# Patient Record
Sex: Male | Born: 1950 | Race: White | Hispanic: No | Marital: Married | Smoking: Former smoker
Health system: Southern US, Community
[De-identification: ages and names within clinical notes are randomized; demographics above are authoritative.]

## PROBLEM LIST (undated history)

## (undated) DIAGNOSIS — Z8674 Personal history of sudden cardiac arrest: Secondary | ICD-10-CM

## (undated) DIAGNOSIS — R7303 Prediabetes: Secondary | ICD-10-CM

## (undated) DIAGNOSIS — Z8669 Personal history of other diseases of the nervous system and sense organs: Secondary | ICD-10-CM

## (undated) DIAGNOSIS — N433 Hydrocele, unspecified: Secondary | ICD-10-CM

## (undated) DIAGNOSIS — I10 Essential (primary) hypertension: Secondary | ICD-10-CM

## (undated) DIAGNOSIS — F1011 Alcohol abuse, in remission: Secondary | ICD-10-CM

## (undated) DIAGNOSIS — G4733 Obstructive sleep apnea (adult) (pediatric): Secondary | ICD-10-CM

## (undated) DIAGNOSIS — K4090 Unilateral inguinal hernia, without obstruction or gangrene, not specified as recurrent: Secondary | ICD-10-CM

## (undated) DIAGNOSIS — Z95810 Presence of automatic (implantable) cardiac defibrillator: Secondary | ICD-10-CM

## (undated) DIAGNOSIS — I213 ST elevation (STEMI) myocardial infarction of unspecified site: Secondary | ICD-10-CM

## (undated) DIAGNOSIS — E8809 Other disorders of plasma-protein metabolism, not elsewhere classified: Secondary | ICD-10-CM

## (undated) DIAGNOSIS — I428 Other cardiomyopathies: Secondary | ICD-10-CM

## (undated) DIAGNOSIS — I252 Old myocardial infarction: Secondary | ICD-10-CM

## (undated) DIAGNOSIS — I219 Acute myocardial infarction, unspecified: Secondary | ICD-10-CM

## (undated) DIAGNOSIS — I5022 Chronic systolic (congestive) heart failure: Secondary | ICD-10-CM

## (undated) DIAGNOSIS — K219 Gastro-esophageal reflux disease without esophagitis: Secondary | ICD-10-CM

## (undated) HISTORY — PX: COLONOSCOPY: SHX174

## (undated) HISTORY — DX: Personal history of sudden cardiac arrest: Z86.74

## (undated) HISTORY — PX: TOTAL KNEE ARTHROPLASTY: SHX125

## (undated) HISTORY — DX: Gastro-esophageal reflux disease without esophagitis: K21.9

## (undated) HISTORY — DX: Other disorders of plasma-protein metabolism, not elsewhere classified: E88.09

## (undated) HISTORY — DX: ST elevation (STEMI) myocardial infarction of unspecified site: I21.3

---
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
## 1981-11-07 HISTORY — PX: MENISCUS REPAIR: SHX5179

## 2015-11-08 HISTORY — PX: CATARACT EXTRACTION W/ INTRAOCULAR LENS IMPLANT: SHX1309

## 2017-06-26 ENCOUNTER — Inpatient Hospital Stay (HOSPITAL_COMMUNITY)
Admission: EM | Admit: 2017-06-26 | Discharge: 2017-07-05 | DRG: 222 | Disposition: A | Discharge status: Rehabilitation Facility | Attending: Cardiovascular Disease | Admitting: Cardiovascular Disease

## 2017-06-26 ENCOUNTER — Inpatient Hospital Stay (HOSPITAL_COMMUNITY)
Admission: EM | Disposition: A | Discharge status: Rehabilitation Facility | Payer: Self-pay | Source: Home / Self Care | Attending: Cardiovascular Disease

## 2017-06-26 ENCOUNTER — Emergency Department (HOSPITAL_COMMUNITY)

## 2017-06-26 ENCOUNTER — Encounter (HOSPITAL_COMMUNITY): Payer: Self-pay | Admitting: Emergency Medicine

## 2017-06-26 DIAGNOSIS — R2689 Other abnormalities of gait and mobility: Secondary | ICD-10-CM | POA: Diagnosis present

## 2017-06-26 DIAGNOSIS — I2119 ST elevation (STEMI) myocardial infarction involving other coronary artery of inferior wall: Principal | ICD-10-CM | POA: Diagnosis present

## 2017-06-26 DIAGNOSIS — R7303 Prediabetes: Secondary | ICD-10-CM

## 2017-06-26 DIAGNOSIS — B9561 Methicillin susceptible Staphylococcus aureus infection as the cause of diseases classified elsewhere: Secondary | ICD-10-CM | POA: Diagnosis present

## 2017-06-26 DIAGNOSIS — I429 Cardiomyopathy, unspecified: Secondary | ICD-10-CM | POA: Diagnosis present

## 2017-06-26 DIAGNOSIS — I5021 Acute systolic (congestive) heart failure: Secondary | ICD-10-CM

## 2017-06-26 DIAGNOSIS — Z79899 Other long term (current) drug therapy: Secondary | ICD-10-CM | POA: Diagnosis not present

## 2017-06-26 DIAGNOSIS — R140 Abdominal distension (gaseous): Secondary | ICD-10-CM | POA: Diagnosis not present

## 2017-06-26 DIAGNOSIS — J029 Acute pharyngitis, unspecified: Secondary | ICD-10-CM | POA: Diagnosis present

## 2017-06-26 DIAGNOSIS — I428 Other cardiomyopathies: Secondary | ICD-10-CM | POA: Diagnosis not present

## 2017-06-26 DIAGNOSIS — J1520 Pneumonia due to staphylococcus, unspecified: Secondary | ICD-10-CM | POA: Diagnosis present

## 2017-06-26 DIAGNOSIS — I472 Ventricular tachycardia: Secondary | ICD-10-CM | POA: Diagnosis present

## 2017-06-26 DIAGNOSIS — G9340 Encephalopathy, unspecified: Secondary | ICD-10-CM | POA: Diagnosis not present

## 2017-06-26 DIAGNOSIS — J9601 Acute respiratory failure with hypoxia: Secondary | ICD-10-CM

## 2017-06-26 DIAGNOSIS — I469 Cardiac arrest, cause unspecified: Secondary | ICD-10-CM | POA: Diagnosis present

## 2017-06-26 DIAGNOSIS — Z9911 Dependence on respirator [ventilator] status: Secondary | ICD-10-CM

## 2017-06-26 DIAGNOSIS — I462 Cardiac arrest due to underlying cardiac condition: Secondary | ICD-10-CM | POA: Diagnosis present

## 2017-06-26 DIAGNOSIS — N179 Acute kidney failure, unspecified: Secondary | ICD-10-CM | POA: Diagnosis present

## 2017-06-26 DIAGNOSIS — R451 Restlessness and agitation: Secondary | ICD-10-CM

## 2017-06-26 DIAGNOSIS — E349 Endocrine disorder, unspecified: Secondary | ICD-10-CM

## 2017-06-26 DIAGNOSIS — F17290 Nicotine dependence, other tobacco product, uncomplicated: Secondary | ICD-10-CM | POA: Diagnosis present

## 2017-06-26 DIAGNOSIS — I4901 Ventricular fibrillation: Secondary | ICD-10-CM | POA: Diagnosis present

## 2017-06-26 DIAGNOSIS — I213 ST elevation (STEMI) myocardial infarction of unspecified site: Secondary | ICD-10-CM | POA: Diagnosis not present

## 2017-06-26 DIAGNOSIS — E8809 Other disorders of plasma-protein metabolism, not elsewhere classified: Secondary | ICD-10-CM

## 2017-06-26 DIAGNOSIS — I5181 Takotsubo syndrome: Secondary | ICD-10-CM | POA: Diagnosis present

## 2017-06-26 DIAGNOSIS — I119 Hypertensive heart disease without heart failure: Secondary | ICD-10-CM | POA: Diagnosis present

## 2017-06-26 DIAGNOSIS — R739 Hyperglycemia, unspecified: Secondary | ICD-10-CM

## 2017-06-26 DIAGNOSIS — Z0189 Encounter for other specified special examinations: Secondary | ICD-10-CM

## 2017-06-26 DIAGNOSIS — E785 Hyperlipidemia, unspecified: Secondary | ICD-10-CM | POA: Diagnosis present

## 2017-06-26 DIAGNOSIS — G931 Anoxic brain damage, not elsewhere classified: Secondary | ICD-10-CM | POA: Diagnosis present

## 2017-06-26 DIAGNOSIS — D72829 Elevated white blood cell count, unspecified: Secondary | ICD-10-CM

## 2017-06-26 DIAGNOSIS — R4189 Other symptoms and signs involving cognitive functions and awareness: Secondary | ICD-10-CM | POA: Diagnosis present

## 2017-06-26 DIAGNOSIS — Z8249 Family history of ischemic heart disease and other diseases of the circulatory system: Secondary | ICD-10-CM | POA: Diagnosis not present

## 2017-06-26 DIAGNOSIS — I10 Essential (primary) hypertension: Secondary | ICD-10-CM | POA: Diagnosis present

## 2017-06-26 DIAGNOSIS — Z800 Family history of malignant neoplasm of digestive organs: Secondary | ICD-10-CM

## 2017-06-26 DIAGNOSIS — I5022 Chronic systolic (congestive) heart failure: Secondary | ICD-10-CM

## 2017-06-26 DIAGNOSIS — R9401 Abnormal electroencephalogram [EEG]: Secondary | ICD-10-CM | POA: Diagnosis present

## 2017-06-26 DIAGNOSIS — Z8674 Personal history of sudden cardiac arrest: Secondary | ICD-10-CM

## 2017-06-26 DIAGNOSIS — Z833 Family history of diabetes mellitus: Secondary | ICD-10-CM | POA: Diagnosis not present

## 2017-06-26 DIAGNOSIS — E876 Hypokalemia: Secondary | ICD-10-CM | POA: Diagnosis present

## 2017-06-26 DIAGNOSIS — J150 Pneumonia due to Klebsiella pneumoniae: Secondary | ICD-10-CM | POA: Diagnosis not present

## 2017-06-26 DIAGNOSIS — J189 Pneumonia, unspecified organism: Secondary | ICD-10-CM | POA: Diagnosis not present

## 2017-06-26 DIAGNOSIS — Y92230 Patient room in hospital as the place of occurrence of the external cause: Secondary | ICD-10-CM | POA: Diagnosis not present

## 2017-06-26 DIAGNOSIS — R001 Bradycardia, unspecified: Secondary | ICD-10-CM | POA: Diagnosis not present

## 2017-06-26 DIAGNOSIS — Z452 Encounter for adjustment and management of vascular access device: Secondary | ICD-10-CM

## 2017-06-26 DIAGNOSIS — I252 Old myocardial infarction: Secondary | ICD-10-CM

## 2017-06-26 DIAGNOSIS — I959 Hypotension, unspecified: Secondary | ICD-10-CM | POA: Diagnosis present

## 2017-06-26 DIAGNOSIS — G629 Polyneuropathy, unspecified: Secondary | ICD-10-CM

## 2017-06-26 DIAGNOSIS — Z95810 Presence of automatic (implantable) cardiac defibrillator: Secondary | ICD-10-CM | POA: Diagnosis not present

## 2017-06-26 DIAGNOSIS — R680 Hypothermia, not associated with low environmental temperature: Secondary | ICD-10-CM | POA: Diagnosis not present

## 2017-06-26 DIAGNOSIS — Y95 Nosocomial condition: Secondary | ICD-10-CM | POA: Diagnosis present

## 2017-06-26 DIAGNOSIS — J020 Streptococcal pharyngitis: Secondary | ICD-10-CM | POA: Diagnosis not present

## 2017-06-26 DIAGNOSIS — R5381 Other malaise: Secondary | ICD-10-CM | POA: Diagnosis present

## 2017-06-26 DIAGNOSIS — K219 Gastro-esophageal reflux disease without esophagitis: Secondary | ICD-10-CM | POA: Diagnosis present

## 2017-06-26 DIAGNOSIS — I6782 Cerebral ischemia: Secondary | ICD-10-CM | POA: Diagnosis not present

## 2017-06-26 DIAGNOSIS — Z959 Presence of cardiac and vascular implant and graft, unspecified: Secondary | ICD-10-CM

## 2017-06-26 DIAGNOSIS — E878 Other disorders of electrolyte and fluid balance, not elsewhere classified: Secondary | ICD-10-CM | POA: Diagnosis present

## 2017-06-26 DIAGNOSIS — T50995A Adverse effect of other drugs, medicaments and biological substances, initial encounter: Secondary | ICD-10-CM | POA: Diagnosis not present

## 2017-06-26 HISTORY — DX: Personal history of sudden cardiac arrest: Z86.74

## 2017-06-26 HISTORY — PX: LEFT HEART CATH AND CORONARY ANGIOGRAPHY: CATH118249

## 2017-06-26 HISTORY — DX: Old myocardial infarction: I25.2

## 2017-06-26 HISTORY — DX: Other disorders of plasma-protein metabolism, not elsewhere classified: E88.09

## 2017-06-26 HISTORY — DX: Ventricular fibrillation: I49.01

## 2017-06-26 HISTORY — DX: Monoclonal gammopathy: E34.9

## 2017-06-26 HISTORY — DX: Cardiac arrest, cause unspecified: I46.9

## 2017-06-26 HISTORY — DX: Essential (primary) hypertension: I10

## 2017-06-26 LAB — SAMPLE TO BLOOD BANK

## 2017-06-26 LAB — LIPID PANEL
CHOL/HDL RATIO: 2.5 ratio
Cholesterol: 142 mg/dL (ref 0–200)
HDL: 56 mg/dL (ref >40)
LDL Cholesterol: UNDETERMINED mg/dL (ref 0–99)
Triglycerides: 505 mg/dL — ABNORMAL HIGH (ref <150)
VLDL: UNDETERMINED mg/dL (ref 0–40)

## 2017-06-26 LAB — CBC WITH DIFFERENTIAL/PLATELET
BASOS PCT: 0 %
Basophils Absolute: 0 10*3/uL (ref 0.0–0.1)
Eosinophils Absolute: 0.1 10*3/uL (ref 0.0–0.7)
Eosinophils Relative: 1 %
HEMATOCRIT: 45.4 % (ref 39.0–52.0)
HEMOGLOBIN: 14.9 g/dL (ref 13.0–17.0)
Lymphocytes Relative: 31 %
Lymphs Abs: 3.9 10*3/uL (ref 0.7–4.0)
MCH: 29.1 pg (ref 26.0–34.0)
MCHC: 32.8 g/dL (ref 30.0–36.0)
MCV: 88.7 fL (ref 78.0–100.0)
MONOS PCT: 7 %
Monocytes Absolute: 0.9 10*3/uL (ref 0.1–1.0)
NEUTROS ABS: 7.5 10*3/uL (ref 1.7–7.7)
NEUTROS PCT: 61 %
Platelets: 242 10*3/uL (ref 150–400)
RBC: 5.12 MIL/uL (ref 4.22–5.81)
RDW: 13.4 % (ref 11.5–15.5)
WBC: 12.4 10*3/uL — ABNORMAL HIGH (ref 4.0–10.5)

## 2017-06-26 LAB — APTT: APTT: 20 s — AB (ref 24–36)

## 2017-06-26 LAB — COMPREHENSIVE METABOLIC PANEL
ALBUMIN: 3.6 g/dL (ref 3.5–5.0)
ALT: 134 U/L — ABNORMAL HIGH (ref 17–63)
AST: 218 U/L — AB (ref 15–41)
Alkaline Phosphatase: 75 U/L (ref 38–126)
Anion gap: 22 — ABNORMAL HIGH (ref 5–15)
BUN: 22 mg/dL — AB (ref 6–20)
CHLORIDE: 98 mmol/L — AB (ref 101–111)
CO2: 17 mmol/L — ABNORMAL LOW (ref 22–32)
Calcium: 8 mg/dL — ABNORMAL LOW (ref 8.9–10.3)
Creatinine, Ser: 1.6 mg/dL — ABNORMAL HIGH (ref 0.61–1.24)
GFR calc Af Amer: 51 mL/min — ABNORMAL LOW (ref >60)
GFR calc non Af Amer: 44 mL/min — ABNORMAL LOW (ref >60)
GLUCOSE: 254 mg/dL — AB (ref 65–99)
POTASSIUM: 3.4 mmol/L — AB (ref 3.5–5.1)
SODIUM: 137 mmol/L (ref 135–145)
Total Bilirubin: 1 mg/dL (ref 0.3–1.2)
Total Protein: 5.9 g/dL — ABNORMAL LOW (ref 6.5–8.1)

## 2017-06-26 LAB — I-STAT CG4 LACTIC ACID, ED: LACTIC ACID, VENOUS: 11.42 mmol/L — AB (ref 0.5–1.9)

## 2017-06-26 LAB — POCT I-STAT 3, ART BLOOD GAS (G3+)
Acid-base deficit: 1 mmol/L (ref 0.0–2.0)
BICARBONATE: 24.2 mmol/L (ref 20.0–28.0)
O2 Saturation: 99 %
PCO2 ART: 34.2 mmHg (ref 32.0–48.0)
Patient temperature: 90
TCO2: 25 mmol/L (ref 0–100)
pH, Arterial: 7.436 (ref 7.350–7.450)
pO2, Arterial: 109 mmHg — ABNORMAL HIGH (ref 83.0–108.0)

## 2017-06-26 LAB — I-STAT CHEM 8, ED
BUN: 24 mg/dL — AB (ref 6–20)
CALCIUM ION: 0.91 mmol/L — AB (ref 1.15–1.40)
Chloride: 99 mmol/L — ABNORMAL LOW (ref 101–111)
Creatinine, Ser: 1.4 mg/dL — ABNORMAL HIGH (ref 0.61–1.24)
GLUCOSE: 251 mg/dL — AB (ref 65–99)
HCT: 46 % (ref 39.0–52.0)
Hemoglobin: 15.6 g/dL (ref 13.0–17.0)
Potassium: 3.1 mmol/L — ABNORMAL LOW (ref 3.5–5.1)
SODIUM: 136 mmol/L (ref 135–145)
TCO2: 18 mmol/L (ref 0–100)

## 2017-06-26 LAB — I-STAT TROPONIN, ED: Troponin i, poc: 0.16 ng/mL (ref 0.00–0.08)

## 2017-06-26 LAB — PROTIME-INR
INR: 1.14
Prothrombin Time: 14.6 seconds (ref 11.4–15.2)

## 2017-06-26 LAB — POCT ACTIVATED CLOTTING TIME: ACTIVATED CLOTTING TIME: 98 s

## 2017-06-26 LAB — TROPONIN I: Troponin I: 0.13 ng/mL (ref <0.03)

## 2017-06-26 SURGERY — LEFT HEART CATH AND CORONARY ANGIOGRAPHY
Anesthesia: LOCAL

## 2017-06-26 MED ORDER — CISATRACURIUM BOLUS VIA INFUSION
0.1000 mg/kg | Freq: Once | INTRAVENOUS | Status: AC
  Administered 2017-06-26: 7 mg via INTRAVENOUS
  Filled 2017-06-26: qty 7

## 2017-06-26 MED ORDER — ASPIRIN 300 MG RE SUPP: 300.0000 mg | RECTAL | Status: AC

## 2017-06-26 MED ORDER — HEPARIN SODIUM (PORCINE) 5000 UNIT/ML IJ SOLN
4000.0000 [IU] | Freq: Once | INTRAMUSCULAR | Status: AC
  Administered 2017-06-26: 4000 [IU] via INTRAVENOUS
  Filled 2017-06-26: qty 1

## 2017-06-26 MED ORDER — NOREPINEPHRINE BITARTRATE 1 MG/ML IV SOLN
0.0000 ug/min | INTRAVENOUS | Status: DC
  Administered 2017-06-27: 2 ug/min via INTRAVENOUS
  Filled 2017-06-26: qty 4

## 2017-06-26 MED ORDER — ARTIFICIAL TEARS OPHTHALMIC OINT
1.0000 "application " | TOPICAL_OINTMENT | Freq: Three times a day (TID) | OPHTHALMIC | Status: DC
  Administered 2017-06-27 – 2017-06-28 (×5): 1 via OPHTHALMIC
  Filled 2017-06-26 (×2): qty 3.5

## 2017-06-26 MED ORDER — PROPOFOL 1000 MG/100ML IV EMUL
INTRAVENOUS | Status: AC
  Filled 2017-06-26: qty 100

## 2017-06-26 MED ORDER — ASPIRIN 300 MG RE SUPP
300.0000 mg | Freq: Once | RECTAL | Status: AC
  Administered 2017-06-26: 300 mg via RECTAL

## 2017-06-26 MED ORDER — FENTANYL BOLUS VIA INFUSION
50.0000 ug | INTRAVENOUS | Status: DC | PRN
Start: 2017-06-26 — End: 2017-06-28
  Filled 2017-06-26: qty 50

## 2017-06-26 MED ORDER — AMIODARONE HCL IN DEXTROSE 360-4.14 MG/200ML-% IV SOLN
60.0000 mg/h | INTRAVENOUS | Status: AC
  Administered 2017-06-27: 60 mg/h via INTRAVENOUS
  Filled 2017-06-26: qty 200

## 2017-06-26 MED ORDER — CISATRACURIUM BOLUS VIA INFUSION
0.0500 mg/kg | INTRAVENOUS | Status: DC | PRN
  Filled 2017-06-26: qty 4

## 2017-06-26 MED ORDER — PANTOPRAZOLE SODIUM 40 MG IV SOLR
40.0000 mg | Freq: Every day | INTRAVENOUS | Status: DC
  Administered 2017-06-27 – 2017-07-01 (×5): 40 mg via INTRAVENOUS
  Filled 2017-06-26 (×5): qty 40

## 2017-06-26 MED ORDER — IOPAMIDOL (ISOVUE-370) INJECTION 76%
INTRAVENOUS | Status: DC | PRN
  Administered 2017-06-26: 100 mL via INTRA_ARTERIAL

## 2017-06-26 MED ORDER — FENTANYL 2500MCG IN NS 250ML (10MCG/ML) PREMIX INFUSION
100.0000 ug/h | INTRAVENOUS | Status: DC
  Administered 2017-06-26: 100 ug/h via INTRAVENOUS
  Administered 2017-06-27 – 2017-06-28 (×4): 300 ug/h via INTRAVENOUS
  Filled 2017-06-26 (×5): qty 250

## 2017-06-26 MED ORDER — HEPARIN (PORCINE) IN NACL 2-0.9 UNIT/ML-% IJ SOLN
INTRAMUSCULAR | Status: AC
  Filled 2017-06-26: qty 1000

## 2017-06-26 MED ORDER — SODIUM CHLORIDE 0.9 % IV SOLN
1.0000 g | Freq: Once | INTRAVENOUS | Status: AC
  Administered 2017-06-27: 1 g via INTRAVENOUS
  Filled 2017-06-26 (×2): qty 10

## 2017-06-26 MED ORDER — MIDAZOLAM HCL 2 MG/2ML IJ SOLN
2.0000 mg | Freq: Once | INTRAMUSCULAR | Status: AC
  Administered 2017-06-28: 2 mg via INTRAVENOUS
  Filled 2017-06-26: qty 2

## 2017-06-26 MED ORDER — IOPAMIDOL (ISOVUE-370) INJECTION 76%
INTRAVENOUS | Status: AC
  Filled 2017-06-26: qty 125

## 2017-06-26 MED ORDER — LIDOCAINE HCL (PF) 1 % IJ SOLN
INTRAMUSCULAR | Status: DC | PRN
  Administered 2017-06-26: 15 mL via SUBCUTANEOUS

## 2017-06-26 MED ORDER — SODIUM CHLORIDE 0.9 % IV SOLN
INTRAVENOUS | Status: DC
  Administered 2017-06-26 – 2017-06-27 (×2): via INTRAVENOUS

## 2017-06-26 MED ORDER — SODIUM CHLORIDE 0.9 % IV SOLN
1.0000 ug/kg/min | INTRAVENOUS | Status: DC
  Administered 2017-06-26: 1 ug/kg/min via INTRAVENOUS
  Filled 2017-06-26: qty 20

## 2017-06-26 MED ORDER — PROPOFOL 1000 MG/100ML IV EMUL
5.0000 ug/kg/min | Freq: Once | INTRAVENOUS | Status: DC
  Administered 2017-06-26: 30 ug/kg/min via INTRAVENOUS

## 2017-06-26 MED ORDER — LIDOCAINE HCL (PF) 1 % IJ SOLN
INTRAMUSCULAR | Status: AC
  Filled 2017-06-26: qty 30

## 2017-06-26 MED ORDER — MIDAZOLAM HCL 50 MG/10ML IJ SOLN
2.0000 mg/h | INTRAMUSCULAR | Status: DC
  Administered 2017-06-26 – 2017-06-28 (×5): 5 mg/h via INTRAVENOUS
  Filled 2017-06-26 (×5): qty 10

## 2017-06-26 MED ORDER — AMIODARONE LOAD VIA INFUSION
150.0000 mg | Freq: Once | INTRAVENOUS | Status: DC
  Filled 2017-06-26: qty 83.34

## 2017-06-26 MED ORDER — FENTANYL CITRATE (PF) 100 MCG/2ML IJ SOLN: 100.0000 ug | Freq: Once | INTRAMUSCULAR | Status: DC

## 2017-06-26 MED ORDER — POTASSIUM CHLORIDE 10 MEQ/100ML IV SOLN
10.0000 meq | INTRAVENOUS | Status: AC
  Administered 2017-06-27 (×2): 10 meq via INTRAVENOUS
  Filled 2017-06-26 (×2): qty 100

## 2017-06-26 MED ORDER — AMIODARONE HCL IN DEXTROSE 360-4.14 MG/200ML-% IV SOLN
30.0000 mg/h | INTRAVENOUS | Status: DC
  Administered 2017-06-27 – 2017-06-28 (×2): 30 mg/h via INTRAVENOUS
  Filled 2017-06-26 (×3): qty 200

## 2017-06-26 MED ORDER — MIDAZOLAM BOLUS VIA INFUSION
2.0000 mg | INTRAVENOUS | Status: DC | PRN
  Filled 2017-06-26: qty 2

## 2017-06-26 SURGICAL SUPPLY — 12 items
CATH INFINITI 5FR MULTPACK ANG (CATHETERS) ×2 IMPLANT
GLIDESHEATH SLEND SS 6F .021 (SHEATH) IMPLANT
GUIDEWIRE INQWIRE 1.5J.035X260 (WIRE) IMPLANT
INQWIRE 1.5J .035X260CM (WIRE)
KIT ENCORE 26 ADVANTAGE (KITS) ×2 IMPLANT
KIT HEART LEFT (KITS) ×2 IMPLANT
PACK CARDIAC CATHETERIZATION (CUSTOM PROCEDURE TRAY) ×2 IMPLANT
SHEATH PINNACLE 6F 10CM (SHEATH) ×2 IMPLANT
SYR MEDRAD MARK V 150ML (SYRINGE) ×2 IMPLANT
TRANSDUCER W/STOPCOCK (MISCELLANEOUS) ×2 IMPLANT
TUBING CIL FLEX 10 FLL-RA (TUBING) ×2 IMPLANT
WIRE EMERALD 3MM-J .035X150CM (WIRE) ×2 IMPLANT

## 2017-06-26 NOTE — ED Notes (Signed)
Per wife hx of HTN, non smoker.

## 2017-06-26 NOTE — Progress Notes (Signed)
eLink Physician-Brief Progress Note Patient Name: MANASSEH PITTSLEY DOB: [birth year] MRN: [id number]   Date of Service  06/26/2017  HPI/Events of Note  Arrest 20 min , VT Lactic concerning Unsure if any purposeful movements clinically  stemi To lab Unable to camera in  pccm saw Cooling candidate D/w 2900 Rn to start cooling simultaneous   eICU Interventions  Orders placed     Intervention Category Evaluation Type: New Patient Evaluation  Raylene Miyamoto. 06/26/2017, 10:14 PM

## 2017-06-26 NOTE — ED Notes (Signed)
Arrives as a code stemi from home, pt went to bed early c/o cp. Wife called 911 after hearing a "suspicious sound" and found him with agonal respirations unresponsive in bed. Fire shocked once with AED, upon EMS arrival pt in PEA at a rate of 20. Received CPR for 20 minutes on and off. VFIB noted, shocked once at 300 joules. Then vtach without pulses shocked once at 360 joules. Pulses back in sinus tach. 7.0 ETT. Recieved 5 epis and 5mg  versed in the field.

## 2017-06-26 NOTE — ED Notes (Signed)
Cath lab ready; awaiting RT for transport

## 2017-06-26 NOTE — H&P (Signed)
Cardiology Admission History and Physical:   Patient ID: XZAVIEN HARADA; MRN: [id number]; DOB: 31-Oct-1951   Admission date: 06/26/2017  Primary Care Provider: No primary care provider on file. Primary Cardiologist: New  Chief Complaint:  Cardiac arrest  Patient Profile:   Jeffery Thomas is a 66 y.o. male with a history of HTN, hyperlipidemia, and GERD had chest pain then cardiac arrest.   History of Present Illness:   Jeffery Thomas has no prior cardiac history.  He has treated hypertension, hyperlipidemia, and GERD.  He was in his usual state of health until after dinner this evening.  He reported chest pain to his wife and went back to the bedroom.  She heard him fall, found him down and unresponsive.  EMS was summoned, AED applied and he was shocked.  He was then found to be in PEA and got epinephrine.  He went into vfib again and was shocked, then pulseless VT and was shocked again.  Finally attained perfusing rhythm with sinus tachy after about 20 minutes of CPR and treatment.  In ER currently, SBP in 130s with HR 100s sinus tachy.  He apparently had purposeful movement after ROSC but is now on propofol/sedated.    Per wife, he had not had chest pain prior to this evening.  He does not smoke.   PMH: 1. HTN 2. Hyperlipidemia 3. GERD  No past surgical history on file.   Medications Prior to Admission: Omeprazole 20 Toprol XL 25 daily Atorvastatin 40 daily HCTZ 25 daily Amlodipine 10 daily       Allergies:   No Known Allergies  Social History:   Social History   Social History  . Marital status: N/A    Spouse name: N/A  . Number of children: N/A  . Years of education: N/A   Occupational History  . Not on file.   Social History Main Topics  . Smoking status: Never Smoker  . Smokeless tobacco: Not on file  . Alcohol use Not on file  . Drug use: Unknown  . Sexual activity: Not on file   Other Topics Concern  . Not on file   Social History Narrative  . No  narrative on file    Family History:  CAD  ROS:  All systems reviewed and negative except as per HPI.      Physical Exam/Data:   Vitals:   06/26/17 2146 06/26/17 2151  BP: (!) 133/114   Pulse: (!) 104   Resp: 20   Temp:  (!) 96.3 F (35.7 C)  TempSrc:  Temporal  SpO2: 98%     Intake/Output Summary (Last 24 hours) at 06/26/17 2158 Last data filed at 06/26/17 2140  Gross per 24 hour  Intake              900 ml  Output                0 ml  Net              900 ml   There were no vitals filed for this visit. There is no height or weight on file to calculate BMI.  General:  Intubated/sedated HEENT: normal Lymph: no adenopathy Neck: no JVD Endocrine:  No thryomegaly Vascular: No carotid bruits; FA pulses 2+ bilaterally without bruits  Cardiac:  normal S1, S2; RRR; no murmur  Lungs:  Coarse BS bilaterally Abd: soft, nontender, no hepatomegaly  Ext: no edema Musculoskeletal:  No deformities, BUE and BLE strength normal  and equal Skin: warm and dry  Neuro:  Sedated on vent   EKG:  The ECG that was done was personally reviewed and demonstrates NSR with 1 mm inferior ST elevation and anterolateral T wave inversions  Laboratory Data:  Chemistry Recent Labs Lab 06/26/17 2154  NA 136  K 3.1*  CL 99*  GLUCOSE 251*  BUN 24*  CREATININE 1.40*    No results for input(s): PROT, ALBUMIN, AST, ALT, ALKPHOS, BILITOT in the last 168 hours. Hematology Recent Labs Lab 06/26/17 2154  HGB 15.6  HCT 46.0   Cardiac EnzymesNo results for input(s): TROPONINI in the last 168 hours. No results for input(s): TROPIPOC in the last 168 hours.  BNPNo results for input(s): BNP, PROBNP in the last 168 hours.  DDimer No results for input(s): DDIMER in the last 168 hours.  Radiology/Studies:  No results found.  Assessment and Plan:   66 yo with history of HTN, hyperlipidemia, GERD presents with inferior MI complicated by ventricular fibrillation arrest.  1. Inferior MI: No prior  cardiac history.  He will get ASA per rectum and go directly to cath lab for angiography.  Will need echo.  2. Cardiac arrest: Ventricular fibrillation.  About 20 minutes total CPR with ROSC.  He will be cooled, CCM consulted.     Signed, Loralie Champagne, MD  06/26/2017 9:58 PM

## 2017-06-26 NOTE — ED Notes (Signed)
Scrotum edematous on exam.

## 2017-06-26 NOTE — Progress Notes (Signed)
eLink Physician-Brief Progress Note Patient Name: Jeffery Thomas DOB: 07-Jun-1951 MRN: [id number]   Date of Service  06/26/2017  HPI/Events of Note  Bedside nurse requests A-line and CVL.  eICU Interventions  Will order: 1. Respiratory Therapy to place A-line. 2. Will inform ground team of request for CVL.     Intervention Category Major Interventions: Hypotension - evaluation and management  Yvana Samonte Cornelia Copa 06/26/2017, 11:57 PM

## 2017-06-26 NOTE — ED Notes (Signed)
Artic sun pads applied per cardiologist

## 2017-06-26 NOTE — Consult Note (Signed)
PULMONARY / CRITICAL CARE MEDICINE   Name: Jeffery Thomas MRN: [id number] DOB: 01-19-1951    ADMISSION DATE:  06/26/2017 CONSULTATION DATE:  06/26/17  REFERRING MD:  Aundra Dubin  CHIEF COMPLAINT:  Cardiac Arrest  HISTORY OF PRESENT ILLNESS:  Pt is encephelopathic; therefore, this HPI is obtained from chart review. Jeffery Thomas is a 66 y.o. male with PMH as outlined below. He presented to [facility] ED 8/20 after cardiac arrest.  He was in his usual state of health until he finished dinner.  He then reported chest pain and when he went back to his bedroom, wife heard a noise.  She found him down and unresponsive. EMS was called and pt was initially AED advised to shock.  He was then found to be in PEA.  After epi, he went into v.fib requiring a shock, then VT requiring 3rd shock.  Total ACLS time roughly 20 minutes.  In ED, he was found to have inferior STEMI.  There was question whether pt had purposeful movement after intubation, or whether it was simply pt reaching unpurposefully.  PAST MEDICAL HISTORY :  He  has a past medical history of Hypertension.  PAST SURGICAL HISTORY: He  has no past surgical history on file.  No Known Allergies  No current facility-administered medications on file prior to encounter.    No current outpatient prescriptions on file prior to encounter.    FAMILY HISTORY:  His has no family status information on file.    SOCIAL HISTORY: He  reports that he has never smoked. He does not have any smokeless tobacco history on file.  REVIEW OF SYSTEMS:   Unable to obtain as pt is encephalopathic.  SUBJECTIVE:  On vent, unresponsive.  VITAL SIGNS: BP 90/64   Pulse (!) 102   Temp (!) 96.3 F (35.7 C) (Temporal)   Resp (!) 24   Ht 6\' 2"  (1.88 m)   Wt 70 kg (154 lb 5.2 oz)   SpO2 96%   BMI 19.81 kg/m   HEMODYNAMICS:    VENTILATOR SETTINGS: Vent Mode: PRVC FiO2 (%):  [100 %] 100 % Set Rate:  [15 bmp] 15 bmp Vt Set:  [640 mL] 640 mL PEEP:  [5 cmH20]  5 cmH20  INTAKE / OUTPUT: No intake/output data recorded.   PHYSICAL EXAMINATION: General: Adult male, in NAD. Neuro: Sedated, unresponsive. HEENT: [place]/AT. PERRL, sclerae anicteric. Cardiovascular: RRR, no M/R/G.  Lungs: Respirations even and unlabored.  CTA bilaterally, No W/R/R. Abdomen: BS x 4, soft, NT/ND.  Musculoskeletal: No gross deformities, no edema.  Skin: Intact, warm, no rashes.  LABS:  BMET  Recent Labs Lab 06/26/17 2154  NA 136  K 3.1*  CL 99*  BUN 24*  CREATININE 1.40*  GLUCOSE 251*    Electrolytes No results for input(s): CALCIUM, MG, PHOS in the last 168 hours.  CBC  Recent Labs Lab 06/26/17 2137 06/26/17 2154  WBC 12.4*  --   HGB 14.9 15.6  HCT 45.4 46.0  PLT 242  --     Coag's  Recent Labs Lab 06/26/17 2137  APTT 20*  INR 1.14    Sepsis Markers  Recent Labs Lab 06/26/17 2154  LATICACIDVEN 11.42*    ABG No results for input(s): PHART, PCO2ART, PO2ART in the last 168 hours.  Liver Enzymes No results for input(s): AST, ALT, ALKPHOS, BILITOT, ALBUMIN in the last 168 hours.  Cardiac Enzymes No results for input(s): TROPONINI, PROBNP in the last 168 hours.  Glucose No results for input(s): GLUCAP  in the last 168 hours.  Imaging Dg Chest Portable 1 View  Result Date: 06/26/2017 CLINICAL DATA:  66 y/o  M; post CPR and intubation. EXAM: PORTABLE CHEST 1 VIEW COMPARISON:  None. FINDINGS: Mildly enlarged cardiac silhouette given projection and technique. Endotracheal tube tip is 9.5 cm from the carina. Pulmonary vascular congestion. No pneumothorax. The no focal consolidation. No acute osseous abnormality is evident. IMPRESSION: Mildly enlarged cardiac silhouette. Pulmonary vascular congestion. Endotracheal tube tip 9.5 cm from carina, consider advancement. Electronically Signed   By: Kristine Garbe M.D.   On: 06/26/2017 22:11     STUDIES:  CXR 8/20 > mild congestion. Echo 8/21 >  EEG 8/21 >    CULTURES: None.  ANTIBIOTICS: None.  SIGNIFICANT EVENTS: 8/20 > admit.  LINES/TUBES: ETT 8/20 >  CVL pending 8/20 >  A. Line pending 8/20 >   DISCUSSION: 66 y.o. male admitted 8/20 with inferior STEMI.  Taken to cath lab and started on hypothermia.  ASSESSMENT / PLAN:  PULMONARY A: Respiratory insufficiency - due to inability to protect the airway in the setting of inferior STEMI. P:   Full vent support. Wean as able. VAP prevention measures. Hold SBT until off NMB. CXR in AM.  CARDIOVASCULAR A:  Inferior STEMI - going to cath lab. Hx HTN. P:  Start TTM, goal 33 degrees. Place CVL, arterial line. Levophed PRN, goal MAP > 80 while undergoing TTM. Cardiology following. Trend troponin, lactate. Assess echo, CVP. Continue preadmission atorvastatin. Hold preadmission amlodipine, hydrodiuril, toprol-xl.  RENAL A:   Hypokalemia - anticipate worsening due to cold diuresis. AKI. Hypocalcemia. P:   Continue K repletion. NS @ 75.  BMP q2hrs x 4 then q4hrs.  GASTROINTESTINAL A:   GI prophylaxis. Nutrition. P:   SUP: Pantoprazole. NPO. Start TF's in AM.  HEMATOLOGIC A:   VTE Prophylaxis. P:  SCD's / heparin. Coags q8hrs x 2. CBC in AM.  INFECTIOUS A:   No indication of infection. P:   Monitor clinically.  ENDOCRINE A:   At risk for hyperglycemia during cooling. P:   ICU hyperglycemia protocol.  NEUROLOGIC A:   Acute encephalopathy. At risk for anoxic brain injury. P:   Sedation:  Propofol gtt / Fentanyl gtt / Cisatracurium gtt / Midazolam PRN. RASS goal: -5 while under NMB. Hold daily WUA until off NMB. Assess EEG. Neuro consult once rewarmed.  Family updated: None available.  Interdisciplinary Family Meeting v Palliative Care Meeting:  Due by: 07/03/17.  CC time: 40 min.   Montey Hora, [facility] Pager: [phone]  or [phone] 06/26/2017, 10:33 PM

## 2017-06-26 NOTE — ED Provider Notes (Signed)
Jeffery Thomas DEPT Provider Note   CSN: [id number] Arrival date & time: 06/26/17  2131     History   Chief Complaint Chief Complaint  Patient presents with  . Code STEMI    HPI Jeffery Thomas is a 66 y.o. male.  HPI  The patient is a 66 year old male, he has a known history of hypertension per the paramedics, this is according to his wife who was on scene. She reported to the paramedics that the patient was feeling some "acid reflux symptoms" shortly after which he went unresponsive and was making strange noises from the bedroom. When she found him he had some gurgling and was unresponsive. Initial firefighters on the scene hooked up their AED and were told to shock, the shock converted him to a bradycardic PE A, he then regained sinus rhythm with epinephrine and in route to the hospital went into ventricular tachycardia without a pulse. He was shocked successfully and went into a normal sinus rhythm with a narrow complex rhythm. His initial EKG as encouraged showed ST elevations in the inferior leads, the patient did not regain consciousness but did have some spontaneous breathing despite being intubated on the scene.  There is no other information available at this time because the patient is unresponsive and there is no family to corroborate the story.  We are unsure about family history, surgical history or other medical history including smoking.  No past medical history on file.  There are no active problems to display for this patient.   No past surgical history on file.     Home Medications    Prior to Admission medications   Not on File    Family History No family history on file.  Social History Social History  Substance Use Topics  . Smoking status: Not on file  . Smokeless tobacco: Not on file  . Alcohol use Not on file     Allergies   Patient has no allergy information on record.   Review of Systems Review of Systems  Unable to perform ROS:  Patient unresponsive     Physical Exam Updated Vital Signs There were no vitals taken for this visit.  Physical Exam  Constitutional: He appears well-developed and well-nourished. He appears distressed.  HENT:  Head: Normocephalic and atraumatic.  Mouth/Throat: Oropharynx is clear and moist. No oropharyngeal exudate.  Eyes: Pupils are equal, round, and reactive to light. Conjunctivae are normal. Right eye exhibits no discharge. Left eye exhibits no discharge. No scleral icterus.  Neck: Normal range of motion. Neck supple. No JVD present. No thyromegaly present.  Cardiovascular: Normal rate, regular rhythm, normal heart sounds and intact distal pulses.  Exam reveals no gallop and no friction rub.   No murmur heard. Pulmonary/Chest: Effort normal and breath sounds normal. No respiratory distress. He has no wheezes. He has no rales.  Abdominal: Soft. Bowel sounds are normal. He exhibits no distension and no mass. There is no tenderness.  Musculoskeletal: Normal range of motion. He exhibits no edema or tenderness.  Lymphadenopathy:    He has no cervical adenopathy.  Neurological: He is alert. Coordination normal.  The patient is obtunded, he does have some response to painful stimuli, he has some tone in his bilateral arms, he is does not have purposeful movements, he does have some blinking of the eyes, he is breathing just above the ventilator  Skin: Skin is warm and dry. No rash noted. No erythema.  Psychiatric: He has a normal mood and affect. His  behavior is normal.  Nursing note and vitals reviewed.    ED Treatments / Results  Labs (all labs ordered are listed, but only abnormal results are displayed) Labs Reviewed  CBC WITH DIFFERENTIAL/PLATELET  PROTIME-INR  APTT  COMPREHENSIVE METABOLIC PANEL  TROPONIN I  LIPID PANEL  I-STAT CHEM 8, ED  I-STAT CG4 LACTIC ACID, ED  I-STAT TROPONIN, ED  SAMPLE TO BLOOD BANK    Radiology No results found.  Procedures Procedures  (including critical care time)  CRITICAL CARE Performed by: Johnna Acosta Total critical care time: 35 minutes Critical care time was exclusive of separately billable procedures and treating other patients. Critical care was necessary to treat or prevent imminent or life-threatening deterioration. Critical care was time spent personally by me on the following activities: development of treatment plan with patient and/or surrogate as well as nursing, discussions with consultants, evaluation of patient's response to treatment, examination of patient, obtaining history from patient or surrogate, ordering and performing treatments and interventions, ordering and review of laboratory studies, ordering and review of radiographic studies, pulse oximetry and re-evaluation of patient's condition.   Medications Ordered in ED Medications  propofol (DIPRIVAN) 1000 MG/100ML infusion (not administered)  heparin injection 4,000 Units (4,000 Units Intravenous Given 06/26/17 2141)  propofol (DIPRIVAN) 1000 MG/100ML infusion (30 mcg/kg/min Intravenous New Bag/Given 06/26/17 2141)     Initial Impression / Assessment and Plan / ED Course  I have reviewed the triage vital signs and the nursing notes.  Pertinent labs & imaging results that were available during my care of the patient were reviewed by me and considered in my medical decision making (see chart for details).  The patient does present already intubated, multiple consultations were requested including cardiology and critical care, the patient will go to the intensive care unit.    there is significant concern that this patient has had a STEMI, his initial EKG did show this however the repeat EKG on arrival does not show STEMI. He does have some abnormal ST segmentsin the inferior leads as well as deep T-wave inversions in the lateral precordial leads which are very concerning and thus I called the cardiologist, Dr. Gwenlyn Found. Dr. Gwenlyn Found will take the  patient to the heart catheterization lab. I paged critical care and discussed the case with them, they will admit the patient to the ICU. The patient is critically ill, we are considering cooling, he has been given heparin by the pharmacist, currently he has a normal sinus rhythm  ED ECG REPORT  I personally interpreted this EKG   Date: 06/26/2017   Rate: 106  Rhythm: sinus tachycardia  QRS Axis: normal  Intervals: normal  ST/T Wave abnormalities: nonspecific ST/T changes  Conduction Disutrbances:none  Narrative Interpretation:   Old EKG Reviewed: none available    Final Clinical Impressions(s) / ED Diagnoses   Final diagnoses:  ST elevation myocardial infarction (STEMI), unspecified artery ([place])  Ventricular fibrillation [facility])    New Prescriptions New Prescriptions   No medications on file     Noemi Chapel, MD 06/26/17 2151

## 2017-06-26 NOTE — ED Notes (Signed)
Ready for cath lab, wife at bedside

## 2017-06-26 NOTE — ED Notes (Signed)
Pt enroute to cath lab

## 2017-06-26 NOTE — Progress Notes (Signed)
Pt brought in by EMS already tubed with pulses present. Placed on our vent.  Evaluated by MD.  Cath lab being contacted at this time.

## 2017-06-27 ENCOUNTER — Inpatient Hospital Stay (HOSPITAL_COMMUNITY)

## 2017-06-27 ENCOUNTER — Encounter (HOSPITAL_COMMUNITY): Payer: Self-pay | Admitting: Cardiovascular Disease

## 2017-06-27 DIAGNOSIS — Z452 Encounter for adjustment and management of vascular access device: Secondary | ICD-10-CM

## 2017-06-27 LAB — BASIC METABOLIC PANEL
ANION GAP: 14 (ref 5–15)
ANION GAP: 13 (ref 5–15)
ANION GAP: 10 (ref 5–15)
ANION GAP: 10 (ref 5–15)
BUN: 21 mg/dL — AB (ref 6–20)
BUN: 18 mg/dL (ref 6–20)
BUN: 16 mg/dL (ref 6–20)
BUN: 12 mg/dL (ref 6–20)
CALCIUM: 7.8 mg/dL — AB (ref 8.9–10.3)
CHLORIDE: 99 mmol/L — AB (ref 101–111)
CHLORIDE: 102 mmol/L (ref 101–111)
CHLORIDE: 103 mmol/L (ref 101–111)
CO2: 24 mmol/L (ref 22–32)
CO2: 22 mmol/L (ref 22–32)
CO2: 22 mmol/L (ref 22–32)
CO2: 24 mmol/L (ref 22–32)
Calcium: 8.2 mg/dL — ABNORMAL LOW (ref 8.9–10.3)
Calcium: 7.6 mg/dL — ABNORMAL LOW (ref 8.9–10.3)
Calcium: 7.6 mg/dL — ABNORMAL LOW (ref 8.9–10.3)
Chloride: 105 mmol/L (ref 101–111)
Creatinine, Ser: 0.96 mg/dL (ref 0.61–1.24)
Creatinine, Ser: 0.83 mg/dL (ref 0.61–1.24)
Creatinine, Ser: 0.68 mg/dL (ref 0.61–1.24)
Creatinine, Ser: 0.62 mg/dL (ref 0.61–1.24)
GFR calc Af Amer: >60 mL/min (ref >60)
GFR calc Af Amer: >60 mL/min (ref >60)
GFR calc Af Amer: >60 mL/min (ref >60)
GFR calc non Af Amer: >60 mL/min (ref >60)
GFR calc non Af Amer: >60 mL/min (ref >60)
GLUCOSE: 227 mg/dL — AB (ref 65–99)
Glucose, Bld: 192 mg/dL — ABNORMAL HIGH (ref 65–99)
Glucose, Bld: 147 mg/dL — ABNORMAL HIGH (ref 65–99)
Glucose, Bld: 139 mg/dL — ABNORMAL HIGH (ref 65–99)
POTASSIUM: 3.3 mmol/L — AB (ref 3.5–5.1)
POTASSIUM: 2.9 mmol/L — AB (ref 3.5–5.1)
POTASSIUM: 3.9 mmol/L (ref 3.5–5.1)
POTASSIUM: 3 mmol/L — AB (ref 3.5–5.1)
SODIUM: 137 mmol/L (ref 135–145)
SODIUM: 137 mmol/L (ref 135–145)
SODIUM: 135 mmol/L (ref 135–145)
SODIUM: 139 mmol/L (ref 135–145)

## 2017-06-27 LAB — POCT I-STAT, CHEM 8
BUN: 23 mg/dL — AB (ref 6–20)
BUN: 22 mg/dL — AB (ref 6–20)
BUN: 24 mg/dL — ABNORMAL HIGH (ref 6–20)
BUN: 18 mg/dL (ref 6–20)
BUN: 15 mg/dL (ref 6–20)
CALCIUM ION: 1.06 mmol/L — AB (ref 1.15–1.40)
CALCIUM ION: 1.04 mmol/L — AB (ref 1.15–1.40)
CALCIUM ION: 1.05 mmol/L — AB (ref 1.15–1.40)
CHLORIDE: 97 mmol/L — AB (ref 101–111)
CHLORIDE: 98 mmol/L — AB (ref 101–111)
CHLORIDE: 100 mmol/L — AB (ref 101–111)
CREATININE: 0.9 mg/dL (ref 0.61–1.24)
CREATININE: 0.6 mg/dL — AB (ref 0.61–1.24)
CREATININE: 0.5 mg/dL — AB (ref 0.61–1.24)
CREATININE: 0.5 mg/dL — AB (ref 0.61–1.24)
Calcium, Ion: 1.11 mmol/L — ABNORMAL LOW (ref 1.15–1.40)
Calcium, Ion: 1.05 mmol/L — ABNORMAL LOW (ref 1.15–1.40)
Chloride: 99 mmol/L — ABNORMAL LOW (ref 101–111)
Chloride: 103 mmol/L (ref 101–111)
Creatinine, Ser: 0.7 mg/dL (ref 0.61–1.24)
GLUCOSE: 205 mg/dL — AB (ref 65–99)
GLUCOSE: 152 mg/dL — AB (ref 65–99)
GLUCOSE: 163 mg/dL — AB (ref 65–99)
Glucose, Bld: 232 mg/dL — ABNORMAL HIGH (ref 65–99)
Glucose, Bld: 250 mg/dL — ABNORMAL HIGH (ref 65–99)
HCT: 50 % (ref 39.0–52.0)
HCT: 48 % (ref 39.0–52.0)
HEMATOCRIT: 52 % (ref 39.0–52.0)
HEMATOCRIT: 52 % (ref 39.0–52.0)
HEMATOCRIT: 45 % (ref 39.0–52.0)
HEMOGLOBIN: 17 g/dL (ref 13.0–17.0)
Hemoglobin: 17.7 g/dL — ABNORMAL HIGH (ref 13.0–17.0)
Hemoglobin: 17.7 g/dL — ABNORMAL HIGH (ref 13.0–17.0)
Hemoglobin: 16.3 g/dL (ref 13.0–17.0)
Hemoglobin: 15.3 g/dL (ref 13.0–17.0)
POTASSIUM: 3.2 mmol/L — AB (ref 3.5–5.1)
POTASSIUM: 3.2 mmol/L — AB (ref 3.5–5.1)
Potassium: 2.8 mmol/L — ABNORMAL LOW (ref 3.5–5.1)
Potassium: 3.6 mmol/L (ref 3.5–5.1)
Potassium: 3.7 mmol/L (ref 3.5–5.1)
SODIUM: 138 mmol/L (ref 135–145)
SODIUM: 137 mmol/L (ref 135–145)
Sodium: 139 mmol/L (ref 135–145)
Sodium: 137 mmol/L (ref 135–145)
Sodium: 139 mmol/L (ref 135–145)
TCO2: 28 mmol/L (ref 0–100)
TCO2: 26 mmol/L (ref 0–100)
TCO2: 25 mmol/L (ref 0–100)
TCO2: 25 mmol/L (ref 0–100)
TCO2: 25 mmol/L (ref 0–100)

## 2017-06-27 LAB — URINALYSIS, ROUTINE W REFLEX MICROSCOPIC
Bacteria, UA: NONE SEEN
Bilirubin Urine: NEGATIVE
GLUCOSE, UA: NEGATIVE mg/dL
Ketones, ur: NEGATIVE mg/dL
Nitrite: NEGATIVE
PH: 5 (ref 5.0–8.0)
Protein, ur: NEGATIVE mg/dL
SPECIFIC GRAVITY, URINE: 1.021 (ref 1.005–1.030)

## 2017-06-27 LAB — CBC
HCT: 47.6 % (ref 39.0–52.0)
Hemoglobin: 16.2 g/dL (ref 13.0–17.0)
MCH: 29.1 pg (ref 26.0–34.0)
MCHC: 34 g/dL (ref 30.0–36.0)
MCV: 85.6 fL (ref 78.0–100.0)
PLATELETS: 244 10*3/uL (ref 150–400)
RBC: 5.56 MIL/uL (ref 4.22–5.81)
RDW: 13 % (ref 11.5–15.5)
WBC: 22.7 10*3/uL — ABNORMAL HIGH (ref 4.0–10.5)

## 2017-06-27 LAB — HEPATIC FUNCTION PANEL
ALK PHOS: 76 U/L (ref 38–126)
ALT: 173 U/L — AB (ref 17–63)
AST: 202 U/L — AB (ref 15–41)
Albumin: 4.2 g/dL (ref 3.5–5.0)
BILIRUBIN DIRECT: 0.2 mg/dL (ref 0.1–0.5)
Indirect Bilirubin: 0.9 mg/dL (ref 0.3–0.9)
TOTAL PROTEIN: 7 g/dL (ref 6.5–8.1)
Total Bilirubin: 1.1 mg/dL (ref 0.3–1.2)

## 2017-06-27 LAB — POCT I-STAT 3, ART BLOOD GAS (G3+)
ACID-BASE DEFICIT: 1 mmol/L (ref 0.0–2.0)
Acid-base deficit: 3 mmol/L — ABNORMAL HIGH (ref 0.0–2.0)
BICARBONATE: 26.1 mmol/L (ref 20.0–28.0)
Bicarbonate: 24.9 mmol/L (ref 20.0–28.0)
O2 Saturation: 99 %
O2 Saturation: 100 %
PCO2 ART: 51.2 mmHg — AB (ref 32.0–48.0)
PH ART: 7.398 (ref 7.350–7.450)
TCO2: 28 mmol/L (ref 0–100)
TCO2: 26 mmol/L (ref 0–100)
pCO2 arterial: 38.7 mmHg (ref 32.0–48.0)
pH, Arterial: 7.294 — ABNORMAL LOW (ref 7.350–7.450)
pO2, Arterial: 128 mmHg — ABNORMAL HIGH (ref 83.0–108.0)
pO2, Arterial: 163 mmHg — ABNORMAL HIGH (ref 83.0–108.0)

## 2017-06-27 LAB — PHOSPHORUS: Phosphorus: 4.3 mg/dL (ref 2.5–4.6)

## 2017-06-27 LAB — GLUCOSE, CAPILLARY
GLUCOSE-CAPILLARY: 130 mg/dL — AB (ref 65–99)
GLUCOSE-CAPILLARY: 135 mg/dL — AB (ref 65–99)
GLUCOSE-CAPILLARY: 136 mg/dL — AB (ref 65–99)
GLUCOSE-CAPILLARY: 147 mg/dL — AB (ref 65–99)
GLUCOSE-CAPILLARY: 154 mg/dL — AB (ref 65–99)
GLUCOSE-CAPILLARY: 157 mg/dL — AB (ref 65–99)
GLUCOSE-CAPILLARY: 167 mg/dL — AB (ref 65–99)
GLUCOSE-CAPILLARY: 178 mg/dL — AB (ref 65–99)
GLUCOSE-CAPILLARY: 237 mg/dL — AB (ref 65–99)
GLUCOSE-CAPILLARY: 240 mg/dL — AB (ref 65–99)
GLUCOSE-CAPILLARY: 257 mg/dL — AB (ref 65–99)
Glucose-Capillary: 156 mg/dL — ABNORMAL HIGH (ref 65–99)
Glucose-Capillary: 247 mg/dL — ABNORMAL HIGH (ref 65–99)
Glucose-Capillary: 212 mg/dL — ABNORMAL HIGH (ref 65–99)
Glucose-Capillary: 144 mg/dL — ABNORMAL HIGH (ref 65–99)
Glucose-Capillary: 146 mg/dL — ABNORMAL HIGH (ref 65–99)
Glucose-Capillary: 147 mg/dL — ABNORMAL HIGH (ref 65–99)
Glucose-Capillary: 142 mg/dL — ABNORMAL HIGH (ref 65–99)
Glucose-Capillary: 166 mg/dL — ABNORMAL HIGH (ref 65–99)
Glucose-Capillary: 162 mg/dL — ABNORMAL HIGH (ref 65–99)

## 2017-06-27 LAB — APTT
APTT: 21 s — AB (ref 24–36)
aPTT: 23 seconds — ABNORMAL LOW (ref 24–36)

## 2017-06-27 LAB — BLOOD GAS, ARTERIAL
Acid-base deficit: 1.9 mmol/L (ref 0.0–2.0)
Bicarbonate: 24.2 mmol/L (ref 20.0–28.0)
DRAWN BY: 441351
FIO2: 80
LHR: 14 {breaths}/min
MECHVT: 640 mL
O2 Saturation: 95.1 %
PCO2 ART: 56.1 mmHg — AB (ref 32.0–48.0)
PEEP/CPAP: 5 cmH2O
PO2 ART: 96.3 mmHg (ref 83.0–108.0)
Patient temperature: 98.6
pH, Arterial: 7.258 — ABNORMAL LOW (ref 7.350–7.450)

## 2017-06-27 LAB — TROPONIN I
Troponin I: 2.03 ng/mL (ref <0.03)
Troponin I: 3.27 ng/mL (ref <0.03)
Troponin I: 3.19 ng/mL (ref <0.03)
Troponin I: 2.78 ng/mL (ref <0.03)

## 2017-06-27 LAB — PROTIME-INR
INR: 1.08
INR: 1.08
PROTHROMBIN TIME: 14.1 s (ref 11.4–15.2)
PROTHROMBIN TIME: 14 s (ref 11.4–15.2)

## 2017-06-27 LAB — PROCALCITONIN: PROCALCITONIN: 0.41 ng/mL

## 2017-06-27 LAB — HIV ANTIBODY (ROUTINE TESTING W REFLEX): HIV SCREEN 4TH GENERATION: NONREACTIVE

## 2017-06-27 LAB — MAGNESIUM
MAGNESIUM: 1.9 mg/dL (ref 1.7–2.4)
Magnesium: 2 mg/dL (ref 1.7–2.4)

## 2017-06-27 LAB — TSH: TSH: 6.501 u[IU]/mL — AB (ref 0.350–4.500)

## 2017-06-27 LAB — MRSA PCR SCREENING: MRSA by PCR: NEGATIVE

## 2017-06-27 MED ORDER — SODIUM CHLORIDE 0.9 % IV SOLN: 250.0000 mL | INTRAVENOUS | Status: DC | PRN

## 2017-06-27 MED ORDER — INSULIN ASPART 100 UNIT/ML ~~LOC~~ SOLN
0.0000 [IU] | SUBCUTANEOUS | Status: DC
  Administered 2017-06-27: 3 [IU] via SUBCUTANEOUS

## 2017-06-27 MED ORDER — ACETAMINOPHEN 325 MG PO TABS
650.0000 mg | ORAL_TABLET | ORAL | Status: DC | PRN
  Administered 2017-06-28: 650 mg via ORAL
  Filled 2017-06-27 (×2): qty 2

## 2017-06-27 MED ORDER — ONDANSETRON HCL 4 MG/2ML IJ SOLN
4.0000 mg | Freq: Four times a day (QID) | INTRAMUSCULAR | Status: DC | PRN
  Administered 2017-06-28: 4 mg via INTRAVENOUS
  Filled 2017-06-27: qty 2

## 2017-06-27 MED ORDER — CHLORHEXIDINE GLUCONATE 0.12% ORAL RINSE (MEDLINE KIT)
15.0000 mL | Freq: Two times a day (BID) | OROMUCOSAL | Status: DC
  Administered 2017-06-27 – 2017-06-30 (×8): 15 mL via OROMUCOSAL

## 2017-06-27 MED ORDER — CHLORHEXIDINE GLUCONATE CLOTH 2 % EX PADS
6.0000 | MEDICATED_PAD | Freq: Every day | CUTANEOUS | Status: DC
  Administered 2017-06-27: 6 via TOPICAL

## 2017-06-27 MED ORDER — CISATRACURIUM BOLUS VIA INFUSION
0.0500 mg/kg | INTRAVENOUS | Status: DC | PRN
Start: 2017-06-27 — End: 2017-06-28
  Filled 2017-06-27: qty 5

## 2017-06-27 MED ORDER — SODIUM CHLORIDE 0.9 % IV SOLN
INTRAVENOUS | Status: DC
  Administered 2017-06-27: 1.8 [IU]/h via INTRAVENOUS
  Filled 2017-06-27: qty 1

## 2017-06-27 MED ORDER — SODIUM CHLORIDE 0.9 % IV SOLN
1.0000 ug/kg/min | INTRAVENOUS | Status: DC
  Administered 2017-06-27 – 2017-06-28 (×3): 1.5 ug/kg/min via INTRAVENOUS
  Filled 2017-06-27 (×3): qty 20

## 2017-06-27 MED ORDER — SODIUM CHLORIDE 0.9% FLUSH
10.0000 mL | Freq: Two times a day (BID) | INTRAVENOUS | Status: DC
  Administered 2017-06-27 – 2017-06-30 (×6): 10 mL

## 2017-06-27 MED ORDER — INSULIN ASPART 100 UNIT/ML ~~LOC~~ SOLN
2.0000 [IU] | SUBCUTANEOUS | Status: DC
  Administered 2017-06-27: 4 [IU] via SUBCUTANEOUS
  Administered 2017-06-27 – 2017-07-01 (×13): 2 [IU] via SUBCUTANEOUS
  Administered 2017-07-01: 4 [IU] via SUBCUTANEOUS

## 2017-06-27 MED ORDER — ORAL CARE MOUTH RINSE
15.0000 mL | OROMUCOSAL | Status: DC
  Administered 2017-06-27 – 2017-06-30 (×36): 15 mL via OROMUCOSAL

## 2017-06-27 MED ORDER — POTASSIUM CHLORIDE 10 MEQ/50ML IV SOLN
INTRAVENOUS | Status: AC
  Administered 2017-06-27: 10 meq via INTRAVENOUS
  Filled 2017-06-27: qty 50

## 2017-06-27 MED ORDER — SODIUM CHLORIDE 0.9% FLUSH: 10.0000 mL | INTRAVENOUS | Status: DC | PRN

## 2017-06-27 MED ORDER — INSULIN GLARGINE 100 UNIT/ML ~~LOC~~ SOLN
10.0000 [IU] | SUBCUTANEOUS | Status: DC
  Administered 2017-06-27 – 2017-07-02 (×6): 10 [IU] via SUBCUTANEOUS
  Filled 2017-06-27 (×6): qty 0.1

## 2017-06-27 MED ORDER — SODIUM CHLORIDE 0.9 % IV SOLN
INTRAVENOUS | Status: AC
  Administered 2017-06-27: 07:00:00 via INTRAVENOUS

## 2017-06-27 MED ORDER — POTASSIUM CHLORIDE 10 MEQ/50ML IV SOLN
10.0000 meq | INTRAVENOUS | Status: AC
  Administered 2017-06-27 (×6): 10 meq via INTRAVENOUS
  Filled 2017-06-27 (×6): qty 50

## 2017-06-27 MED ORDER — HEPARIN SODIUM (PORCINE) 5000 UNIT/ML IJ SOLN
5000.0000 [IU] | Freq: Three times a day (TID) | INTRAMUSCULAR | Status: DC
  Administered 2017-06-27 – 2017-07-04 (×21): 5000 [IU] via SUBCUTANEOUS
  Filled 2017-06-27 (×21): qty 1

## 2017-06-27 MED ORDER — POTASSIUM CHLORIDE 10 MEQ/50ML IV SOLN
10.0000 meq | INTRAVENOUS | Status: AC
  Administered 2017-06-27 (×5): 10 meq via INTRAVENOUS
  Filled 2017-06-27 (×4): qty 50

## 2017-06-27 MED ORDER — SODIUM CHLORIDE 0.9% FLUSH
3.0000 mL | Freq: Two times a day (BID) | INTRAVENOUS | Status: DC
  Administered 2017-06-27: 3 mL via INTRAVENOUS

## 2017-06-27 MED ORDER — CHLORHEXIDINE GLUCONATE CLOTH 2 % EX PADS
6.0000 | MEDICATED_PAD | Freq: Every day | CUTANEOUS | Status: DC
  Administered 2017-06-28 – 2017-06-30 (×3): 6 via TOPICAL

## 2017-06-27 MED ORDER — SODIUM CHLORIDE 0.9% FLUSH: 3.0000 mL | INTRAVENOUS | Status: DC | PRN

## 2017-06-27 MED FILL — Heparin Sodium (Porcine) 2 Unit/ML in Sodium Chloride 0.9%: INTRAMUSCULAR | Qty: 1000 | Status: AC

## 2017-06-27 NOTE — Procedures (Signed)
ELECTROENCEPHALOGRAM REPORT  Date of Study: 06/27/2017  Patient's Name: Jeffery Thomas MRN: [id number] Date of Birth: Feb 17, 1951  Referring Provider: Dr. Merrie Roof  Clinical History: This is a 66 year old man s/p cardiac arrest, on hypothermia protocol  Medications: midazolam (VERSED) 50 mg in sodium chloride 0.9 % 50 mL (1 mg/mL) infusion  fentaNYL (SUBLIMAZE) bolus via infusion 50 mcg  acetaminophen (TYLENOL) tablet 650 mg  amiodarone (NEXTERONE PREMIX) 360-4.14 MG/200ML-% (1.8 mg/mL) IV infusion  artificial tears (LACRILUBE) ophthalmic ointment 1 application  aspirin suppository 300 mg  chlorhexidine gluconate (MEDLINE KIT) (PERIDEX) 0.12 % solution 15 mL  Chlorhexidine Gluconate Cloth 2 % PADS 6 each  cisatracurium (NIMBEX) 200 mg in sodium chloride 0.9 % 200 mL (1 mg/mL) infusion  insulin regular (NOVOLIN R,HUMULIN R) 100 Units in sodium chloride 0.9 % 100 mL (1 Units/mL) infusion  norepinephrine (LEVOPHED) 4 mg in dextrose 5 % 250 mL (0.016 mg/mL) infusion  ondansetron (ZOFRAN) injection 4 mg  pantoprazole (PROTONIX) injection 40 mg   Technical Summary: A multichannel digital EEG recording measured by the international 10-20 system with electrodes applied with paste and impedances below 5000 ohms performed in our laboratory with EKG monitoring in an intubated and sedated patient on hypothermia protocol at 32.8 degrees Celsius.  Hyperventilation and photic stimulation were not performed.  The digital EEG was referentially recorded, reformatted, and digitally filtered in a variety of bipolar and referential montages for optimal display.    Description: The patient is intubated and sedated on Fentanyl and Versedduring the recording. There is loss of normal background activity. The record read at a sensitivity of 3 uV/mm shows diffuse suppression and slowing of background activity. There is additional occasional focal low voltage delta slowing over the right temporal region.  There is slight increase in faster frequencies with stimulation. Normal sleep architecture is not seen. Hyperventilation and photic stimulation were not performed. There were no epileptiform discharges or electrographic seizures seen.   EKG lead was unremarkable.  Impression: This sedated EEG is abnormal due to the presence of: 1. Diffuse background suppression and slowing 2. Occasional focal slowing over the right temporal region  Clinical Correlation of the above findings indicates diffuse cerebral dysfunction that is non-specific in etiology and can be seen in the setting of anoxic/ischemic injury, toxic/metabolic encephalopathies, or medication effect from Versed and Fentanyl. Additional focal slowing over the right temporal region indicates focal cerebral dysfunction in this region suggestive of underlying structural or physiologic abnormality. There were no electrographic seizures in this study. Clinical correlation is advised.  Ellouise Newer, M.D.

## 2017-06-27 NOTE — Progress Notes (Signed)
eLink Physician-Brief Progress Note Patient Name: Jeffery Thomas DOB: 12-Apr-1951 MRN: [id number]   Date of Service  06/27/2017  HPI/Events of Note  Hyperglycemia - Blood glucose = 178.  eICU Interventions  Will order: 1. Q 4 hour sensitive Novolog SSI.      Intervention Category Major Interventions: Hyperglycemia - active titration of insulin therapy  Kaylyn Garrow [place] 06/27/2017, 1:11 AM

## 2017-06-27 NOTE — Progress Notes (Addendum)
Progress Note  Patient Name: Jeffery Thomas Date of Encounter: 06/27/2017  Primary Cardiologist: Gwenlyn Found  Subjective   Pt is sedated on cooling protocol.   Inpatient Medications    Scheduled Meds: . artificial tears  1 application Both Eyes W4X  . aspirin  300 mg Rectal NOW  . chlorhexidine gluconate (MEDLINE KIT)  15 mL Mouth Rinse BID  . [START ON 06/28/2017] Chlorhexidine Gluconate Cloth  6 each Topical Q0600  . fentaNYL (SUBLIMAZE) injection  100 mcg Intravenous Once  . heparin subcutaneous  5,000 Units Subcutaneous Q8H  . mouth rinse  15 mL Mouth Rinse 10 times per day  . midazolam  2 mg Intravenous Once  . pantoprazole (PROTONIX) IV  40 mg Intravenous QHS  . sodium chloride flush  10-40 mL Intracatheter Q12H  . sodium chloride flush  3 mL Intravenous Q12H   Continuous Infusions: . sodium chloride 75 mL/hr at 06/26/17 2300  . sodium chloride 75 mL/hr at 06/27/17 0700  . sodium chloride    . amiodarone 30 mg/hr (06/27/17 0709)  . cisatracurium (NIMBEX) infusion 1.5 mcg/kg/min (06/27/17 0901)  . fentaNYL infusion INTRAVENOUS 300 mcg/hr (06/27/17 0557)  . insulin (NOVOLIN-R) infusion 2.6 Units/hr (06/27/17 0907)  . midazolam (VERSED) infusion 5 mg/hr (06/27/17 0849)  . norepinephrine (LEVOPHED) Adult infusion    . potassium chloride 10 mEq (06/27/17 0910)   PRN Meds: sodium chloride, acetaminophen, [COMPLETED] cisatracurium **AND** cisatracurium (NIMBEX) infusion **AND** cisatracurium, fentaNYL, midazolam, ondansetron (ZOFRAN) IV, sodium chloride flush, sodium chloride flush   Vital Signs    Vitals:   06/27/17 0745 06/27/17 0800 06/27/17 0817 06/27/17 0900  BP: 106/80     Pulse:  65  63  Resp:  18  18  Temp:  (!) 91.8 F (33.2 C)  (!) 90.9 F (32.7 C)  TempSrc:  Core (Comment)  Core (Comment)  SpO2: 100% 100% 100% 100%  Weight:      Height:        Intake/Output Summary (Last 24 hours) at 06/27/17 0952 Last data filed at 06/27/17 0900  Gross per 24 hour    Intake          2539.02 ml  Output             2450 ml  Net            89.02 ml   Filed Weights   06/26/17 2159 06/27/17 0130  Weight: 154 lb 5.2 oz (70 kg) 208 lb 0.8 oz (94.4 kg)    Telemetry    Sinus - Personally Reviewed  ECG      Physical Exam   GEN: Intubated and sedated.  Neck: No JVD Cardiac: RRR, no murmurs, rubs, or gallops.  Respiratory: Clear to auscultation bilaterally. GI: Soft, non-distended  Ext: no LE edema.  Psych: sedated  Labs    Chemistry Recent Labs Lab 06/26/17 2137  06/27/17 0146  06/27/17 0533 06/27/17 0710 06/27/17 0811  NA 137  < > 137  < > 137 137 135  K 3.4*  < > 3.3*  < > 2.9* 3.6 3.9  CL 98*  < > 99*  < > 102 100* 103  CO2 17*  --  24  --  22  --  22  GLUCOSE 254*  < > 227*  < > 192* 152* 147*  BUN 22*  < > 21*  < > '18 18 16  ' CREATININE 1.60*  < > 0.96  < > 0.83 0.50* 0.68  CALCIUM 8.0*  --  8.2*  --  7.6*  --  7.6*  PROT 5.9*  --  7.0  --   --   --   --   ALBUMIN 3.6  --  4.2  --   --   --   --   AST 218*  --  202*  --   --   --   --   ALT 134*  --  173*  --   --   --   --   ALKPHOS 75  --  76  --   --   --   --   BILITOT 1.0  --  1.1  --   --   --   --   GFRNONAA 44*  --  >60  --  >60  --  >60  GFRAA 51*  --  >60  --  >60  --  >60  ANIONGAP 22*  --  14  --  13  --  10  < > = values in this interval not displayed.   Hematology Recent Labs Lab 06/26/17 2137  06/27/17 0515 06/27/17 0533 06/27/17 0710  WBC 12.4*  --   --  22.7*  --   RBC 5.12  --   --  5.56  --   HGB 14.9  < > 17.0 16.2 16.3  HCT 45.4  < > 50.0 47.6 48.0  MCV 88.7  --   --  85.6  --   MCH 29.1  --   --  29.1  --   MCHC 32.8  --   --  34.0  --   RDW 13.4  --   --  13.0  --   PLT 242  --   --  244  --   < > = values in this interval not displayed.  Cardiac Enzymes Recent Labs Lab 06/26/17 2137 06/27/17 0146 06/27/17 0533  TROPONINI 0.13* 2.03* 3.27*    Recent Labs Lab 06/26/17 2152  TROPIPOC 0.16*     BNPNo results for input(s): BNP,  PROBNP in the last 168 hours.   DDimer No results for input(s): DDIMER in the last 168 hours.   Radiology    Dg Chest Port 1 View  Result Date: 06/27/2017 CLINICAL DATA:  Acute respiratory failure with hypoxia EXAM: PORTABLE CHEST 1 VIEW COMPARISON:  06/27/2017 FINDINGS: Endotracheal tube has been advanced with the tip 4-5 cm above the carina. NG tube and left central line remain in place, unchanged. Cardiomegaly with vascular congestion. Bilateral lower lobe atelectasis or infiltrates, increased since prior study. IMPRESSION: Cardiomegaly, vascular congestion. Increasing bibasilar atelectasis or infiltrates. Electronically Signed   By: Rolm Baptise M.D.   On: 06/27/2017 08:43   Dg Chest Port 1 View  Result Date: 06/27/2017 CLINICAL DATA:  Central line placement EXAM: PORTABLE CHEST 1 VIEW COMPARISON:  06/26/2017 FINDINGS: New left IJ central line catheter tip is seen at the cavoatrial junction without pneumothorax. Low lung volumes with interstitial edema and cardiomegaly. Tortuous thoracic aorta. Endotracheal tube tip is approximately 8.5 cm above the carina. Gastric tube extends below the left hemidiaphragm. A pH probe is seen in the distal esophagus. There appear to be ice packs project over the base of the neck and right arm. IMPRESSION: 1. Left IJ central line catheter at the cavoatrial junction. No pneumothorax. 2. Satisfactory support line and tube positions. 3. Stable cardiomegaly with mild interstitial edema. 4. Aortic atherosclerosis. Electronically Signed   By: Ashley Royalty M.D.   On: 06/27/2017 01:02  Dg Chest Portable 1 View  Result Date: 06/26/2017 CLINICAL DATA:  66 y/o  M; post CPR and intubation. EXAM: PORTABLE CHEST 1 VIEW COMPARISON:  None. FINDINGS: Mildly enlarged cardiac silhouette given projection and technique. Endotracheal tube tip is 9.5 cm from the carina. Pulmonary vascular congestion. No pneumothorax. The no focal consolidation. No acute osseous abnormality is evident.  IMPRESSION: Mildly enlarged cardiac silhouette. Pulmonary vascular congestion. Endotracheal tube tip 9.5 cm from carina, consider advancement. Electronically Signed   By: Kristine Garbe M.D.   On: 06/26/2017 22:11   Dg Abd Portable 1v  Result Date: 06/27/2017 CLINICAL DATA:  Check gastric catheter placement EXAM: PORTABLE ABDOMEN - 1 VIEW COMPARISON:  None. FINDINGS: Scattered large and small bowel gas is noted. A gastric catheter is noted within the stomach. No acute bony abnormality is seen. No mass lesion is noted. IMPRESSION: Gastric catheter within the stomach. Electronically Signed   By: Inez Catalina M.D.   On: 06/27/2017 07:42    Cardiac Studies   Cardiac cath 06/26/17: No evidence of CAD LVEF=25%  Patient Profile     66 y.o. male with history of HTN, HLD, GERD admitted following out of hospital cardiac arrest. He reported chest pain to his wife then collapsed in the other room. AED applied by EMS and pt was shocked. He then had PEA and then v. Fib with ROSC with 20 minutes of CPR. Emergent cardiac cath with normal coronary arteries. LVEF severely reduced at 25%.   Assessment & Plan    1. Cardiac arrest: Pt with no evidence of CAD but with severe LV systolic dysfunction. He is now being cooled by PCCM. Full echo is pending. 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 Supportive care for now. If he has neurological recovery, will need EP consult to discuss ICD. Agree with continuing IV amiodarone for now given ventricular ectopy on tele early this am and presentation with arrest.  2. Non-ischemic cardiomyopathy: Will begin medical therapy once pt is rewarmed.    Signed, Lauree Chandler, MD  06/27/2017, 9:52 AM

## 2017-06-27 NOTE — Procedures (Signed)
Central Venous Catheter Insertion Procedure Note Jeffery Thomas [id number] 05/09/1951  Procedure: Insertion of Central Venous Catheter Indications: Assessment of intravascular volume, Drug and/or fluid administration and Frequent blood sampling  Procedure Details Consent: Unable to obtain consent because of altered level of consciousness. Time Out: Verified patient identification, verified procedure, site/side was marked, verified correct patient position, special equipment/implants available, medications/allergies/relevent history reviewed, required imaging and test results available.  Performed  Maximum sterile technique was used including antiseptics, cap, gloves, gown, hand hygiene, mask and sheet. Skin prep: Chlorhexidine; local anesthetic administered A antimicrobial bonded/coated triple lumen catheter was placed in the left internal jugular vein using the Seldinger technique.  Evaluation Blood flow good Complications: No apparent complications Patient did tolerate procedure well. Chest X-ray ordered to verify placement.  CXR: pending.  Procedure performed under direct ultrasound guidance for real time vessel cannulation.      Montey Hora, [facility] Pager: [phone]  or [phone] 06/27/2017, 12:17 AM

## 2017-06-27 NOTE — Progress Notes (Signed)
   06/27/17 0800  Clinical Encounter Type  Visited With Family;Health care provider  Visit Type Initial;Psychological support;ED  Referral From Nurse  Spiritual Encounters  Spiritual Needs Emotional  Stress Factors  Patient Stress Factors None identified  Family Stress Factors Loss of control    Chaplain responded to a page regarding a Pt who arrived as a code stemi from home. Chaplain met the Wife and walked her back into the trauma per request of Doctor. Wife was updated and chaplain walked with her to [facility]. (the unit where the Pt was heading)

## 2017-06-27 NOTE — Progress Notes (Signed)
eLink Physician-Brief Progress Note Patient Name: Jeffery Thomas DOB: 1950-12-01 MRN: [id number]   Date of Service  06/27/2017  HPI/Events of Note  Request to review CXR for CVL placement. L IJ CVL tip in distal SVC at cavoatrial junction.   eICU Interventions  OK to use L IJ CVL for medication and fluid administration.      Intervention Category Intermediate Interventions: Diagnostic test evaluation  Kayode Petion Eugene 06/27/2017, 1:28 AM

## 2017-06-27 NOTE — Progress Notes (Signed)
eLink Physician-Brief Progress Note Patient Name: Jeffery Thomas DOB: 06-13-1951 MRN: [id number]   Date of Service  06/27/2017  HPI/Events of Note  ABG on 100%/PRVC 14/TV640/P 5 = 7.25/56.1/96.3/24.2  eICU Interventions  Will order: 1. Increase PRVC rate to 18.  2. Repeat ABG at 7 AM.     Intervention Category Major Interventions: Acid-Base disturbance - evaluation and management;Respiratory failure - evaluation and management  Sommer,Steven Cornelia Copa 06/27/2017, 4:58 AM

## 2017-06-27 NOTE — Progress Notes (Signed)
EEG Completed; Results Pending  

## 2017-06-27 NOTE — Progress Notes (Signed)
eLink Physician-Brief Progress Note Patient Name: Jeffery Thomas DOB: October 16, 1951 MRN: [id number]   Date of Service  06/27/2017  HPI/Events of Note  Low K  supp  No rewarm till 2 am   eICU Interventions       Intervention Category Major Interventions: Electrolyte abnormality - evaluation and management  Raylene Miyamoto. 06/27/2017, 6:23 PM

## 2017-06-27 NOTE — Care Management Note (Addendum)
Case Management Note  Patient Details  Name: GEROGE GILLIAM MRN: [id number] Date of Birth: 08-19-1951  Subjective/Objective:   From home with wife, presents with acute hypoxic resp failure with cardiac arrest, on vent, acute encephalopathy, (? Anoxic brain injury),STEMI.    8/23 [place], BSN - conts on vent on precedex, fentanyl, and diprovan.    8/24 [place], BSN- self extubated, per Cards will ask for EP consul on Monday after patient has more time to recover.  Considering a LifeVest vs ICD.                   Action/Plan: NCM will follow for dc needs.  Expected Discharge Date:                  Expected Discharge Plan:     In-House Referral:     Discharge planning Services  CM Consult  Post Acute Care Choice:    Choice offered to:     DME Arranged:    DME Agency:     HH Arranged:    HH Agency:     Status of Service:  In process, will continue to follow  If discussed at Long Length of Stay Meetings, dates discussed:    Additional Comments:  Zenon Mayo, RN 06/27/2017, 2:20 PM

## 2017-06-27 NOTE — Progress Notes (Addendum)
PULMONARY / CRITICAL CARE MEDICINE   Name: Jeffery Thomas MRN: [id number] DOB: 1951/06/13    ADMISSION DATE:  06/26/2017 CONSULTATION DATE:  06/26/17  REFERRING MD:  Aundra Dubin  CHIEF COMPLAINT:  Cardiac Arrest  HISTORY OF PRESENT ILLNESS:  66 y.o. [facility] ED 8/20 after cardiac arrest.  He was in his usual state of health until he finished dinner.  He then reported chest pain and when he went back to his bedroom, wife heard a noise.  She found him down and unresponsive. EMS was called and pt was initially AED advised to shock.  He was then found to be in PEA.  After epi, he went into v.fib requiring a shock, then VT requiring 3rd shock.  Total ACLS time roughly 20 minutes. In ED, he was found to have inferior STEMI.  There was question whether pt had purposeful movement after intubation, or whether it was simply pt reaching unpurposefully.  SUBJECTIVE:  Started on therapeutic hypothermia overnight. No acute events.   REVIEW OF SYSTEMS:  Unable to obtain as patient is currently intubated, sedated, and encephalopathic.  VITAL SIGNS: BP 106/80   Pulse 65   Temp (!) 92.1 F (33.4 C)   Resp 18   Ht 6\' 2"  (1.88 m)   Wt 208 lb 0.8 oz (94.4 kg)   SpO2 100%   BMI 26.71 kg/m   HEMODYNAMICS: CVP:  [3 mmHg-5 mmHg] 5 mmHg  VENTILATOR SETTINGS: Vent Mode: PRVC FiO2 (%):  [80 %-100 %] 80 % Set Rate:  [14 bmp-18 bmp] 18 bmp Vt Set:  [640 mL] 640 mL PEEP:  [5 cmH20] 5 cmH20 Plateau Pressure:  [10 cmH20-20 cmH20] 10 cmH20  INTAKE / OUTPUT: I/O last 3 completed shifts: In: 1985.9 [I.V.:1985.9] Out: 2200 [Urine:2200]   PHYSICAL EXAMINATION: General: No acute distress.No family at bedside.  Integument:  Warm & dry. No rash on exposed skin. No bruising. Extremities:  No cyanosis or clubbing.  HEENT:  Moist mucus membranes. No scleral injection or icterus. Endotracheal tube in place.  Cardiovascular:  Regular rate. No edema. No appreciable JVD.  Pulmonary:  Good aeration & clear to auscultation  bilaterally. Symmetric chest wall rise on ventilator. Abdomen: Soft. Normal bowel sounds. Nondistended.  Musculoskeletal:  No joint deformity or effusion appreciated. Neurological: Sedated and paralyzed. Pupils pinpoint.   LABS:  BMET  Recent Labs Lab 06/26/17 2137  06/27/17 0146 06/27/17 0311 06/27/17 0533  NA 137  < > 137 137 137  K 3.4*  < > 3.3* 2.8* 2.9*  CL 98*  < > 99* 98* 102  CO2 17*  --  24  --  22  BUN 22*  < > 21* 22* 18  CREATININE 1.60*  < > 0.96 0.70 0.83  GLUCOSE 254*  < > 227* 250* 192*  < > = values in this interval not displayed.  Electrolytes  Recent Labs Lab 06/26/17 2137 06/27/17 0146 06/27/17 0533  CALCIUM 8.0* 8.2* 7.6*  MG  --  2.0 1.9  PHOS  --   --  4.3    CBC  Recent Labs Lab 06/26/17 2137  06/27/17 0120 06/27/17 0311 06/27/17 0533  WBC 12.4*  --   --   --  22.7*  HGB 14.9  < > 17.7* 17.7* 16.2  HCT 45.4  < > 52.0 52.0 47.6  PLT 242  --   --   --  244  < > = values in this interval not displayed.  Coag's  Recent Labs Lab 06/26/17 2137 06/27/17  0146  APTT 20* 21*  INR 1.14 1.08    Sepsis Markers  Recent Labs Lab 06/26/17 2154  LATICACIDVEN 11.42*    ABG  Recent Labs Lab 06/26/17 2303 06/27/17 0209 06/27/17 0412  PHART 7.436 7.294* 7.258*  PCO2ART 34.2 51.2* 56.1*  PO2ART 109.0* 128.0* 96.3    Liver Enzymes  Recent Labs Lab 06/26/17 2137 06/27/17 0146  AST 218* 202*  ALT 134* 173*  ALKPHOS 75 76  BILITOT 1.0 1.1  ALBUMIN 3.6 4.2    Cardiac Enzymes  Recent Labs Lab 06/26/17 2137 06/27/17 0146 06/27/17 0533  TROPONINI 0.13* 2.03* 3.27*    Glucose  Recent Labs Lab 06/27/17 0119 06/27/17 0215 06/27/17 0308 06/27/17 0408 06/27/17 0512 06/27/17 0607  GLUCAP 247* 257* 240* 237* 212* 167*    Imaging Dg Chest Port 1 View  Result Date: 06/27/2017 CLINICAL DATA:  Central line placement EXAM: PORTABLE CHEST 1 VIEW COMPARISON:  06/26/2017 FINDINGS: New left IJ central line catheter tip  is seen at the cavoatrial junction without pneumothorax. Low lung volumes with interstitial edema and cardiomegaly. Tortuous thoracic aorta. Endotracheal tube tip is approximately 8.5 cm above the carina. Gastric tube extends below the left hemidiaphragm. A pH probe is seen in the distal esophagus. There appear to be ice packs project over the base of the neck and right arm. IMPRESSION: 1. Left IJ central line catheter at the cavoatrial junction. No pneumothorax. 2. Satisfactory support line and tube positions. 3. Stable cardiomegaly with mild interstitial edema. 4. Aortic atherosclerosis. Electronically Signed   By: Ashley Royalty M.D.   On: 06/27/2017 01:02   Dg Chest Portable 1 View  Result Date: 06/26/2017 CLINICAL DATA:  66 y/o  M; post CPR and intubation. EXAM: PORTABLE CHEST 1 VIEW COMPARISON:  None. FINDINGS: Mildly enlarged cardiac silhouette given projection and technique. Endotracheal tube tip is 9.5 cm from the carina. Pulmonary vascular congestion. No pneumothorax. The no focal consolidation. No acute osseous abnormality is evident. IMPRESSION: Mildly enlarged cardiac silhouette. Pulmonary vascular congestion. Endotracheal tube tip 9.5 cm from carina, consider advancement. Electronically Signed   By: Kristine Garbe M.D.   On: 06/26/2017 22:11   Dg Abd Portable 1v  Result Date: 06/27/2017 CLINICAL DATA:  Check gastric catheter placement EXAM: PORTABLE ABDOMEN - 1 VIEW COMPARISON:  None. FINDINGS: Scattered large and small bowel gas is noted. A gastric catheter is noted within the stomach. No acute bony abnormality is seen. No mass lesion is noted. IMPRESSION: Gastric catheter within the stomach. Electronically Signed   By: Inez Catalina M.D.   On: 06/27/2017 07:42    STUDIES:  LHC 8/20:  There is severe left ventricular systolic dysfunction.  LV end diastolic pressure is mildly elevated.  The left ventricular ejection fraction is less than 25% by visual estimate.  Configuration  consistent with Takatsubo Syndrome PORT CXR 8/21:  Personally reviewed by me. Endotracheal tube approximately 8 cm from carina. Enteric feeding tube coursing below diaphragm. Left internal jugular central venous catheter in good position. No focal opacity or effusion appreciated. TTE 8/21 >>> EEG 8/21 >>>  MICROBIOLOGY: MRSA PCR 8/21:  Negative Blood Cultures x2 8/21 >>> Urine Culture 8/21 >>> Tracheal Aspirate Culture 8/21 >>>  ANTIBIOTICS: None.  SIGNIFICANT EVENTS: 8/20 - Admit & started therapeutic hypothermia post arrest  LINES/TUBES: OETT 7.0 8/20 >  L [place] CVL 8/21 >>> L RAD ART LINE 8/21 >>> OGT >>> FOLEY >>> PIV  ASSESSMENT / PLAN:  PULMONARY A: Acute hypoxic respiratory failure: In the setting  of cardiac arrest.  P:   Continuing full ventilator support Holding on pressure support wean and spontaneous breathing trial until off neuromuscular blockade Advancing endotracheal tube 4-5 centimeter Repeat portable chest x-ray after tube repositioning Repeat ABG in a.m. Weaning FiO2 for saturation greater than 94%  CARDIOVASCULAR A:  Inferior STEMI Takatsubo Cardiomyopathy H/O HTN  P:  Monitoring vitals per unit protocol Continuous telemetry monitoring Management per her primary service Echocardiogram pending Place CVL, arterial line. Hold preadmission amlodipine, hydrodiuril, toprol-xl.  RENAL A:   Hypokalemia: Resolved. Acute renal failure: Resolved.  P:   Monitor urine output with Foley catheter Monitoring electrolytes closely with hypothermia protocol Continuing normal saline at 75 mL per hour  GASTROINTESTINAL A:   No acute issues.  P:   Nothing by mouth Holding on tube feedings pending rewarming  HEMATOLOGIC A:   Leukocytosis: Unclear etiology.  P:  Trending cell counts daily with CBC  INFECTIOUS A:   No indication of infection.  P:   Trending Procalcitonin per algorithm Checking blood, urine, endotracheal aspirate cultures  given leukocytosis Holding on antibiotics for now  ENDOCRINE A:   Hyperglycemia: Controlled.  P:   Accu-Cheks every hour Insulin drip per protocol  NEUROLOGIC A:   Acute encephalopathy: Question possible anoxic brain injury post arrest. Sedation on ventilator & paralytic  P:   Sedation:  Propofol gtt / Fentanyl gtt / Cisatracurium gtt / Midazolam PRN. RASS goal: -5 while under NMB. Holding off on CT head for now Awaiting EEG Neuro consult once rewarmed.  Prophylaxis: Protonix IV daily, SCDs, & Heparin [place] q8hr.  Diet: Nothing by mouth. Holding on tube feedings. Code Status: Full code per previous physician's discussions. Disposition: Patient remains critically ill with guarded prognosis. Family Update:  No family present at the time of my exam.   DISCUSSION:   66 y.o. Male with inferior ST elevation MI. Found to have cardiomyopathy. Given encephalopathy started on therapeutic hypothermia protocol.    I have personally spent an additional total of 32 minutes of critical care time today caring for the patient & reviewing the patient's electronic medical record.  Remainder of care as per primary service under the consultants.  Sonia Baller Ashok Cordia, M.D. [facility] Pager:  [phone] After 3pm or if no response, call [phone] 06/27/2017, 7:49 AM

## 2017-06-27 NOTE — Procedures (Signed)
Arterial Catheter Insertion Procedure Note YEHUDAH STANDING [id number] Sep 08, 1951  Procedure: Insertion of Arterial Catheter  Indications: Blood pressure monitoring and Frequent blood sampling  Procedure Details Consent: Risks of procedure as well as the alternatives and risks of each were explained to the (patient/caregiver).  Consent for procedure obtained. and Unable to obtain consent because of emergent medical necessity. Time Out: Verified patient identification, verified procedure, site/side was marked, verified correct patient position, special equipment/implants available, medications/allergies/relevent history reviewed, required imaging and test results available.  Performed  Maximum sterile technique was used including antiseptics, cap, gloves, gown, hand hygiene, mask and sheet. Skin prep: Chlorhexidine; local anesthetic administered 20 gauge catheter was inserted into left radial artery using the Seldinger technique.  Evaluation Blood flow good; BP tracing good. Complications: No apparent complications.   Ulice Dash 06/27/2017

## 2017-06-27 NOTE — Progress Notes (Signed)
Patients ETT advanced to 26 @ the lip. Patient had good return volumes.

## 2017-06-27 NOTE — Progress Notes (Signed)
eLink Physician-Brief Progress Note Patient Name: Jeffery Thomas DOB: [birth year] MRN: [id number]   Date of Service  06/27/2017  HPI/Events of Note  K+ = 2.8 and Creatinine = 0.70.  eICU Interventions  Will replace K+.     Intervention Category Major Interventions: Electrolyte abnormality - evaluation and management  Harlee Pursifull Eugene 06/27/2017, 3:29 AM

## 2017-06-27 NOTE — Progress Notes (Signed)
eLink Physician-Brief Progress Note Patient Name: Jeffery Thomas DOB: [birth year] MRN: [id number]   Date of Service  06/27/2017  HPI/Events of Note  ABG on 100%/PRVC 14/TV 640/P 5 = 7.29/51.2/128/28.  eICU Interventions  Will order: 1. Increase PRVC rate to 18. 2. ABG already ordered for 5 AM.      Intervention Category Major Interventions: Acid-Base disturbance - evaluation and management;Respiratory failure - evaluation and management  Chandi Nicklin Eugene 06/27/2017, 2:22 AM

## 2017-06-27 NOTE — Progress Notes (Signed)
Initial Nutrition Assessment  DOCUMENTATION CODES:   Not applicable  INTERVENTION:    If TF started, rec Vital AF 1.2 with at goal rate of 60 ml/h (1440 ml per day) and Prostat 30 ml TID   TF regimen to provide 2028 kcals, 153 gm protein, 1168 ml free water daily.  NUTRITION DIAGNOSIS:   Inadequate oral intake related to inability to eat as evidenced by NPO status  GOAL:   Patient will meet greater than or equal to 90% of their needs  MONITOR:   Vent status, Labs, Weight trends, Skin, I & O's  REASON FOR ASSESSMENT:   Ventilator  ASSESSMENT:   66 y.o. Male with a history of HTN, hyperlipidemia, and GERD had chest pain then cardiac arrest.   Patient is currently intubated on ventilator support >> OGT in place MV: 10.3 L/min Temp (24hrs), Avg:92.6 F (33.7 C), Min:90.3 F (32.4 C), Max:96.6 F (35.9 C)  In acute hypoxic respiratory failure in setting of cardiac arrest. In ED pt was found to have inferior NSTEMI. On hypothermia protocol. No nutrition problems identified PTA. TF initiation on hold. Labs and medications reviewed. Troponin I 3.19 (H). CBG's [id number].  Unable to complete Nutrition-Focused physical exam at this time.   Diet Order:  Diet NPO time specified  Skin:  Reviewed, no issues  Last BM:  8/21 flexiseal  Height:   Ht Readings from Last 1 Encounters:  06/26/17 6\' 2"  (1.88 m)   Weight:   Wt Readings from Last 1 Encounters:  06/27/17 208 lb 0.8 oz (94.4 kg)   Ideal Body Weight:  86.3 kg  BMI:  Body mass index is 26.71 kg/m.  Estimated Nutritional Needs (using 37 C degree temp):  Kcal:  2018  Protein:  140-155 gm  Fluid:  per MD  EDUCATION NEEDS:   No education needs identified at this time  Arthur Holms, RD, LDN Pager #: [phone] After-Hours Pager #: [phone]

## 2017-06-28 ENCOUNTER — Inpatient Hospital Stay (HOSPITAL_COMMUNITY)

## 2017-06-28 DIAGNOSIS — J9601 Acute respiratory failure with hypoxia: Secondary | ICD-10-CM

## 2017-06-28 DIAGNOSIS — I4901 Ventricular fibrillation: Secondary | ICD-10-CM

## 2017-06-28 DIAGNOSIS — I469 Cardiac arrest, cause unspecified: Secondary | ICD-10-CM

## 2017-06-28 LAB — POCT I-STAT, CHEM 8
BUN: 10 mg/dL (ref 6–20)
BUN: 12 mg/dL (ref 6–20)
CREATININE: 0.4 mg/dL — AB (ref 0.61–1.24)
Calcium, Ion: 1.11 mmol/L — ABNORMAL LOW (ref 1.15–1.40)
Calcium, Ion: 1.12 mmol/L — ABNORMAL LOW (ref 1.15–1.40)
Chloride: 103 mmol/L (ref 101–111)
Chloride: 104 mmol/L (ref 101–111)
Creatinine, Ser: 0.4 mg/dL — ABNORMAL LOW (ref 0.61–1.24)
GLUCOSE: 143 mg/dL — AB (ref 65–99)
Glucose, Bld: 143 mg/dL — ABNORMAL HIGH (ref 65–99)
HEMATOCRIT: 44 % (ref 39.0–52.0)
HEMATOCRIT: 46 % (ref 39.0–52.0)
HEMOGLOBIN: 15 g/dL (ref 13.0–17.0)
HEMOGLOBIN: 15.6 g/dL (ref 13.0–17.0)
POTASSIUM: 3.1 mmol/L — AB (ref 3.5–5.1)
Potassium: 4 mmol/L (ref 3.5–5.1)
SODIUM: 141 mmol/L (ref 135–145)
Sodium: 140 mmol/L (ref 135–145)
TCO2: 22 mmol/L (ref 0–100)
TCO2: 23 mmol/L (ref 0–100)

## 2017-06-28 LAB — BLOOD GAS, ARTERIAL
ACID-BASE DEFICIT: 1.6 mmol/L (ref 0.0–2.0)
Bicarbonate: 21.5 mmol/L (ref 20.0–28.0)
DRAWN BY: 51191
FIO2: 40
MECHVT: 640 mL
O2 Saturation: 98.6 %
PEEP/CPAP: 5 cmH2O
PH ART: 7.531 — AB (ref 7.350–7.450)
PO2 ART: 113 mmHg — AB (ref 83.0–108.0)
Patient temperature: 92.3
RATE: 18 resp/min
pCO2 arterial: 24.7 mmHg — ABNORMAL LOW (ref 32.0–48.0)

## 2017-06-28 LAB — GLUCOSE, CAPILLARY
GLUCOSE-CAPILLARY: 105 mg/dL — AB (ref 65–99)
GLUCOSE-CAPILLARY: 108 mg/dL — AB (ref 65–99)
GLUCOSE-CAPILLARY: 137 mg/dL — AB (ref 65–99)
GLUCOSE-CAPILLARY: 144 mg/dL — AB (ref 65–99)
Glucose-Capillary: 141 mg/dL — ABNORMAL HIGH (ref 65–99)
Glucose-Capillary: 133 mg/dL — ABNORMAL HIGH (ref 65–99)
Glucose-Capillary: 108 mg/dL — ABNORMAL HIGH (ref 65–99)

## 2017-06-28 LAB — BASIC METABOLIC PANEL
ANION GAP: 7 (ref 5–15)
Anion gap: 10 (ref 5–15)
BUN: 8 mg/dL (ref 6–20)
BUN: 8 mg/dL (ref 6–20)
CALCIUM: 7.9 mg/dL — AB (ref 8.9–10.3)
CALCIUM: 8.1 mg/dL — AB (ref 8.9–10.3)
CO2: 21 mmol/L — ABNORMAL LOW (ref 22–32)
CO2: 24 mmol/L (ref 22–32)
CREATININE: 0.67 mg/dL (ref 0.61–1.24)
Chloride: 107 mmol/L (ref 101–111)
Chloride: 107 mmol/L (ref 101–111)
Creatinine, Ser: 0.46 mg/dL — ABNORMAL LOW (ref 0.61–1.24)
GFR calc Af Amer: >60 mL/min (ref >60)
GFR calc Af Amer: >60 mL/min (ref >60)
GFR calc non Af Amer: >60 mL/min (ref >60)
GLUCOSE: 125 mg/dL — AB (ref 65–99)
Glucose, Bld: 104 mg/dL — ABNORMAL HIGH (ref 65–99)
Potassium: 3.9 mmol/L (ref 3.5–5.1)
Potassium: 3.4 mmol/L — ABNORMAL LOW (ref 3.5–5.1)
SODIUM: 138 mmol/L (ref 135–145)
SODIUM: 138 mmol/L (ref 135–145)

## 2017-06-28 LAB — MAGNESIUM: MAGNESIUM: 1.8 mg/dL (ref 1.7–2.4)

## 2017-06-28 LAB — CBC
HEMATOCRIT: 44.3 % (ref 39.0–52.0)
Hemoglobin: 15.1 g/dL (ref 13.0–17.0)
MCH: 28.7 pg (ref 26.0–34.0)
MCHC: 34.1 g/dL (ref 30.0–36.0)
MCV: 84.1 fL (ref 78.0–100.0)
PLATELETS: 212 10*3/uL (ref 150–400)
RBC: 5.27 MIL/uL (ref 4.22–5.81)
RDW: 13.1 % (ref 11.5–15.5)
WBC: 12.7 10*3/uL — AB (ref 4.0–10.5)

## 2017-06-28 LAB — POCT I-STAT 3, ART BLOOD GAS (G3+)
ACID-BASE DEFICIT: 3 mmol/L — AB (ref 0.0–2.0)
Bicarbonate: 21.2 mmol/L (ref 20.0–28.0)
O2 Saturation: 97 %
PH ART: 7.408 (ref 7.350–7.450)
TCO2: 22 mmol/L (ref 0–100)
pCO2 arterial: 33.6 mmHg (ref 32.0–48.0)
pO2, Arterial: 89 mmHg (ref 83.0–108.0)

## 2017-06-28 LAB — PHOSPHORUS: Phosphorus: 1.9 mg/dL — ABNORMAL LOW (ref 2.5–4.6)

## 2017-06-28 LAB — URINE CULTURE: Culture: NO GROWTH

## 2017-06-28 LAB — PROCALCITONIN: Procalcitonin: 0.5 ng/mL

## 2017-06-28 LAB — TRIGLYCERIDES: TRIGLYCERIDES: 399 mg/dL — AB (ref <150)

## 2017-06-28 MED ORDER — MAGNESIUM SULFATE 2 GM/50ML IV SOLN
2.0000 g | Freq: Once | INTRAVENOUS | Status: AC
  Administered 2017-06-28: 2 g via INTRAVENOUS
  Filled 2017-06-28: qty 50

## 2017-06-28 MED ORDER — POTASSIUM CHLORIDE 10 MEQ/50ML IV SOLN
10.0000 meq | INTRAVENOUS | Status: AC
  Administered 2017-06-28 (×2): 10 meq via INTRAVENOUS
  Filled 2017-06-28 (×2): qty 50

## 2017-06-28 MED ORDER — VITAL HIGH PROTEIN PO LIQD: 1000.0000 mL | ORAL | Status: DC

## 2017-06-28 MED ORDER — SODIUM PHOSPHATES 45 MMOLE/15ML IV SOLN
20.0000 mmol | Freq: Once | INTRAVENOUS | Status: AC
  Administered 2017-06-28: 20 mmol via INTRAVENOUS
  Filled 2017-06-28: qty 6.67

## 2017-06-28 MED ORDER — MIDAZOLAM HCL 2 MG/2ML IJ SOLN
1.0000 mg | INTRAMUSCULAR | Status: DC | PRN
  Administered 2017-06-28: 2 mg via INTRAVENOUS
  Filled 2017-06-28: qty 2

## 2017-06-28 MED ORDER — FENTANYL CITRATE (PF) 100 MCG/2ML IJ SOLN
50.0000 ug | Freq: Once | INTRAMUSCULAR | Status: AC
  Administered 2017-06-28: 50 ug via INTRAVENOUS
  Filled 2017-06-28: qty 2

## 2017-06-28 MED ORDER — FENTANYL CITRATE (PF) 100 MCG/2ML IJ SOLN
25.0000 ug | INTRAMUSCULAR | Status: DC | PRN
  Administered 2017-06-28 (×2): 50 ug via INTRAVENOUS
  Administered 2017-06-30 (×2): 25 ug via INTRAVENOUS
  Filled 2017-06-28 (×5): qty 2

## 2017-06-28 MED ORDER — FENTANYL 2500MCG IN NS 250ML (10MCG/ML) PREMIX INFUSION
25.0000 ug/h | INTRAVENOUS | Status: DC
  Administered 2017-06-28: 50 ug/h via INTRAVENOUS
  Administered 2017-06-30: 100 ug/h via INTRAVENOUS
  Filled 2017-06-28 (×2): qty 250

## 2017-06-28 MED ORDER — POTASSIUM CHLORIDE 10 MEQ/50ML IV SOLN: 10.0000 meq | INTRAVENOUS | Status: DC

## 2017-06-28 MED ORDER — PROPOFOL 1000 MG/100ML IV EMUL
0.0000 ug/kg/min | INTRAVENOUS | Status: DC
Start: 2017-06-28 — End: 2017-06-30
  Administered 2017-06-28: 5 ug/kg/min via INTRAVENOUS
  Administered 2017-06-29: 30 ug/kg/min via INTRAVENOUS
  Administered 2017-06-29 (×2): 40 ug/kg/min via INTRAVENOUS
  Filled 2017-06-28 (×4): qty 100

## 2017-06-28 MED ORDER — SODIUM CHLORIDE 0.9 % IV SOLN
1.0000 g | Freq: Once | INTRAVENOUS | Status: AC
  Administered 2017-06-28: 1 g via INTRAVENOUS
  Filled 2017-06-28: qty 10

## 2017-06-28 MED ORDER — FENTANYL BOLUS VIA INFUSION
50.0000 ug | INTRAVENOUS | Status: DC | PRN
  Filled 2017-06-28: qty 50

## 2017-06-28 NOTE — Progress Notes (Signed)
Progress Note  Patient Name: Jeffery Thomas Date of Encounter: 06/28/2017  Primary Cardiologist: New/Berry  Subjective   Intubated, sedated  Inpatient Medications    Scheduled Meds: . chlorhexidine gluconate (MEDLINE KIT)  15 mL Mouth Rinse BID  . Chlorhexidine Gluconate Cloth  6 each Topical Q0600  . fentaNYL (SUBLIMAZE) injection  100 mcg Intravenous Once  . heparin subcutaneous  5,000 Units Subcutaneous Q8H  . insulin aspart  2-6 Units Subcutaneous Q4H  . insulin glargine  10 Units Subcutaneous Q24H  . mouth rinse  15 mL Mouth Rinse 10 times per day  . midazolam  2 mg Intravenous Once  . pantoprazole (PROTONIX) IV  40 mg Intravenous QHS  . sodium chloride flush  10-40 mL Intracatheter Q12H   Continuous Infusions: . sodium chloride 75 mL/hr at 06/27/17 2339  . sodium chloride    . amiodarone 30 mg/hr (06/28/17 0900)  . fentaNYL infusion INTRAVENOUS 300 mcg/hr (06/28/17 0900)  . midazolam (VERSED) infusion 5 mg/hr (06/28/17 0900)  . norepinephrine (LEVOPHED) Adult infusion Stopped (06/28/17 0824)   PRN Meds: sodium chloride, acetaminophen, fentaNYL, midazolam, ondansetron (ZOFRAN) IV, sodium chloride flush   Vital Signs    Vitals:   06/28/17 1000 06/28/17 1100 06/28/17 1103 06/28/17 1200  BP: (!) 87/63 (!) 89/65 (!) 87/63 92/64  Pulse: 65 68 66 71  Resp: '16 18 16 16  ' Temp: (!) 96.3 F (35.7 C) (!) 96.3 F (35.7 C) (!) 96.6 F (35.9 C) (!) 96.6 F (35.9 C)  TempSrc:      SpO2: 97% 99% 98% 98%  Weight:      Height:        Intake/Output Summary (Last 24 hours) at 06/28/17 1312 Last data filed at 06/28/17 1300  Gross per 24 hour  Intake          1843.83 ml  Output             1630 ml  Net           213.83 ml   Filed Weights   06/26/17 2159 06/27/17 0130 06/28/17 0352  Weight: 154 lb 5.2 oz (70 kg) 208 lb 0.8 oz (94.4 kg) 208 lb 8.9 oz (94.6 kg)    Telemetry    Sinus rhythm, few PVC's - Personally Reviewed   Physical Exam  Intubated,  unresponsive, sedated Neck: No JVD Cardiac: RRR, no murmurs, rubs, or gallops. Distant heart sounds Respiratory: Clear to auscultation bilaterally. GI: Soft, nontender, non-distended  MS: mild diffuse edema; No deformity. Neuro:  unresopnsive - on paralytic Psych: unable to assess Skin: no rash  Labs    Chemistry Recent Labs Lab 06/26/17 2137  06/27/17 0146  06/27/17 1542  06/28/17 0036 06/28/17 0444 06/28/17 1214  NA 137  < > 137  < > 139  < > 138 141 138  K 3.4*  < > 3.3*  < > 3.0*  < > 3.9 3.1* 3.4*  CL 98*  < > 99*  < > 105  < > 107 103 107  CO2 17*  --  24  < > 24  --  21*  --  24  GLUCOSE 254*  < > 227*  < > 139*  < > 125* 143* 104*  BUN 22*  < > 21*  < > 12  < > '8 10 8  ' CREATININE 1.60*  < > 0.96  < > 0.62  < > 0.46* 0.40* 0.67  CALCIUM 8.0*  --  8.2*  < > 7.8*  --  8.1*  --  7.9*  PROT 5.9*  --  7.0  --   --   --   --   --   --   ALBUMIN 3.6  --  4.2  --   --   --   --   --   --   AST 218*  --  202*  --   --   --   --   --   --   ALT 134*  --  173*  --   --   --   --   --   --   ALKPHOS 75  --  76  --   --   --   --   --   --   BILITOT 1.0  --  1.1  --   --   --   --   --   --   GFRNONAA 44*  --  >60  < > >60  --  >60  --  >60  GFRAA 51*  --  >60  < > >60  --  >60  --  >60  ANIONGAP 22*  --  14  < > 10  --  10  --  7  < > = values in this interval not displayed.   Hematology Recent Labs Lab 06/26/17 2137  06/27/17 0533  06/28/17 0034 06/28/17 0444 06/28/17 0646  WBC 12.4*  --  22.7*  --   --   --  12.7*  RBC 5.12  --  5.56  --   --   --  5.27  HGB 14.9  < > 16.2  < > 15.6 15.0 15.1  HCT 45.4  < > 47.6  < > 46.0 44.0 44.3  MCV 88.7  --  85.6  --   --   --  84.1  MCH 29.1  --  29.1  --   --   --  28.7  MCHC 32.8  --  34.0  --   --   --  34.1  RDW 13.4  --  13.0  --   --   --  13.1  PLT 242  --  244  --   --   --  212  < > = values in this interval not displayed.  Cardiac Enzymes Recent Labs Lab 06/27/17 0146 06/27/17 0533 06/27/17 0843  06/27/17 1542  TROPONINI 2.03* 3.27* 3.19* 2.78*    Recent Labs Lab 06/26/17 2152  TROPIPOC 0.16*     BNPNo results for input(s): BNP, PROBNP in the last 168 hours.   DDimer No results for input(s): DDIMER in the last 168 hours.   Radiology    Dg Chest Port 1 View  Result Date: 06/27/2017 CLINICAL DATA:  Acute respiratory failure with hypoxia EXAM: PORTABLE CHEST 1 VIEW COMPARISON:  06/27/2017 FINDINGS: Endotracheal tube has been advanced with the tip 4-5 cm above the carina. NG tube and left central line remain in place, unchanged. Cardiomegaly with vascular congestion. Bilateral lower lobe atelectasis or infiltrates, increased since prior study. IMPRESSION: Cardiomegaly, vascular congestion. Increasing bibasilar atelectasis or infiltrates. Electronically Signed   By: Rolm Baptise M.D.   On: 06/27/2017 08:43   Dg Chest Port 1 View  Result Date: 06/27/2017 CLINICAL DATA:  Central line placement EXAM: PORTABLE CHEST 1 VIEW COMPARISON:  06/26/2017 FINDINGS: New left IJ central line catheter tip is seen at the cavoatrial junction without pneumothorax. Low lung volumes with interstitial edema and cardiomegaly. Tortuous thoracic aorta. Endotracheal  tube tip is approximately 8.5 cm above the carina. Gastric tube extends below the left hemidiaphragm. A pH probe is seen in the distal esophagus. There appear to be ice packs project over the base of the neck and right arm. IMPRESSION: 1. Left IJ central line catheter at the cavoatrial junction. No pneumothorax. 2. Satisfactory support line and tube positions. 3. Stable cardiomegaly with mild interstitial edema. 4. Aortic atherosclerosis. Electronically Signed   By: Ashley Royalty M.D.   On: 06/27/2017 01:02   Dg Chest Portable 1 View  Result Date: 06/26/2017 CLINICAL DATA:  66 y/o  M; post CPR and intubation. EXAM: PORTABLE CHEST 1 VIEW COMPARISON:  None. FINDINGS: Mildly enlarged cardiac silhouette given projection and technique. Endotracheal tube tip  is 9.5 cm from the carina. Pulmonary vascular congestion. No pneumothorax. The no focal consolidation. No acute osseous abnormality is evident. IMPRESSION: Mildly enlarged cardiac silhouette. Pulmonary vascular congestion. Endotracheal tube tip 9.5 cm from carina, consider advancement. Electronically Signed   By: Kristine Garbe M.D.   On: 06/26/2017 22:11   Dg Abd Portable 1v  Result Date: 06/27/2017 CLINICAL DATA:  Check gastric catheter placement EXAM: PORTABLE ABDOMEN - 1 VIEW COMPARISON:  None. FINDINGS: Scattered large and small bowel gas is noted. A gastric catheter is noted within the stomach. No acute bony abnormality is seen. No mass lesion is noted. IMPRESSION: Gastric catheter within the stomach. Electronically Signed   By: Inez Catalina M.D.   On: 06/27/2017 07:42    Cardiac Studies   Cardiac Cath: Coronary Diagrams   Diagnostic Diagram        2D Echo: pending   Patient Profile     66 y.o. male with history of HTN, HLD, GERD admitted following out of hospital cardiac arrest. He reported chest pain to his wife then collapsed in the other room. AED applied by EMS and pt was shocked. He then had PEA and then v. Fib with ROSC with 20 minutes of CPR. Emergent cardiac cath with normal coronary arteries. LVEF severely reduced at 25%.   Assessment & Plan    Ventricular fibrillation cardiac arrest: The patient is in the rewarming process of hypothermia. I discussed issues around neurologic recovery extensively with the patient's wife and daughter. They understand that until he is fully rewarmed off of paralytics and sedatives that we will not be able to prognosticate his recovery. We should know much more about his neurologic status tomorrow. All of their questions are answered today. His echocardiogram is currently pending. It appears he has a nonischemic cardiomyopathy that likely precipitated sudden cardiac arrest with ventricular fibrillation. Depending on his neurologic  recovery will have EP consultation performed for consideration of ICD implant. For now he continues on IV amiodarone. His QT interval is significantly prolonged on amiodarone with hypothermia. Hopefully this will improve as he rewarmed. Will stop amiodarone and monitor his rhythm closely.  Deatra James, MD  06/28/2017, 1:12 PM

## 2017-06-28 NOTE — Progress Notes (Signed)
eLink Physician-Brief Progress Note Patient Name: Jeffery Thomas DOB: [birth year] MRN: [id number]   Date of Service  06/28/2017  HPI/Events of Note  K+ = 3.1, ionized Ca++ = 1.12 and Creatinine = 0.40.  eICU Interventions  Will order: 1. Replace K+ and Ca++.     Intervention Category Major Interventions: Electrolyte abnormality - evaluation and management  Lilith Solana Eugene 06/28/2017, 4:56 AM

## 2017-06-28 NOTE — Progress Notes (Signed)
PULMONARY / CRITICAL CARE MEDICINE   Name: Jeffery Thomas MRN: [id number] DOB: 1951-01-19    ADMISSION DATE:  06/26/2017 CONSULTATION DATE:  06/26/17  REFERRING MD:  Aundra Dubin  CHIEF COMPLAINT:  Cardiac Arrest  Brief:   66 year old male presents to ED 8/20 after cardiac arrest (ACLS about 20 minutes) found to have inferior STEMI. Emergent Cath performed with normal coronary arteries and severely reduced EF of 25%.   SUBJECTIVE:   Episode of emesis overnight. T-max 100.6. Reached goal temp at 2 pm yesterday   VITAL SIGNS: BP (!) 148/95   Pulse 100   Temp 98.4 F (36.9 C)   Resp 15   Ht 6\' 2"  (1.88 m)   Wt 94.6 kg (208 lb 8.9 oz)   SpO2 100%   BMI 26.78 kg/m   HEMODYNAMICS: CVP:  [5 mmHg-9 mmHg] 7 mmHg  VENTILATOR SETTINGS: Vent Mode: PRVC FiO2 (%):  [40 %] 40 % Set Rate:  [16 bmp-18 bmp] 16 bmp Vt Set:  [640 mL] 640 mL PEEP:  [5 cmH20] 5 cmH20 Plateau Pressure:  [10 cmH20-19 cmH20] 15 cmH20  INTAKE / OUTPUT: I/O last 3 completed shifts: In: 4381.7 [I.V.:3901.7; NG/GT:30; IV Piggyback:450] Out: 71 [Urine:4180; Emesis/NG output:300; Stool:100]   PHYSICAL EXAMINATION: General appearance: Male, no distress Neuro: Sedated, awakens to physical stimulation, not purposeful, moves extremities   Eyes: anicteric sclerae icteric , moist conjunctivae Mouth:  Dry MM, ETT in place   Neck: Trachea midline; neck supple, no JVD Lungs/chest: Rhonchi, non-labored, no wheeze/crackles  CV: RRR, no MRGs  Abdomen: Soft, non-tender; active bowel sounds  Extremities: No peripheral edema  Skin: Cool, dry     LABS:  BMET  Recent Labs Lab 06/27/17 1542  06/28/17 0036 06/28/17 0444 06/28/17 1214  NA 139  < > 138 141 138  K 3.0*  < > 3.9 3.1* 3.4*  CL 105  < > 107 103 107  CO2 24  --  21*  --  24  BUN 12  < > 8 10 8   CREATININE 0.62  < > 0.46* 0.40* 0.67  GLUCOSE 139*  < > 125* 143* 104*  < > = values in this interval not displayed.  Electrolytes  Recent Labs Lab  06/27/17 0146 06/27/17 0533  06/27/17 1542 06/28/17 0036 06/28/17 1214  CALCIUM 8.2* 7.6*  < > 7.8* 8.1* 7.9*  MG 2.0 1.9  --   --  1.8  --   PHOS  --  4.3  --   --  1.9*  --   < > = values in this interval not displayed.  CBC  Recent Labs Lab 06/26/17 2137  06/27/17 0533  06/28/17 0034 06/28/17 0444 06/28/17 0646  WBC 12.4*  --  22.7*  --   --   --  12.7*  HGB 14.9  < > 16.2  < > 15.6 15.0 15.1  HCT 45.4  < > 47.6  < > 46.0 44.0 44.3  PLT 242  --  244  --   --   --  212  < > = values in this interval not displayed.  Coag's  Recent Labs Lab 06/26/17 2137 06/27/17 0146 06/27/17 0730  APTT 20* 21* 23*  INR 1.14 1.08 1.08    Sepsis Markers  Recent Labs Lab 06/26/17 2154 06/27/17 0811 06/28/17 0036  LATICACIDVEN 11.42*  --   --   PROCALCITON  --  0.41 0.50    ABG  Recent Labs Lab 06/27/17 0804 06/28/17 0459 06/28/17 0912  PHART 7.398 7.531* 7.408  PCO2ART 38.7 24.7* 33.6  PO2ART 163.0* 113* 89.0    Liver Enzymes  Recent Labs Lab 06/26/17 2137 06/27/17 0146  AST 218* 202*  ALT 134* 173*  ALKPHOS 75 76  BILITOT 1.0 1.1  ALBUMIN 3.6 4.2    Cardiac Enzymes  Recent Labs Lab 06/27/17 0533 06/27/17 0843 06/27/17 1542  TROPONINI 3.27* 3.19* 2.78*    Glucose  Recent Labs Lab 06/27/17 1947 06/27/17 2343 06/28/17 0442 06/28/17 0723 06/28/17 1154 06/28/17 1636  GLUCAP 162* 137* 141* 144* 105* 133*    Imaging No results found.  STUDIES:  LHC 8/20: There is severe left ventricular systolic dysfunction, LV end diastolic pressure is mildly elevated.The left ventricular ejection fraction is less than 25% by visual estimate. Configuration consistent with Takatsubo Syndrome PORT CXR 8/21: Endotracheal tube approximately 8 cm from carina. Enteric feeding tube coursing below diaphragm. Left internal jugular central venous catheter in good position. No focal opacity or effusion appreciated. TTE 8/21 >>> EEG 8/21 > Diffuse background  suppression and slowing, occasional focal slowing over the right temporal region  CT Head 8/22 > No acute  CT C Spine 8/22 > No acute, multilevel segmentation anomaly   MICROBIOLOGY: MRSA PCR 8/21:  Negative Blood Cultures x2 8/21 >>> Urine Culture 8/21 > negative  Tracheal Aspirate Culture 8/21 >>> Blood 8/23 >> Sputum 8/23 >> U/A 8/23 >>  ANTIBIOTICS: Zosyn 8/23 >>   SIGNIFICANT EVENTS: 8/20 - Admit & started therapeutic hypothermia post arrest 8/22 rewarm  LINES/TUBES: OETT 7.0 8/20 >  L [place] CVL 8/21 >>> L RAD ART LINE 8/21 >>> OGT >>> FOLEY >>> PIV  ASSESSMENT / PLAN:  PULMONARY A: Acute hypoxic respiratory failure: In the setting of cardiac arrest. -ABG 8/22 7.531/24.7/113 P:   Full Vent Support  Wean as tolerated    Wean FiO2 for saturation > 94 Trend CXR/ABG  CARDIOVASCULAR A:  Inferior STEMI > Clear Vessels on Cath  Prolonged QTC  -Ammio gtt stopped 8/22 Non-Ischemic Cardiomyopathy  Takatsubo Cardiomyopathy ? H/O HTN P:  Per Cardiology > May need EP consult for ICD  Cardiac Monitoring  EKG Daily  MAP goal > 65 (Currently off all pressors)  Hold preadmission amlodipine, hydrodiuril, toprol-xl.  RENAL A:   Hypokalemia Hypophosphorous  Hypomagnesemia  Acute renal failure: Resolved. P:   Strict I&0 Trend BMP Replace Electrolytes as needed   GASTROINTESTINAL A:   No acute issues. P:   NPO PPI   HEMATOLOGIC A:   Leukocytosis: Unclear etiology. > Improving  P:  Trend CBC Daily   INFECTIOUS A:   Aspiration Event? UTI? > Urine cloudy with sediment  -PCT 0.41 > 0.50 > 2.0  P:   Trend WBC and Fever Curve Resend Cultures > Follow data   Start Zosyn   ENDOCRINE A:   Hyperglycemia: Controlled. P:   Trend Glucose  SSI Lantus 10 u Daily   NEUROLOGIC A:   Acute encephalopathy: Question possible anoxic brain injury post arrest. Sedation on ventilator & paralytic -EEG 8/21 > Diffuse background suppression and slowing, occasional  focal slowing over the right temporal region  -CT Head Negative on 8/22  -Reached goal temp for rewarming at 1400 8/22  P:   Sedation:  Propofol gtt / Fentanyl gtt/Midazolam PRN. RASS goal: -1/-2 Neurology Consult if no improvement    FAMILY  - Updates: Wife updated at bedside   - Inter-disciplinary family meet or Palliative Care meeting due by:  07/03/2017   CC Time: 53  Hayden Pedro, AGACNP-BC [facility]  Pgr: [phone]  PCCM Pgr: [phone]   STAFF NOTE: Linwood Dibbles, MD FACP have personally reviewed patient's available data, including medical history, events of note, physical examination and test results as part of my evaluation. I have discussed with resident/NP and other care providers such as pharmacist, RN and RRT. In addition, I personally evaluated patient and elicited key findings of: eyes open, not following commands, shivering, ronchi, jvd noted, collar, abdo soft, slight chronic diffuse erythema upper ext, edema lower, pcxr which I reviewed shows increased diffsue int changes, et wnl, with WUA he is improved but not following commands as of yet, CT I reviewed does not show anoxia, CT neck I reviewed shows chronci changes without acute changes, we need to continue blankets to keep tempt normo, NO dc collar until able to clear clinically, treat shivering aggressive meperidine, may need to change prop to precedex, would dc a line, keep same MV on vent, SBT if able to reduce sedation enough, no extubation planned, lasix start, kvo , mag supp, repeat UA, adding zosyn, no role eeg at this stage, feeding on hold from asp event, obtain kub, goal neg 1 liter, cuation reglan use with prior qtc, I updated wife in full, concerns remains for diffuse rash -- add IV steroids low dose The patient is critically ill with multiple organ systems failure and requires high complexity decision making for assessment and support, frequent evaluation and  titration of therapies, application of advanced monitoring technologies and extensive interpretation of multiple databases.   Critical Care Time devoted to patient care services described in this note is35 Minutes. This time reflects time of care of this signee: Merrie Roof, MD FACP. This critical care time does not reflect procedure time, or teaching time or supervisory time of PA/NP/Med student/Med Resident etc but could involve care discussion time. Rest per NP/medical resident whose note is outlined above and that I agree with   Lavon Paganini. Titus Mould, MD, [place] Pgr: [facility] 06/29/2017 8:11 AM

## 2017-06-28 NOTE — Progress Notes (Signed)
  Echocardiogram 2D Echocardiogram has been performed.  Jeffery Thomas 06/28/2017, 11:40 AM

## 2017-06-28 NOTE — Progress Notes (Signed)
50 cc of Fentanyl and 10 cc of Versed wasted in sink witnessed by Allena Katz, RN & Raj Janus, RN

## 2017-06-28 NOTE — Progress Notes (Signed)
PULMONARY / CRITICAL CARE MEDICINE   Name: Jeffery Thomas MRN: [id number] DOB: 02-05-1951    ADMISSION DATE:  06/26/2017 CONSULTATION DATE:  06/26/17  REFERRING MD:  Aundra Dubin  CHIEF COMPLAINT:  Cardiac Arrest  Brief:   66 year old male presents to ED 8/20 after cardiac arrest (ACLS about 20 minutes) found to have inferior STEMI. Emergent Cath performed with normal coronary arteries and severely reduced EF of 25%.   SUBJECTIVE:   Rewarmed at 2 am   VITAL SIGNS: BP 110/81   Pulse (!) 54   Temp (!) 93.9 F (34.4 C)   Resp 18   Ht 6\' 2"  (1.88 m)   Wt 94.6 kg (208 lb 8.9 oz)   SpO2 100%   BMI 26.78 kg/m   HEMODYNAMICS: CVP:  [5 mmHg-12 mmHg] 5 mmHg  VENTILATOR SETTINGS: Vent Mode: PRVC FiO2 (%):  [40 %-80 %] 40 % Set Rate:  [18 bmp] 18 bmp Vt Set:  [640 mL] 640 mL PEEP:  [5 cmH20] 5 cmH20 Plateau Pressure:  [11 cmH20-19 cmH20] 18 cmH20  INTAKE / OUTPUT: I/O last 3 completed shifts: In: 4310.2 [I.V.:3830.2; NG/GT:30; IV Piggyback:450] Out: 2683 [Urine:4180; Emesis/NG output:300; Stool:100]   PHYSICAL EXAMINATION: General appearance: Adult Male, no distress  Eyes: anicteric sclerae icteric , moist conjunctivae  Mouth:  ETT in place, membranes and no mucosal ulcerations; normal hard and soft palate Neck: Trachea midline; neck supple, no JVD Lungs/chest: CTA, with normal respiratory effort and no intercostal retractions CV: Brady, no MRGs  Abdomen: Soft, non-tender; no masses or HSM Extremities: No peripheral edema or extremity lymphadenopathy Skin: Cool, dry, intact  Psych: Sedated, medically paralyzed   LABS:  BMET  Recent Labs Lab 06/27/17 0811  06/27/17 1542 06/28/17 0034 06/28/17 0036 06/28/17 0444  NA 135  < > 139 140 138 141  K 3.9  < > 3.0* 4.0 3.9 3.1*  CL 103  < > 105 104 107 103  CO2 22  --  24  --  21*  --   BUN 16  < > 12 12 8 10   CREATININE 0.68  < > 0.62 0.40* 0.46* 0.40*  GLUCOSE 147*  < > 139* 143* 125* 143*  < > = values in this  interval not displayed.  Electrolytes  Recent Labs Lab 06/27/17 0146 06/27/17 0533 06/27/17 0811 06/27/17 1542 06/28/17 0036  CALCIUM 8.2* 7.6* 7.6* 7.8* 8.1*  MG 2.0 1.9  --   --  1.8  PHOS  --  4.3  --   --  1.9*    CBC  Recent Labs Lab 06/26/17 2137  06/27/17 0533  06/28/17 0034 06/28/17 0444 06/28/17 0646  WBC 12.4*  --  22.7*  --   --   --  12.7*  HGB 14.9  < > 16.2  < > 15.6 15.0 15.1  HCT 45.4  < > 47.6  < > 46.0 44.0 44.3  PLT 242  --  244  --   --   --  212  < > = values in this interval not displayed.  Coag's  Recent Labs Lab 06/26/17 2137 06/27/17 0146 06/27/17 0730  APTT 20* 21* 23*  INR 1.14 1.08 1.08    Sepsis Markers  Recent Labs Lab 06/26/17 2154 06/27/17 0811 06/28/17 0036  LATICACIDVEN 11.42*  --   --   PROCALCITON  --  0.41 0.50    ABG  Recent Labs Lab 06/27/17 0412 06/27/17 0804 06/28/17 0459  PHART 7.258* 7.398 7.531*  PCO2ART 56.1*  38.7 24.7*  PO2ART 96.3 163.0* 113*    Liver Enzymes  Recent Labs Lab 06/26/17 2137 06/27/17 0146  AST 218* 202*  ALT 134* 173*  ALKPHOS 75 76  BILITOT 1.0 1.1  ALBUMIN 3.6 4.2    Cardiac Enzymes  Recent Labs Lab 06/27/17 0533 06/27/17 0843 06/27/17 1542  TROPONINI 3.27* 3.19* 2.78*    Glucose  Recent Labs Lab 06/27/17 1400 06/27/17 1503 06/27/17 1555 06/27/17 1658 06/27/17 1947 06/27/17 2343  GLUCAP 157* 136* 130* 147* 162* 137*    Imaging Dg Chest Port 1 View  Result Date: 06/27/2017 CLINICAL DATA:  Acute respiratory failure with hypoxia EXAM: PORTABLE CHEST 1 VIEW COMPARISON:  06/27/2017 FINDINGS: Endotracheal tube has been advanced with the tip 4-5 cm above the carina. NG tube and left central line remain in place, unchanged. Cardiomegaly with vascular congestion. Bilateral lower lobe atelectasis or infiltrates, increased since prior study. IMPRESSION: Cardiomegaly, vascular congestion. Increasing bibasilar atelectasis or infiltrates. Electronically Signed    By: Rolm Baptise M.D.   On: 06/27/2017 08:43    STUDIES:  LHC 8/20: There is severe left ventricular systolic dysfunction, LV end diastolic pressure is mildly elevated.The left ventricular ejection fraction is less than 25% by visual estimate. Configuration consistent with Takatsubo Syndrome PORT CXR 8/21: Endotracheal tube approximately 8 cm from carina. Enteric feeding tube coursing below diaphragm. Left internal jugular central venous catheter in good position. No focal opacity or effusion appreciated. TTE 8/21 >>> EEG 8/21 > Diffuse background suppression and slowing, occasional focal slowing over the right temporal region   MICROBIOLOGY: MRSA PCR 8/21:  Negative Blood Cultures x2 8/21 >>> Urine Culture 8/21 >>> Tracheal Aspirate Culture 8/21 >>>  ANTIBIOTICS: None.  SIGNIFICANT EVENTS: 8/20 - Admit & started therapeutic hypothermia post arrest  LINES/TUBES: OETT 7.0 8/20 >  L [place] CVL 8/21 >>> L RAD ART LINE 8/21 >>> OGT >>> FOLEY >>> PIV  ASSESSMENT / PLAN:  PULMONARY A: Acute hypoxic respiratory failure: In the setting of cardiac arrest. -ABG 8/22 7.531/24.7/113 P:   Full Vent Support  Continue to hold wean due to sedation/re-warming  Wean FiO2 for saturation > 94 Decrease Rate to 16 > ABG in one hour  Trend CXR/ABG  CARDIOVASCULAR A:  Inferior STEMI > Clear Vessels on Cath  Non-Ischemic Cardiomyopathy  Takatsubo Cardiomyopathy ? H/O HTN P:  Per Cardiology > May need EP consult for ICD  Cardiac Monitoring  Echocardiogram pending Continue Amiodarone > QTC prolonged to 596 8/22 this AM > will relay to cardiology to see if d/c needed  Continue MAP goal of > 80 until rewarmed to 36  Hold preadmission amlodipine, hydrodiuril, toprol-xl.  RENAL A:   Hypokalemia Hypophosphorous  Hypomagnesemia  Acute renal failure: Resolved. P:   Strict I&0 Trend BMP Replace Electrolytes as needed  Continue NS @ 75 ml/hr   GASTROINTESTINAL A:   No acute issues. P:    NPO Will start TF when rewarmed  HEMATOLOGIC A:   Leukocytosis: Unclear etiology. P:  Trend CBC Daily   INFECTIOUS A:   No indication of infection. -PCT 0.41 > 0.50 P:   Trend WBC and Fever Curve Follow Culture Data   ENDOCRINE A:   Hyperglycemia: Controlled. P:   Trend Glucose  SSI Lantus 10 u Daily   NEUROLOGIC A:   Acute encephalopathy: Question possible anoxic brain injury post arrest. Sedation on ventilator & paralytic -EEG 8/21 > Diffuse background suppression and slowing, occasional focal slowing over the right temporal region  P:  Sedation:  Propofol gtt / Fentanyl gtt / Cisatracurium gtt / Midazolam PRN. RASS goal: -5 while under NMB Currently undergoing rewarming  Holding off on CT head for now Neuro consult once rewarmed.    FAMILY  - Updates: no family at bedside   - Inter-disciplinary family meet or Palliative Care meeting due by:  07/03/2017   CC Time: [place], AGACNP-BC [place] [facility]  Pgr: [phone]  PCCM Pgr: [phone]

## 2017-06-28 NOTE — Progress Notes (Signed)
[facility] ADULT ICU REPLACEMENT PROTOCOL FOR AM LAB REPLACEMENT ONLY  The patient does apply for the [facility] Adult ICU Electrolyte Replacment Protocol based on the criteria listed below:   1. Is GFR >/= 40 ml/min? Yes.    Patient's GFR today is >60 2. Is urine output >/= 0.5 ml/kg/hr for the last 6 hours? Yes.   Patient's UOP is 1.02 ml/kg/hr 3. Is BUN < 60 mg/dL? Yes.    Patient's BUN today is 8 4. Abnormal electrolyte(s): phosphate 1.9 5. Ordered repletion with: per protocol 6. If a panic level lab has been reported, has the CCM MD in charge been notified? Yes.  .   Physician:  Dr Madelin Rear, Philis Nettle 06/28/2017 4:04 AM

## 2017-06-28 NOTE — Plan of Care (Signed)
  Interdisciplinary Goals of Care Family Meeting   Date carried out:: 06/28/2017  Location of the meeting: Bedside  Member's involved: Physician, Bedside Registered Nurse and Family Member or next of kin  Durable Power of Attorney or acting medical decision maker: Wife    Discussion: We discussed goals of care for Jeffery Thomas .  Updated wife & daughter at bedside. Patient now rewarmed. Had some blinking & coughing. Explained that we will awaiting neurologic recovery for 72 hours. Obtaining CT head & C-spine today given risk of anoxic injury and trauma. Explained guarded prognosis.   Code status: Full Code  Disposition: Continue current acute care  Time spent for the meeting: 7 minutes   Jeffery Thomas 06/28/2017, 2:16 PM

## 2017-06-29 ENCOUNTER — Inpatient Hospital Stay (HOSPITAL_COMMUNITY)

## 2017-06-29 DIAGNOSIS — Z452 Encounter for adjustment and management of vascular access device: Secondary | ICD-10-CM

## 2017-06-29 DIAGNOSIS — Z0189 Encounter for other specified special examinations: Secondary | ICD-10-CM

## 2017-06-29 LAB — URINALYSIS, ROUTINE W REFLEX MICROSCOPIC
Bilirubin Urine: NEGATIVE
Glucose, UA: 100 mg/dL — AB
Ketones, ur: NEGATIVE mg/dL
LEUKOCYTES UA: NEGATIVE
NITRITE: POSITIVE — AB
PH: 5.5 (ref 5.0–8.0)
Protein, ur: 100 mg/dL — AB

## 2017-06-29 LAB — PHOSPHORUS: Phosphorus: 3.3 mg/dL (ref 2.5–4.6)

## 2017-06-29 LAB — CBC WITH DIFFERENTIAL/PLATELET
BASOS PCT: 0 %
Basophils Absolute: 0 10*3/uL (ref 0.0–0.1)
EOS PCT: 0 %
Eosinophils Absolute: 0 10*3/uL (ref 0.0–0.7)
HEMATOCRIT: 44.2 % (ref 39.0–52.0)
Hemoglobin: 14.6 g/dL (ref 13.0–17.0)
LYMPHS ABS: 0.9 10*3/uL (ref 0.7–4.0)
Lymphocytes Relative: 8 %
MCH: 29.1 pg (ref 26.0–34.0)
MCHC: 33 g/dL (ref 30.0–36.0)
MCV: 88 fL (ref 78.0–100.0)
MONOS PCT: 3 %
Monocytes Absolute: 0.3 10*3/uL (ref 0.1–1.0)
NEUTROS PCT: 89 %
Neutro Abs: 10.2 10*3/uL — ABNORMAL HIGH (ref 1.7–7.7)
Platelets: 181 10*3/uL (ref 150–400)
RBC: 5.02 MIL/uL (ref 4.22–5.81)
RDW: 14.1 % (ref 11.5–15.5)
WBC MORPHOLOGY: INCREASED
WBC: 11.4 10*3/uL — AB (ref 4.0–10.5)

## 2017-06-29 LAB — PROCALCITONIN
PROCALCITONIN: 1.92 ng/mL
Procalcitonin: 2 ng/mL

## 2017-06-29 LAB — GLUCOSE, CAPILLARY
GLUCOSE-CAPILLARY: 138 mg/dL — AB (ref 65–99)
Glucose-Capillary: 124 mg/dL — ABNORMAL HIGH (ref 65–99)
Glucose-Capillary: 125 mg/dL — ABNORMAL HIGH (ref 65–99)
Glucose-Capillary: 125 mg/dL — ABNORMAL HIGH (ref 65–99)
Glucose-Capillary: 92 mg/dL (ref 65–99)
Glucose-Capillary: 92 mg/dL (ref 65–99)

## 2017-06-29 LAB — BASIC METABOLIC PANEL
Anion gap: 7 (ref 5–15)
BUN: 8 mg/dL (ref 6–20)
CO2: 24 mmol/L (ref 22–32)
CREATININE: 0.74 mg/dL (ref 0.61–1.24)
Calcium: 7.9 mg/dL — ABNORMAL LOW (ref 8.9–10.3)
Chloride: 106 mmol/L (ref 101–111)
GFR calc Af Amer: >60 mL/min (ref >60)
Glucose, Bld: 123 mg/dL — ABNORMAL HIGH (ref 65–99)
POTASSIUM: 4 mmol/L (ref 3.5–5.1)
SODIUM: 137 mmol/L (ref 135–145)

## 2017-06-29 LAB — URINALYSIS, MICROSCOPIC (REFLEX): RBC / HPF: NONE SEEN RBC/hpf (ref 0–5)

## 2017-06-29 LAB — ECHOCARDIOGRAM COMPLETE
Height: 74 in
WEIGHTICAEL: 3336.88 [oz_av]

## 2017-06-29 LAB — MAGNESIUM: Magnesium: 1.9 mg/dL (ref 1.7–2.4)

## 2017-06-29 LAB — CULTURE, RESPIRATORY W GRAM STAIN

## 2017-06-29 LAB — CULTURE, RESPIRATORY: CULTURE: NORMAL

## 2017-06-29 MED ORDER — MAGNESIUM SULFATE 2 GM/50ML IV SOLN
2.0000 g | Freq: Once | INTRAVENOUS | Status: AC
  Administered 2017-06-29: 2 g via INTRAVENOUS
  Filled 2017-06-29: qty 50

## 2017-06-29 MED ORDER — SODIUM CHLORIDE 0.9 % IV SOLN: INTRAVENOUS | Status: DC

## 2017-06-29 MED ORDER — PIPERACILLIN-TAZOBACTAM 3.375 G IVPB
3.3750 g | Freq: Three times a day (TID) | INTRAVENOUS | Status: DC
  Administered 2017-06-29 – 2017-07-02 (×10): 3.375 g via INTRAVENOUS
  Filled 2017-06-29 (×13): qty 50

## 2017-06-29 MED ORDER — SODIUM CHLORIDE 0.9 % IV SOLN
0.4000 ug/kg/h | INTRAVENOUS | Status: DC
  Administered 2017-06-29: 0.4 ug/kg/h via INTRAVENOUS
  Administered 2017-06-29 (×3): 0.8 ug/kg/h via INTRAVENOUS
  Filled 2017-06-29 (×5): qty 2

## 2017-06-29 MED ORDER — ACETAMINOPHEN 650 MG RE SUPP
650.0000 mg | Freq: Four times a day (QID) | RECTAL | Status: DC | PRN
  Administered 2017-06-29 – 2017-06-30 (×2): 650 mg via RECTAL
  Filled 2017-06-29 (×2): qty 1

## 2017-06-29 MED ORDER — MEPERIDINE HCL 25 MG/ML IJ SOLN
25.0000 mg | INTRAMUSCULAR | Status: DC | PRN
  Administered 2017-06-29 (×4): 25 mg via INTRAVENOUS
  Filled 2017-06-29 (×4): qty 1

## 2017-06-29 MED ORDER — FUROSEMIDE 10 MG/ML IJ SOLN
40.0000 mg | Freq: Two times a day (BID) | INTRAMUSCULAR | Status: DC
  Administered 2017-06-29 – 2017-06-30 (×3): 40 mg via INTRAVENOUS
  Filled 2017-06-29 (×3): qty 4

## 2017-06-29 MED ORDER — METHYLPREDNISOLONE SODIUM SUCC 40 MG IJ SOLR
20.0000 mg | INTRAMUSCULAR | Status: AC
  Administered 2017-06-29 – 2017-07-01 (×3): 20 mg via INTRAVENOUS
  Filled 2017-06-29 (×3): qty 1

## 2017-06-29 NOTE — Progress Notes (Addendum)
PULMONARY / CRITICAL CARE MEDICINE   Name: Jeffery Thomas MRN: [id number] DOB: February 04, 1951    ADMISSION DATE:  06/26/2017 CONSULTATION DATE:  06/26/17  REFERRING MD:  Aundra Dubin  CHIEF COMPLAINT:  Cardiac Arrest  Brief:   66 year old male presents to ED 8/20 after cardiac arrest (ACLS about 20 minutes) found to have inferior STEMI. Emergent Cath performed with normal coronary arteries and severely reduced EF of 25%.   SUBJECTIVE:   Self extubated this AM. Currently on 3L [place].   VITAL SIGNS: BP (!) 128/92   Pulse 89   Temp 98.3 F (36.8 C) (Oral)   Resp 13   Ht 6\' 2"  (1.88 m)   Wt 98.1 kg (216 lb 4.3 oz)   SpO2 98%   BMI 27.77 kg/m   HEMODYNAMICS:    VENTILATOR SETTINGS: Vent Mode: PRVC FiO2 (%):  [40 %] 40 % Set Rate:  [16 bmp] 16 bmp Vt Set:  [640 mL] 640 mL PEEP:  [5 cmH20] 5 cmH20 Plateau Pressure:  [15 cmH20-25 cmH20] 19 cmH20  INTAKE / OUTPUT: I/O last 3 completed shifts: In: 1777.1 [I.V.:1477.1; IV Piggyback:300] Out: 1900 [PNTIR:4431; Emesis/NG output:425; Stool:20]   PHYSICAL EXAMINATION: General appearance: Male, no distress Neuro: Awake, follows commands, moves extremities   Eyes: anicteric sclerae icteric , moist conjunctivae Mouth:  MMM Neck: Trachea midline; neck supple, no JVD Lungs/chest: Rhonchi, non-labored  CV: RRR, no MRGs  Abdomen: Soft, non-tender; active bowel sounds  Extremities: No peripheral edema  Skin: Cool, dry, Intact     LABS:  BMET  Recent Labs Lab 06/28/17 0036 06/28/17 0444 06/28/17 1214 06/29/17 0301  NA 138 141 138 137  K 3.9 3.1* 3.4* 4.0  CL 107 103 107 106  CO2 21*  --  24 24  BUN 8 10 8 8   CREATININE 0.46* 0.40* 0.67 0.74  GLUCOSE 125* 143* 104* 123*    Electrolytes  Recent Labs Lab 06/27/17 0533  06/28/17 0036 06/28/17 1214 06/29/17 0301  CALCIUM 7.6*  < > 8.1* 7.9* 7.9*  MG 1.9  --  1.8  --  1.9  PHOS 4.3  --  1.9*  --  3.3  < > = values in this interval not displayed.  CBC  Recent  Labs Lab 06/27/17 0533  06/28/17 0444 06/28/17 0646 06/29/17 0301  WBC 22.7*  --   --  12.7* 11.4*  HGB 16.2  < > 15.0 15.1 14.6  HCT 47.6  < > 44.0 44.3 44.2  PLT 244  --   --  212 181  < > = values in this interval not displayed.  Coag's  Recent Labs Lab 06/26/17 2137 06/27/17 0146 06/27/17 0730  APTT 20* 21* 23*  INR 1.14 1.08 1.08    Sepsis Markers  Recent Labs Lab 06/26/17 2154  06/28/17 0036 06/29/17 0301 06/29/17 0957  LATICACIDVEN 11.42*  --   --   --   --   PROCALCITON  --   < > 0.50 2.00 1.92  < > = values in this interval not displayed.  ABG  Recent Labs Lab 06/27/17 0804 06/28/17 0459 06/28/17 0912  PHART 7.398 7.531* 7.408  PCO2ART 38.7 24.7* 33.6  PO2ART 163.0* 113* 89.0    Liver Enzymes  Recent Labs Lab 06/26/17 2137 06/27/17 0146  AST 218* 202*  ALT 134* 173*  ALKPHOS 75 76  BILITOT 1.0 1.1  ALBUMIN 3.6 4.2    Cardiac Enzymes  Recent Labs Lab 06/27/17 0533 06/27/17 0843 06/27/17 1542  TROPONINI  3.27* 3.19* 2.78*    Glucose  Recent Labs Lab 06/28/17 1636 06/28/17 2010 06/28/17 2320 06/29/17 0309 06/29/17 0810 06/29/17 1127  GLUCAP 133* 108* 108* 125* 92 92    Imaging Dg Abd 1 View  Result Date: 06/29/2017 CLINICAL DATA:  Abdominal distention EXAM: ABDOMEN - 1 VIEW COMPARISON:  06/27/2017 FINDINGS: NG tube tip is in the mid to distal stomach. Nonobstructive bowel gas pattern. No organomegaly or evidence of free air. IMPRESSION: NG tube tip in the mid to distal stomach. Nonobstructive bowel gas pattern. Electronically Signed   By: Rolm Baptise M.D.   On: 06/29/2017 08:58   Ct Head Wo Contrast  Result Date: 06/28/2017 CLINICAL DATA:  Cardiac arrest August 20th, ACLS time 20 minutes. EXAM: CT HEAD WITHOUT CONTRAST CT CERVICAL SPINE WITHOUT CONTRAST TECHNIQUE: Multidetector CT imaging of the head and cervical spine was performed following the standard protocol without intravenous contrast. Multiplanar CT image  reconstructions of the cervical spine were also generated. COMPARISON:  None. FINDINGS: CT HEAD FINDINGS BRAIN: No intraparenchymal hemorrhage, mass effect nor midline shift. The ventricles and sulci are normal for age. No acute large vascular territory infarcts. No abnormal extra-axial fluid collections. Basal cisterns are patent. VASCULAR: Mild calcific atherosclerosis of the carotid siphons. SKULL: No skull fracture. No significant scalp soft tissue swelling. SINUSES/ORBITS: The mastoid air-cells and included paranasal sinuses are well-aerated.Status post LEFT ocular lens implant. Bilateral scleral calcifications. OTHER: Life-support lines in place. CT CERVICAL SPINE FINDINGS ALIGNMENT: Maintained lordosis. Vertebral bodies in alignment. Mild S-type scoliosis. SKULL BASE AND VERTEBRAE: Cervical vertebral bodies and posterior elements are intact. 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 Severe C5-6 disc height loss, vacuum disc and endplate spurring. SOFT TISSUES AND SPINAL CANAL: Nonacute. Moderate calcific atherosclerosis RIGHT greater than LEFT carotid bulbs. DISC LEVELS: Mild canal stenosis C4-5 and C5-6. Mild LEFT C4-5, severe LEFT and mild RIGHT C5-6, mild LEFT C7-T1 neural foraminal narrowing. UPPER CHEST: Lung apices are clear. OTHER: None. IMPRESSION: CT HEAD: Negative noncontrast CT HEAD for age. CT CERVICAL SPINE: 1. No acute fracture or malalignment. 2. Multilevel segmentation anomaly. Electronically Signed   By: Elon Alas M.D.   On: 06/28/2017 18:35   Ct Cervical Spine Wo Contrast  Result Date: 06/28/2017 CLINICAL DATA:  Cardiac arrest August 20th, ACLS time 20 minutes. EXAM: CT HEAD WITHOUT CONTRAST CT CERVICAL SPINE WITHOUT CONTRAST TECHNIQUE: Multidetector CT imaging of the head and cervical spine was performed following the standard protocol without intravenous contrast. Multiplanar CT image reconstructions of the cervical spine were also generated. COMPARISON:  None. FINDINGS: CT HEAD  FINDINGS BRAIN: No intraparenchymal hemorrhage, mass effect nor midline shift. The ventricles and sulci are normal for age. No acute large vascular territory infarcts. No abnormal extra-axial fluid collections. Basal cisterns are patent. VASCULAR: Mild calcific atherosclerosis of the carotid siphons. SKULL: No skull fracture. No significant scalp soft tissue swelling. SINUSES/ORBITS: The mastoid air-cells and included paranasal sinuses are well-aerated.Status post LEFT ocular lens implant. Bilateral scleral calcifications. OTHER: Life-support lines in place. CT CERVICAL SPINE FINDINGS ALIGNMENT: Maintained lordosis. Vertebral bodies in alignment. Mild S-type scoliosis. SKULL BASE AND VERTEBRAE: Cervical vertebral bodies and posterior elements are intact. 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 Severe C5-6 disc height loss, vacuum disc and endplate spurring. SOFT TISSUES AND SPINAL CANAL: Nonacute. Moderate calcific atherosclerosis RIGHT greater than LEFT carotid bulbs. DISC LEVELS: Mild canal stenosis C4-5 and C5-6. Mild LEFT C4-5, severe LEFT and mild RIGHT C5-6, mild LEFT C7-T1 neural foraminal narrowing. UPPER CHEST: Lung apices are clear. OTHER:  None. IMPRESSION: CT HEAD: Negative noncontrast CT HEAD for age. CT CERVICAL SPINE: 1. No acute fracture or malalignment. 2. Multilevel segmentation anomaly. Electronically Signed   By: Elon Alas M.D.   On: 06/28/2017 18:35   Dg Chest Port 1 View  Result Date: 06/29/2017 CLINICAL DATA:  Respiratory failure, intubated patient, status post 7 cardiac arrest. EXAM: PORTABLE CHEST 1 VIEW COMPARISON:  Chest x-ray of June 27, 2017 FINDINGS: The lungs are reasonably well inflated. There are coarse alveolar infiltrates in the mid and lower lungs bilaterally more conspicuous today. The retrocardiac region on the left is dense. There is no pneumothorax or significant pleural effusion. The esophagogastric tube tip in proximal port project below the inferior margin of the  image. A left internal jugular venous catheter tip projects over the distal third of the SVC. The endotracheal tube tip lies approximately 3.9 cm above the carina. IMPRESSION: Mild interval worsening in the appearance of the mid and lower lungs bilaterally. This may reflect alveolar edema but aspiration pneumonia is felt likely. Electronically Signed   By: David  [place]ique M.D.   On: 06/29/2017 07:27    STUDIES:  LHC 8/20: There is severe left ventricular systolic dysfunction, LV end diastolic pressure is mildly elevated.The left ventricular ejection fraction is less than 25% by visual estimate. Configuration consistent with Takatsubo Syndrome PORT CXR 8/21: Endotracheal tube approximately 8 cm from carina. Enteric feeding tube coursing below diaphragm. Left internal jugular central venous catheter in good position. No focal opacity or effusion appreciated. EEG 8/21 > Diffuse background suppression and slowing, occasional focal slowing over the right temporal region  CT Head 8/22 > No acute  CT C Spine 8/22 > No acute, multilevel segmentation anomaly  TTE 8/21 > EF 35-40%, G1DD, Moderately reduced systolic function   MICROBIOLOGY: MRSA PCR 8/21:  Negative Blood Cultures x2 8/21 >>> Urine Culture 8/21 > negative  Tracheal Aspirate Culture 8/21 >>> Blood 8/23 >> Sputum 8/23 >> U/A 8/23 > Turbid, rare bacteria, negative leukocytes   ANTIBIOTICS: Zosyn 8/23 >>   SIGNIFICANT EVENTS: 8/20 - Admit & started therapeutic hypothermia post arrest 8/22 rewarm  LINES/TUBES: OETT 7.0 8/20 > 8/24 L [place] CVL 8/21 >>> L RAD ART LINE 8/21 > 8/23  OGT > 8/24  FOLEY >>> PIV  ASSESSMENT / PLAN:  PULMONARY A: Acute hypoxic respiratory failure: In the setting of cardiac arrest P:   Wean FiO2 for saturation > 94 Trend CXR Pulmonary Hygiene   CARDIOVASCULAR A:  Inferior STEMI > Clear Vessels on Cath  Prolonged QTC > Resolved  -Ammio gtt stopped 8/22 Non-Ischemic Cardiomyopathy  Takatsubo  Cardiomyopathy ? H/O HTN P:  Per Cardiology > May need EP consult for ICD  Cardiac Monitoring  EKG Daily  PRN Metoprolol for Systolic > 938 Hold preadmission amlodipine, hydrodiuril, toprol-xl.  RENAL A:   Hypokalemia Hypophosphorous  Hypomagnesemia  Acute renal failure: Resolved. P:   Strict I&0 Trend BMP > Repeat 1800  Replace Electrolytes as needed  Lasix 40 BID (started 8/22) > Negative 1.6L 8/24   GASTROINTESTINAL A:   No acute issues. P:   NPO PPI  Speech Therapy Evaluation   HEMATOLOGIC A:   Leukocytosis > Improving  P:  Trend CBC Daily   INFECTIOUS A:   Leukocytosis > Improving  Aspiration Event? -PCT 0.41 > 0.50 > 2.0 > 1.92  -Urine cloudy with sediment, U/A negative  P:   Trend WBC and Fever Curve Resend Cultures > Follow data   Continue Zosyn (Day 2)  ENDOCRINE A:   Hyperglycemia: Controlled.  P:   Trend Glucose  SSI Lantus 10 u Daily   NEUROLOGIC A:   Acute encephalopathy: Question possible anoxic brain injury post arrest. Sedation on ventilator & paralytic -EEG 8/21 > Diffuse background suppression and slowing, occasional focal slowing over the right temporal region  -CT Head Negative on 8/22  -Reached goal temp for rewarming at 1400 8/22  P:   Daily WUA  Fentanyl PRN for acute pain    FAMILY  - Updates: Wife updated at bedside   - Inter-disciplinary family meet or Palliative Care meeting due by:  07/03/2017   CC Time: 63  Hayden Pedro, AGACNP-BC [place]  Pgr: [phone]  PCCM Pgr: [phone]  STAFF NOTE: Linwood Dibbles, MD FACP have personally reviewed patient's available data, including medical history, events of note, physical examination and test results as part of my evaluation. I have discussed with resident/NP and other care providers such as pharmacist, RN and RRT. In addition, I personally evaluated patient and elicited key findings of: no distress, agitation noted, rr 30 at  times, ronchi, abdo soft, follows commands, limited edema, pcxr which I reviewed shows worsening ATX left, int prominence, has some rise in crt after lasix and neg 1 liter, he slef extubated, does not need re intubation at this stage, I am concerned about atx worsening, would hold lasix and follow crt closely, NPO, will need slp, HTN control required, would have caution with BB with EF - consider add hydralazine use, need to continued pads to keep normothermia to remain, if shiver can restart precedex low dose, k supp need, I updated wife, if spike then wil  Change unasyn to zosyn done, follow sputum, pt eval soon, aggressive IS The patient is critically ill with multiple organ systems failure and requires high complexity decision making for assessment and support, frequent evaluation and titration of therapies, application of advanced monitoring technologies and extensive interpretation of multiple databases.   Critical Care Time devoted to patient care services described in this note is 30 Minutes. This time reflects time of care of this signee: Merrie Roof, MD FACP. This critical care time does not reflect procedure time, or teaching time or supervisory time of PA/NP/Med student/Med Resident etc but could involve care discussion time. Rest per NP/medical resident whose note is outlined above and that I agree with   Lavon Paganini. Titus Mould, MD, [place] Pgr: [place] [facility] 06/30/2017 8:31 AM

## 2017-06-29 NOTE — Progress Notes (Signed)
Pharmacy Antibiotic Note  Jeffery Thomas is a 66 y.o. male admitted on 06/26/2017 with cardiac arrest.  Pharmacy has been consulted for Zosyn dosing with concern for possible aspiration and mild temp elevation.  Plan: -Zosyn 3.375g IV EI q8h -Monitor renal funx, LOT, cultures  Height: 6\' 2"  (188 cm) Weight: 216 lb 4.3 oz (98.1 kg) IBW/kg (Calculated) : 82.2  Temp (24hrs), Avg:98.6 F (37 C), Min:95.4 F (35.2 C), Max:100.6 F (38.1 C)   Recent Labs Lab 06/26/17 2137 06/26/17 2154  06/27/17 0533  06/28/17 0034 06/28/17 0036 06/28/17 0444 06/28/17 0646 06/28/17 1214 06/29/17 0301  WBC 12.4*  --   --  22.7*  --   --   --   --  12.7*  --  11.4*  CREATININE 1.60* 1.40*  < > 0.83  < > 0.40* 0.46* 0.40*  --  0.67 0.74  LATICACIDVEN  --  11.42*  --   --   --   --   --   --   --   --   --   < > = values in this interval not displayed.  Estimated Creatinine Clearance: 108.5 mL/min (by C-G formula based on SCr of 0.74 mg/dL).    No Known Allergies  Antimicrobials this admission: 8/23 Zosyn >>   Dose adjustments this admission: none  Microbiology results: 8/21 BCx: NGTD 8/21 Sputum: px  8/21 MRSA PCR: neg  Thank you for allowing pharmacy to be a part of this patient's care.  Arrie Senate, PharmD PGY-2 Cardiology Pharmacy Resident Pager: [phone] 06/29/2017

## 2017-06-29 NOTE — Progress Notes (Signed)
Notified Dr. Emmit Alexanders of pt vomit X1 a large amount of undigested food, and  orders to start tube feeds. Discussed with MD to hold off on starting tube feeds until AM. Will hold tube feeds overnight and relay message to AM RN. Will continue to monitor pt closely.

## 2017-06-29 NOTE — Progress Notes (Signed)
Progress Note  Patient Name: Jeffery Thomas Date of Encounter: 06/29/2017  Primary Cardiologist: New/Berry  Subjective   Intubated, some sedation still on board. Family at the bedside.   Inpatient Medications    Scheduled Meds: . chlorhexidine gluconate (MEDLINE KIT)  15 mL Mouth Rinse BID  . Chlorhexidine Gluconate Cloth  6 each Topical Q0600  . furosemide  40 mg Intravenous BID  . heparin subcutaneous  5,000 Units Subcutaneous Q8H  . insulin aspart  2-6 Units Subcutaneous Q4H  . insulin glargine  10 Units Subcutaneous Q24H  . mouth rinse  15 mL Mouth Rinse 10 times per day  . methylPREDNISolone (SOLU-MEDROL) injection  20 mg Intravenous Q24H  . pantoprazole (PROTONIX) IV  40 mg Intravenous QHS  . sodium chloride flush  10-40 mL Intracatheter Q12H   Continuous Infusions: . sodium chloride    . fentaNYL infusion INTRAVENOUS 25 mcg/hr (06/29/17 0754)  . piperacillin-tazobactam (ZOSYN)  IV 3.375 g (06/29/17 0828)  . propofol (DIPRIVAN) infusion 40 mcg/kg/min (06/29/17 0844)   PRN Meds: sodium chloride, acetaminophen, fentaNYL, fentaNYL (SUBLIMAZE) injection, meperidine (DEMEROL) injection, midazolam, ondansetron (ZOFRAN) IV, sodium chloride flush   Vital Signs    Vitals:   06/29/17 0600 06/29/17 0700 06/29/17 0758 06/29/17 0800  BP: 119/82 98/63 (!) 175/83 (!) 134/95  Pulse: (!) 104 82 (!) 108 (!) 108  Resp: 20 16 (!) 24 (!) 24  Temp: 99.1 F (37.3 C) 99.7 F (37.6 C)  98.8 F (37.1 C)  TempSrc:  Core (Comment)  Core (Comment)  SpO2: 91% 96% 93% 92%  Weight:      Height:        Intake/Output Summary (Last 24 hours) at 06/29/17 0935 Last data filed at 06/29/17 0800  Gross per 24 hour  Intake              693 ml  Output              945 ml  Net             -252 ml   Filed Weights   06/27/17 0130 06/28/17 0352 06/29/17 0313  Weight: 208 lb 0.8 oz (94.4 kg) 208 lb 8.9 oz (94.6 kg) 216 lb 4.3 oz (98.1 kg)    Telemetry    SR, lots of artifact - Personally  Reviewed  ECG    SR, QT Interval improved 472 - Personally Reviewed  Physical Exam   General: Intubated, sedated, opening eyes, and some physical movements.  Head: Normocephalic, atraumatic.  Neck: Supple no JVD. Lungs:  Resp regular and unlabored, CTA, course breath sounds. Heart: RRR, S1, S2, no S3, S4, or murmur; no rub. Abdomen: Soft, non-tender, non-distended Extremities: No clubbing, cyanosis, edema. Distal pedal pulses are 2+ bilaterally. Neuro: sedated, but some physical movements Psych: sedated  Labs    Chemistry Recent Labs Lab 06/26/17 2137  06/27/17 0146  06/28/17 0036 06/28/17 0444 06/28/17 1214 06/29/17 0301  NA 137  < > 137  < > 138 141 138 137  K 3.4*  < > 3.3*  < > 3.9 3.1* 3.4* 4.0  CL 98*  < > 99*  < > 107 103 107 106  CO2 17*  --  24  < > 21*  --  24 24  GLUCOSE 254*  < > 227*  < > 125* 143* 104* 123*  BUN 22*  < > 21*  < > _0 CREATININE 1.60*  < > 0.96  < > 0.46*  0.40* 0.67 0.74  CALCIUM 8.0*  --  8.2*  < > 8.1*  --  7.9* 7.9*  PROT 5.9*  --  7.0  --   --   --   --   --   ALBUMIN 3.6  --  4.2  --   --   --   --   --   AST 218*  --  202*  --   --   --   --   --   ALT 134*  --  173*  --   --   --   --   --   ALKPHOS 75  --  76  --   --   --   --   --   BILITOT 1.0  --  1.1  --   --   --   --   --   GFRNONAA 44*  --  >60  < > >60  --  >60 >60  GFRAA 51*  --  >60  < > >60  --  >60 >60  ANIONGAP 22*  --  14  < > 10  --  7 7  < > = values in this interval not displayed.   Hematology Recent Labs Lab 06/27/17 0533  06/28/17 0444 06/28/17 0646 06/29/17 0301  WBC 22.7*  --   --  12.7* 11.4*  RBC 5.56  --   --  5.27 5.02  HGB 16.2  < > 15.0 15.1 14.6  HCT 47.6  < > 44.0 44.3 44.2  MCV 85.6  --   --  84.1 88.0  MCH 29.1  --   --  28.7 29.1  MCHC 34.0  --   --  34.1 33.0  RDW 13.0  --   --  13.1 14.1  PLT 244  --   --  212 181  < > = values in this interval not displayed.  Cardiac Enzymes Recent Labs Lab 06/27/17 0146 06/27/17 0533  06/27/17 0843 06/27/17 1542  TROPONINI 2.03* 3.27* 3.19* 2.78*    Recent Labs Lab 06/26/17 2152  TROPIPOC 0.16*     BNPNo results for input(s): BNP, PROBNP in the last 168 hours.   DDimer No results for input(s): DDIMER in the last 168 hours.    Radiology    Dg Abd 1 View  Result Date: 06/29/2017 CLINICAL DATA:  Abdominal distention EXAM: ABDOMEN - 1 VIEW COMPARISON:  06/27/2017 FINDINGS: NG tube tip is in the mid to distal stomach. Nonobstructive bowel gas pattern. No organomegaly or evidence of free air. IMPRESSION: NG tube tip in the mid to distal stomach. Nonobstructive bowel gas pattern. Electronically Signed   By: Rolm Baptise M.D.   On: 06/29/2017 08:58   Ct Head Wo Contrast  Result Date: 06/28/2017 CLINICAL DATA:  Cardiac arrest August 20th, ACLS time 20 minutes. EXAM: CT HEAD WITHOUT CONTRAST CT CERVICAL SPINE WITHOUT CONTRAST TECHNIQUE: Multidetector CT imaging of the head and cervical spine was performed following the standard protocol without intravenous contrast. Multiplanar CT image reconstructions of the cervical spine were also generated. COMPARISON:  None. FINDINGS: CT HEAD FINDINGS BRAIN: No intraparenchymal hemorrhage, mass effect nor midline shift. The ventricles and sulci are normal for age. No acute large vascular territory infarcts. No abnormal extra-axial fluid collections. Basal cisterns are patent. VASCULAR: Mild calcific atherosclerosis of the carotid siphons. SKULL: No skull fracture. No significant scalp soft tissue swelling. SINUSES/ORBITS: The mastoid air-cells and included paranasal sinuses are well-aerated.Status post LEFT ocular lens implant.  Bilateral scleral calcifications. OTHER: Life-support lines in place. CT CERVICAL SPINE FINDINGS ALIGNMENT: Maintained lordosis. Vertebral bodies in alignment. Mild S-type scoliosis. SKULL BASE AND VERTEBRAE: Cervical vertebral bodies and posterior elements are intact. 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 Severe C5-6  disc height loss, vacuum disc and endplate spurring. SOFT TISSUES AND SPINAL CANAL: Nonacute. Moderate calcific atherosclerosis RIGHT greater than LEFT carotid bulbs. DISC LEVELS: Mild canal stenosis C4-5 and C5-6. Mild LEFT C4-5, severe LEFT and mild RIGHT C5-6, mild LEFT C7-T1 neural foraminal narrowing. UPPER CHEST: Lung apices are clear. OTHER: None. IMPRESSION: CT HEAD: Negative noncontrast CT HEAD for age. CT CERVICAL SPINE: 1. No acute fracture or malalignment. 2. Multilevel segmentation anomaly. Electronically Signed   By: Elon Alas M.D.   On: 06/28/2017 18:35   Ct Cervical Spine Wo Contrast  Result Date: 06/28/2017 CLINICAL DATA:  Cardiac arrest August 20th, ACLS time 20 minutes. EXAM: CT HEAD WITHOUT CONTRAST CT CERVICAL SPINE WITHOUT CONTRAST TECHNIQUE: Multidetector CT imaging of the head and cervical spine was performed following the standard protocol without intravenous contrast. Multiplanar CT image reconstructions of the cervical spine were also generated. COMPARISON:  None. FINDINGS: CT HEAD FINDINGS BRAIN: No intraparenchymal hemorrhage, mass effect nor midline shift. The ventricles and sulci are normal for age. No acute large vascular territory infarcts. No abnormal extra-axial fluid collections. Basal cisterns are patent. VASCULAR: Mild calcific atherosclerosis of the carotid siphons. SKULL: No skull fracture. No significant scalp soft tissue swelling. SINUSES/ORBITS: The mastoid air-cells and included paranasal sinuses are well-aerated.Status post LEFT ocular lens implant. Bilateral scleral calcifications. OTHER: Life-support lines in place. CT CERVICAL SPINE FINDINGS ALIGNMENT: Maintained lordosis. Vertebral bodies in alignment. Mild S-type scoliosis. SKULL BASE AND VERTEBRAE: Cervical vertebral bodies and posterior elements are intact. 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 Severe C5-6 disc height loss, vacuum disc and endplate spurring. SOFT TISSUES AND SPINAL CANAL: Nonacute.  Moderate calcific atherosclerosis RIGHT greater than LEFT carotid bulbs. DISC LEVELS: Mild canal stenosis C4-5 and C5-6. Mild LEFT C4-5, severe LEFT and mild RIGHT C5-6, mild LEFT C7-T1 neural foraminal narrowing. UPPER CHEST: Lung apices are clear. OTHER: None. IMPRESSION: CT HEAD: Negative noncontrast CT HEAD for age. CT CERVICAL SPINE: 1. No acute fracture or malalignment. 2. Multilevel segmentation anomaly. Electronically Signed   By: Elon Alas M.D.   On: 06/28/2017 18:35   Dg Chest Port 1 View  Result Date: 06/29/2017 CLINICAL DATA:  Respiratory failure, intubated patient, status post 7 cardiac arrest. EXAM: PORTABLE CHEST 1 VIEW COMPARISON:  Chest x-ray of June 27, 2017 FINDINGS: The lungs are reasonably well inflated. There are coarse alveolar infiltrates in the mid and lower lungs bilaterally more conspicuous today. The retrocardiac region on the left is dense. There is no pneumothorax or significant pleural effusion. The esophagogastric tube tip in proximal port project below the inferior margin of the image. A left internal jugular venous catheter tip projects over the distal third of the SVC. The endotracheal tube tip lies approximately 3.9 cm above the carina. IMPRESSION: Mild interval worsening in the appearance of the mid and lower lungs bilaterally. This may reflect alveolar edema but aspiration pneumonia is felt likely. Electronically Signed   By: David  Martinique M.D.   On: 06/29/2017 07:27    Cardiac Studies   Cardiac Cath: Coronary Diagrams   Diagnostic Diagram        TTE: interpretation pending  Patient Profile     66 y.o. male  with history of HTN, HLD, GERD admitted following out of hospital cardiac  arrest. He reported chest pain to his wife then collapsed in the other room. AED applied by EMS and pt was shocked. He then had PEA and then v. Fib with ROSC with 20 minutes of CPR. Emergent cardiac cath with normal coronary arteries. LVEF severely reduced at 25% on  cath.   Assessment & Plan    Vfib arrest: Underwent cardiac cath with Dr. Gwenlyn Found with no CAD, but severely reduced EF on LV gram. Placed on IV amio post cath, but has increasing QT interval yesterday, therefore was stopped. Improved today, no ectopy noted on telemetry. Turned off sedated yesterday and became agitated, spike a temp. Hd physical movement but not purposeful, no following commands. Lots of shivering, and sedation back on. Plan for EP evaluation pending neurological status. Weight is trending up, lasix started by primary.  -- echo completed, but read is pending  Signed, Reino Bellis, NP  06/29/2017, 9:35 AM  Pager # [phone]   Patient seen, examined. Available data reviewed. Agree with findings, assessment, and plan as outlined by Reino Bellis, NP-C. on exam the patient is arousable. He opens his eyes to verbal stimuli and blinks. He has not made any purposeful movements. Heart is regular rate and rhythm with no murmur or gallop. Lung fields are clear. Abdomen is soft and nontender. Extremities have mild edema. Skin is warm and dry without rash.  Telemetry reveals no ventricular arrhythmia. His QT-C has improved with discontinuation of amiodarone and rewarming. He is moving and exhibiting some signs of agitation by reports when his sedatives are discontinued. Hopefully this is a sign of neurologic recovery but the family understands it is still too early to prognosticate much. I have personally reviewed his echo images this morning and I think his LV function is significantly improved from baseline when he underwent cardiac catheterization. Will await formal report but by my assessment his LVEF is at least 40%. I don't appreciate any major valvular abnormalities. The family is updated at length at the bedside. We will continue to follow him closely. As he improves neurologically we will get the EP team involved. He might be a candidate for a LifeVest as a bridge to ICD therapy depending  on his neurologic status.  Sherren Mocha, M.D. 06/29/2017 10:30 AM

## 2017-06-29 NOTE — Progress Notes (Signed)
eLink Physician-Brief Progress Note Patient Name: Jeffery Thomas DOB: Jul 17, 1951 MRN: [id number]   Date of Service  06/29/2017  HPI/Events of Note  Shivering - post hypothermia protocol.  eICU Interventions  Will order: 1. Meperidine 25 mg IV Q 2 hours PRN shivering.  2. Change Tylenol route from per tube to rectal.     Intervention Category Major Interventions: Other:  Jenessa Gillingham Cornelia Copa 06/29/2017, 5:51 AM

## 2017-06-30 ENCOUNTER — Inpatient Hospital Stay (HOSPITAL_COMMUNITY)

## 2017-06-30 ENCOUNTER — Encounter (HOSPITAL_COMMUNITY): Payer: Self-pay | Admitting: Certified Registered Nurse Anesthetist

## 2017-06-30 DIAGNOSIS — I4901 Ventricular fibrillation: Secondary | ICD-10-CM

## 2017-06-30 DIAGNOSIS — R140 Abdominal distension (gaseous): Secondary | ICD-10-CM

## 2017-06-30 LAB — BLOOD GAS, ARTERIAL
ACID-BASE EXCESS: 3.8 mmol/L — AB (ref 0.0–2.0)
Bicarbonate: 28.2 mmol/L — ABNORMAL HIGH (ref 20.0–28.0)
Drawn by: 51155
O2 CONTENT: 5 L/min
O2 SAT: 97.2 %
PATIENT TEMPERATURE: 98.6
PCO2 ART: 46.2 mmHg (ref 32.0–48.0)
PO2 ART: 101 mmHg (ref 83.0–108.0)
pH, Arterial: 7.403 (ref 7.350–7.450)

## 2017-06-30 LAB — CBC
HCT: 39.6 % (ref 39.0–52.0)
HEMOGLOBIN: 13.3 g/dL (ref 13.0–17.0)
MCH: 29.2 pg (ref 26.0–34.0)
MCHC: 33.6 g/dL (ref 30.0–36.0)
MCV: 86.8 fL (ref 78.0–100.0)
PLATELETS: 178 10*3/uL (ref 150–400)
RBC: 4.56 MIL/uL (ref 4.22–5.81)
RDW: 13.5 % (ref 11.5–15.5)
WBC: 13 10*3/uL — ABNORMAL HIGH (ref 4.0–10.5)

## 2017-06-30 LAB — GLUCOSE, CAPILLARY
GLUCOSE-CAPILLARY: 127 mg/dL — AB (ref 65–99)
GLUCOSE-CAPILLARY: 108 mg/dL — AB (ref 65–99)
GLUCOSE-CAPILLARY: 146 mg/dL — AB (ref 65–99)
Glucose-Capillary: 116 mg/dL — ABNORMAL HIGH (ref 65–99)
Glucose-Capillary: 124 mg/dL — ABNORMAL HIGH (ref 65–99)

## 2017-06-30 LAB — PHOSPHORUS: PHOSPHORUS: 3.5 mg/dL (ref 2.5–4.6)

## 2017-06-30 LAB — BASIC METABOLIC PANEL
ANION GAP: 14 (ref 5–15)
Anion gap: 13 (ref 5–15)
BUN: 20 mg/dL (ref 6–20)
BUN: 24 mg/dL — ABNORMAL HIGH (ref 6–20)
CALCIUM: 8.5 mg/dL — AB (ref 8.9–10.3)
CALCIUM: 8.9 mg/dL (ref 8.9–10.3)
CO2: 27 mmol/L (ref 22–32)
CO2: 27 mmol/L (ref 22–32)
CREATININE: 1.09 mg/dL (ref 0.61–1.24)
CREATININE: 1.05 mg/dL (ref 0.61–1.24)
Chloride: 100 mmol/L — ABNORMAL LOW (ref 101–111)
Chloride: 102 mmol/L (ref 101–111)
GFR calc non Af Amer: >60 mL/min (ref >60)
Glucose, Bld: 149 mg/dL — ABNORMAL HIGH (ref 65–99)
Glucose, Bld: 158 mg/dL — ABNORMAL HIGH (ref 65–99)
Potassium: 3.4 mmol/L — ABNORMAL LOW (ref 3.5–5.1)
Potassium: 3.7 mmol/L (ref 3.5–5.1)
SODIUM: 141 mmol/L (ref 135–145)
SODIUM: 142 mmol/L (ref 135–145)

## 2017-06-30 LAB — PROCALCITONIN: Procalcitonin: 1.54 ng/mL

## 2017-06-30 LAB — MAGNESIUM: MAGNESIUM: 2.1 mg/dL (ref 1.7–2.4)

## 2017-06-30 MED ORDER — SODIUM CHLORIDE 0.9 % IV SOLN
0.4000 ug/kg/h | INTRAVENOUS | Status: DC
  Administered 2017-06-30: 0.5 ug/kg/h via INTRAVENOUS
  Administered 2017-06-30 (×2): 1.2 ug/kg/h via INTRAVENOUS
  Filled 2017-06-30 (×3): qty 2

## 2017-06-30 MED ORDER — MIDAZOLAM HCL 2 MG/2ML IJ SOLN
0.5000 mg | INTRAMUSCULAR | Status: DC | PRN
  Administered 2017-06-30: 0.5 mg via INTRAVENOUS

## 2017-06-30 MED ORDER — HYDRALAZINE HCL 20 MG/ML IJ SOLN
10.0000 mg | INTRAMUSCULAR | Status: DC | PRN
  Administered 2017-07-01 (×2): 10 mg via INTRAVENOUS
  Filled 2017-06-30 (×2): qty 1

## 2017-06-30 MED ORDER — DEXMEDETOMIDINE HCL IN NACL 400 MCG/100ML IV SOLN
0.4000 ug/kg/h | INTRAVENOUS | Status: DC
  Administered 2017-06-30 – 2017-07-01 (×2): 1.2 ug/kg/h via INTRAVENOUS
  Administered 2017-07-01: 0.8 ug/kg/h via INTRAVENOUS
  Administered 2017-07-01: 1.2 ug/kg/h via INTRAVENOUS
  Filled 2017-06-30 (×5): qty 100

## 2017-06-30 MED ORDER — CHLORHEXIDINE GLUCONATE 0.12 % MT SOLN
15.0000 mL | Freq: Two times a day (BID) | OROMUCOSAL | Status: DC
  Administered 2017-06-30: 15 mL via OROMUCOSAL

## 2017-06-30 MED ORDER — HALOPERIDOL LACTATE 5 MG/ML IJ SOLN
5.0000 mg | Freq: Once | INTRAMUSCULAR | Status: AC
  Administered 2017-06-30: 5 mg via INTRAVENOUS

## 2017-06-30 MED ORDER — ORAL CARE MOUTH RINSE: 15.0000 mL | Freq: Two times a day (BID) | OROMUCOSAL | Status: DC

## 2017-06-30 MED ORDER — METOPROLOL TARTRATE 5 MG/5ML IV SOLN
5.0000 mg | Freq: Four times a day (QID) | INTRAVENOUS | Status: DC
  Administered 2017-06-30 (×2): 5 mg via INTRAVENOUS
  Filled 2017-06-30 (×2): qty 5

## 2017-06-30 MED ORDER — HALOPERIDOL LACTATE 5 MG/ML IJ SOLN
10.0000 mg | Freq: Once | INTRAMUSCULAR | Status: AC
  Administered 2017-06-30: 10 mg via INTRAVENOUS
  Filled 2017-06-30: qty 2

## 2017-06-30 MED ORDER — POTASSIUM CHLORIDE 10 MEQ/50ML IV SOLN
10.0000 meq | INTRAVENOUS | Status: AC
  Administered 2017-06-30 (×4): 10 meq via INTRAVENOUS
  Filled 2017-06-30 (×4): qty 50

## 2017-06-30 MED ORDER — DEXMEDETOMIDINE HCL IN NACL 400 MCG/100ML IV SOLN
0.4000 ug/kg/h | INTRAVENOUS | Status: DC
  Filled 2017-06-30: qty 100

## 2017-06-30 MED ORDER — HALOPERIDOL LACTATE 5 MG/ML IJ SOLN
INTRAMUSCULAR | Status: AC
  Administered 2017-06-30: 5 mg via INTRAVENOUS
  Filled 2017-06-30: qty 1

## 2017-06-30 MED ORDER — MIDAZOLAM HCL 2 MG/2ML IJ SOLN
INTRAMUSCULAR | Status: AC
  Filled 2017-06-30: qty 2

## 2017-06-30 MED ORDER — METOPROLOL TARTRATE 5 MG/5ML IV SOLN
5.0000 mg | Freq: Four times a day (QID) | INTRAVENOUS | Status: DC | PRN
  Administered 2017-06-30: 5 mg via INTRAVENOUS
  Filled 2017-06-30: qty 5

## 2017-06-30 NOTE — Progress Notes (Signed)
eLink Physician-Brief Progress Note Patient Name: [name] DOB: 06/16/1951 MRN: [id number]   Date of Service  06/30/2017  HPI/Events of Note  Notified by bedside nurse and ongoing delirium. Self extubated previously. QTC 463 ms this morning.   eICU Interventions  Ordering Precedex drip for treatment of delirium      Intervention Category Major Interventions: Delirium, psychosis, severe agitation - evaluation and management  Tera Partridge 06/30/2017, 5:47 PM

## 2017-06-30 NOTE — Evaluation (Signed)
Speech Language Pathology Evaluation Patient Details Name: Jeffery Thomas MRN: [id number] DOB: 04-25-1951 Today's Date: 06/30/2017 Time: 5681-2751 SLP Time Calculation (min) (ACUTE ONLY): 9 min  Problem List:  Patient Active Problem List   Diagnosis Date Noted  . Encounter for imaging study to confirm orogastric (OG) tube placement   . Acute respiratory failure with hypoxia ([place])   . Encounter for central line placement   . Ventricular fibrillation ([place]) 07/21/17  . Sudden cardiac death ([place]) 07/21/17  . ST elevation myocardial infarction (STEMI) [facility])    Past Medical History:  Past Medical History:  Diagnosis Date  . Hypertension    Past Surgical History:  Past Surgical History:  Procedure Laterality Date  . LEFT HEART CATH AND CORONARY ANGIOGRAPHY N/A 2017/07/21   Procedure: LEFT HEART CATH AND CORONARY ANGIOGRAPHY;  Surgeon: Lorretta Harp, MD;  Location: [facility];  Service: Cardiovascular;  Laterality: N/A;   HPI:  66 year old male presents to ED 2023/07/22 after cardiac arrest (ACLS about 20 minutes) found to have inferior STEMI. Emergent Cath performed with normal coronary arteries and severely reduced EF of 25%. ETT 22-Jul-2023, self-extubated 8/24. Pt with acute encephalopathy; concerns for anoxia.     Assessment / Plan / Recommendation Clinical Impression  Pt presents with generalized cognitve deficits marked by poor focused attention, impaired working memory, poor judgment.  Basic comprehension, verbal expression are intact; output is fluent, no dysarthria; naming intact.  Pt with perseveratory responses, high impulsivity, restlessness.  SLP will follow for cognitive intervention and to determine needs post- D/C.  D/W wife, who agrees.     SLP Assessment  SLP Visit Diagnosis: Cognitive communication deficit (R41.841)    Follow Up Recommendations   (tba)    Frequency and Duration min 2x/week  1 week      SLP Evaluation Cognition  Overall Cognitive Status:  Impaired/Different from baseline Arousal/Alertness: Awake/alert Orientation Level: Oriented to person;Disoriented to time;Disoriented to situation Attention: Focused Focused Attention: Impaired Focused Attention Impairment: Verbal basic;Functional basic Memory: Impaired Memory Impairment: Storage deficit Awareness: Impaired Awareness Impairment: Intellectual impairment Problem Solving: Impaired Problem Solving Impairment: Verbal basic Behaviors: Restless;Impulsive;Perseveration;Poor frustration tolerance Safety/Judgment: Impaired       Comprehension  Auditory Comprehension Overall Auditory Comprehension: Appears within functional limits for tasks assessed Yes/No Questions: Within Functional Limits Commands: Within Functional Limits Reading Comprehension Reading Status: Not tested    Expression Expression Primary Mode of Expression: Verbal Verbal Expression Overall Verbal Expression: Appears within functional limits for tasks assessed   Oral / Motor  Oral Motor/Sensory Function Overall Oral Motor/Sensory Function: Within functional limits Motor Speech Overall Motor Speech: Appears within functional limits for tasks assessed   GO                    Juan Quam Laurice 06/30/2017, 3:02 PM

## 2017-06-30 NOTE — Progress Notes (Signed)
eLink Physician-Brief Progress Note Patient Name: Jeffery Thomas DOB: July 02, 1951 MRN: [id number]   Date of Service  06/30/2017  HPI/Events of Note  Patient self extubated. Looks comfortable on 100% NRBM - Sat = 97% and RR = 19. All sedative IV infusions are now off.   eICU Interventions  Will order: 1. Wean O2 as tolerated to [place] O2. Keep sats >= 92%.  2. ABG at 5 AM. 3. Incentive spirometry Q 1 hour while awake.      Intervention Category Major Interventions: Respiratory failure - evaluation and management  Americo Vallery Eugene 06/30/2017, 4:25 AM

## 2017-06-30 NOTE — Progress Notes (Signed)
Foley catheter removed at this time due to patient complaining of sharp, shooting pain through his penis. RN administered fentanyl and will continue to monitor.

## 2017-06-30 NOTE — Progress Notes (Signed)
Instructed patient on flutter valve. Pt demonstrated use several times and has a strong cough afterwards. Family at bedside encouraging patient with flutter and IS. Will cont to monitor

## 2017-06-30 NOTE — Progress Notes (Signed)
RN in the room doing mouthcare for patient who was alert and following commands. He was not agitated all night, and RN was able to wean off some sedation.   As RN turned around and went to scan other medications, patient reached for the tube. As soon as RN noticed patient pulling on the tube, RN called for help.  Five other RNs came in the room to help, along with one NT and RT.   Patient was placed on venti mask and successfully weaning to nasal canula per MD orders.   No harm was done to the patient, and family at bedside was reassured about what was going on.   Patient currently safe and in bed. Family still at bedside and all calm.   Incoming RN notified of situation.   Jeffery Thomas E Reola Mosher, [place]

## 2017-06-30 NOTE — Progress Notes (Signed)
PULMONARY / CRITICAL CARE MEDICINE   Name: Jeffery Thomas MRN: [id number] DOB: [birth year]    ADMISSION DATE:  06/26/2017 CONSULTATION DATE:  06/26/17  REFERRING MD:  Aundra Dubin  CHIEF COMPLAINT:  Cardiac Arrest  Brief:   66 year old male presents to ED 8/20 after cardiac arrest (ACLS about 20 minutes) found to have inferior STEMI. Emergent Cath performed with normal coronary arteries and severely reduced EF of 25%.   SUBJECTIVE:   Ongoing delirium overnight. On precedex gtt and required PRN Haldol   VITAL SIGNS: BP (!) 162/91   Pulse (!) 25   Temp 98.8 F (37.1 C) (Core (Comment))   Resp (!) 25   Ht 6\' 2"  (1.88 m)   Wt 98.1 kg (216 lb 4.3 oz)   SpO2 100%   BMI 27.77 kg/m   HEMODYNAMICS:    VENTILATOR SETTINGS: Vent Mode: PRVC FiO2 (%):  [40 %] 40 % Set Rate:  [12 bmp-16 bmp] 12 bmp Vt Set:  [640 mL] 640 mL PEEP:  [5 cmH20] 5 cmH20 Plateau Pressure:  [13 cmH20-18 cmH20] 13 cmH20  INTAKE / OUTPUT: I/O last 3 completed shifts: In: 829.6 [I.V.:729.6; IV Piggyback:100] Out: 2440 [Urine:1865; Emesis/NG output:575]   PHYSICAL EXAMINATION: General appearance: Male, no distress Neuro: confused,moves extremities, responds to verbal stimuli   Eyes: anicteric sclerae icteric , moist conjunctivae Mouth:  MMM Neck: Trachea midline; neck supple, no JVD Lungs/chest: Rhonchi, non-labored  CV: Brady, no MRGs  Abdomen: Soft, non-tender; active bowel sounds  Extremities: -edema Skin: Cool, dry, Intact     LABS:  BMET  Recent Labs Lab 06/28/17 1214 06/29/17 0301 06/30/17 0500  NA 138 137 141  K 3.4* 4.0 3.4*  CL 107 106 100*  CO2 24 24 27   BUN 8 8 20   CREATININE 0.67 0.74 1.09  GLUCOSE 104* 123* 149*    Electrolytes  Recent Labs Lab 06/28/17 0036 06/28/17 1214 06/29/17 0301 06/30/17 0500  CALCIUM 8.1* 7.9* 7.9* 8.5*  MG 1.8  --  1.9 2.1  PHOS 1.9*  --  3.3 3.5    CBC  Recent Labs Lab 06/28/17 0646 06/29/17 0301 06/30/17 0500  WBC 12.7* 11.4*  13.0*  HGB 15.1 14.6 13.3  HCT 44.3 44.2 39.6  PLT 212 181 178    Coag's  Recent Labs Lab 06/26/17 2137 06/27/17 0146 06/27/17 0730  APTT 20* 21* 23*  INR 1.14 1.08 1.08    Sepsis Markers  Recent Labs Lab 06/26/17 2154  06/29/17 0301 06/29/17 0957 06/30/17 0500  LATICACIDVEN 11.42*  --   --   --   --   PROCALCITON  --   < > 2.00 1.92 1.54  < > = values in this interval not displayed.  ABG  Recent Labs Lab 06/28/17 0459 06/28/17 0912 06/30/17 0500  PHART 7.531* 7.408 7.403  PCO2ART 24.7* 33.6 46.2  PO2ART 113* 89.0 101    Liver Enzymes  Recent Labs Lab 06/26/17 2137 06/27/17 0146  AST 218* 202*  ALT 134* 173*  ALKPHOS 75 76  BILITOT 1.0 1.1  ALBUMIN 3.6 4.2    Cardiac Enzymes  Recent Labs Lab 06/27/17 0533 06/27/17 0843 06/27/17 1542  TROPONINI 3.27* 3.19* 2.78*    Glucose  Recent Labs Lab 06/29/17 1549 06/29/17 1946 06/29/17 2346 06/30/17 0401 06/30/17 0744 06/30/17 1116  GLUCAP 125* 138* 124* 127* 108* 116*    Imaging Dg Chest Port 1 View  Result Date: 06/30/2017 CLINICAL DATA:  Extubation EXAM: PORTABLE CHEST 1 VIEW COMPARISON:  06/29/2017. FINDINGS:  Interim extubation and removal of NG tube. Left IJ line stable position. Stable cardiomegaly. Improved aeration of both lungs. Partial clearing of bilateral pulmonary infiltrates/edema. No pleural effusion or pneumothorax . IMPRESSION: 1. Interim extubation and removal of NG tube. Left IJ line in stable position. 2. Improved aeration of both lungs. Partial clearing of bilateral pulmonary infiltrates/edema. 3. Stable cardiomegaly . Electronically Signed   By: Marcello Moores  Register   On: 06/30/2017 07:43    STUDIES:  LHC 8/20: There is severe left ventricular systolic dysfunction, LV end diastolic pressure is mildly elevated.The left ventricular ejection fraction is less than 25% by visual estimate. Configuration consistent with Takatsubo Syndrome PORT CXR 8/21: Endotracheal tube  approximately 8 cm from carina. Enteric feeding tube coursing below diaphragm. Left internal jugular central venous catheter in good position. No focal opacity or effusion appreciated. EEG 8/21 > Diffuse background suppression and slowing, occasional focal slowing over the right temporal region  CT Head 8/22 > No acute  CT C Spine 8/22 > No acute, multilevel segmentation anomaly  TTE 8/21 > EF 35-40%, G1DD, Moderately reduced systolic function   MICROBIOLOGY: MRSA PCR 8/21:  Negative Blood Cultures x2 8/21 > Negative  Urine Culture 8/21 > negative  Tracheal Aspirate Culture 8/21 > normal flora  Blood 8/23 >> Sputum 8/23 >> abundant staph aureus >> U/A 8/23 > Turbid, rare bacteria, negative leukocytes   ANTIBIOTICS: Zosyn 8/23 >>   SIGNIFICANT EVENTS: 8/20 - Admit & started therapeutic hypothermia post arrest 8/22 rewarm  LINES/TUBES: OETT 7.0 8/20 > 8/24 L [place] CVL 8/21 > 8/24  L RAD ART LINE 8/21 > 8/23  OGT > 8/24  FOLEY > 8/24  PIV  ASSESSMENT / PLAN:  PULMONARY A: Acute hypoxic respiratory failure: In the setting of cardiac arrest P:   Wean FiO2 for saturation > 94 Trend CXR Pulmonary Hygiene > IS   CARDIOVASCULAR A:  Bradycardia secondary to Precedex Inferior STEMI > Clear Vessels on Cath  Prolonged QTC > Resolved  -Ammio gtt stopped 8/22 Non-Ischemic Cardiomyopathy  Takatsubo Cardiomyopathy ? H/O HTN P:  Per Cardiology > May need EP consult for ICD  Cardiac Monitoring  EKG Daily  PRN Hydralazine for Systolic > 062 Hold preadmission amlodipine, hydrodiuril, toprol-xl.  RENAL A:   Acute renal failure: Resolved. -negative 4L P:   Strict I&0 Trend BMP  Replace Electrolytes as needed   GASTROINTESTINAL A:   No acute issues. -Speech Therapy Evaluation on 8/24 > do defects  P:   Clear Liquid diet > advance as tolerated  PPI   HEMATOLOGIC A:   Leukocytosis > Improving  P:  Trend CBC Daily   INFECTIOUS A:   Leukocytosis > Improving   Aspiration Event? -PCT 0.41 > 0.50 > 2.0 > 1.92  -Urine cloudy with sediment, U/A negative  P:   Trend WBC and Fever Curve Flow culture data  Continue Zosyn (Day 3)   ENDOCRINE A:   Hyperglycemia: Controlled.  P:   Trend Glucose  SSI Lantus 10 u Daily   NEUROLOGIC A:   Delirium  Acute encephalopathy: Question possible anoxic brain injury post arrest. -EEG 8/21 > Diffuse background suppression and slowing, occasional focal slowing over the right temporal region  -CT Head Negative on 8/22  -Reached goal temp for rewarming at 1400 8/22  P:   Fentanyl PRN for acute pain  Precedex gtt  Start Klonopin 0.5 mg BID  Cautious with Haldol given prolonged QTC  FAMILY  - Updates: Wife updated at bedside   -  Inter-disciplinary family meet or Palliative Care meeting due by:  07/03/2017   CC Time: [address]  Hayden Pedro, AGACNP-BC [facility]  Pgr: [phone]  PCCM Pgr: [phone]  Attending Note:  66 year old male s/p VF arrest that rewarmed 8/24 and self extubated.  He remains very confused with clear lungs on exam.  I reviewed CXR myself, ETT in good position.  Passed speech therapy. Start precedex for sedation.  Add clonazepam for agitation.  Plan is to take to the cath lab in AM for ICD placement.  PT evaluation and treatment.  Continue to hold lopressor as patient is bradycardic on precedex.  Minimize as able.  There is a concern for aspiration when patient self extubated.  Sputum culture positive for staph.  Will wait for sensitivities for change of abx.  The patient is critically ill with multiple organ systems failure and requires high complexity decision making for assessment and support, frequent evaluation and titration of therapies, application of advanced monitoring technologies and extensive interpretation of multiple databases.   Critical Care Time devoted to patient care services described in this note is  35  Minutes. This time reflects time  of care of this signee Dr Jennet Maduro. This critical care time does not reflect procedure time, or teaching time or supervisory time of PA/NP/Med student/Med Resident etc but could involve care discussion time.  Rush Farmer, M.D. [facility] Pulmonary/Critical Care Medicine. Pager: [phone]. After hours pager: [phone].

## 2017-06-30 NOTE — Progress Notes (Signed)
200 mg of Fentanyl wasted at this time in sink with Vita Erm, RN

## 2017-06-30 NOTE — Progress Notes (Signed)
 Progress Note  Patient Name: Jeffery Thomas Date of Encounter: 06/30/2017  Primary Cardiologist: Berry   Subjective   The patient has self-extubated. Wife is at bedside. Denies chest pain or shortness of breath.  Inpatient Medications    Scheduled Meds: . chlorhexidine gluconate (MEDLINE KIT)  15 mL Mouth Rinse BID  . Chlorhexidine Gluconate Cloth  6 each Topical Q0600  . heparin subcutaneous  5,000 Units Subcutaneous Q8H  . insulin aspart  2-6 Units Subcutaneous Q4H  . insulin glargine  10 Units Subcutaneous Q24H  . mouth rinse  15 mL Mouth Rinse 10 times per day  . methylPREDNISolone (SOLU-MEDROL) injection  20 mg Intravenous Q24H  . pantoprazole (PROTONIX) IV  40 mg Intravenous QHS  . sodium chloride flush  10-40 mL Intracatheter Q12H   Continuous Infusions: . sodium chloride    . piperacillin-tazobactam (ZOSYN)  IV Stopped (06/30/17 1018)  . potassium chloride 10 mEq (06/30/17 0952)   PRN Meds: sodium chloride, acetaminophen, fentaNYL (SUBLIMAZE) injection, hydrALAZINE, meperidine (DEMEROL) injection, ondansetron (ZOFRAN) IV, sodium chloride flush   Vital Signs    Vitals:   06/30/17 0700 06/30/17 0800 06/30/17 0900 06/30/17 0930  BP: (!) 153/81 (!) 157/97 (!) 160/90 (!) 171/86  Pulse: 79 85 76 85  Resp: (!) 25 (!) 24 (!) 24 (!) 34  Temp:  98.6 F (37 C)    TempSrc:  Core (Comment)    SpO2: 93% 96% 92% 96%  Weight:      Height:        Intake/Output Summary (Last 24 hours) at 06/30/17 1009 Last data filed at 06/30/17 0958  Gross per 24 hour  Intake           516.83 ml  Output             3440 ml  Net         -2923.17 ml   Filed Weights   06/27/17 0130 06/28/17 0352 06/29/17 0313  Weight: 208 lb 0.8 oz (94.4 kg) 208 lb 8.9 oz (94.6 kg) 216 lb 4.3 oz (98.1 kg)    Telemetry    Sinus rhythm without arrhythmia - Personally Reviewed   Physical Exam  Awake, alert, able to follow commands, but somewhat slow and confused GEN: No acute distress.   Neck:  No JVD Cardiac: RRR, no murmurs, rubs, or gallops.  Respiratory: Clear to auscultation bilaterally. GI: Soft, nontender, non-distended  MS: No edema; No deformity. Neuro:  Nonfocal. Good handgrip bilateral. Moves all 4 extremities to command.  Labs    Chemistry Recent Labs Lab 06/26/17 2137  06/27/17 0146  06/28/17 1214 06/29/17 0301 06/30/17 0500  NA 137  < > 137  < > 138 137 141  K 3.4*  < > 3.3*  < > 3.4* 4.0 3.4*  CL 98*  < > 99*  < > 107 106 100*  CO2 17*  --  24  < > 24 24 27  GLUCOSE 254*  < > 227*  < > 104* 123* 149*  BUN 22*  < > 21*  < > 8 8 20  CREATININE 1.60*  < > 0.96  < > 0.67 0.74 1.09  CALCIUM 8.0*  --  8.2*  < > 7.9* 7.9* 8.5*  PROT 5.9*  --  7.0  --   --   --   --   ALBUMIN 3.6  --  4.2  --   --   --   --   AST 218*  --    202*  --   --   --   --   ALT 134*  --  173*  --   --   --   --   ALKPHOS 75  --  76  --   --   --   --   BILITOT 1.0  --  1.1  --   --   --   --   GFRNONAA 44*  --  >60  < > >60 >60 >60  GFRAA 51*  --  >60  < > >60 >60 >60  ANIONGAP 22*  --  14  < > 7 7 14  < > = values in this interval not displayed.   Hematology Recent Labs Lab 06/28/17 0646 06/29/17 0301 06/30/17 0500  WBC 12.7* 11.4* 13.0*  RBC 5.27 5.02 4.56  HGB 15.1 14.6 13.3  HCT 44.3 44.2 39.6  MCV 84.1 88.0 86.8  MCH 28.7 29.1 29.2  MCHC 34.1 33.0 33.6  RDW 13.1 14.1 13.5  PLT 212 181 178    Cardiac Enzymes Recent Labs Lab 06/27/17 0146 06/27/17 0533 06/27/17 0843 06/27/17 1542  TROPONINI 2.03* 3.27* 3.19* 2.78*    Recent Labs Lab 06/26/17 2152  TROPIPOC 0.16*     BNPNo results for input(s): BNP, PROBNP in the last 168 hours.   DDimer No results for input(s): DDIMER in the last 168 hours.   Radiology    Dg Abd 1 View  Result Date: 06/29/2017 CLINICAL DATA:  Abdominal distention EXAM: ABDOMEN - 1 VIEW COMPARISON:  06/27/2017 FINDINGS: NG tube tip is in the mid to distal stomach. Nonobstructive bowel gas pattern. No organomegaly or evidence of  free air. IMPRESSION: NG tube tip in the mid to distal stomach. Nonobstructive bowel gas pattern. Electronically Signed   By: Kevin  Dover M.D.   On: 06/29/2017 08:58   Ct Head Wo Contrast  Result Date: 06/28/2017 CLINICAL DATA:  Cardiac arrest August 20th, ACLS time 20 minutes. EXAM: CT HEAD WITHOUT CONTRAST CT CERVICAL SPINE WITHOUT CONTRAST TECHNIQUE: Multidetector CT imaging of the head and cervical spine was performed following the standard protocol without intravenous contrast. Multiplanar CT image reconstructions of the cervical spine were also generated. COMPARISON:  None. FINDINGS: CT HEAD FINDINGS BRAIN: No intraparenchymal hemorrhage, mass effect nor midline shift. The ventricles and sulci are normal for age. No acute large vascular territory infarcts. No abnormal extra-axial fluid collections. Basal cisterns are patent. VASCULAR: Mild calcific atherosclerosis of the carotid siphons. SKULL: No skull fracture. No significant scalp soft tissue swelling. SINUSES/ORBITS: The mastoid air-cells and included paranasal sinuses are well-aerated.Status post LEFT ocular lens implant. Bilateral scleral calcifications. OTHER: Life-support lines in place. CT CERVICAL SPINE FINDINGS ALIGNMENT: Maintained lordosis. Vertebral bodies in alignment. Mild S-type scoliosis. SKULL BASE AND VERTEBRAE: Cervical vertebral bodies and posterior elements are intact. 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 Severe C5-6 disc height loss, vacuum disc and endplate spurring. SOFT TISSUES AND SPINAL CANAL: Nonacute. Moderate calcific atherosclerosis RIGHT greater than LEFT carotid bulbs. DISC LEVELS: Mild canal stenosis C4-5 and C5-6. Mild LEFT C4-5, severe LEFT and mild RIGHT C5-6, mild LEFT C7-T1 neural foraminal narrowing. UPPER CHEST: Lung apices are clear. OTHER: None. IMPRESSION: CT HEAD: Negative noncontrast CT HEAD for age. CT CERVICAL SPINE: 1. No acute fracture or malalignment. 2. Multilevel segmentation anomaly. Electronically  Signed   By: Courtnay  Bloomer M.D.   On: 06/28/2017 18:35   Ct Cervical Spine Wo Contrast  Result Date: 06/28/2017 CLINICAL DATA:  Cardiac arrest August 20th, ACLS time   20 minutes. EXAM: CT HEAD WITHOUT CONTRAST CT CERVICAL SPINE WITHOUT CONTRAST TECHNIQUE: Multidetector CT imaging of the head and cervical spine was performed following the standard protocol without intravenous contrast. Multiplanar CT image reconstructions of the cervical spine were also generated. COMPARISON:  None. FINDINGS: CT HEAD FINDINGS BRAIN: No intraparenchymal hemorrhage, mass effect nor midline shift. The ventricles and sulci are normal for age. No acute large vascular territory infarcts. No abnormal extra-axial fluid collections. Basal cisterns are patent. VASCULAR: Mild calcific atherosclerosis of the carotid siphons. SKULL: No skull fracture. No significant scalp soft tissue swelling. SINUSES/ORBITS: The mastoid air-cells and included paranasal sinuses are well-aerated.Status post LEFT ocular lens implant. Bilateral scleral calcifications. OTHER: Life-support lines in place. CT CERVICAL SPINE FINDINGS ALIGNMENT: Maintained lordosis. Vertebral bodies in alignment. Mild S-type scoliosis. SKULL BASE AND VERTEBRAE: Cervical vertebral bodies and posterior elements are intact. 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 Severe C5-6 disc height loss, vacuum disc and endplate spurring. SOFT TISSUES AND SPINAL CANAL: Nonacute. Moderate calcific atherosclerosis RIGHT greater than LEFT carotid bulbs. DISC LEVELS: Mild canal stenosis C4-5 and C5-6. Mild LEFT C4-5, severe LEFT and mild RIGHT C5-6, mild LEFT C7-T1 neural foraminal narrowing. UPPER CHEST: Lung apices are clear. OTHER: None. IMPRESSION: CT HEAD: Negative noncontrast CT HEAD for age. CT CERVICAL SPINE: 1. No acute fracture or malalignment. 2. Multilevel segmentation anomaly. Electronically Signed   By: Elon Alas M.D.   On: 06/28/2017 18:35   Dg Chest Port 1 View  Result  Date: 06/30/2017 CLINICAL DATA:  Extubation EXAM: PORTABLE CHEST 1 VIEW COMPARISON:  06/29/2017. FINDINGS: Interim extubation and removal of NG tube. Left IJ line stable position. Stable cardiomegaly. Improved aeration of both lungs. Partial clearing of bilateral pulmonary infiltrates/edema. No pleural effusion or pneumothorax . IMPRESSION: 1. Interim extubation and removal of NG tube. Left IJ line in stable position. 2. Improved aeration of both lungs. Partial clearing of bilateral pulmonary infiltrates/edema. 3. Stable cardiomegaly . Electronically Signed   By: Marcello Moores  Register   On: 06/30/2017 07:43   Dg Chest Port 1 View  Result Date: 06/29/2017 CLINICAL DATA:  Respiratory failure, intubated patient, status post 7 cardiac arrest. EXAM: PORTABLE CHEST 1 VIEW COMPARISON:  Chest x-ray of June 27, 2017 FINDINGS: The lungs are reasonably well inflated. There are coarse alveolar infiltrates in the mid and lower lungs bilaterally more conspicuous today. The retrocardiac region on the left is dense. There is no pneumothorax or significant pleural effusion. The esophagogastric tube tip in proximal port project below the inferior margin of the image. A left internal jugular venous catheter tip projects over the distal third of the SVC. The endotracheal tube tip lies approximately 3.9 cm above the carina. IMPRESSION: Mild interval worsening in the appearance of the mid and lower lungs bilaterally. This may reflect alveolar edema but aspiration pneumonia is felt likely. Electronically Signed   By: David  Martinique M.D.   On: 06/29/2017 07:27    Cardiac Studies   2-D echocardiogram 06/28/2017: Study Conclusions  - Left ventricle: The cavity size was normal. Wall thickness was   increased in a pattern of mild LVH. Systolic function was   moderately reduced. The estimated ejection fraction was in the   range of 35% to 40%. Diffuse hypokinesis. Doppler parameters are   consistent with abnormal left ventricular  relaxation (grade 1   diastolic dysfunction). - Aortic valve: There was no stenosis. - Mitral valve: There was no significant regurgitation. - Right ventricle: The cavity size was normal. Systolic function  was mildly reduced. - Pulmonary arteries: No complete TR doppler jet so unable to   estimate PA systolic pressure. - Systemic veins: The IVC measured 2.2 cm with < 50% respirophasic   variation, suggesting RA pressure 15 mmHg.  Impressions:  - Normal LV size with mild LV hypertrophy. EF 35-40%, diffuse   hypokinesis. Normal RV size with mildly decreased systolic   function. No significant valvular abnormalities.   Patient Profile     66 y.o. male with history of HTN, HLD, GERD admitted following out of hospital cardiac arrest. He reported chest pain to his wife then collapsed in the other room. AED applied by EMS and pt was shocked. He then had PEA and then v. Fib with ROSC with 20 minutes of CPR. Emergent cardiac cath with normal coronary arteries. LVEF severely reduced at 25% on cath.   Assessment & Plan    1. Ventricular fibrillation cardiac arrest 2. Nonischemic cardiomyopathy, LVEF 35-40% 3. Anoxic encephalopathy  The patient's heart rhythm is stable. He is nothing by mouth after self extubation. I am encouraged by the fact that he is following commands even know he is clearly confused. Will start medical therapy for his cardiomyopathy. Since he is not taking oral medications, we'll start IV metoprolol as a scheduled medication. Will ask for an EP consultation probably on Monday after he has some more time to recover neurologically. Considerations would be a LifeVest versus going directly to an ICD for secondary prevention of sudden cardiac death.  Deatra James, MD  06/30/2017, 10:09 AM

## 2017-06-30 NOTE — Progress Notes (Signed)
eLink Physician-Brief Progress Note Patient Name: YAAKOV SAINDON DOB: 01/28/1951 MRN: [id number]   Date of Service  06/30/2017  HPI/Events of Note  Patient remains agitated and confused on precedex gtt.  Has bedside sitter and family member in room.  Bradycardia with HR in the 50s.  Qtc less than 500.  eICU Interventions  Plan: No change in precedex dosing due to bradycardia Haldol 5 mg IV time one.  Nurse to contact [facility] MD if patient remains agitated Bilateral soft wrist restraints for patient safety.     Intervention Category Major Interventions: Delirium, psychosis, severe agitation - evaluation and management  Kendarious Gudino 06/30/2017, 10:25 PM

## 2017-06-30 NOTE — Progress Notes (Signed)
Pt anxious and agitated, disoriented x 4. Pt on precedex infusion at max dose (1.2 mcg/kg/hr). Family and safety sitter at bedside. Pt continues to scream and thrash about the bed, states he wants to go home. Attempted to educate pt on need to stay in hospital, no evidence of learning. Pt became combative and attempted to strike at the safety sitter, multiple RNs came to assist. Elink MD contacted regarding these events - orders for haldol 5 mg once and bilateral wrist restraints. MD advised this RN to call back in 30 minutes if haldol not effective. Orders implemented. Attempted to educate pt regarding behavior required for restraints to be removed. No evidence of learning at this time. Will continue to monitor pt closely.  Sherlie Ban, RN

## 2017-06-30 NOTE — Evaluation (Signed)
  Clinical/Bedside Swallow Evaluation Patient Details  Name: Jeffery Thomas MRN: [id number] Date of Birth: 21-Oct-1951  Today's Date: 06/30/2017 Time: SLP Start Time (ACUTE ONLY): 41 SLP Stop Time (ACUTE ONLY): 1405 SLP Time Calculation (min) (ACUTE ONLY): 10 min  Past Medical History:  Past Medical History:  Diagnosis Date  . Hypertension    Past Surgical History:  Past Surgical History:  Procedure Laterality Date  . LEFT HEART CATH AND CORONARY ANGIOGRAPHY N/A 06/26/2017   Procedure: LEFT HEART CATH AND CORONARY ANGIOGRAPHY;  Surgeon: Lorretta Harp, MD;  Location: [facility];  Service: Cardiovascular;  Laterality: N/A;   HPI:  66 year old male presents to ED 8/20 after cardiac arrest (ACLS about 20 minutes) found to have inferior STEMI. Emergent Cath performed with normal coronary arteries and severely reduced EF of 25%. ETT 8/20, self-extubated 8/24. Pt with acute encephalopathy; concerns for anoxia.     Assessment / Plan / Recommendation Clinical Impression  Pt presents with functional oropharyngeal swallow with adequate mastication, brisk swallow response, no overt s/s of aspiration.  Passed three oz water test without deficit.  Phonation strong.  Wife present for exam.  Rec initiating clear liquid diet; RN to advance when appropriate (there are no restrictions with regard to consistency).  SLP Visit Diagnosis: Dysphagia, unspecified (R13.10)    Aspiration Risk  No limitations    Diet Recommendation   clear liquids; advance as tolerated  Medication Administration: Whole meds with liquid    Other  Recommendations Oral Care Recommendations: Oral care BID   Follow up Recommendations None      Frequency and Duration            Prognosis Prognosis for Safe Diet Advancement: Good      Swallow Study   General Date of Onset: 06/26/17 HPI: 66 year old male presents to ED 8/20 after cardiac arrest (ACLS about 20 minutes) found to have inferior STEMI. Emergent  Cath performed with normal coronary arteries and severely reduced EF of 25%. ETT 8/20, self-extubated 8/24. Pt with acute encephalopathy; concerns for anoxia.   Type of Study: Bedside Swallow Evaluation Previous Swallow Assessment: no Diet Prior to this Study: NPO Temperature Spikes Noted: No Respiratory Status: Nasal cannula History of Recent Intubation: Yes Length of Intubations (days): 4 days Date extubated: 06/30/17 Behavior/Cognition: Alert;Confused Oral Cavity Assessment: Within Functional Limits Oral Care Completed by SLP: Recent completion by staff Oral Cavity - Dentition: Adequate natural dentition Vision: Functional for self-feeding Self-Feeding Abilities: Able to feed self;Needs assist Patient Positioning: Upright in bed Baseline Vocal Quality: Normal Volitional Cough: Strong Volitional Swallow: Able to elicit    Oral/Motor/Sensory Function Overall Oral Motor/Sensory Function: Within functional limits   Ice Chips Ice chips: Within functional limits   Thin Liquid Thin Liquid: Within functional limits    Nectar Thick Nectar Thick Liquid: Not tested   Honey Thick Honey Thick Liquid: Not tested   Puree Puree: Within functional limits   Solid   GO   Solid: Within functional limits        Juan Quam Laurice 06/30/2017,2:14 PM   Estill Bamberg L. Tivis Ringer, [place] CCC/SLP Pager [phone]

## 2017-06-30 NOTE — Progress Notes (Signed)
C collar removed at this time per NP, pt also stating that his neck does not hurt. Pt CT also does not show a fracture. RN will continue to monitor.

## 2017-07-01 DIAGNOSIS — G9340 Encephalopathy, unspecified: Secondary | ICD-10-CM

## 2017-07-01 DIAGNOSIS — R001 Bradycardia, unspecified: Secondary | ICD-10-CM

## 2017-07-01 DIAGNOSIS — I10 Essential (primary) hypertension: Secondary | ICD-10-CM

## 2017-07-01 LAB — GLUCOSE, CAPILLARY
GLUCOSE-CAPILLARY: 102 mg/dL — AB (ref 65–99)
Glucose-Capillary: 114 mg/dL — ABNORMAL HIGH (ref 65–99)
Glucose-Capillary: 117 mg/dL — ABNORMAL HIGH (ref 65–99)
Glucose-Capillary: 139 mg/dL — ABNORMAL HIGH (ref 65–99)
Glucose-Capillary: 139 mg/dL — ABNORMAL HIGH (ref 65–99)
Glucose-Capillary: 141 mg/dL — ABNORMAL HIGH (ref 65–99)
Glucose-Capillary: 224 mg/dL — ABNORMAL HIGH (ref 65–99)

## 2017-07-01 LAB — BASIC METABOLIC PANEL
ANION GAP: 10 (ref 5–15)
BUN: 29 mg/dL — ABNORMAL HIGH (ref 6–20)
CHLORIDE: 104 mmol/L (ref 101–111)
CO2: 27 mmol/L (ref 22–32)
CREATININE: 0.97 mg/dL (ref 0.61–1.24)
Calcium: 8.6 mg/dL — ABNORMAL LOW (ref 8.9–10.3)
GFR calc non Af Amer: >60 mL/min (ref >60)
Glucose, Bld: 137 mg/dL — ABNORMAL HIGH (ref 65–99)
POTASSIUM: 4.3 mmol/L (ref 3.5–5.1)
Sodium: 141 mmol/L (ref 135–145)

## 2017-07-01 LAB — CBC
HEMATOCRIT: 38.6 % — AB (ref 39.0–52.0)
HEMOGLOBIN: 13.2 g/dL (ref 13.0–17.0)
MCH: 29.6 pg (ref 26.0–34.0)
MCHC: 34.2 g/dL (ref 30.0–36.0)
MCV: 86.5 fL (ref 78.0–100.0)
Platelets: 166 10*3/uL (ref 150–400)
RBC: 4.46 MIL/uL (ref 4.22–5.81)
RDW: 13.3 % (ref 11.5–15.5)
WBC: 11.9 10*3/uL — AB (ref 4.0–10.5)

## 2017-07-01 LAB — PHOSPHORUS: Phosphorus: 3.8 mg/dL (ref 2.5–4.6)

## 2017-07-01 LAB — PROCALCITONIN: Procalcitonin: 0.87 ng/mL

## 2017-07-01 LAB — MAGNESIUM: Magnesium: 2.5 mg/dL — ABNORMAL HIGH (ref 1.7–2.4)

## 2017-07-01 MED ORDER — METOPROLOL TARTRATE 12.5 MG HALF TABLET
12.5000 mg | ORAL_TABLET | Freq: Two times a day (BID) | ORAL | Status: DC
  Administered 2017-07-02: 12.5 mg via ORAL
  Filled 2017-07-01 (×2): qty 1

## 2017-07-01 MED ORDER — ORAL CARE MOUTH RINSE
15.0000 mL | Freq: Two times a day (BID) | OROMUCOSAL | Status: DC
  Administered 2017-07-01 (×2): 15 mL via OROMUCOSAL

## 2017-07-01 MED ORDER — LOSARTAN POTASSIUM 25 MG PO TABS
25.0000 mg | ORAL_TABLET | Freq: Every day | ORAL | Status: DC
  Administered 2017-07-01 – 2017-07-02 (×2): 25 mg via ORAL
  Filled 2017-07-01 (×3): qty 1

## 2017-07-01 MED ORDER — CLONAZEPAM 0.5 MG PO TABS
0.5000 mg | ORAL_TABLET | Freq: Two times a day (BID) | ORAL | Status: DC
  Administered 2017-07-01 – 2017-07-05 (×8): 0.5 mg via ORAL
  Filled 2017-07-01 (×8): qty 1

## 2017-07-01 NOTE — Plan of Care (Signed)
Problem: Safety: Goal: Ability to remain free from injury will improve Outcome: Progressing Patient is alert, calm, cooperative.  Problem: Pain Managment: Goal: General experience of comfort will improve Outcome: Progressing Denies pain  Problem: Skin Integrity: Goal: Risk for impaired skin integrity will decrease Outcome: Progressing Able to turn on own; able to request urinal  Problem: Activity: Goal: Risk for activity intolerance will decrease Outcome: Progressing Able to turn on own; chair today  Problem: Bowel/Gastric: Goal: Will not experience complications related to bowel motility Outcome: Progressing BM today  Problem: Neurologic: Goal: Promote progressive neurologic recovery Outcome: Progressing Orientation improving

## 2017-07-01 NOTE — Progress Notes (Signed)
PULMONARY / CRITICAL CARE MEDICINE   Name: Jeffery Thomas MRN: [id number] DOB: 1951/04/24    ADMISSION DATE:  06/26/2017 CONSULTATION DATE:  06/26/17  REFERRING MD:  Aundra Dubin  CHIEF COMPLAINT:  Cardiac Arrest  Brief:   66 year old male presents to ED 8/20 after cardiac arrest (ACLS about 20 minutes) found to have inferior STEMI. Emergent Cath performed with normal coronary arteries and severely reduced EF of 25%.   SUBJECTIVE:   Precedex off at 2pm 8/25. Decreased agitation overnight   VITAL SIGNS: BP 113/67   Pulse (!) 47   Temp 97.9 F (36.6 C) (Axillary)   Resp (!) 29   Ht 6\' 2"  (1.88 m)   Wt 98.1 kg (216 lb 4.3 oz)   SpO2 100%   BMI 27.77 kg/m   HEMODYNAMICS:    VENTILATOR SETTINGS:    INTAKE / OUTPUT: I/O last 3 completed shifts: In: 876.2 [P.O.:120; I.V.:556.2; IV Piggyback:200] Out: 5784 [Urine:4370; Emesis/NG output:150]   PHYSICAL EXAMINATION: General appearance: Male, no distress Neuro: Alert, oriented x 3, moves extremities  Eyes: anicteric sclerae icteric , moist conjunctivae Mouth:  MMM Neck: Trachea midline; neck supple, no JVD Lungs/chest: Diminished breath sounds, non-labored  CV: Brady, no MRGs  Abdomen: Soft, non-tender; active bowel sounds  Extremities: -edema Skin: Cool, dry, Intact     LABS:  BMET  Recent Labs Lab 06/30/17 0500 06/30/17 1752 07/01/17 0243  NA 141 142 141  K 3.4* 3.7 4.3  CL 100* 102 104  CO2 27 27 27   BUN 20 24* 29*  CREATININE 1.09 1.05 0.97  GLUCOSE 149* 158* 137*    Electrolytes  Recent Labs Lab 06/29/17 0301 06/30/17 0500 06/30/17 1752 07/01/17 0243  CALCIUM 7.9* 8.5* 8.9 8.6*  MG 1.9 2.1  --  2.5*  PHOS 3.3 3.5  --  3.8    CBC  Recent Labs Lab 06/29/17 0301 06/30/17 0500 07/01/17 0243  WBC 11.4* 13.0* 11.9*  HGB 14.6 13.3 13.2  HCT 44.2 39.6 38.6*  PLT 181 178 166    Coag's  Recent Labs Lab 06/26/17 2137 06/27/17 0146 06/27/17 0730  APTT 20* 21* 23*  INR 1.14 1.08 1.08     Sepsis Markers  Recent Labs Lab 06/26/17 2154  06/29/17 0957 06/30/17 0500 07/01/17 0243  LATICACIDVEN 11.42*  --   --   --   --   PROCALCITON  --   < > 1.92 1.54 0.87  < > = values in this interval not displayed.  ABG  Recent Labs Lab 06/28/17 0459 06/28/17 0912 06/30/17 0500  PHART 7.531* 7.408 7.403  PCO2ART 24.7* 33.6 46.2  PO2ART 113* 89.0 101    Liver Enzymes  Recent Labs Lab 06/26/17 2137 06/27/17 0146  AST 218* 202*  ALT 134* 173*  ALKPHOS 75 76  BILITOT 1.0 1.1  ALBUMIN 3.6 4.2    Cardiac Enzymes  Recent Labs Lab 06/27/17 0533 06/27/17 0843 06/27/17 1542  TROPONINI 3.27* 3.19* 2.78*    Glucose  Recent Labs Lab 06/30/17 1548 06/30/17 1916 07/01/17 0009 07/01/17 0333 07/01/17 0739 07/01/17 1125  GLUCAP 124* 146* 114* 139* 139* 224*    Imaging No results found.  STUDIES:  LHC 8/20: There is severe left ventricular systolic dysfunction, LV end diastolic pressure is mildly elevated.The left ventricular ejection fraction is less than 25% by visual estimate. Configuration consistent with Takatsubo Syndrome PORT CXR 8/21: Endotracheal tube approximately 8 cm from carina. Enteric feeding tube coursing below diaphragm. Left internal jugular central venous catheter in  good position. No focal opacity or effusion appreciated. EEG 8/21 > Diffuse background suppression and slowing, occasional focal slowing over the right temporal region  CT Head 8/22 > No acute  CT C Spine 8/22 > No acute, multilevel segmentation anomaly  TTE 8/21 > EF 35-40%, G1DD, Moderately reduced systolic function   MICROBIOLOGY: MRSA PCR 8/21:  Negative Blood Cultures x2 8/21 > Negative  Urine Culture 8/21 > negative  Tracheal Aspirate Culture 8/21 > normal flora  Blood 8/23 >>MSSA Sputum 8/23 >> abundant staph aureus >> U/A 8/23 > Turbid, rare bacteria, negative leukocytes   ANTIBIOTICS: Zosyn 8/23>>8/26 Augmentin 8/26>>>8/30  SIGNIFICANT EVENTS: 8/20 - Admit  & started therapeutic hypothermia post arrest 8/22 - rewarm  LINES/TUBES: OETT 7.0 8/20 > 8/24 L [place] CVL 8/21 > 8/24  L RAD ART LINE 8/21 > 8/23  OGT > 8/24  FOLEY > 8/24  PIV  ASSESSMENT / PLAN:  PULMONARY A: Acute hypoxic respiratory failure: In the setting of cardiac arrest +HCAP P:   Wean FiO2 for saturation > 94 (Currently on 3L [place])  Trend CXR Pulmonary Hygiene > IS, Flutter, Mobilize    CARDIOVASCULAR A:  Bradycardia secondary to Precedex Inferior STEMI > Clear Vessels on Cath  Prolonged QTC > Resolved  -Ammio gtt stopped 8/22 Non-Ischemic Cardiomyopathy  Takatsubo Cardiomyopathy ? H/O HTN P:  Per Cardiology > May need EP consult for ICD  Cardiac Monitoring  EKG Daily  Scheduled Cozaar and Lopressor  PRN Hydralazine for Systolic > 009 Hold preadmission amlodipine, hydrodiuril, toprol-xl.  RENAL A:   Acute renal failure: Resolved. -negative 4L P:   Strict I&0 Trend BMP  Replace Electrolytes as needed   GASTROINTESTINAL A:   No acute issues. -Speech Therapy Evaluation on 8/24 > do defects  P:   Heart Healthy Diet  PPI   HEMATOLOGIC A:   Leukocytosis > Improving  P:  Trend CBC Daily   INFECTIOUS A:   Leukocytosis > Improving  Aspiration Event? -PCT 0.41 > 0.50 > 2.0 > 1.92  -Urine cloudy with sediment, U/A negative  -Sputum culture sensitives pending  P:   Trend WBC and Fever Curve Flow culture data  Zosyn (Day 4), switch to augmentin  ENDOCRINE A:   Hyperglycemia: Controlled.  P:   Trend Glucose  SSI Lantus 10 u Daily   NEUROLOGIC A:   Delirium  Acute encephalopathy: Question possible anoxic brain injury post arrest. -EEG 8/21 > Diffuse background suppression and slowing, occasional focal slowing over the right temporal region  -CT Head Negative on 8/22  -Reached goal temp for rewarming at 1400 8/22  P:   Sitter at bedside  Fentanyl PRN for acute pain  Precedex gtt off 8/25  Klonopin 0.5 mg BID  Cautious with Haldol given  prolonged QTC  FAMILY  - Updates: Wife updated at bedside   - Inter-disciplinary family meet or Palliative Care meeting due by:  07/03/2017  CC Time: 71  Patient is okay from PCCM standpoint to transfer out of ICU. Will sign off at this time. Please call back if needed.   Hayden Pedro, AGACNP-BC [facility]  Pgr: [phone]  PCCM Pgr: [phone]  Attending Note:  65 year old male s/p VF arrest and aspiration PNA with sensitive staph.  On exam, more appropriate this AM with relatively clear lungs.  I reviewed CXR myself, infiltrate noted.  Discussed with PCCM-NP.  Respiratory failure post arrest  - Monitor for airway protection  Hypoxemia:  - Titrate O2 for  sat of 88-92%  - If continues to need O2 may need an ambulatory desat study prior to discharge for home O2  PNA:  - D/C zosyn  - Start Augmentin PO for a total of 8 days (4 more days, order in)  Agitation  - Clonopin  Ok to transfer out of the ICU.  PCCM will sign off, please call back if needed.  Patient seen and examined, agree with above note.  I dictated the care and orders written for this patient under my direction.  Rush Farmer, [place]

## 2017-07-01 NOTE — Progress Notes (Signed)
Progress Note  Patient Name: Jeffery Thomas Date of Encounter: 07/01/2017  Primary Cardiologist:   Dr. Gwenlyn Found  Subjective   Very confused but no acute complaints.  No chest pain.    Inpatient Medications    Scheduled Meds: . clonazePAM  0.5 mg Oral BID  . heparin subcutaneous  5,000 Units Subcutaneous Q8H  . insulin aspart  2-6 Units Subcutaneous Q4H  . insulin glargine  10 Units Subcutaneous Q24H  . mouth rinse  15 mL Mouth Rinse BID  . metoprolol tartrate  5 mg Intravenous Q6H  . pantoprazole (PROTONIX) IV  40 mg Intravenous QHS   Continuous Infusions: . sodium chloride    . dexmedetomidine 1 mcg/kg/hr (07/01/17 0858)  . piperacillin-tazobactam (ZOSYN)  IV 3.375 g (07/01/17 0600)   PRN Meds: sodium chloride, acetaminophen, fentaNYL (SUBLIMAZE) injection, hydrALAZINE, midazolam, ondansetron (ZOFRAN) IV   Vital Signs    Vitals:   07/01/17 0700 07/01/17 0743 07/01/17 0800 07/01/17 0900  BP: (!) 153/93  (!) 116/102 (!) 154/81  Pulse: (!) 53  (!) 45 (!) 44  Resp: (!) 26  (!) 21 (!) 22  Temp:  98.4 F (36.9 C)    TempSrc:  Axillary    SpO2: 100%  100% 100%  Weight:      Height:        Intake/Output Summary (Last 24 hours) at 07/01/17 0938 Last data filed at 07/01/17 0900  Gross per 24 hour  Intake           929.96 ml  Output             2625 ml  Net         -1695.04 ml   Filed Weights   06/27/17 0130 06/28/17 0352 06/29/17 0313  Weight: 208 lb 0.8 oz (94.4 kg) 208 lb 8.9 oz (94.6 kg) 216 lb 4.3 oz (98.1 kg)    Telemetry    NSR - Personally Reviewed  ECG    NSR, rate 56, PACs, lateral T wave inversion and QT prolonged - Personally Reviewed  Physical Exam   GEN: No acute distress.   Neck: No  JVD Cardiac: RRR, no murmurs, rubs, or gallops.  Respiratory: Clear  to auscultation bilaterally. GI: Soft, nontender, non-distended  MS: No  edema; No deformity. Neuro:  Nonfocal but very confused Psych:  Acute delerium  Labs    Chemistry Recent  Labs Lab 06/26/17 2137  06/27/17 0146  06/30/17 0500 06/30/17 1752 07/01/17 0243  NA 137  < > 137  < > 141 142 141  K 3.4*  < > 3.3*  < > 3.4* 3.7 4.3  CL 98*  < > 99*  < > 100* 102 104  CO2 17*  --  24  < > 27 27 27   GLUCOSE 254*  < > 227*  < > 149* 158* 137*  BUN 22*  < > 21*  < > 20 24* 29*  CREATININE 1.60*  < > 0.96  < > 1.09 1.05 0.97  CALCIUM 8.0*  --  8.2*  < > 8.5* 8.9 8.6*  PROT 5.9*  --  7.0  --   --   --   --   ALBUMIN 3.6  --  4.2  --   --   --   --   AST 218*  --  202*  --   --   --   --   ALT 134*  --  173*  --   --   --   --  ALKPHOS 75  --  76  --   --   --   --   BILITOT 1.0  --  1.1  --   --   --   --   GFRNONAA 44*  --  >60  < > >60 >60 >60  GFRAA 51*  --  >60  < > >60 >60 >60  ANIONGAP 22*  --  14  < > 14 13 10   < > = values in this interval not displayed.   Hematology Recent Labs Lab 06/29/17 0301 06/30/17 0500 07/01/17 0243  WBC 11.4* 13.0* 11.9*  RBC 5.02 4.56 4.46  HGB 14.6 13.3 13.2  HCT 44.2 39.6 38.6*  MCV 88.0 86.8 86.5  MCH 29.1 29.2 29.6  MCHC 33.0 33.6 34.2  RDW 14.1 13.5 13.3  PLT 181 178 166    Cardiac Enzymes Recent Labs Lab 06/27/17 0146 06/27/17 0533 06/27/17 0843 06/27/17 1542  TROPONINI 2.03* 3.27* 3.19* 2.78*    Recent Labs Lab 06/26/17 2152  TROPIPOC 0.16*     BNPNo results for input(s): BNP, PROBNP in the last 168 hours.   DDimer No results for input(s): DDIMER in the last 168 hours.   Radiology    Dg Chest Port 1 View  Result Date: 06/30/2017 CLINICAL DATA:  Extubation EXAM: PORTABLE CHEST 1 VIEW COMPARISON:  06/29/2017. FINDINGS: Interim extubation and removal of NG tube. Left IJ line stable position. Stable cardiomegaly. Improved aeration of both lungs. Partial clearing of bilateral pulmonary infiltrates/edema. No pleural effusion or pneumothorax . IMPRESSION: 1. Interim extubation and removal of NG tube. Left IJ line in stable position. 2. Improved aeration of both lungs. Partial clearing of bilateral  pulmonary infiltrates/edema. 3. Stable cardiomegaly . Electronically Signed   By: Marcello Moores  Register   On: 06/30/2017 07:43    Cardiac Studies    2-D echocardiogram 06/28/2017: Study Conclusions  - Left ventricle: The cavity size was normal. Wall thickness was increased in a pattern of mild LVH. Systolic function was moderately reduced. The estimated ejection fraction was in the range of 35% to 40%. Diffuse hypokinesis. Doppler parameters are consistent with abnormal left ventricular relaxation (grade 1 diastolic dysfunction). - Aortic valve: There was no stenosis. - Mitral valve: There was no significant regurgitation. - Right ventricle: The cavity size was normal. Systolic function was mildly reduced. - Pulmonary arteries: No complete TR doppler jet so unable to estimate PA systolic pressure. - Systemic veins: The IVC measured 2.2 cm with < 50% respirophasic variation, suggesting RA pressure 15 mmHg.  Impressions:  - Normal LV size with mild LV hypertrophy. EF 35-40%, diffuse hypokinesis. Normal RV size with mildly decreased systolic function. No significant valvular abnormalities.  Patient Profile     66 y.o. male with history of HTN, HLD, GERD admitted following out of hospital cardiac arrest. He reported chest pain to his wife then collapsed in the other room. AED applied by EMS and pt was shocked. He then had PEA and then v. Fib with ROSC with 20 minutes of CPR. Emergent cardiac cath with normal coronary arteries. LVEF severely reduced at 25% on cath.  Assessment & Plan    RESPIRATORY:  Self extubated.  Oxygenating well and mechanics OK.  Continue good pulmonary toilet  CARDIAC ARREST:  Out of hospital.  Resuscitated.  No CAD.  Reduced EF.  Medical management   NONISCHEMIC CARDIOMYOPATHY:  EP consult Monday.  Start Cozaar.  Change to PO beta blocker.  Titrate again likely in AM.  Hydralazine PRN HTN  DELIRIUM:  Meds as listed weaning Precedex which  is possibly contributing to bradycardia.    Signed, Minus Breeding, MD  07/01/2017, 9:38 AM

## 2017-07-01 NOTE — Progress Notes (Signed)
Pt safety sitter being pulled to ED at this time. Will continue to closely monitor.  Sherlie Ban, RN

## 2017-07-01 NOTE — Progress Notes (Signed)
PT Cancellation Note  Patient Details Name: Jeffery Thomas MRN: [id number] DOB: [birth year]   Cancelled Treatment:    Reason Eval/Treat Not Completed: Other (comment). Pt's RN reported pt has been confused and combative, currently in restraints. PT will continue to follow up with pt as available and appropriate.    [place] 07/01/2017, 9:54 AM

## 2017-07-01 NOTE — Plan of Care (Signed)
Problem: Progression Barriers to Patient Restraint Goal: Returns to Baseline, No longer Needs Restraint Outcome: Completed/Met Date Met: 07/01/17 Patient responding to limit setting and redirection.  No longer interfering with care.  Bilateral wrist restraints removed.

## 2017-07-02 DIAGNOSIS — J189 Pneumonia, unspecified organism: Secondary | ICD-10-CM

## 2017-07-02 DIAGNOSIS — R451 Restlessness and agitation: Secondary | ICD-10-CM

## 2017-07-02 LAB — CBC
HEMATOCRIT: 43.6 % (ref 39.0–52.0)
Hemoglobin: 13.7 g/dL (ref 13.0–17.0)
MCH: 28.1 pg (ref 26.0–34.0)
MCHC: 31.4 g/dL (ref 30.0–36.0)
MCV: 89.5 fL (ref 78.0–100.0)
PLATELETS: 177 10*3/uL (ref 150–400)
RBC: 4.87 MIL/uL (ref 4.22–5.81)
RDW: 13.9 % (ref 11.5–15.5)
WBC: 12.4 10*3/uL — ABNORMAL HIGH (ref 4.0–10.5)

## 2017-07-02 LAB — GLUCOSE, CAPILLARY
Glucose-Capillary: 107 mg/dL — ABNORMAL HIGH (ref 65–99)
Glucose-Capillary: 102 mg/dL — ABNORMAL HIGH (ref 65–99)
Glucose-Capillary: 121 mg/dL — ABNORMAL HIGH (ref 65–99)
Glucose-Capillary: 109 mg/dL — ABNORMAL HIGH (ref 65–99)
Glucose-Capillary: 103 mg/dL — ABNORMAL HIGH (ref 65–99)

## 2017-07-02 LAB — CULTURE, BLOOD (ROUTINE X 2)
CULTURE: NO GROWTH
CULTURE: NO GROWTH
SPECIAL REQUESTS: ADEQUATE
Special Requests: ADEQUATE

## 2017-07-02 LAB — PHOSPHORUS: Phosphorus: 4.3 mg/dL (ref 2.5–4.6)

## 2017-07-02 LAB — CULTURE, RESPIRATORY W GRAM STAIN

## 2017-07-02 LAB — HEMOGLOBIN A1C
Hgb A1c MFr Bld: 6.3 % — ABNORMAL HIGH (ref 4.8–5.6)
MEAN PLASMA GLUCOSE: 134.11 mg/dL

## 2017-07-02 LAB — BASIC METABOLIC PANEL
Anion gap: 13 (ref 5–15)
BUN: 26 mg/dL — AB (ref 6–20)
CHLORIDE: 104 mmol/L (ref 101–111)
CO2: 25 mmol/L (ref 22–32)
CREATININE: 0.98 mg/dL (ref 0.61–1.24)
Calcium: 8.6 mg/dL — ABNORMAL LOW (ref 8.9–10.3)
GFR calc Af Amer: >60 mL/min (ref >60)
GFR calc non Af Amer: >60 mL/min (ref >60)
GLUCOSE: 95 mg/dL (ref 65–99)
POTASSIUM: 4.2 mmol/L (ref 3.5–5.1)
SODIUM: 142 mmol/L (ref 135–145)

## 2017-07-02 LAB — MAGNESIUM: Magnesium: 2.3 mg/dL (ref 1.7–2.4)

## 2017-07-02 MED ORDER — LOSARTAN POTASSIUM 50 MG PO TABS
50.0000 mg | ORAL_TABLET | Freq: Every day | ORAL | Status: DC
  Administered 2017-07-03 – 2017-07-05 (×3): 50 mg via ORAL
  Filled 2017-07-02 (×3): qty 1

## 2017-07-02 MED ORDER — AMOXICILLIN-POT CLAVULANATE 875-125 MG PO TABS: 1.0000 | ORAL_TABLET | Freq: Two times a day (BID) | ORAL | Status: DC

## 2017-07-02 MED ORDER — AMOXICILLIN-POT CLAVULANATE 875-125 MG PO TABS
1.0000 | ORAL_TABLET | Freq: Two times a day (BID) | ORAL | Status: DC
  Administered 2017-07-02 – 2017-07-05 (×7): 1 via ORAL
  Filled 2017-07-02 (×7): qty 1

## 2017-07-02 MED ORDER — INSULIN ASPART 100 UNIT/ML ~~LOC~~ SOLN
0.0000 [IU] | Freq: Three times a day (TID) | SUBCUTANEOUS | Status: DC
  Administered 2017-07-04 – 2017-07-05 (×2): 2 [IU] via SUBCUTANEOUS
  Administered 2017-07-05: 3 [IU] via SUBCUTANEOUS

## 2017-07-02 MED ORDER — INSULIN ASPART 100 UNIT/ML ~~LOC~~ SOLN: 0.0000 [IU] | Freq: Every day | SUBCUTANEOUS | Status: DC

## 2017-07-02 MED ORDER — PANTOPRAZOLE SODIUM 40 MG PO TBEC
40.0000 mg | DELAYED_RELEASE_TABLET | Freq: Every day | ORAL | Status: DC
  Administered 2017-07-02 – 2017-07-04 (×3): 40 mg via ORAL
  Filled 2017-07-02 (×3): qty 1

## 2017-07-02 MED ORDER — CARVEDILOL 3.125 MG PO TABS
3.1250 mg | ORAL_TABLET | Freq: Two times a day (BID) | ORAL | Status: DC
  Administered 2017-07-02 – 2017-07-05 (×7): 3.125 mg via ORAL
  Filled 2017-07-02 (×7): qty 1

## 2017-07-02 NOTE — Progress Notes (Signed)
Inpatient Rehabilitation  Per PT request, patient was screened by Donyale Berthold for appropriateness for an Inpatient Acute Rehab consult.  At this time we are recommending an Inpatient Rehab consult.  Please order if you are agreeable.    Kyson Kupper, M.A., CCC/SLP Admission Coordinator  [facility]  Cell [phone]  

## 2017-07-02 NOTE — Plan of Care (Signed)
Problem: Activity: Goal: Ability to return to baseline activity level will improve Outcome: Progressing Ambulated with PT and recommending CIR eval

## 2017-07-02 NOTE — Evaluation (Signed)
Physical Therapy Evaluation Patient Details Name: Jeffery Thomas MRN: [id number] DOB: Mar 21, 1951 Today's Date: 07/02/2017   History of Present Illness  66 year old male presents to ED 8/20 after cardiac arrest (ACLS about 20 minutes) found to have inferior STEMI. Emergent Cath performed with normal coronary arteries and severely reduced EF of 25%. ETT 8/20, self-extubated 8/24. Pt with acute encephalopathy; concerns for anoxia.   Clinical Impression   Pt admitted with above diagnosis. Pt currently with functional limitations due to the deficits listed below (see PT Problem List). Completely independent prior to admission, avid golfer, retired from [employer] (23 years of service); Presents with decr coordination, decr balance reactions, high fall risk as well as cognitive deficits; Strongly recommend CIR for intensive rehab to address balance and cognition, and allow for safe dc home when able;  Pt will benefit from skilled PT to increase their independence and safety with mobility to allow discharge to the venue listed below.       Follow Up Recommendations CIR    Equipment Recommendations  Rolling walker with 5" wheels;3in1 (PT)    Recommendations for Other Services Rehab consult     Precautions / Restrictions Precautions Precautions: Fall      Mobility  Bed Mobility                  Transfers Overall transfer level: Needs assistance Equipment used: None Transfers: Sit to/from Stand Sit to Stand: Mod assist;Max assist         General transfer comment: Noting good strength to power up; once standing, however, very slow balance reactions (without hip or ankle strategies employed), and requiring at least Mod/Max assist to prevent fall; better with use of UE support on RW; required step-by-step cues to sit  Ambulation/Gait Ambulation/Gait assistance: +2 safety/equipment;Mod assist;Max assist Ambulation Distance (Feet):  (Hallway ambulation) Assistive device: Rolling  walker (2 wheeled) Gait Pattern/deviations: Step-through pattern;Narrow base of support;Trunk flexed Gait velocity: varried   General Gait Details: Used bilateral UE support on RW (despite Mr. Cavanagh' displeasure with the need for a RW); Tending to keep both hips slightly flexed throughout gait cycle; very narrow step width (like walking a line); noting at least 5 losses of balance requiring mon to mod assist to regain balance; at one point with loss of balance to the L including RW tipping as well -- max assist to prevent fall  Stairs            Wheelchair Mobility    Modified Rankin (Stroke Patients Only) Modified Rankin (Stroke Patients Only) Pre-Morbid Rankin Score: No symptoms Modified Rankin: Moderately severe disability     Balance Overall balance assessment: Needs assistance Sitting-balance support: Feet supported Sitting balance-Leahy Scale: Good       Standing balance-Leahy Scale: Poor Standing balance comment: very slow balance reactions; without ankle or hip strategies                             Pertinent Vitals/Pain Pain Assessment: No/denies pain    Home Living Family/patient expects to be discharged to:: Private residence Living Arrangements: Spouse/significant other Available Help at Discharge: Family;Other (Comment) (can arrange near 24 hour assist) Type of Home: House Home Access: Stairs to enter Entrance Stairs-Rails: None Entrance Stairs-Number of Steps: 2 (2 garage door; 1 steep step front door) Home Layout: Two level;Able to live on main level with bedroom/bathroom ("man cave" downstairs)        Prior Function Level of  Independence: Independent         Comments: Loves golf; retired from [employer]        Extremity/Trunk Assessment   Upper Extremity Assessment Upper Extremity Assessment: Overall WFL for tasks assessed    Lower Extremity Assessment Lower Extremity Assessment: RLE deficits/detail;LLE  deficits/detail RLE Deficits / Details: Strength WFL for simple mobility tasks; Noting significantly decr coordination, with decr balance reactions bilaterally LLE Deficits / Details: Strength WFL for simple mobility tasks; Noting significantly decr coordination, with decr balance reactions bilaterally       Communication   Communication: No difficulties  Cognition Arousal/Alertness: Awake/alert Behavior During Therapy: Impulsive Overall Cognitive Status: Impaired/Different from baseline Area of Impairment: Attention;Memory;Safety/judgement;Problem solving                   Current Attention Level: Sustained Memory: Decreased short-term memory   Safety/Judgement: Decreased awareness of safety;Decreased awareness of deficits   Problem Solving: Difficulty sequencing General Comments: Did not initially remeber [facility] trip he tood earlier this summer; After walk ( in which he hadgreater than 5 losses of balance, some requiring mod/max assist to prevent fall, when asked if walking was easy or hard, he stated easy, and that he had no difficulty      General Comments General comments (skin integrity, edema, etc.): Session conducted on Room Air; O2 sats greater than or equal to 93%    Exercises     Assessment/Plan    PT Assessment Patient needs continued PT services  PT Problem List Decreased activity tolerance;Decreased balance;Decreased mobility;Decreased coordination;Decreased cognition;Decreased knowledge of use of DME;Decreased safety awareness;Decreased knowledge of precautions       PT Treatment Interventions DME instruction;Gait training;Stair training;Functional mobility training;Therapeutic activities;Therapeutic exercise;Balance training;Neuromuscular re-education;Cognitive remediation;Patient/family education    PT Goals (Current goals can be found in the Care Plan section)  Acute Rehab PT Goals Patient Stated Goal: Hopes to get home soon PT Goal Formulation:  With patient/family Time For Goal Achievement: 07/16/17 Potential to Achieve Goals: Good    Frequency Min 3X/week   Barriers to discharge        Co-evaluation               AM-PAC PT "6 Clicks" Daily Activity  Outcome Measure Difficulty turning over in bed (including adjusting bedclothes, sheets and blankets)?: None Difficulty moving from lying on back to sitting on the side of the bed? : None Difficulty sitting down on and standing up from a chair with arms (e.g., wheelchair, bedside commode, etc,.)?: A Lot Help needed moving to and from a bed to chair (including a wheelchair)?: A Little Help needed walking in hospital room?: A Lot Help needed climbing 3-5 steps with a railing? : A Lot 6 Click Score: 17    End of Session Equipment Utilized During Treatment: Gait belt Activity Tolerance: Patient tolerated treatment well Patient left: in chair;with call bell/phone within reach;with family/visitor present Nurse Communication: Mobility status PT Visit Diagnosis: Unsteadiness on feet (R26.81);Other abnormalities of gait and mobility (R26.89);Difficulty in walking, not elsewhere classified (R26.2);Other symptoms and signs involving the nervous system (R29.898)    Time: 0949-1000 PT Time Calculation (min) (ACUTE ONLY): 11 min   Charges:   PT Evaluation $PT Eval Moderate Complexity: 1 Mod     PT G Codes:        Roney Marion, PT  Acute Rehabilitation Services Pager [phone] Office [phone]   [place] 07/02/2017, 11:28 AM

## 2017-07-02 NOTE — Progress Notes (Signed)
Progress Note  Patient Name: Jeffery Thomas Date of Encounter: 07/02/2017  Primary Cardiologist:   Dr. Gwenlyn Found  Subjective   Feels much better.  No chest pain.  No SOB.   Inpatient Medications    Scheduled Meds: . clonazePAM  0.5 mg Oral BID  . heparin subcutaneous  5,000 Units Subcutaneous Q8H  . insulin aspart  2-6 Units Subcutaneous Q4H  . insulin glargine  10 Units Subcutaneous Q24H  . losartan  25 mg Oral Daily  . metoprolol tartrate  12.5 mg Oral BID  . pantoprazole  40 mg Oral QHS   Continuous Infusions: . sodium chloride 250 mL (07/01/17 1900)  . piperacillin-tazobactam (ZOSYN)  IV Stopped (07/02/17 0930)   PRN Meds: sodium chloride, acetaminophen, fentaNYL (SUBLIMAZE) injection, hydrALAZINE, midazolam, ondansetron (ZOFRAN) IV   Vital Signs    Vitals:   07/02/17 0734 07/02/17 0800 07/02/17 0900 07/02/17 1000  BP:  (!) 147/83 (!) 147/85   Pulse:  68 67 73  Resp:  (!) 23 (!) 21   Temp: 99.4 F (37.4 C)     TempSrc: Oral     SpO2:  95% 93% 95%  Weight:      Height:        Intake/Output Summary (Last 24 hours) at 07/02/17 1056 Last data filed at 07/02/17 1008  Gross per 24 hour  Intake             1623 ml  Output             1435 ml  Net              188 ml   Filed Weights   06/27/17 0130 06/28/17 0352 06/29/17 0313  Weight: 208 lb 0.8 oz (94.4 kg) 208 lb 8.9 oz (94.6 kg) 216 lb 4.3 oz (98.1 kg)    Telemetry    NSR/SB - Personally Reviewed  ECG    NSR:  NA  Physical Exam   GEN: No  acute distress.   Neck: No  JVD Cardiac: RRR, no murmurs, rubs, or gallops.  Respiratory: Clear   to auscultation bilaterally. GI: Soft, nontender, non-distended, normal bowel sounds  MS:  No  edema; No deformity. Neuro:   Nonfocal  Psych: Oriented and appropriate   Labs    Chemistry Recent Labs Lab 06/26/17 2137  06/27/17 0146  06/30/17 1752 07/01/17 0243 07/02/17 0407  NA 137  < > 137  < > 142 141 142  K 3.4*  < > 3.3*  < > 3.7 4.3 4.2  CL 98*   < > 99*  < > 102 104 104  CO2 17*  --  24  < > 27 27 25   GLUCOSE 254*  < > 227*  < > 158* 137* 95  BUN 22*  < > 21*  < > 24* 29* 26*  CREATININE 1.60*  < > 0.96  < > 1.05 0.97 0.98  CALCIUM 8.0*  --  8.2*  < > 8.9 8.6* 8.6*  PROT 5.9*  --  7.0  --   --   --   --   ALBUMIN 3.6  --  4.2  --   --   --   --   AST 218*  --  202*  --   --   --   --   ALT 134*  --  173*  --   --   --   --   ALKPHOS 75  --  76  --   --   --   --  BILITOT 1.0  --  1.1  --   --   --   --   GFRNONAA 44*  --  >60  < > >60 >60 >60  GFRAA 51*  --  >60  < > >60 >60 >60  ANIONGAP 22*  --  14  < > 13 10 13   < > = values in this interval not displayed.   Hematology  Recent Labs Lab 06/30/17 0500 07/01/17 0243 07/02/17 0407  WBC 13.0* 11.9* 12.4*  RBC 4.56 4.46 4.87  HGB 13.3 13.2 13.7  HCT 39.6 38.6* 43.6  MCV 86.8 86.5 89.5  MCH 29.2 29.6 28.1  MCHC 33.6 34.2 31.4  RDW 13.5 13.3 13.9  PLT 178 166 177    Cardiac Enzymes  Recent Labs Lab 06/27/17 0146 06/27/17 0533 06/27/17 0843 06/27/17 1542  TROPONINI 2.03* 3.27* 3.19* 2.78*     Recent Labs Lab 06/26/17 2152  TROPIPOC 0.16*     BNPNo results for input(s): BNP, PROBNP in the last 168 hours.   DDimer No results for input(s): DDIMER in the last 168 hours.   Radiology    No results found.  Cardiac Studies    2-D echocardiogram 06/28/2017: Study Conclusions  - Left ventricle: The cavity size was normal. Wall thickness was increased in a pattern of mild LVH. Systolic function was moderately reduced. The estimated ejection fraction was in the range of 35% to 40%. Diffuse hypokinesis. Doppler parameters are consistent with abnormal left ventricular relaxation (grade 1 diastolic dysfunction). - Aortic valve: There was no stenosis. - Mitral valve: There was no significant regurgitation. - Right ventricle: The cavity size was normal. Systolic function was mildly reduced. - Pulmonary arteries: No complete TR doppler jet so  unable to estimate PA systolic pressure. - Systemic veins: The IVC measured 2.2 cm with < 50% respirophasic variation, suggesting RA pressure 15 mmHg.  Impressions:  - Normal LV size with mild LV hypertrophy. EF 35-40%, diffuse hypokinesis. Normal RV size with mildly decreased systolic function. No significant valvular abnormalities.  Patient Profile     66 y.o. male with history of HTN, HLD, GERD admitted following out of hospital cardiac arrest. He reported chest pain to his wife then collapsed in the other room. AED applied by EMS and pt was shocked. He then had PEA and then v. Fib with ROSC with 20 minutes of CPR. Emergent cardiac cath with normal coronary arteries. LVEF severely reduced at 25% on cath.  Assessment & Plan    RESPIRATORY:  Still being managed for pneumonia per CCM.  Final recs pending.   CARDIAC ARREST:  Much improved.  OK to progress to tele.  Continue with cardiac rehab.    NONISCHEMIC CARDIOMYOPATHY:  EP consult Monday. (They will need to be called.  Started Cozaar.  Changed to PO beta blocker.  I will change to Coreg.  Consider spironolactone in the AM.   DELIRIUM: Much improved.  Off Precidex.     Signed, Minus Breeding, MD  07/02/2017, 10:56 AM

## 2017-07-03 ENCOUNTER — Encounter (HOSPITAL_COMMUNITY): Payer: Self-pay | Admitting: Physician Assistant

## 2017-07-03 DIAGNOSIS — I10 Essential (primary) hypertension: Secondary | ICD-10-CM

## 2017-07-03 DIAGNOSIS — I428 Other cardiomyopathies: Secondary | ICD-10-CM

## 2017-07-03 DIAGNOSIS — G629 Polyneuropathy, unspecified: Secondary | ICD-10-CM

## 2017-07-03 DIAGNOSIS — R451 Restlessness and agitation: Secondary | ICD-10-CM

## 2017-07-03 DIAGNOSIS — D72829 Elevated white blood cell count, unspecified: Secondary | ICD-10-CM

## 2017-07-03 DIAGNOSIS — I5021 Acute systolic (congestive) heart failure: Secondary | ICD-10-CM

## 2017-07-03 DIAGNOSIS — E8809 Other disorders of plasma-protein metabolism, not elsewhere classified: Secondary | ICD-10-CM

## 2017-07-03 DIAGNOSIS — R7303 Prediabetes: Secondary | ICD-10-CM

## 2017-07-03 DIAGNOSIS — I5022 Chronic systolic (congestive) heart failure: Secondary | ICD-10-CM

## 2017-07-03 DIAGNOSIS — K219 Gastro-esophageal reflux disease without esophagitis: Secondary | ICD-10-CM

## 2017-07-03 LAB — CBC
HCT: 47.5 % (ref 39.0–52.0)
Hemoglobin: 15.4 g/dL (ref 13.0–17.0)
MCH: 28.6 pg (ref 26.0–34.0)
MCHC: 32.4 g/dL (ref 30.0–36.0)
MCV: 88.3 fL (ref 78.0–100.0)
PLATELETS: 225 10*3/uL (ref 150–400)
RBC: 5.38 MIL/uL (ref 4.22–5.81)
RDW: 13.7 % (ref 11.5–15.5)
WBC: 10.7 10*3/uL — AB (ref 4.0–10.5)

## 2017-07-03 LAB — PHOSPHORUS: PHOSPHORUS: 3.9 mg/dL (ref 2.5–4.6)

## 2017-07-03 LAB — BASIC METABOLIC PANEL
Anion gap: 12 (ref 5–15)
BUN: 18 mg/dL (ref 6–20)
CALCIUM: 8.9 mg/dL (ref 8.9–10.3)
CHLORIDE: 102 mmol/L (ref 101–111)
CO2: 26 mmol/L (ref 22–32)
CREATININE: 0.89 mg/dL (ref 0.61–1.24)
Glucose, Bld: 93 mg/dL (ref 65–99)
Potassium: 3.9 mmol/L (ref 3.5–5.1)
SODIUM: 140 mmol/L (ref 135–145)

## 2017-07-03 LAB — GLUCOSE, CAPILLARY
GLUCOSE-CAPILLARY: 114 mg/dL — AB (ref 65–99)
GLUCOSE-CAPILLARY: 142 mg/dL — AB (ref 65–99)
Glucose-Capillary: 97 mg/dL (ref 65–99)
Glucose-Capillary: 105 mg/dL — ABNORMAL HIGH (ref 65–99)

## 2017-07-03 LAB — MAGNESIUM: MAGNESIUM: 2.2 mg/dL (ref 1.7–2.4)

## 2017-07-03 MED ORDER — CHLORHEXIDINE GLUCONATE 4 % EX LIQD
CUTANEOUS | Status: AC
  Filled 2017-07-03: qty 15

## 2017-07-03 NOTE — Plan of Care (Signed)
Problem: Education: Goal: Knowledge of [facility] Education information/materials will improve Outcome: Progressing RN explained plan of care for this shift to patient and patient's significant other.  Patient's significant other stated understanding.

## 2017-07-03 NOTE — Progress Notes (Signed)
Occupational Therapy Evaluation Patient Details Name: Jeffery Thomas MRN: [id number] DOB: [birth year] Today's Date: 07/03/2017    History of Present Illness 66 year old male presents to ED 8/20 after cardiac arrest (ACLS about 20 minutes) found to have inferior STEMI. Emergent Cath performed with normal coronary arteries and severely reduced EF of 25%. ETT 8/20, self-extubated 8/24. Pt with acute encephalopathy; concerns for anoxia.    Clinical Impression   PTA, pt independent with ADL and mobility, drove, lived with his wife and enjoyed playing golf daily. Pt presents with significant functional change in status, requiring Mod A with mobility without use of AD and min A with ADL. Pt with significant cognitive deficits as listed below affecting his ADL and IADL level of independence. Pt will benefit from rehab at CIR to maximize functional level of independence to facilitate safe DC home. Excellent family support. Will follow acutely to address established goals and facilitate DC to next venue of care.     Follow Up Recommendations  CIR;Supervision/Assistance - 24 hour    Equipment Recommendations  3 in 1 bedside commode    Recommendations for Other Services Rehab consult     Precautions / Restrictions Precautions Precautions: Fall Restrictions Weight Bearing Restrictions: No      Mobility Bed Mobility               General bed mobility comments: OOB in chair  Transfers Overall transfer level: Needs assistance Equipment used: None Transfers: Sit to/from Stand Sit to Stand: Min assist         General transfer comment:  LOB in standing. Poor awareness of balance issues    Balance Overall balance assessment: Needs assistance Sitting-balance support: Feet supported Sitting balance-Leahy Scale: Good       Standing balance-Leahy Scale: Poor Standing balance comment: very slow balance reactions; reaching for support surfaces                            ADL either performed or assessed with clinical judgement   ADL Overall ADL's : Needs assistance/impaired     Grooming: Standing;Minimal assistance Grooming Details (indicate cue type and reason): a for balance Upper Body Bathing: Set up;Supervision/ safety;Sitting   Lower Body Bathing: Minimal assistance;Sit to/from stand   Upper Body Dressing : Set up;Supervision/safety;Sitting   Lower Body Dressing: Minimal assistance;Sit to/from stand   Toilet Transfer: Moderate assistance   Toileting- Clothing Manipulation and Hygiene: Minimal assistance;Sit to/from stand       Functional mobility during ADLs: Moderate assistance;Cueing for safety (without AD)       Vision Baseline Vision/History: Wears glasses Wears Glasses: Reading only Additional Comments: will further assess     Perception Perception Comments: will further assess   Praxis      Pertinent Vitals/Pain Pain Assessment: No/denies pain     Hand Dominance Right   Extremity/Trunk Assessment Upper Extremity Assessment Upper Extremity Assessment: Generalized weakness   Lower Extremity Assessment Lower Extremity Assessment: Defer to PT evaluation   Cervical / Trunk Assessment Cervical / Trunk Assessment: Normal   Communication Communication Communication: No difficulties   Cognition Arousal/Alertness: Awake/alert Behavior During Therapy: Impulsive Overall Cognitive Status: Impaired/Different from baseline Area of Impairment: Attention;Memory;Safety/judgement;Problem solving;Awareness                   Current Attention Level: Sustained Memory: Decreased short-term memory (poor STM. 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, is in the hospital because he had a small stroke and feels his only problems are with  balance; unable to tell me golf course where he lives and where he lived prior to moving to [place]; Poor immediate recall; unable to follow multistep commands; externally distracted during tasks   General Comments       Exercises     Shoulder Instructions      Home Living Family/patient expects to be discharged to:: Private residence Living Arrangements: Spouse/significant other Available Help at Discharge: Family;Other (Comment) (can arrange near 24 hour assist) Type of Home: House Home Access: Stairs to enter Entrance Stairs-Number of Steps: 2 (2 garage door; 1 steep step front door) Entrance Stairs-Rails: None Home Layout: Two level;Able to live on main level with bedroom/bathroom ("man cave" downstairs) Alternate Level Stairs-Number of Steps: flight Alternate Level Stairs-Rails: Right (descending) Bathroom Shower/Tub: Occupational psychologist: Standard Bathroom Accessibility: Yes How Accessible: Accessible via walker        Lives With: Spouse    Prior Functioning/Environment Level of Independence: Independent        Comments: Loves golf; retired from [employer] List: Decreased strength;Decreased activity tolerance;Impaired balance (sitting and/or standing);Decreased cognition;Decreased safety awareness;Cardiopulmonary status limiting activity;Decreased knowledge of use of DME or AE;Decreased knowledge of precautions      OT Treatment/Interventions: Self-care/ADL training;Therapeutic exercise;Energy conservation;DME and/or AE instruction;Therapeutic activities;Cognitive remediation/compensation;Patient/family education;Balance training    OT Goals(Current goals can be found in the care plan section) Acute Rehab OT Goals Patient Stated Goal: Hopes to get home soon OT Goal Formulation: With patient/family Time For Goal Achievement: 07/17/17 Potential to Achieve Goals: Good ADL Goals Pt Will Perform Lower Body Bathing: sit to/from stand;with  modified independence Pt Will Perform Lower Body Dressing: with modified independence;sit to/from stand Pt Will Transfer to Toilet: with modified independence;ambulating Additional ADL Goal #1: Pt wukk demonstrate anticipatory awareness during ADL task in nondistracting enviornment with min vc  OT Frequency: Min 2X/week   Barriers to D/C:            Co-evaluation              AM-PAC PT "6 Clicks" Daily Activity     Outcome Measure Help from another person eating meals?: None Help from another person taking care of personal grooming?: A Little Help from another person toileting, which includes using toliet, bedpan, or urinal?: A Little Help from another person bathing (including washing, rinsing, drying)?: A Little Help from another person to put on and taking off regular upper body clothing?: A Little Help from another person to put on and taking off regular lower body clothing?: A Little 6 Click Score: 19   End of Session Equipment Utilized During Treatment: Gait belt Nurse Communication: Mobility status  Activity Tolerance: Patient tolerated treatment well Patient left: in chair;with call bell/phone within reach;with chair alarm set;with family/visitor present  OT Visit Diagnosis: Unsteadiness on feet (R26.81);Muscle weakness (generalized) (M62.81);Other symptoms and signs involving cognitive function                Time: 5885-0277 OT Time Calculation (min): 29 min Charges:  OT General Charges $OT Visit: 1 Visit OT Evaluation $OT Eval Moderate Complexity: 1 Mod OT Treatments $Self Care/Home Management : 8-22 mins G-Codes:     [facility], OT/L  [phone] 07/03/2017  Liliani Bobo,HILLARY 07/03/2017, 12:17 PM

## 2017-07-03 NOTE — Progress Notes (Signed)
Physical Therapy Treatment Patient Details Name: Jeffery Thomas MRN: [id number] DOB: [birth year] Today's Date: 07/03/2017    History of Present Illness 66 year old male presents to ED 8/20 after cardiac arrest (ACLS about 20 minutes) found to have inferior STEMI. Emergent Cath performed with normal coronary arteries and severely reduced EF of 25%. ETT 8/20, self-extubated 8/24. Pt with acute encephalopathy; concerns for anoxia.     PT Comments    Pt progressing towards physical therapy goals. Continues to be confused with decreased safety awareness. Pt was able to ambulate with  Mod assist this session and chair follow. Pt eager to walk however seated rest break was necessary for LE recovery before attempting again. Will continue to follow.   Follow Up Recommendations  CIR     Equipment Recommendations  Rolling walker with 5" wheels;3in1 (PT)    Recommendations for Other Services Rehab consult     Precautions / Restrictions Precautions Precautions: Fall Restrictions Weight Bearing Restrictions: No    Mobility  Bed Mobility               General bed mobility comments: OOB in chair  Transfers Overall transfer level: Needs assistance Equipment used: None Transfers: Sit to/from Stand Sit to Stand: Min assist         General transfer comment:  LOB in standing. Poor awareness of balance issues  Ambulation/Gait Ambulation/Gait assistance: +2 safety/equipment;Mod assist Ambulation Distance (Feet): 200 Feet Assistive device: Rolling walker (2 wheeled) Gait Pattern/deviations: Step-through pattern;Narrow base of support;Trunk flexed Gait velocity: varied Gait velocity interpretation: Below normal speed for age/gender General Gait Details: Consistent mod assist required for frequent LOB. Pt scissoring at times - was able to make corrective changes for short bouts however not able to maintain. Therapist support for balance, walker management, and general safety.    Stairs            Wheelchair Mobility    Modified Rankin (Stroke Patients Only) Modified Rankin (Stroke Patients Only) Pre-Morbid Rankin Score: No symptoms Modified Rankin: Moderately severe disability     Balance Overall balance assessment: Needs assistance Sitting-balance support: Feet supported Sitting balance-Leahy Scale: Good       Standing balance-Leahy Scale: Poor Standing balance comment: very slow balance reactions; reaching for support surfaces                            Cognition Arousal/Alertness: Awake/alert Behavior During Therapy: Impulsive Overall Cognitive Status: Impaired/Different from baseline Area of Impairment: Attention;Memory;Safety/judgement;Problem solving;Awareness                   Current Attention Level: Sustained Memory: Decreased short-term memory (poor STM. 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, is in the hospital becasue he had a small stroke and feels his only problems are with balance; unable to tell me golf course where he lives and where he lived prior to moving to [facility];       Exercises      General Comments        Pertinent Vitals/Pain Pain Assessment: No/denies pain    Home Living Family/patient expects to be discharged to:: Private residence Living Arrangements: Spouse/significant other Available Help at Discharge: Family;Other (Comment) (can arrange near 24 hour assist) Type of Home: House Home Access: Stairs to enter Entrance Stairs-Rails: None Home Layout: Two level;Able to live on main  level with bedroom/bathroom ("man cave" downstairs)        Prior Function Level of Independence: Independent      Comments: Loves golf; retired from [employer] (current goals can now be found in the care  plan section) Acute Rehab PT Goals Patient Stated Goal: Hopes to get home soon PT Goal Formulation: With patient/family Time For Goal Achievement: 07/16/17 Potential to Achieve Goals: Good Progress towards PT goals: Progressing toward goals    Frequency    Min 3X/week      PT Plan Current plan remains appropriate    Co-evaluation              AM-PAC PT "6 Clicks" Daily Activity  Outcome Measure  Difficulty turning over in bed (including adjusting bedclothes, sheets and blankets)?: None Difficulty moving from lying on back to sitting on the side of the bed? : None Difficulty sitting down on and standing up from a chair with arms (e.g., wheelchair, bedside commode, etc,.)?: A Lot Help needed moving to and from a bed to chair (including a wheelchair)?: A Little Help needed walking in hospital room?: A Lot Help needed climbing 3-5 steps with a railing? : A Lot 6 Click Score: 17    End of Session Equipment Utilized During Treatment: Gait belt Activity Tolerance: Patient tolerated treatment well Patient left: in chair;with call bell/phone within reach;with family/visitor present Nurse Communication: Mobility status PT Visit Diagnosis: Unsteadiness on feet (R26.81);Other abnormalities of gait and mobility (R26.89);Difficulty in walking, not elsewhere classified (R26.2);Other symptoms and signs involving the nervous system (R29.898)     Time: 1610-9604 PT Time Calculation (min) (ACUTE ONLY): 31 min  Charges:  $Gait Training: 23-37 mins                    G Codes:       Rolinda Roan, PT, DPT Acute Rehabilitation Services Pager: [place] 07/03/2017, 1:25 PM

## 2017-07-03 NOTE — Consult Note (Signed)
Cardiology Consultation:   Patient ID: Jeffery Thomas; [id number]; [birth year]   Admit date: 06/26/2017 Date of Consult: 07/03/2017  Primary Care Provider: No primary care provider on file. Primary Cardiologist: new [place]   Patient Profile:   Jeffery Thomas is a 66 y.o. male with a hx of HTN, HLD, GERD who is being seen today for the evaluation of an ICD s/p cardiac arrest at the request of Hoch[place].  History of Present Illness:   Jeffery Thomas was admitted to [facility] 06/26/17.  Records reviewed note:  He was in his usual state of health until after dinner this evening.  He reported chest pain to his wife and went back to the bedroom.  She heard him fall, found him down and unresponsive.  EMS was summoned, AED applied and he was shocked.  He was then found to be in PEA and got epinephrine.  He went into vfib again and was shocked, then pulseless VT and was shocked again.  Finally attained perfusing rhythm with sinus tachy after about 20 minutes of CPR and treatment.   He underwent emergent cath with no CAD, LVEF 20%, was cooled. 06/28/17 LVEF by echo 35-40%.  06/28/17 rewarming underway, 06/30/17, self extubated, initially quite confused, yesterday delerium felt to be much improved, and transferred out of ICU.  Most recent labs K+ 4.2 BUN/Creat 26/0.98 WBC 12.4 H/H 13/43 plts 177  VSS, afebrile (Tmax 99.8 > 24 hrs ago)  The patient has no recollection of the events, his wife at bedside provides information.  She feels like his mental status is "150% better", he remains a little slow yet in comprehension, but much improved.  Remains physically weak, but this improving as well.  OOB today to chair with minimal support.  He has no syncope or near syncope history  Past Medical History:  Diagnosis Date  . Hypertension     Past Surgical History:  Procedure Laterality Date  . LEFT HEART CATH AND CORONARY ANGIOGRAPHY N/A 06/26/2017   Procedure: LEFT HEART CATH AND CORONARY ANGIOGRAPHY;   Surgeon: Lorretta Harp, MD;  Location: [place] [facility];  Service: Cardiovascular;  Laterality: N/A;     Home Medications:  Prior to Admission medications   Medication Sig Start Date End Date Taking? Authorizing Provider  amLODipine (NORVASC) 10 MG tablet Take 10 mg by mouth daily.   Yes [provider]  atorvastatin (LIPITOR) 40 MG tablet Take 40 mg by mouth daily.   Yes [provider]  hydrochlorothiazide (HYDRODIURIL) 25 MG tablet Take 25 mg by mouth daily.   Yes [provider]  hydrOXYzine (ATARAX/VISTARIL) 25 MG tablet Take 25 mg by mouth every 6 (six) hours as needed for itching.   Yes [provider]  levocetirizine (XYZAL) 5 MG tablet Take 5 mg by mouth every morning.    Yes [provider]  loperamide (IMODIUM) 2 MG capsule Take 2-4 mg by mouth daily as needed for diarrhea or loose stools.   Yes [provider]  metoprolol succinate (TOPROL-XL) 25 MG 24 hr tablet Take 50 mg by mouth daily.   Yes [provider]  montelukast (SINGULAIR) 10 MG tablet Take 10 mg by mouth every morning.    Yes [provider]  omeprazole (PRILOSEC) 20 MG capsule Take 40 mg by mouth daily.   Yes [provider]  PRESCRIPTION MEDICATION Steroid dose pack.   Yes [provider]  ranitidine (ZANTAC) 150 MG tablet Take 150 mg by mouth 2 (two) times  daily.   Yes [provider]    Inpatient Medications: Scheduled Meds: . amoxicillin-clavulanate  1 tablet Oral Q12H  . carvedilol  3.125 mg Oral BID WC  . clonazePAM  0.5 mg Oral BID  . heparin subcutaneous  5,000 Units Subcutaneous Q8H  . insulin aspart  0-15 Units Subcutaneous TID WC  . insulin aspart  0-5 Units Subcutaneous QHS  . losartan  50 mg Oral Daily  . pantoprazole  40 mg Oral QHS   Continuous Infusions: . sodium chloride 250 mL (07/01/17 1900)   PRN Meds: sodium chloride, acetaminophen, fentaNYL (SUBLIMAZE) injection, hydrALAZINE,  midazolam, ondansetron (ZOFRAN) IV  Allergies:   No Known Allergies  Social History:   Social History   Social History  . Marital status: Unknown    Spouse name: N/A  . Number of children: N/A  . Years of education: N/A   Occupational History  . Not on file.   Social History Main Topics  . Smoking status: Never Smoker  . Smokeless tobacco: Never Used  . Alcohol use Not on file  . Drug use: Unknown  . Sexual activity: Not on file   Other Topics Concern  . Not on file   Social History Narrative  . No narrative on file    Family History:    Family History  Problem Relation Age of Onset  . Hypertension Mother   . Diabetes Father   . Cancer Brother        pancreatic     ROS:  Please see the history of present illness.  ROS  All other ROS reviewed and negative.     Physical Exam/Data:   Vitals:   07/02/17 1948 07/02/17 2134 07/03/17 0549 07/03/17 0558  BP: (!) 149/96 140/79 (!) 152/93   Pulse:  (!) 59 60   Resp: 18 17 20    Temp: 98.4 F (36.9 C) 99.1 F (37.3 C) 98.1 F (36.7 C)   TempSrc: Oral  Oral   SpO2: 100% 96% 98%   Weight:    187 lb 4.8 oz (85 kg)  Height:        Intake/Output Summary (Last 24 hours) at 07/03/17 0903 Last data filed at 07/03/17 0600  Gross per 24 hour  Intake              942 ml  Output              775 ml  Net              167 ml   Filed Weights   06/28/17 0352 06/29/17 0313 07/03/17 0558  Weight: 208 lb 8.9 oz (94.6 kg) 216 lb 4.3 oz (98.1 kg) 187 lb 4.8 oz (85 kg)   Body mass index is 24.05 kg/m.  General:  Well nourished, well developed, in no acute distress HEENT: normal Lymph: no adenopathy Neck: no JVD Endocrine:  No thryomegaly Vascular: No carotid bruits; FA pulses 2+ bilaterally without bruits  Cardiac:  normal S1, S2; RRR; no murmurs, gallops or rubs Lungs:  clear to auscultation bilaterally, no wheezing, rhonchi or rales  Abd: soft, nontender, no hepatomegaly  Ext: no edema Musculoskeletal:  No  deformities, BUE and BLE strength normal and equal Skin: warm and dry  Neuro:  CNs 2-12 intact, no focal abnormalities noted Psych:  Normal affect   EKG:  The EKG was personally reviewed and demonstrates:   Initial EKG was SB 56bpm, PR 133ms, QRS 17ms, QTc 567ms Today is SB 54bpm, PR 180ms,  QRS 53ms, QTc 441ms (no historical EKGs) Telemetry:  Telemetry was personally reviewed and demonstrates:  SR, occasional junctional beats  Relevant CV Studies:  06/26/17: LHC IMPRESSION: Mr. Wadel has normal coronary arteries and severe LV dysfunction in a configuration consistent with "Takatsubo  Syndrome". His EF is approximately 20%. He was hemodynamically stable with no complex. He has had no arrhythmias. Potassium was 3.1 and he will be repleted. He will be systemically cooled . Pulmonary critical care medicine is aware. The sheath was removed and pressure held on the groin to achieve hemostasis. The patient left the lab in stable condition.  2-D echocardiogram 06/28/2017: Study Conclusions - Left ventricle: The cavity size was normal. Wall thickness was increased in a pattern of mild LVH. Systolic function was moderately reduced. The estimated ejection fraction was in the range of 35% to 40%. Diffuse hypokinesis. Doppler parameters are consistent with abnormal left ventricular relaxation (grade 1 diastolic dysfunction). - Aortic valve: There was no stenosis. - Mitral valve: There was no significant regurgitation. - Right ventricle: The cavity size was normal. Systolic function was mildly reduced. - Pulmonary arteries: No complete TR doppler jet so unable to estimate PA systolic pressure. - Systemic veins: The IVC measured 2.2 cm with < 50% respirophasic variation, suggesting RA pressure 15 mmHg. Impressions: - Normal LV size with mild LV hypertrophy. EF 35-40%, diffuse hypokinesis. Normal RV size with mildly decreased systolic function. No significant valvular  abnormalities.  Laboratory Data:  Chemistry  Recent Labs Lab 06/30/17 1752 07/01/17 0243 07/02/17 0407  NA 142 141 142  K 3.7 4.3 4.2  CL 102 104 104  CO2 27 27 25   GLUCOSE 158* 137* 95  BUN 24* 29* 26*  CREATININE 1.05 0.97 0.98  CALCIUM 8.9 8.6* 8.6*  GFRNONAA >60 >60 >60  GFRAA >60 >60 >60  ANIONGAP 13 10 13      Recent Labs Lab 06/26/17 2137 06/27/17 0146  PROT 5.9* 7.0  ALBUMIN 3.6 4.2  AST 218* 202*  ALT 134* 173*  ALKPHOS 75 76  BILITOT 1.0 1.1   Hematology  Recent Labs Lab 07/01/17 0243 07/02/17 0407 07/03/17 0705  WBC 11.9* 12.4* 10.7*  RBC 4.46 4.87 5.38  HGB 13.2 13.7 15.4  HCT 38.6* 43.6 47.5  MCV 86.5 89.5 88.3  MCH 29.6 28.1 28.6  MCHC 34.2 31.4 32.4  RDW 13.3 13.9 13.7  PLT 166 177 225   Cardiac Enzymes  Recent Labs Lab 06/26/17 2137 06/27/17 0146 06/27/17 0533 06/27/17 0843 06/27/17 1542  TROPONINI 0.13* 2.03* 3.27* 3.19* 2.78*     Recent Labs Lab 06/26/17 2152  TROPIPOC 0.16*     Radiology/Studies:   Dg Chest Port 1 View Result Date: 06/30/2017 CLINICAL DATA:  Extubation EXAM: PORTABLE CHEST 1 VIEW COMPARISON:  06/29/2017. FINDINGS: Interim extubation and removal of NG tube. Left IJ line stable position. Stable cardiomegaly. Improved aeration of both lungs. Partial clearing of bilateral pulmonary infiltrates/edema. No pleural effusion or pneumothorax . IMPRESSION: 1. Interim extubation and removal of NG tube. Left IJ line in stable position. 2. Improved aeration of both lungs. Partial clearing of bilateral pulmonary infiltrates/edema. 3. Stable cardiomegaly . Electronically Signed   By: Marcello Moores  Register   On: 06/30/2017 07:43    Assessment and Plan:   1. Witness arrest at home     Events as noted above     Unknown LVEF pre-event  Agree with ICD given cardiac arrest, secondary prevention, when closer to discharge timing On coreg, losartan EF 25% by cath on  arrival, 35-40% by echo  Patient and wife are made aware of [place]  state law, no driving for 6 months  06/27/17 blood cultures negative (x2), 5 days 06/29/17  Blood cultires (x2) neg 3 days  Klebsiella and staph in tracheal aspirate, s/p zosyn, currently on augmentin  Continue current management with primary team  Signed, Baldwin Jamaica, PA-C  07/03/2017 9:03 AM   EP Attending  Patient seen and examined. Agree with above. The patient has had a VF arrest. No clear cut secondary reversible causes, and his EF remains abnormal. He has no evidence of systemic infection. He remains somewhat encephalopathic. His exam reveals a 66 yo man, NAD, lungs were clear, and extremities demonstrate no edema. Tele with NSR.  A/P 1. VF arrest - will plan to proceed with ICD implant. Continue current meds. Our schedule tomorrow is full and he is still a bit encephalopathic. Will tentatively plan for ICD on Wednesday.  Mikle Bosworth.D.

## 2017-07-03 NOTE — Progress Notes (Signed)
Rehab admissions - I met with patient, his sister-in-law and his daughter at the bedside.  I gave them rehab booklets and discussed inpatient rehab program.  They would like inpatient rehab prior to home.  His wife works day shift.  His brother plans to come stay with patient while wife works.  I have opened the case with [employer] and am requesting acute inpatient rehab admission.  Noted patient scheduled for ICD placement tomorrow afternoon.  I will follow up once I hear back from insurance case manager.  Call me for questions.  #[phone]

## 2017-07-03 NOTE — Progress Notes (Signed)
Progress Note  Patient Name: Jeffery Thomas Date of Encounter: 07/03/2017  Primary Cardiologist: Gwenlyn Found  Subjective   Doing well this morning. Sitting up in the chair using his cell phone.   Inpatient Medications    Scheduled Meds: . amoxicillin-clavulanate  1 tablet Oral Q12H  . carvedilol  3.125 mg Oral BID WC  . clonazePAM  0.5 mg Oral BID  . heparin subcutaneous  5,000 Units Subcutaneous Q8H  . insulin aspart  0-15 Units Subcutaneous TID WC  . insulin aspart  0-5 Units Subcutaneous QHS  . losartan  50 mg Oral Daily  . pantoprazole  40 mg Oral QHS   Continuous Infusions: . sodium chloride 250 mL (07/01/17 1900)   PRN Meds: sodium chloride, acetaminophen, fentaNYL (SUBLIMAZE) injection, hydrALAZINE, midazolam, ondansetron (ZOFRAN) IV   Vital Signs    Vitals:   07/02/17 1948 07/02/17 2134 07/03/17 0549 07/03/17 0558  BP: (!) 149/96 140/79 (!) 152/93   Pulse:  (!) 59 60   Resp: 18 17 20    Temp: 98.4 F (36.9 C) 99.1 F (37.3 C) 98.1 F (36.7 C)   TempSrc: Oral  Oral   SpO2: 100% 96% 98%   Weight:    187 lb 4.8 oz (85 kg)  Height:        Intake/Output Summary (Last 24 hours) at 07/03/17 0904 Last data filed at 07/03/17 0600  Gross per 24 hour  Intake              942 ml  Output              775 ml  Net              167 ml   Filed Weights   06/28/17 0352 06/29/17 0313 07/03/17 0558  Weight: 208 lb 8.9 oz (94.6 kg) 216 lb 4.3 oz (98.1 kg) 187 lb 4.8 oz (85 kg)    Telemetry    SR - Personally Reviewed  ECG    SB - Personally Reviewed  Physical Exam   General: Well developed, well nourished, older W male appearing in no acute distress. Head: Normocephalic, atraumatic.  Neck: Supple without bruits, JVD. Lungs:  Resp regular and unlabored, CTA. Heart: RRR, S1, S2, no S3, S4, or murmur; no rub. Abdomen: Soft, non-tender, non-distended with normoactive bowel sounds. No hepatomegaly. No rebound/guarding. No obvious abdominal masses. Extremities: No  clubbing, cyanosis, edema. Distal pedal pulses are 2+ bilaterally. Neuro: Alert and oriented X 3. Moves all extremities spontaneously. Psych: Normal affect.  Labs    Chemistry Recent Labs Lab 06/26/17 2137  06/27/17 0146  06/30/17 1752 07/01/17 0243 07/02/17 0407  NA 137  < > 137  < > 142 141 142  K 3.4*  < > 3.3*  < > 3.7 4.3 4.2  CL 98*  < > 99*  < > 102 104 104  CO2 17*  --  24  < > 27 27 25   GLUCOSE 254*  < > 227*  < > 158* 137* 95  BUN 22*  < > 21*  < > 24* 29* 26*  CREATININE 1.60*  < > 0.96  < > 1.05 0.97 0.98  CALCIUM 8.0*  --  8.2*  < > 8.9 8.6* 8.6*  PROT 5.9*  --  7.0  --   --   --   --   ALBUMIN 3.6  --  4.2  --   --   --   --   AST 218*  --  202*  --   --   --   --   ALT 134*  --  173*  --   --   --   --   ALKPHOS 75  --  76  --   --   --   --   BILITOT 1.0  --  1.1  --   --   --   --   GFRNONAA 44*  --  >60  < > >60 >60 >60  GFRAA 51*  --  >60  < > >60 >60 >60  ANIONGAP 22*  --  14  < > 13 10 13   < > = values in this interval not displayed.   Hematology Recent Labs Lab 07/01/17 0243 07/02/17 0407 07/03/17 0705  WBC 11.9* 12.4* 10.7*  RBC 4.46 4.87 5.38  HGB 13.2 13.7 15.4  HCT 38.6* 43.6 47.5  MCV 86.5 89.5 88.3  MCH 29.6 28.1 28.6  MCHC 34.2 31.4 32.4  RDW 13.3 13.9 13.7  PLT 166 177 225    Cardiac Enzymes Recent Labs Lab 06/27/17 0146 06/27/17 0533 06/27/17 0843 06/27/17 1542  TROPONINI 2.03* 3.27* 3.19* 2.78*    Recent Labs Lab 06/26/17 2152  TROPIPOC 0.16*     BNPNo results for input(s): BNP, PROBNP in the last 168 hours.   DDimer No results for input(s): DDIMER in the last 168 hours.    Radiology    No results found.  Cardiac Studies   TTE: 06/28/17  Study Conclusions  - Left ventricle: The cavity size was normal. Wall thickness was   increased in a pattern of mild LVH. Systolic function was   moderately reduced. The estimated ejection fraction was in the   range of 35% to 40%. Diffuse hypokinesis. Doppler  parameters are   consistent with abnormal left ventricular relaxation (grade 1   diastolic dysfunction). - Aortic valve: There was no stenosis. - Mitral valve: There was no significant regurgitation. - Right ventricle: The cavity size was normal. Systolic function   was mildly reduced. - Pulmonary arteries: No complete TR doppler jet so unable to   estimate PA systolic pressure. - Systemic veins: The IVC measured 2.2 cm with < 50% respirophasic   variation, suggesting RA pressure 15 mmHg.  Impressions:  - Normal LV size with mild LV hypertrophy. EF 35-40%, diffuse   hypokinesis. Normal RV size with mildly decreased systolic   function. No significant valvular abnormalities.  Patient Profile     66 y.o. male with history of HTN, HLD, GERD admitted following out of hospital cardiac arrest. He reported chest pain to his wife then collapsed in the other room. AED applied by EMS and pt was shocked. He then had PEA and then v. Fib with ROSC with 20 minutes of CPR. Emergent cardiac cath with normal coronary arteries. LVEF severely reduced at 25% on cath.  Assessment & Plan    1. Cardiac arrest: No CAD on cath, but severely reduced EF 25% on Cath. Follow up echo showed EF of 35-40% with diffuse hypokinesis.  -- continue on ARB, Coreg  -- planned for EP consult today regarding ICD  2. PNA: treated by PCCM. Planned to continue Augmentin until 07/06/17.   3. Delirium: Much improved.   4. Deconditioning: Was seen by inpatient rehab admission for consideration. Recommending inpatient rehab at this time. Will place order for official consult.   Signed, Reino Bellis, NP  07/03/2017, 9:04 AM  Pager # [phone]   Patient seen and examined. Agree with  assessment and plan. No chest pain. Hospital records reviewed since his presentation.  Pt had attended the [facility] tournament and walked significantly the days prior to his arrest.  Had been active and played gold at least 4 days/week.  No CAD at  cath but a Takotsubo cardiomyopathy LV gram pattern.  Initial EF 20 - 25%, improved several days later at echo.  Will titrate meds as BP allows to optimize potential for recovery o f LV fxn.  For ICD tomorrow. Currently on Augmentin for aspiration PNA. Will add spironolactone 12.5 mg daily today.   Troy Sine, MD, [facility] 07/03/2017 10:01 AM

## 2017-07-03 NOTE — Discharge Summary (Deleted)
Physical Medicine and Rehabilitation Consult  Reason for Consult: Debility due to cardiac arrest with anoxic encephalopathy Referring Physician: Dr. Claiborne Billings   HPI: Jeffery Thomas is a 66 y.o. male with history of HTN, GERD who was admitted on 06/26/17 with PEA cardiac arrest due to V fib/V tach and treated with CPR/ACLS protocol  X 20 minutes with ROSC.  History taken from chart review and family. EKG with inferior STEMI and he was treated with [place] protocol.  Cardiac cath negative for CAD with LVEF < 20% felt to be due to [facility] CM.  CT head reviewed, negative for acute changes and CT cervical spine with multilevel degenerative changes. EEG with diffuse cerebral dysfunction with additional occasional  focal slowing over right temporal region due to anoxic, ischemic, toxic/metabolic or medication effect. Repeat echo 8/22 revealed normal LV with mild LV hypertrophy and EF 35-40% with diffuse hypokinesis.  HCAP due to Kleb Pneumoniae and Staph aureus treated with zosyn--->augmentin.  Patient self extubated on 8/24 and has had issues with agitation due to delirium. EP consulted for input on ICD placement. Therapy evaluations revealed cognitive deficits as well as deficits in mobility therefore CIR recommended for follow up therapy.    Review of Systems  HENT: Positive for sore throat. Negative for hearing loss and tinnitus.   Eyes: Negative for blurred vision and double vision.  Respiratory: Negative for cough, shortness of breath and stridor.   Cardiovascular: Negative for chest pain and palpitations.  Gastrointestinal: Negative for constipation, heartburn and nausea.  Genitourinary: Negative for urgency.  Musculoskeletal: Negative for back pain and myalgias.  Skin: Negative for rash.  Neurological: Positive for speech change. Negative for dizziness and headaches.  Psychiatric/Behavioral: Positive for memory loss.  All other systems reviewed and are negative.     Past Medical  History:  Diagnosis Date  . Hypertension     Past Surgical History:  Procedure Laterality Date  . LEFT HEART CATH AND CORONARY ANGIOGRAPHY N/A 06/26/2017   Procedure: LEFT HEART CATH AND CORONARY ANGIOGRAPHY;  Surgeon: Lorretta Harp, MD;  Location: [facility];  Service: Cardiovascular;  Laterality: N/A;    Family History  Problem Relation Age of Onset  . Hypertension Mother   . Diabetes Father   . Cancer Brother        pancreatic    Social History:  Married. Retired at age 87. He smokes 2 cigars about 6 days a week while golfing. He has never used smokeless tobacco. He drinks a mixed drink and glass of alcohol at nights and beer occasionally. His drug history is not on file.    Allergies: No Known Allergies    Medications Prior to Admission  Medication Sig Dispense Refill  . amLODipine (NORVASC) 10 MG tablet Take 10 mg by mouth daily.    Marland Kitchen atorvastatin (LIPITOR) 40 MG tablet Take 40 mg by mouth daily.    . hydrochlorothiazide (HYDRODIURIL) 25 MG tablet Take 25 mg by mouth daily.    . hydrOXYzine (ATARAX/VISTARIL) 25 MG tablet Take 25 mg by mouth every 6 (six) hours as needed for itching.    . levocetirizine (XYZAL) 5 MG tablet Take 5 mg by mouth every morning.     . loperamide (IMODIUM) 2 MG capsule Take 2-4 mg by mouth daily as needed for diarrhea or loose stools.    . metoprolol succinate (TOPROL-XL) 25 MG 24 hr tablet Take 50 mg by mouth daily.    . montelukast (SINGULAIR) 10  MG tablet Take 10 mg by mouth every morning.     Marland Kitchen omeprazole (PRILOSEC) 20 MG capsule Take 40 mg by mouth daily.    Marland Kitchen PRESCRIPTION MEDICATION Steroid dose pack.    . ranitidine (ZANTAC) 150 MG tablet Take 150 mg by mouth 2 (two) times daily.      Home: Home Living Family/patient expects to be discharged to:: Private residence Living Arrangements: Spouse/significant other Available Help at Discharge: Family, Other (Comment) (can arrange near 24 hour assist) Type of Home: House Home  Access: Stairs to enter [employer] of Steps: 2 (2 garage door; 1 steep step front door) Entrance Stairs-Rails: None Home Layout: Two level, Able to live on main level with bedroom/bathroom ("man cave" downstairs) Alternate Level Stairs-Number of Steps: flight Alternate Level Stairs-Rails: Right (descending) Bathroom Shower/Tub: Walk-in shower  Lives With: Spouse  Functional History: Prior Function Level of Independence: Independent Comments: Futures trader; retired from [employer]:  Mobility:   Transfers Overall transfer level: Needs assistance Equipment used: None Transfers: Sit to/from [place] to Stand: Mod assist, Max assist General transfer comment: Noting good strength to power up; once standing, however, very slow balance reactions (without hip or ankle strategies employed), and requiring at least Mod/Max assist to prevent fall; better with use of UE support on RW; required step-by-step cues to sit Ambulation/Gait Ambulation/Gait assistance: +2 safety/equipment, Mod assist, Max assist Ambulation Distance (Feet):  (Hallway ambulation) Assistive device: Rolling walker (2 wheeled) Gait Pattern/deviations: Step-through pattern, Narrow base of support, Trunk flexed General Gait Details: Used bilateral UE support on RW (despite Mr. Sacks' displeasure with the need for a RW); Tending to keep both hips slightly flexed throughout gait cycle; very narrow step width (like walking a line); noting at least 5 losses of balance requiring mon to mod assist to regain balance; at one point with loss of balance to the L including RW tipping as well -- max assist to prevent fall Gait velocity: varried    ADL:    Cognition: Cognition Overall Cognitive Status: Impaired/Different from baseline Arousal/Alertness: Awake/alert Orientation Level: Oriented to person, Oriented to place, Oriented to time, Disoriented to situation Attention: Focused Focused Attention:  Impaired Focused Attention Impairment: Verbal basic, Functional basic Memory: Impaired Memory Impairment: Storage deficit Awareness: Impaired Awareness Impairment: Intellectual impairment Problem Solving: Impaired Problem Solving Impairment: Verbal basic Behaviors: Restless, Impulsive, Perseveration, Poor frustration tolerance Safety/Judgment: Impaired Cognition Arousal/Alertness: Awake/alert Behavior During Therapy: Impulsive Overall Cognitive Status: Impaired/Different from baseline Area of Impairment: Attention, Memory, Safety/judgement, Problem solving Current Attention Level: Sustained Memory: Decreased short-term memory Safety/Judgement: Decreased awareness of safety, Decreased awareness of deficits Problem Solving: Difficulty sequencing General Comments: Did not initially remeber [facility] trip he tood earlier this summer; After walk ( in which he hadgreater than 5 losses of balance, some requiring mod/max assist to prevent fall, when asked if walking was easy or hard, he stated easy, and that he had no difficulty  Blood pressure (!) 152/93, pulse 60, temperature 98.1 F (36.7 C), temperature source Oral, resp. rate 20, height 6\' 2"  (1.88 m), weight 85 kg (187 lb 4.8 oz), SpO2 98 %. Physical Exam  Nursing note and vitals reviewed. Constitutional: He appears well-developed and well-nourished. No distress.  HENT:  Head: Normocephalic and atraumatic.  Mouth/Throat: Oropharynx is clear and moist.  Eyes: Pupils are equal, round, and reactive to light. Conjunctivae and EOM are normal.  Neck: Normal range of motion. Neck supple.  Cardiovascular: Normal rate and regular rhythm.   Respiratory: Effort normal  and breath sounds normal. No stridor. No respiratory distress. He has no wheezes.  GI: Soft. Bowel sounds are normal. He exhibits no distension. There is no tenderness.  Musculoskeletal: He exhibits no edema or tenderness.  Neurological: He is alert.  Slow to process needing  redirection.  Oriented to self. Needed cues to recall age and DOB.  He is able to follow simple one and two step motor commands.  Motor: 4+/5 throughout Sensation intact to light touch  Skin: Skin is warm and dry. He is not diaphoretic. No erythema.  Psychiatric: His affect is blunt. His speech is delayed. He is slowed. Cognition and memory are impaired. He expresses inappropriate judgment.    Results for orders placed or performed during the hospital encounter of 06/26/17 (from the past 24 hour(s))  Glucose, capillary     Status: Abnormal   Collection Time: 07/02/17 12:15 PM  Result Value Ref Range   Glucose-Capillary 121 (H) 65 - 99 mg/dL  Glucose, capillary     Status: Abnormal   Collection Time: 07/02/17  5:39 PM  Result Value Ref Range   Glucose-Capillary 109 (H) 65 - 99 mg/dL  Hemoglobin A1c     Status: Abnormal   Collection Time: 07/02/17  6:14 PM  Result Value Ref Range   Hgb A1c MFr Bld 6.3 (H) 4.8 - 5.6 %   Mean Plasma Glucose 134.11 mg/dL  Glucose, capillary     Status: Abnormal   Collection Time: 07/02/17  9:40 PM  Result Value Ref Range   Glucose-Capillary 103 (H) 65 - 99 mg/dL  Magnesium     Status: None   Collection Time: 07/03/17  7:05 AM  Result Value Ref Range   Magnesium 2.2 1.7 - 2.4 mg/dL  Phosphorus     Status: None   Collection Time: 07/03/17  7:05 AM  Result Value Ref Range   Phosphorus 3.9 2.5 - 4.6 mg/dL  Basic metabolic panel     Status: None   Collection Time: 07/03/17  7:05 AM  Result Value Ref Range   Sodium 140 135 - 145 mmol/L   Potassium 3.9 3.5 - 5.1 mmol/L   Chloride 102 101 - 111 mmol/L   CO2 26 22 - 32 mmol/L   Glucose, Bld 93 65 - 99 mg/dL   BUN 18 6 - 20 mg/dL   Creatinine, Ser 0.89 0.61 - 1.24 mg/dL   Calcium 8.9 8.9 - 10.3 mg/dL   GFR calc non Af Amer >60 >60 mL/min   GFR calc Af Amer >60 >60 mL/min   Anion gap 12 5 - 15  CBC     Status: Abnormal   Collection Time: 07/03/17  7:05 AM  Result Value Ref Range   WBC 10.7 (H) 4.0  - 10.5 K/uL   RBC 5.38 4.22 - 5.81 MIL/uL   Hemoglobin 15.4 13.0 - 17.0 g/dL   HCT 47.5 39.0 - 52.0 %   MCV 88.3 78.0 - 100.0 fL   MCH 28.6 26.0 - 34.0 pg   MCHC 32.4 30.0 - 36.0 g/dL   RDW 13.7 11.5 - 15.5 %   Platelets 225 150 - 400 K/uL  Glucose, capillary     Status: None   Collection Time: 07/03/17  7:16 AM  Result Value Ref Range   Glucose-Capillary 97 65 - 99 mg/dL   No results found.  Assessment/Plan: Diagnosis: Debility  Labs and images independently reviewed.  Records reviewed and summated above.  1. Does the need for close, 24 hr/day medical supervision  in concert with the patient's rehab needs make it unreasonable for this patient to be served in a less intensive setting? Yes 2. Co-Morbidities requiring supervision/potential complications: HTN (monitor and provide prns in accordance with increased physical exertion and pain), GERD (cont meds), STEMI (meds), HCAP (abx), systolic CHF (Monitor in accordance with increased physical activity and avoid UE resistance excercises), [place]ubo CM (recs per Cards), leukocytosis (cont to monitor for signs and symptoms of infection, further workup if indicated), prediabetes (Monitor in accordance with exercise and adjust meds as necessary) 3. Due to safety, disease management, medication administration and patient education, does the patient require 24 hr/day rehab nursing? Yes 4. Does the patient require coordinated care of a physician, rehab nurse, PT (1-2 hrs/day, 5 days/week), OT (1-2 hrs/day, 5 days/week) and SLP (1-2 hrs/day, 5 days/week) to address physical and functional deficits in the context of the above medical diagnosis(es)? Yes Addressing deficits in the following areas: balance, endurance, locomotion, transferring, bathing, dressing, toileting, cognition and psychosocial support 5. Can the patient actively participate in an intensive therapy program of at least 3 hrs of therapy per day at least 5 days per week?  Potentially 6. The potential for patient to make measurable gains while on inpatient rehab is excellent 7. Anticipated functional outcomes upon discharge from inpatient rehab are supervision and min assist  with PT, supervision and min assist with OT, supervision and min assist with SLP. 8. Estimated rehab length of stay to reach the above functional goals is: 14-18 days. 9. Anticipated D/C setting: Home 10. Anticipated post D/C treatments: HH therapy and Home excercise program 11. Overall Rehab/Functional Prognosis: good  RECOMMENDATIONS: This patient's condition is appropriate for continued rehabilitative care in the following setting: CIR after completion of medical workup. Patient has agreed to participate in recommended program. Potentially Note that insurance prior authorization may be required for reimbursement for recommended care.  Comment: Rehab Admissions Coordinator to follow up.  Delice Lesch, MD, [address], [place] 07/03/2017

## 2017-07-04 ENCOUNTER — Inpatient Hospital Stay (HOSPITAL_COMMUNITY)
Admission: EM | Disposition: A | Discharge status: Rehabilitation Facility | Payer: Self-pay | Source: Home / Self Care | Attending: Cardiovascular Disease

## 2017-07-04 DIAGNOSIS — Z95810 Presence of automatic (implantable) cardiac defibrillator: Secondary | ICD-10-CM

## 2017-07-04 DIAGNOSIS — E8809 Other disorders of plasma-protein metabolism, not elsewhere classified: Secondary | ICD-10-CM

## 2017-07-04 DIAGNOSIS — J189 Pneumonia, unspecified organism: Secondary | ICD-10-CM

## 2017-07-04 HISTORY — PX: ICD IMPLANT: EP1208

## 2017-07-04 HISTORY — DX: Presence of automatic (implantable) cardiac defibrillator: Z95.810

## 2017-07-04 LAB — CULTURE, BLOOD (ROUTINE X 2)
Culture: NO GROWTH
Culture: NO GROWTH
SPECIAL REQUESTS: ADEQUATE
Special Requests: ADEQUATE

## 2017-07-04 LAB — BASIC METABOLIC PANEL WITH GFR
Anion gap: 8 (ref 5–15)
BUN: 19 mg/dL (ref 6–20)
CO2: 25 mmol/L (ref 22–32)
Calcium: 8.5 mg/dL — ABNORMAL LOW (ref 8.9–10.3)
Chloride: 105 mmol/L (ref 101–111)
Creatinine, Ser: 0.86 mg/dL (ref 0.61–1.24)
GFR calc Af Amer: >60 mL/min
GFR calc non Af Amer: >60 mL/min
Glucose, Bld: 131 mg/dL — ABNORMAL HIGH (ref 65–99)
Potassium: 3.8 mmol/L (ref 3.5–5.1)
Sodium: 138 mmol/L (ref 135–145)

## 2017-07-04 LAB — GLUCOSE, CAPILLARY
Glucose-Capillary: 111 mg/dL — ABNORMAL HIGH (ref 65–99)
Glucose-Capillary: 144 mg/dL — ABNORMAL HIGH (ref 65–99)
Glucose-Capillary: 127 mg/dL — ABNORMAL HIGH (ref 65–99)
Glucose-Capillary: 157 mg/dL — ABNORMAL HIGH (ref 65–99)

## 2017-07-04 LAB — CBC
HCT: 44.3 % (ref 39.0–52.0)
Hemoglobin: 14.8 g/dL (ref 13.0–17.0)
MCH: 29.1 pg (ref 26.0–34.0)
MCHC: 33.4 g/dL (ref 30.0–36.0)
MCV: 87 fL (ref 78.0–100.0)
Platelets: 244 K/uL (ref 150–400)
RBC: 5.09 MIL/uL (ref 4.22–5.81)
RDW: 13.5 % (ref 11.5–15.5)
WBC: 11.2 K/uL — ABNORMAL HIGH (ref 4.0–10.5)

## 2017-07-04 LAB — MAGNESIUM: MAGNESIUM: 2.1 mg/dL (ref 1.7–2.4)

## 2017-07-04 LAB — PHOSPHORUS: Phosphorus: 3.5 mg/dL (ref 2.5–4.6)

## 2017-07-04 SURGERY — ICD IMPLANT

## 2017-07-04 MED ORDER — MENTHOL 3 MG MT LOZG
1.0000 | LOZENGE | OROMUCOSAL | Status: DC | PRN
  Administered 2017-07-04 – 2017-07-05 (×2): 3 mg via ORAL
  Filled 2017-07-04 (×4): qty 9

## 2017-07-04 MED ORDER — CHLORHEXIDINE GLUCONATE 4 % EX LIQD
60.0000 mL | Freq: Once | CUTANEOUS | Status: AC
  Filled 2017-07-04: qty 60

## 2017-07-04 MED ORDER — MIDAZOLAM HCL 5 MG/5ML IJ SOLN
INTRAMUSCULAR | Status: DC | PRN
  Administered 2017-07-04: 2 mg via INTRAVENOUS
  Administered 2017-07-04 (×2): 1 mg via INTRAVENOUS

## 2017-07-04 MED ORDER — ONDANSETRON HCL 4 MG/2ML IJ SOLN: 4.0000 mg | Freq: Four times a day (QID) | INTRAMUSCULAR | Status: DC | PRN

## 2017-07-04 MED ORDER — SODIUM CHLORIDE 0.9 % IV SOLN
250.0000 mL | INTRAVENOUS | Status: DC | PRN
Start: 2017-07-04 — End: 2017-07-05

## 2017-07-04 MED ORDER — IOPAMIDOL (ISOVUE-370) INJECTION 76%
INTRAVENOUS | Status: AC
  Filled 2017-07-04: qty 50

## 2017-07-04 MED ORDER — ACETAMINOPHEN 325 MG PO TABS: 325.0000 mg | ORAL_TABLET | ORAL | Status: DC | PRN

## 2017-07-04 MED ORDER — CEFAZOLIN SODIUM-DEXTROSE 2-4 GM/100ML-% IV SOLN
INTRAVENOUS | Status: AC
Start: 2017-07-04 — End: 2017-07-04
  Filled 2017-07-04: qty 100

## 2017-07-04 MED ORDER — SODIUM CHLORIDE 0.9% FLUSH
3.0000 mL | Freq: Two times a day (BID) | INTRAVENOUS | Status: DC
  Administered 2017-07-04 – 2017-07-05 (×2): 3 mL via INTRAVENOUS

## 2017-07-04 MED ORDER — SODIUM CHLORIDE 0.9% FLUSH
3.0000 mL | Freq: Two times a day (BID) | INTRAVENOUS | Status: DC
  Administered 2017-07-04: 3 mL via INTRAVENOUS

## 2017-07-04 MED ORDER — LIDOCAINE HCL (PF) 1 % IJ SOLN
INTRAMUSCULAR | Status: AC
Start: 2017-07-04 — End: 2017-07-04
  Filled 2017-07-04: qty 60

## 2017-07-04 MED ORDER — SODIUM CHLORIDE 0.9% FLUSH: 3.0000 mL | INTRAVENOUS | Status: DC | PRN

## 2017-07-04 MED ORDER — CHLORHEXIDINE GLUCONATE 4 % EX LIQD
60.0000 mL | Freq: Once | CUTANEOUS | Status: AC
  Administered 2017-07-04: 4 via TOPICAL

## 2017-07-04 MED ORDER — FENTANYL CITRATE (PF) 100 MCG/2ML IJ SOLN
INTRAMUSCULAR | Status: AC
  Filled 2017-07-04: qty 2

## 2017-07-04 MED ORDER — LIDOCAINE HCL (PF) 1 % IJ SOLN
INTRAMUSCULAR | Status: DC | PRN
  Administered 2017-07-04: 45 mL

## 2017-07-04 MED ORDER — SODIUM CHLORIDE 0.9 % IV SOLN
INTRAVENOUS | Status: DC
  Administered 2017-07-04: 13:00:00 via INTRAVENOUS

## 2017-07-04 MED ORDER — HYDROCODONE-ACETAMINOPHEN 5-325 MG PO TABS: 1.0000 | ORAL_TABLET | ORAL | Status: DC | PRN

## 2017-07-04 MED ORDER — FENTANYL CITRATE (PF) 100 MCG/2ML IJ SOLN
INTRAMUSCULAR | Status: DC | PRN
  Administered 2017-07-04: 50 ug via INTRAVENOUS

## 2017-07-04 MED ORDER — CEFAZOLIN SODIUM-DEXTROSE 1-4 GM/50ML-% IV SOLN
1.0000 g | Freq: Four times a day (QID) | INTRAVENOUS | Status: AC
  Administered 2017-07-05 (×3): 1 g via INTRAVENOUS
  Filled 2017-07-04 (×3): qty 50

## 2017-07-04 MED ORDER — ENSURE ENLIVE PO LIQD
237.0000 mL | Freq: Three times a day (TID) | ORAL | Status: DC
  Administered 2017-07-04 – 2017-07-05 (×3): 237 mL via ORAL

## 2017-07-04 MED ORDER — SODIUM CHLORIDE 0.9 % IR SOLN
Status: AC
  Filled 2017-07-04: qty 2

## 2017-07-04 MED ORDER — SODIUM CHLORIDE 0.9 % IR SOLN
80.0000 mg | Status: AC
  Administered 2017-07-04: 80 mg

## 2017-07-04 MED ORDER — SODIUM CHLORIDE 0.9 % IV SOLN: 250.0000 mL | INTRAVENOUS | Status: DC

## 2017-07-04 MED ORDER — HEPARIN (PORCINE) IN NACL 2-0.9 UNIT/ML-% IJ SOLN
INTRAMUSCULAR | Status: AC | PRN
  Administered 2017-07-04: 500 mL

## 2017-07-04 MED ORDER — SPIRONOLACTONE 25 MG PO TABS
12.5000 mg | ORAL_TABLET | Freq: Two times a day (BID) | ORAL | Status: DC
  Administered 2017-07-04 – 2017-07-05 (×3): 12.5 mg via ORAL
  Filled 2017-07-04 (×3): qty 1

## 2017-07-04 MED ORDER — CEFAZOLIN SODIUM-DEXTROSE 2-4 GM/100ML-% IV SOLN
2.0000 g | INTRAVENOUS | Status: AC
  Administered 2017-07-04: 2 g via INTRAVENOUS

## 2017-07-04 MED ORDER — MIDAZOLAM HCL 5 MG/5ML IJ SOLN
INTRAMUSCULAR | Status: AC
  Filled 2017-07-04: qty 5

## 2017-07-04 MED ORDER — IOPAMIDOL (ISOVUE-370) INJECTION 76%
INTRAVENOUS | Status: DC | PRN
  Administered 2017-07-04: 15 mL via INTRAVENOUS

## 2017-07-04 SURGICAL SUPPLY — 6 items
CABLE SURGICAL S-101-97-12 (CABLE) ×2 IMPLANT
ICD FORTIFY ASSU VR CD1357-40Q (ICD Generator) ×2 IMPLANT
LEAD DURATA 7122Q-65CM (Lead) ×2 IMPLANT
PAD DEFIB LIFELINK (PAD) ×2 IMPLANT
SHEATH CLASSIC 7F (SHEATH) ×2 IMPLANT
TRAY PACEMAKER INSERTION (PACKS) ×2 IMPLANT

## 2017-07-04 NOTE — Interval H&P Note (Signed)
History and Physical Interval Note:  07/04/2017 4:57 PM  Jeffery Thomas  has presented today for surgery, with the diagnosis of cardiac arrest  The various methods of treatment have been discussed with the patient and family. After consideration of risks, benefits and other options for treatment, the patient has consented to  Procedure(s): ICD Implant (N/A) as a surgical intervention .  The patient's history has been reviewed, patient examined, no change in status, stable for surgery.  I have reviewed the patient's chart and labs.  Questions were answered to the patient's satisfaction.    ICD Criteria  Current LVEF:25%  By cath. Within 12 months prior to implant: Yes   Heart failure history: Yes, Class II  Cardiomyopathy history: Yes, Non-Ischemic Cardiomyopathy.  Atrial Fibrillation/Atrial Flutter: No.  Ventricular tachycardia history: Yes, Hemodynamic instability present. VT Type is unknown.  Cardiac arrest history: Yes, Ventricular Fibrillation.  History of syndromes with risk of sudden death: No.  Previous ICD: No.  Current ICD indication: secondary  PPM indication: No.   Class I or II Bradycardia indication present: No  Beta Blocker therapy for 3 or more months: Yes, prescribed.   Ace Inhibitor/ARB therapy for 3 or more months: Yes, prescribed.     Thompson Grayer

## 2017-07-04 NOTE — Care Management Note (Addendum)
Case Management Note  Patient Details  Name: Jeffery Thomas MRN: [id number] Date of Birth: 1951-06-04  Subjective/Objective:  Pt presented for Acute Hypoxic Respiratory Failure/ Cardiac Arrest.  S/p Urgent Cath- Intubated and Self Extubated 06-30-17. Pt being treated for HCAP- IV Zosyn changed to po Augmentin. Plan will be for ICD- EP is following.  Pt seen by PT/OT recommendations for CIR since deconditioned. CIR is following the patient. PTA- pt was from home with wife.               Action/Plan: 07-03-17 CM did provide the patient's family with personal care agency list. Daughter felt like her mother needed some rest and would not be able to sit with the patient overnight. Other family members were exhausted. CM did explain to family this is an out of pocket cost. Family acknowledged an understanding. CM to monitor for additional needs.   Expected Discharge Date:                  Expected Discharge Plan:  IP Rehab Facility  In-House Referral:  Clinical Social Work  Discharge planning Services  CM Consult  Post Acute Care Choice:    Choice offered to:     DME Arranged:    DME Agency:     HH Arranged:    [place] Agency:     Status of Service:  In process, will continue to follow  If discussed at Long Length of Stay Meetings, dates discussed:  07-04-17  Additional Comments:  Bethena Roys, RN 07/04/2017, 2:53 PM

## 2017-07-04 NOTE — Consult Note (Signed)
Consult from 07/03/17 filed incorrectly:       Physical Medicine and Rehabilitation Consult  Reason for Consult: Debility due to cardiac arrest with anoxic encephalopathy Referring Physician: Dr. Claiborne Billings   HPI: Jeffery Thomas is a 66 y.o. male with history of HTN, GERD who was admitted on 06/26/17 with PEA cardiac arrest due to V fib/V tach and treated with CPR/ACLS protocol  X 20 minutes with ROSC.  History taken from chart review and family. EKG with inferior STEMI and he was treated with [place] protocol.  Cardiac cath negative for CAD with LVEF < 20% felt to be due to [facility] CM.  CT head reviewed, negative for acute changes and CT cervical spine with multilevel degenerative changes. EEG with diffuse cerebral dysfunction with additional occasional  focal slowing over right temporal region due to anoxic, ischemic, toxic/metabolic or medication effect. Repeat echo 8/22 revealed normal LV with mild LV hypertrophy and EF 35-40% with diffuse hypokinesis.  HCAP due to Kleb Pneumoniae and Staph aureus treated with zosyn--->augmentin.  Patient self extubated on 8/24 and has had issues with agitation due to delirium. EP consulted for input on ICD placement. Therapy evaluations revealed cognitive deficits as well as deficits in mobility therefore CIR recommended for follow up therapy.    Review of Systems  HENT: Positive for sore throat. Negative for hearing loss and tinnitus.   Eyes: Negative for blurred vision and double vision.  Respiratory: Negative for cough, shortness of breath and stridor.   Cardiovascular: Negative for chest pain and palpitations.  Gastrointestinal: Negative for constipation, heartburn and nausea.  Genitourinary: Negative for urgency.  Musculoskeletal: Negative for back pain and myalgias.  Skin: Negative for rash.  Neurological: Positive for speech change. Negative for dizziness and headaches.  Psychiatric/Behavioral: Positive for memory loss.  All other systems  reviewed and are negative.     Past Medical History:  Diagnosis Date  . Hypertension     Past Surgical History:  Procedure Laterality Date  . LEFT HEART CATH AND CORONARY ANGIOGRAPHY N/A 06/26/2017   Procedure: LEFT HEART CATH AND CORONARY ANGIOGRAPHY;  Surgeon: Jeffery Harp, MD;  Location: [facility];  Service: Cardiovascular;  Laterality: N/A;    Family History  Problem Relation Age of Onset  . Hypertension Mother   . Diabetes Father   . Cancer Brother        pancreatic    Social History:  Married. Retired at age 51. He smokes 2 cigars about 6 days a week while golfing. He has never used smokeless tobacco. He drinks a mixed drink and glass of alcohol at nights and beer occasionally. His drug history is not on file.    Allergies: No Known Allergies    Medications Prior to Admission  Medication Sig Dispense Refill  . amLODipine (NORVASC) 10 MG tablet Take 10 mg by mouth daily.    Marland Kitchen atorvastatin (LIPITOR) 40 MG tablet Take 40 mg by mouth daily.    . hydrochlorothiazide (HYDRODIURIL) 25 MG tablet Take 25 mg by mouth daily.    . hydrOXYzine (ATARAX/VISTARIL) 25 MG tablet Take 25 mg by mouth every 6 (six) hours as needed for itching.    . levocetirizine (XYZAL) 5 MG tablet Take 5 mg by mouth every morning.     . loperamide (IMODIUM) 2 MG capsule Take 2-4 mg by mouth daily as needed for diarrhea or loose stools.    . metoprolol succinate (TOPROL-XL) 25 MG 24 hr tablet Take 50 mg by mouth daily.    Marland Kitchen  montelukast (SINGULAIR) 10 MG tablet Take 10 mg by mouth every morning.     Marland Kitchen omeprazole (PRILOSEC) 20 MG capsule Take 40 mg by mouth daily.    Marland Kitchen PRESCRIPTION MEDICATION Steroid dose pack.    . ranitidine (ZANTAC) 150 MG tablet Take 150 mg by mouth 2 (two) times daily.      Home: Home Living Family/patient expects to be discharged to:: Private residence Living Arrangements: Spouse/significant other Available Help at Discharge: Family, Other (Comment) (can arrange  near 24 hour assist) Type of Home: House Home Access: Stairs to enter [employer] of Steps: 2 (2 garage door; 1 steep step front door) Entrance Stairs-Rails: None Home Layout: Two level, Able to live on main level with bedroom/bathroom ("man cave" downstairs) Alternate Level Stairs-Number of Steps: flight Alternate Level Stairs-Rails: Right (descending) Bathroom Shower/Tub: Walk-in shower  Lives With: Spouse  Functional History: Prior Function Level of Independence: Independent Comments: Futures trader; retired from [employer]:  Mobility:   Transfers Overall transfer level: Needs assistance Equipment used: None Transfers: Sit to/from [place] to Stand: Mod assist, Max assist General transfer comment: Noting good strength to power up; once standing, however, very slow balance reactions (without hip or ankle strategies employed), and requiring at least Mod/Max assist to prevent fall; better with use of UE support on RW; required step-by-step cues to sit Ambulation/Gait Ambulation/Gait assistance: +2 safety/equipment, Mod assist, Max assist Ambulation Distance (Feet):  (Hallway ambulation) Assistive device: Rolling walker (2 wheeled) Gait Pattern/deviations: Step-through pattern, Narrow base of support, Trunk flexed General Gait Details: Used bilateral UE support on RW (despite [name]' displeasure with the need for a RW); Tending to keep both hips slightly flexed throughout gait cycle; very narrow step width (like walking a line); noting at least 5 losses of balance requiring mon to mod assist to regain balance; at one point with loss of balance to the L including RW tipping as well -- max assist to prevent fall Gait velocity: varried    ADL:    Cognition: Cognition Overall Cognitive Status: Impaired/Different from baseline Arousal/Alertness: Awake/alert Orientation Level: Oriented to person, Oriented to place, Oriented to time, Disoriented to  situation Attention: Focused Focused Attention: Impaired Focused Attention Impairment: Verbal basic, Functional basic Memory: Impaired Memory Impairment: Storage deficit Awareness: Impaired Awareness Impairment: Intellectual impairment Problem Solving: Impaired Problem Solving Impairment: Verbal basic Behaviors: Restless, Impulsive, Perseveration, Poor frustration tolerance Safety/Judgment: Impaired Cognition Arousal/Alertness: Awake/alert Behavior During Therapy: Impulsive Overall Cognitive Status: Impaired/Different from baseline Area of Impairment: Attention, Memory, Safety/judgement, Problem solving Current Attention Level: Sustained Memory: Decreased short-term memory Safety/Judgement: Decreased awareness of safety, Decreased awareness of deficits Problem Solving: Difficulty sequencing General Comments: Did not initially remeber [facility] trip he tood earlier this summer; After walk ( in which he hadgreater than 5 losses of balance, some requiring mod/max assist to prevent fall, when asked if walking was easy or hard, he stated easy, and that he had no difficulty  Blood pressure (!) 152/93, pulse 60, temperature 98.1 F (36.7 C), temperature source Oral, resp. rate 20, height 6\' 2"  (1.88 m), weight 85 kg (187 lb 4.8 oz), SpO2 98 %. Physical Exam  Nursing note and vitals reviewed. Constitutional: He appears well-developed and well-nourished. No distress.  HENT:  Head: Normocephalic and atraumatic.  Mouth/Throat: Oropharynx is clear and moist.  Eyes: Pupils are equal, round, and reactive to light. Conjunctivae and EOM are normal.  Neck: Normal range of motion. Neck supple.  Cardiovascular: Normal rate and regular rhythm.  Respiratory: Effort normal and breath sounds normal. No stridor. No respiratory distress. He has no wheezes.  GI: Soft. Bowel sounds are normal. He exhibits no distension. There is no tenderness.  Musculoskeletal: He exhibits no edema or tenderness.   Neurological: He is alert.  Slow to process needing redirection.  Oriented to self. Needed cues to recall age and DOB.  He is able to follow simple one and two step motor commands.  Motor: 4+/5 throughout Sensation intact to light touch  Skin: Skin is warm and dry. He is not diaphoretic. No erythema.  Psychiatric: His affect is blunt. His speech is delayed. He is slowed. Cognition and memory are impaired. He expresses inappropriate judgment.    Results for orders placed or performed during the hospital encounter of 06/26/17 (from the past 24 hour(s))  Glucose, capillary     Status: Abnormal   Collection Time: 07/02/17 12:15 PM  Result Value Ref Range   Glucose-Capillary 121 (H) 65 - 99 mg/dL  Glucose, capillary     Status: Abnormal   Collection Time: 07/02/17  5:39 PM  Result Value Ref Range   Glucose-Capillary 109 (H) 65 - 99 mg/dL  Hemoglobin A1c     Status: Abnormal   Collection Time: 07/02/17  6:14 PM  Result Value Ref Range   Hgb A1c MFr Bld 6.3 (H) 4.8 - 5.6 %   Mean Plasma Glucose 134.11 mg/dL  Glucose, capillary     Status: Abnormal   Collection Time: 07/02/17  9:40 PM  Result Value Ref Range   Glucose-Capillary 103 (H) 65 - 99 mg/dL  Magnesium     Status: None   Collection Time: 07/03/17  7:05 AM  Result Value Ref Range   Magnesium 2.2 1.7 - 2.4 mg/dL  Phosphorus     Status: None   Collection Time: 07/03/17  7:05 AM  Result Value Ref Range   Phosphorus 3.9 2.5 - 4.6 mg/dL  Basic metabolic panel     Status: None   Collection Time: 07/03/17  7:05 AM  Result Value Ref Range   Sodium 140 135 - 145 mmol/L   Potassium 3.9 3.5 - 5.1 mmol/L   Chloride 102 101 - 111 mmol/L   CO2 26 22 - 32 mmol/L   Glucose, Bld 93 65 - 99 mg/dL   BUN 18 6 - 20 mg/dL   Creatinine, Ser 0.89 0.61 - 1.24 mg/dL   Calcium 8.9 8.9 - 10.3 mg/dL   GFR calc non Af Amer >60 >60 mL/min   GFR calc Af Amer >60 >60 mL/min   Anion gap 12 5 - 15  CBC     Status: Abnormal   Collection Time:  07/03/17  7:05 AM  Result Value Ref Range   WBC 10.7 (H) 4.0 - 10.5 K/uL   RBC 5.38 4.22 - 5.81 MIL/uL   Hemoglobin 15.4 13.0 - 17.0 g/dL   HCT 47.5 39.0 - 52.0 %   MCV 88.3 78.0 - 100.0 fL   MCH 28.6 26.0 - 34.0 pg   MCHC 32.4 30.0 - 36.0 g/dL   RDW 13.7 11.5 - 15.5 %   Platelets 225 150 - 400 K/uL  Glucose, capillary     Status: None   Collection Time: 07/03/17  7:16 AM  Result Value Ref Range   Glucose-Capillary 97 65 - 99 mg/dL   No results found.  Assessment/Plan: Diagnosis: Debility  Labs and images independently reviewed.  Records reviewed and summated above.  1. Does the need for close, 24  hr/day medical supervision in concert with the patient's rehab needs make it unreasonable for this patient to be served in a less intensive setting? Yes 2. Co-Morbidities requiring supervision/potential complications: HTN (monitor and provide prns in accordance with increased physical exertion and pain), GERD (cont meds), STEMI (meds), HCAP (abx), systolic CHF (Monitor in accordance with increased physical activity and avoid UE resistance excercises), [place]ub[place] CM (recs per Cards), leukocytosis (cont to monitor for signs and symptoms of infection, further workup if indicated), prediabetes (Monitor in accordance with exercise and adjust meds as necessary) 3. Due to safety, disease management, medication administration and patient education, does the patient require 24 hr/day rehab nursing? Yes 4. Does the patient require coordinated care of a physician, rehab nurse, PT (1-2 hrs/day, 5 days/week), OT (1-2 hrs/day, 5 days/week) and SLP (1-2 hrs/day, 5 days/week) to address physical and functional deficits in the context of the above medical diagnosis(es)? Yes Addressing deficits in the following areas: balance, endurance, locomotion, transferring, bathing, dressing, toileting, cognition and psychosocial support 5. Can the patient actively participate in an intensive therapy program of at least 3 hrs  of therapy per day at least 5 days per week? Potentially 6. The potential for patient to make measurable gains while on inpatient rehab is excellent 7. Anticipated functional outcomes upon discharge from inpatient rehab are supervision and min assist  with PT, supervision and min assist with OT, supervision and min assist with SLP. 8. Estimated rehab length of stay to reach the above functional goals is: 14-18 days. 9. Anticipated D/C setting: Home 10. Anticipated post D/C treatments: HH therapy and Home excercise program 11. Overall Rehab/Functional Prognosis: good  RECOMMENDATIONS: This patient's condition is appropriate for continued rehabilitative care in the following setting: CIR after completion of medical workup. Patient has agreed to participate in recommended program. Potentially Note that insurance prior authorization may be required for reimbursement for recommended care.  Comment: Rehab Admissions Coordinator to follow up.  Delice Lesch, MD, [address] [place] 07/03/2017

## 2017-07-04 NOTE — Progress Notes (Signed)
Progress Note  Patient Name: Jeffery Thomas Date of Encounter: 07/04/2017  Primary Cardiologist: Gwenlyn Found   Subjective   Sore throat today. But otherwise feeling ok.   Inpatient Medications    Scheduled Meds: . amoxicillin-clavulanate  1 tablet Oral Q12H  . carvedilol  3.125 mg Oral BID WC  . clonazePAM  0.5 mg Oral BID  . gentamicin irrigation  80 mg Irrigation To Cath  . insulin aspart  0-15 Units Subcutaneous TID WC  . insulin aspart  0-5 Units Subcutaneous QHS  . losartan  50 mg Oral Daily  . pantoprazole  40 mg Oral QHS  . sodium chloride flush  3 mL Intravenous Q12H   Continuous Infusions: . sodium chloride 250 mL (07/01/17 1900)  . sodium chloride 50 mL/hr at 07/04/17 1308  . sodium chloride    .  ceFAZolin (ANCEF) IV     PRN Meds: sodium chloride, acetaminophen, fentaNYL (SUBLIMAZE) injection, hydrALAZINE, menthol-cetylpyridinium, midazolam, ondansetron (ZOFRAN) IV, sodium chloride flush   Vital Signs    Vitals:   07/03/17 1744 07/03/17 2100 07/04/17 0400 07/04/17 0815  BP:  (!) 146/84 (!) 150/74 (!) 151/88  Pulse: 79 62 (!) 59 70  Resp:  18 18   Temp:  98.5 F (36.9 C) 98.4 F (36.9 C)   TempSrc:  Oral Oral   SpO2:  94% 94%   Weight:      Height:        Intake/Output Summary (Last 24 hours) at 07/04/17 1324 Last data filed at 07/04/17 0500  Gross per 24 hour  Intake              360 ml  Output              300 ml  Net               60 ml   Filed Weights   06/28/17 0352 06/29/17 0313 07/03/17 0558  Weight: 208 lb 8.9 oz (94.6 kg) 216 lb 4.3 oz (98.1 kg) 187 lb 4.8 oz (85 kg)    Telemetry    SR - Personally Reviewed  ECG    N/a - Personally Reviewed  Physical Exam   General: Well developed, well nourished, male appearing in no acute distress. Head: Normocephalic, atraumatic.  Neck: Supple without bruits, JVD. Lungs:  Resp regular and unlabored, Scattered Rhonchi. Heart: RRR, S1, S2, no S3, S4, or murmur; no rub. Abdomen: Soft,  non-tender, non-distended with normoactive bowel sounds. No hepatomegaly. No rebound/guarding. No obvious abdominal masses. Extremities: No clubbing, cyanosis, edema. Distal pedal pulses are 2+ bilaterally. Neuro: Alert and oriented X 3. Moves all extremities spontaneously. Psych: Normal affect.  Labs    Chemistry Recent Labs Lab 07/02/17 0407 07/03/17 0705 07/04/17 0223  NA 142 140 138  K 4.2 3.9 3.8  CL 104 102 105  CO2 25 26 25   GLUCOSE 95 93 131*  BUN 26* 18 19  CREATININE 0.98 0.89 0.86  CALCIUM 8.6* 8.9 8.5*  GFRNONAA >60 >60 >60  GFRAA >60 >60 >60  ANIONGAP 13 12 8      Hematology Recent Labs Lab 07/02/17 0407 07/03/17 0705 07/04/17 0223  WBC 12.4* 10.7* 11.2*  RBC 4.87 5.38 5.09  HGB 13.7 15.4 14.8  HCT 43.6 47.5 44.3  MCV 89.5 88.3 87.0  MCH 28.1 28.6 29.1  MCHC 31.4 32.4 33.4  RDW 13.9 13.7 13.5  PLT 177 225 244    Cardiac Enzymes Recent Labs Lab 06/27/17 1542  TROPONINI 2.78*  No results for input(s): TROPIPOC in the last 168 hours.   BNPNo results for input(s): BNP, PROBNP in the last 168 hours.   DDimer No results for input(s): DDIMER in the last 168 hours.    Radiology    No results found.  Cardiac Studies   TTE: 06/28/17  Study Conclusions  - Left ventricle: The cavity size was normal. Wall thickness was increased in a pattern of mild LVH. Systolic function was moderately reduced. The estimated ejection fraction was in the range of 35% to 40%. Diffuse hypokinesis. Doppler parameters are consistent with abnormal left ventricular relaxation (grade 1 diastolic dysfunction). - Aortic valve: There was no stenosis. - Mitral valve: There was no significant regurgitation. - Right ventricle: The cavity size was normal. Systolic function was mildly reduced. - Pulmonary arteries: No complete TR doppler jet so unable to estimate PA systolic pressure. - Systemic veins: The IVC measured 2.2 cm with < 50%  respirophasic variation, suggesting RA pressure 15 mmHg.  Impressions:  - Normal LV size with mild LV hypertrophy. EF 35-40%, diffuse hypokinesis. Normal RV size with mildly decreased systolic function. No significant valvular abnormalities.   Patient Profile     66 y.o. male  with history of HTN, HLD, GERD admitted following out of hospital cardiac arrest. He reported chest pain to his wife then collapsed in the other room. AED applied by EMS and pt was shocked. He then had PEA and then v. Fib with ROSC with 20 minutes of CPR. Emergent cardiac cath with normal coronary arteries. LVEF severely reduced at 25% on cath. Planned for ICD placement with EP.   Assessment & Plan    1. Cardiac arrest: No CAD on cath, but severely reduced EF 25% on Cath. Follow up echo showed EF of 35-40% with diffuse hypokinesis.  -- continue on ARB, Coreg  -- seen by EP with plans tentatively for ICD placement today  2. PNA: treated by PCCM. Coughing up phlegm, with sore throat.  -- Planned to continue Augmentin until 07/06/17.   3. Delirium: Much improved, but still foggy and slow to respond when questioned at times.   4. Deconditioning: Was seen by inpatient rehab admission for consideration. Recommending inpatient rehab at this time.  -- Seen by inpatient rehab MD, plans for transition at the time of DC. Being followed by Rehab team.   Signed, Reino Bellis, NP  07/04/2017, 1:24 PM  Pager # [phone]     Patient seen and examined. Agree with assessment and plan. No chest pain. To start spironolactone. Hopefully to get ICD later today.    Troy Sine, MD, [facility] 07/04/2017 1:43 PM

## 2017-07-04 NOTE — Progress Notes (Signed)
Progress Note  Patient Name: Jeffery Thomas Date of Encounter: 07/04/2017  Primary Cardiologist: new to [place] to chair.  C/o sore throat only, no CP or SOB.  He remains unclear on events that brought him to the hospital. Wife reports continued and significant improvement in mental status, reports he is ambulating much better and with little if any assist required in the room. The patient is anxious to get to rehab  Inpatient Medications    Scheduled Meds: . chlorhexidine      . amoxicillin-clavulanate  1 tablet Oral Q12H  . carvedilol  3.125 mg Oral BID WC  . clonazePAM  0.5 mg Oral BID  . heparin subcutaneous  5,000 Units Subcutaneous Q8H  . insulin aspart  0-15 Units Subcutaneous TID WC  . insulin aspart  0-5 Units Subcutaneous QHS  . losartan  50 mg Oral Daily  . pantoprazole  40 mg Oral QHS   Continuous Infusions: . sodium chloride 250 mL (07/01/17 1900)   PRN Meds: sodium chloride, acetaminophen, fentaNYL (SUBLIMAZE) injection, hydrALAZINE, menthol-cetylpyridinium, midazolam, ondansetron (ZOFRAN) IV   Vital Signs    Vitals:   07/03/17 1744 07/03/17 2100 07/04/17 0400 07/04/17 0815  BP:  (!) 146/84 (!) 150/74 (!) 151/88  Pulse: 79 62 (!) 59 70  Resp:  18 18   Temp:  98.5 F (36.9 C) 98.4 F (36.9 C)   TempSrc:  Oral Oral   SpO2:  94% 94%   Weight:      Height:        Intake/Output Summary (Last 24 hours) at 07/04/17 0910 Last data filed at 07/04/17 0500  Gross per 24 hour  Intake              600 ml  Output              300 ml  Net              300 ml   Filed Weights   06/28/17 0352 06/29/17 0313 07/03/17 0558  Weight: 208 lb 8.9 oz (94.6 kg) 216 lb 4.3 oz (98.1 kg) 187 lb 4.8 oz (85 kg)    Telemetry    SR- Personally Reviewed  ECG    No new EKGs - Personally Reviewed  Physical Exam   GEN: No acute distress.   Neck: No JVD Cardiac: RRR, no murmurs, rubs, or gallops.  Respiratory: CTA b/l GI: Soft, nontender,  non-distended  MS: No edema; No deformity. Neuro:  Nonfocal.  He is oriented to self, year, president, knows he is in a hospital.  Unable to tell me why or which hospital Psych: Normal affect, more engaged today  CHEST: there is a dressing to low sternum, RN reports a skin tear   Labs    Chemistry Recent Labs Lab 07/02/17 0407 07/03/17 0705 07/04/17 0223  NA 142 140 138  K 4.2 3.9 3.8  CL 104 102 105  CO2 25 26 25   GLUCOSE 95 93 131*  BUN 26* 18 19  CREATININE 0.98 0.89 0.86  CALCIUM 8.6* 8.9 8.5*  GFRNONAA >60 >60 >60  GFRAA >60 >60 >60  ANIONGAP 13 12 8      Hematology Recent Labs Lab 07/02/17 0407 07/03/17 0705 07/04/17 0223  WBC 12.4* 10.7* 11.2*  RBC 4.87 5.38 5.09  HGB 13.7 15.4 14.8  HCT 43.6 47.5 44.3  MCV 89.5 88.3 87.0  MCH 28.1 28.6 29.1  MCHC 31.4 32.4 33.4  RDW 13.9 13.7 13.5  PLT 177 225 244    Cardiac Enzymes Recent Labs Lab 06/27/17 1542  TROPONINI 2.78*   No results for input(s): TROPIPOC in the last 168 hours.   BNPNo results for input(s): BNP, PROBNP in the last 168 hours.   DDimer No results for input(s): DDIMER in the last 168 hours.   Radiology    No results found.  Cardiac Studies   06/26/17: [facility] IMPRESSION:Jeffery Thomas has normal coronary arteries and severe LV dysfunction in a configuration consistent with "Takatsubo Syndrome". His EF is approximately 20%. He was hemodynamically stable with no complex. He has had no arrhythmias. Potassium was 3.1 and he will be repleted. He will be systemically cooled . Pulmonary critical care medicine is aware. The sheath was removed and pressure held on the groin to achieve hemostasis. The patient left the lab in stable condition.  2-D echocardiogram 06/28/2017: Study Conclusions - Left ventricle: The cavity size was normal. Wall thickness was increased in a pattern of mild LVH. Systolic function was moderately reduced. The estimated ejection fraction was in the range of 35% to 40%.  Diffuse hypokinesis. Doppler parameters are consistent with abnormal left ventricular relaxation (grade 1 diastolic dysfunction). - Aortic valve: There was no stenosis. - Mitral valve: There was no significant regurgitation. - Right ventricle: The cavity size was normal. Systolic function was mildly reduced. - Pulmonary arteries: No complete TR doppler jet so unable to estimate PA systolic pressure. - Systemic veins: The IVC measured 2.2 cm with < 50% respirophasic variation, suggesting RA pressure 15 mmHg. Impressions: - Normal LV size with mild LV hypertrophy. EF 35-40%, diffuse hypokinesis. Normal RV size with mildly decreased systolic function. No significant valvular abnormalities.  Patient Profile     66 y.o. male  hx of HTN, HLD, GERD, s/p cardiac arrest   The patient's wife heard him fall, found him down and unresponsive. EMS was summoned, AED applied and he was shocked. He was then found to be in PEA and got epinephrine. He went into vfib again and was shocked, then pulseless VT and was shocked again. Finally attained perfusing rhythm with sinus tachy after about 20 minutes of CPR and treatment. He underwent emergent cath with no CAD, LVEF 20%, was cooled. 06/28/17 LVEF by echo 35-40%.  06/28/17 rewarming underway, 06/30/17, self extubated, initially quite confused, yesterday delerium felt to be much improved, and transferred out of ICU a couple days ago   Assessment & Plan    1. Witness cardiac arrest at home     Events as noted above     Unknown LVEF pre-event  Agree with ICD given cardiac arrest, secondary prevention, will try for today, Dr. Rayann Heman to see On coreg, losartan EF 25% by cath on arrival, 35-40% by echo  EKG with narrow QRS (12ms)  Patient and wife are made aware of [place] law, no driving for 6 months  06/27/17 blood cultures negative (x2), 5 days 06/29/17  Blood cultures (x2) neg 4 days  He has been afebrile, WBC today 11.2  2.  Pneumonia Klebsiella and staph in tracheal aspirate, s/p zosyn, currently on Augmentin (will complete 07/06/17 per cards note)  3. Encephalopathy     Continued improvement   Signed, Baldwin Jamaica, PA-C  07/04/2017, 9:10 AM    I have seen, examined the patient, and reviewed the above assessment and plan.  Changes to above are made where necessary.  On exam, sleeping but rouses.  He is recovering s/p VF arrest.  No reversible causes have  been found.  I would therefore advise ICD implantation for secondary prevention. He has a narrow complex QRS and does not meet criteria for CRT.  Risks, benefits, alternatives to ICD implantation were discussed in detail with the patient and his spouse today. They understand that the risks include but are not limited to bleeding, infection, pneumothorax, perforation, tamponade, vascular damage, renal failure, MI, stroke, death, inappropriate shocks, and lead dislodgement and wishes to proceed.  We will therefore schedule device implantation at the next available time.  He is tentatively scheduled for this afternoon.  Keep NPO for the procedure.   Co Sign: Thompson Grayer, MD 07/04/2017 11:42 AM

## 2017-07-04 NOTE — Clinical Social Work Note (Signed)
Clinical Social Work Assessment  Patient Details  Name: Jeffery Thomas MRN: [id number] Date of Birth: 02-17-1951  Date of referral:  07/04/17               Reason for consult:  Facility Placement                Permission sought to share information with:  Family Supports Permission granted to share information::  No (patient asleep, wife at bedside)  Name::     [place]::     Relationship::  spouse  Contact Information:  [phone]  Housing/Transportation Living arrangements for the past 2 months:  Single Family Home Source of Information:  Spouse Patient Interpreter Needed:  None Criminal Activity/Legal Involvement Pertinent to Current Situation/Hospitalization:  No - Comment as needed Significant Relationships:  Spouse Lives with:  Spouse Do you feel safe going back to the place where you live?  Yes Need for family participation in patient care:  No (Coment)  Care giving concerns:  Patient from home with wife. CIR following for placement. CSW following for possible SNF backup to CIR.   Social Worker assessment / plan:  CSW met with wife at bedside; patient was asleep. CSW explained reasoning for SNF backup to CIR. Wife open to SNF if CIR unable to take patient. Per CIR notes, CIR has opened case with patient's insurance. CSW will continue to follow and make referrals to SNFs if necessary.  Employment status:  Retired Estate agent (ex-military)) PT Recommendations:  Inpatient Rehab Consult Information / Referral to community resources:  [place]  Patient/Family's Response to care: Wife appreciative of care.  Patient/Family's Understanding of and Emotional Response to Diagnosis, Current Treatment, and Prognosis: Family with understanding of patient's condition and hopeful for CIR placement.   Emotional Assessment Appearance:  Appears stated age Attitude/Demeanor/Rapport:  Unable to Assess Affect (typically  observed):  Unable to Assess Orientation:  Oriented to Self, Oriented to Place, Oriented to  Time Alcohol / Substance use:  Not Applicable Psych involvement (Current and /or in the community):  No (Comment)  Discharge Needs  Concerns to be addressed:  Discharge Planning Concerns Readmission within the last 30 days:  No Current discharge risk:  Physical Impairment Barriers to Discharge:  Continued Medical Work up   Estanislado Emms, LCSW 07/04/2017, 10:42 AM

## 2017-07-04 NOTE — Progress Notes (Signed)
Nutrition Follow-up  DOCUMENTATION CODES:   Not applicable  INTERVENTION:    Ensure Enlive po TID, each supplement provides 350 kcal and 20 grams of protein  NUTRITION DIAGNOSIS:   Inadequate oral intake related to poor appetite as evidenced by per patient/family report.  Ongoing  GOAL:   Patient will meet greater than or equal to 90% of their needs  Unmet  MONITOR:   PO intake, Supplement acceptance  ASSESSMENT:   66 y.o. Male with a history of HTN, hyperlipidemia, and GERD had chest pain then cardiac arrest.   Patient is NPO for possible ICD placement today. Previously on a heart healthy diet, consuming 50-75% of meals. He does not like the hospital food. Wife plans to bring him a burger and milkshake later today, she okayed it with his physician. Patient agreed that he is not eating enough and that he would drink Ensure supplements between meals to maximize intake. Labs and medications reviewed.   Diet Order:  Diet NPO time specified  Skin:  Reviewed, no issues  Last BM:  8/26  Height:   Ht Readings from Last 1 Encounters:  06/26/17 6\' 2"  (1.88 m)    Weight:   Wt Readings from Last 1 Encounters:  07/03/17 187 lb 4.8 oz (85 kg)    Ideal Body Weight:  86.3 kg  BMI:  Body mass index is 24.05 kg/m.  Estimated Nutritional Needs:   Kcal:  2200-2400  Protein:  110-130 gm  Fluid:  2.2-2.4 L  EDUCATION NEEDS:   No education needs identified at this time  Molli Barrows, [place], [place], [place] Pager [phone] After Hours Pager [phone]

## 2017-07-04 NOTE — H&P (View-Only) (Signed)
Progress Note  Patient Name: Jeffery Thomas Date of Encounter: 07/04/2017  Primary Cardiologist: new to [place] to chair.  C/o sore throat only, no CP or SOB.  He remains unclear on events that brought him to the hospital. Wife reports continued and significant improvement in mental status, reports he is ambulating much better and with little if any assist required in the room. The patient is anxious to get to rehab  Inpatient Medications    Scheduled Meds: . chlorhexidine      . amoxicillin-clavulanate  1 tablet Oral Q12H  . carvedilol  3.125 mg Oral BID WC  . clonazePAM  0.5 mg Oral BID  . heparin subcutaneous  5,000 Units Subcutaneous Q8H  . insulin aspart  0-15 Units Subcutaneous TID WC  . insulin aspart  0-5 Units Subcutaneous QHS  . losartan  50 mg Oral Daily  . pantoprazole  40 mg Oral QHS   Continuous Infusions: . sodium chloride 250 mL (07/01/17 1900)   PRN Meds: sodium chloride, acetaminophen, fentaNYL (SUBLIMAZE) injection, hydrALAZINE, menthol-cetylpyridinium, midazolam, ondansetron (ZOFRAN) IV   Vital Signs    Vitals:   07/03/17 1744 07/03/17 2100 07/04/17 0400 07/04/17 0815  BP:  (!) 146/84 (!) 150/74 (!) 151/88  Pulse: 79 62 (!) 59 70  Resp:  18 18   Temp:  98.5 F (36.9 C) 98.4 F (36.9 C)   TempSrc:  Oral Oral   SpO2:  94% 94%   Weight:      Height:        Intake/Output Summary (Last 24 hours) at 07/04/17 0910 Last data filed at 07/04/17 0500  Gross per 24 hour  Intake              600 ml  Output              300 ml  Net              300 ml   Filed Weights   06/28/17 0352 06/29/17 0313 07/03/17 0558  Weight: 208 lb 8.9 oz (94.6 kg) 216 lb 4.3 oz (98.1 kg) 187 lb 4.8 oz (85 kg)    Telemetry    SR- Personally Reviewed  ECG    No new EKGs - Personally Reviewed  Physical Exam   GEN: No acute distress.   Neck: No JVD Cardiac: RRR, no murmurs, rubs, or gallops.  Respiratory: CTA b/l GI: Soft, nontender,  non-distended  MS: No edema; No deformity. Neuro:  Nonfocal.  He is oriented to self, year, president, knows he is in a hospital.  Unable to tell me why or which hospital Psych: Normal affect, more engaged today  CHEST: there is a dressing to low sternum, RN reports a skin tear   Labs    Chemistry Recent Labs Lab 07/02/17 0407 07/03/17 0705 07/04/17 0223  NA 142 140 138  K 4.2 3.9 3.8  CL 104 102 105  CO2 25 26 25   GLUCOSE 95 93 131*  BUN 26* 18 19  CREATININE 0.98 0.89 0.86  CALCIUM 8.6* 8.9 8.5*  GFRNONAA >60 >60 >60  GFRAA >60 >60 >60  ANIONGAP 13 12 8      Hematology Recent Labs Lab 07/02/17 0407 07/03/17 0705 07/04/17 0223  WBC 12.4* 10.7* 11.2*  RBC 4.87 5.38 5.09  HGB 13.7 15.4 14.8  HCT 43.6 47.5 44.3  MCV 89.5 88.3 87.0  MCH 28.1 28.6 29.1  MCHC 31.4 32.4 33.4  RDW 13.9 13.7 13.5  PLT 177 225 244    Cardiac Enzymes Recent Labs Lab 06/27/17 1542  TROPONINI 2.78*   No results for input(s): TROPIPOC in the last 168 hours.   BNPNo results for input(s): BNP, PROBNP in the last 168 hours.   DDimer No results for input(s): DDIMER in the last 168 hours.   Radiology    No results found.  Cardiac Studies   06/26/17: [facility] IMPRESSION:Mr. Potenza has normal coronary arteries and severe LV dysfunction in a configuration consistent with "Takatsubo Syndrome". His EF is approximately 20%. He was hemodynamically stable with no complex. He has had no arrhythmias. Potassium was 3.1 and he will be repleted. He will be systemically cooled . Pulmonary critical care medicine is aware. The sheath was removed and pressure held on the groin to achieve hemostasis. The patient left the lab in stable condition.  2-D echocardiogram 06/28/2017: Study Conclusions - Left ventricle: The cavity size was normal. Wall thickness was increased in a pattern of mild LVH. Systolic function was moderately reduced. The estimated ejection fraction was in the range of 35% to 40%.  Diffuse hypokinesis. Doppler parameters are consistent with abnormal left ventricular relaxation (grade 1 diastolic dysfunction). - Aortic valve: There was no stenosis. - Mitral valve: There was no significant regurgitation. - Right ventricle: The cavity size was normal. Systolic function was mildly reduced. - Pulmonary arteries: No complete TR doppler jet so unable to estimate PA systolic pressure. - Systemic veins: The IVC measured 2.2 cm with < 50% respirophasic variation, suggesting RA pressure 15 mmHg. Impressions: - Normal LV size with mild LV hypertrophy. EF 35-40%, diffuse hypokinesis. Normal RV size with mildly decreased systolic function. No significant valvular abnormalities.  Patient Profile     66 y.o. male  hx of HTN, HLD, GERD, s/p cardiac arrest   The patient's wife heard him fall, found him down and unresponsive. EMS was summoned, AED applied and he was shocked. He was then found to be in PEA and got epinephrine. He went into vfib again and was shocked, then pulseless VT and was shocked again. Finally attained perfusing rhythm with sinus tachy after about 20 minutes of CPR and treatment. He underwent emergent cath with no CAD, LVEF 20%, was cooled. 06/28/17 LVEF by echo 35-40%.  06/28/17 rewarming underway, 06/30/17, self extubated, initially quite confused, yesterday delerium felt to be much improved, and transferred out of ICU a couple days ago   Assessment & Plan    1. Witness cardiac arrest at home     Events as noted above     Unknown LVEF pre-event  Agree with ICD given cardiac arrest, secondary prevention, will try for today, Dr. Rayann Heman to see On coreg, losartan EF 25% by cath on arrival, 35-40% by echo  EKG with narrow QRS (47ms)  Patient and wife are made aware of [place] law, no driving for 6 months  06/27/17 blood cultures negative (x2), 5 days 06/29/17  Blood cultures (x2) neg 4 days  He has been afebrile, WBC today 11.2  2.  Pneumonia Klebsiella and staph in tracheal aspirate, s/p zosyn, currently on Augmentin (will complete 07/06/17 per cards note)  3. Encephalopathy     Continued improvement   Signed, Baldwin Jamaica, PA-C  07/04/2017, 9:10 AM    I have seen, examined the patient, and reviewed the above assessment and plan.  Changes to above are made where necessary.  On exam, sleeping but rouses.  He is recovering s/p VF arrest.  No reversible causes have  been found.  I would therefore advise ICD implantation for secondary prevention. He has a narrow complex QRS and does not meet criteria for CRT.  Risks, benefits, alternatives to ICD implantation were discussed in detail with the patient and his spouse today. They understand that the risks include but are not limited to bleeding, infection, pneumothorax, perforation, tamponade, vascular damage, renal failure, MI, stroke, death, inappropriate shocks, and lead dislodgement and wishes to proceed.  We will therefore schedule device implantation at the next available time.  He is tentatively scheduled for this afternoon.  Keep NPO for the procedure.   Co Sign: Thompson Grayer, MD 07/04/2017 11:42 AM

## 2017-07-04 NOTE — PMR Pre-admission (Signed)
PMR Admission Coordinator Pre-Admission Assessment  Patient: Jeffery Thomas is an 66 y.o., male MRN: [id number] DOB: [birth year] Height: _0  (188 cm) Weight: 85 kg (187 lb 4.8 oz)              Insurance Information HMO:    PPO:      PCP:      IPA:      80/20:      OTHER: Dierdre Highman retired PRIMARY: Veterinary surgeon      Policy#: [id number]      Subscriber:  [place] Name: Burman Nieves      Phone#:  [phone]     Fax#: [phone] Pre-Cert#: [id number]  For 24 days with update due 07/29/17      Employer: Retired Therapist, art Benefits:  Phone #: [phone]     Name:  Fax and [place]. Date: 11/07/16     Deduct: $150 (met $150)      Catastrophic Cap: $3000 (met $815.83)      Life Max:  N/A CIR: $250/day or 75% and then 80%      SNF: $250/day up to 75% and then 80% Outpatient:      Co-Pay: $41/visit Home Health: 100%      Co-Pay: none DME: 80%     Co-Pay: 20% Providers: in network  Medicaid Application Date:        Case Manager:   Disability Application Date:        Case Worker:    Emergency Contact Information Contact Information    Name Relation Home Work Mobile   [place]    [place]    [phone]     Current Medical History  Patient Admitting Diagnosis: Debility   History of Present Illness: A 66 y.o. male with history of HTN, GERD who was admitted on 06/26/17 with PEA cardiac arrest due to V fib/V tach and treated with CPR/ACLS protocol  X 20 minutes with ROSC.  History taken from chart review and family. EKG with inferior STEMI and he was treated with [place] protocol.  Cardiac cath negative for CAD with LVEF < 20% felt to be due to [facility] CM.  CT head reviewed, negative for acute changes and CT cervical spine with multilevel degenerative changes. EEG with diffuse cerebral dysfunction with additional occasional  focal slowing over right temporal region due to anoxic, ischemic, toxic/metabolic or medication effect. Repeat echo  8/22 revealed normal LV with mild LV hypertrophy and EF 35-40% with diffuse hypokinesis.  HCAP due to Kleb Pneumoniae and Staph aureus treated with zosyn--->augmentin.  Patient self extubated on 8/24 and has had issues with agitation due to delirium. EP consulted for input on ICD placement. ICD placed left chest 07/04/17.  Therapy evaluations revealed cognitive deficits as well as deficits in mobility therefore CIR recommended for follow up therapy.    Past Medical History  Past Medical History:  Diagnosis Date  . Hypertension     Family History  family history includes Cancer in his brother; Diabetes in his father; Hypertension in his mother.  Prior Rehab/Hospitalizations: Had [facility] and outpatient therapy 8 yrs ago after TKR  Has the patient had major surgery during 100 days prior to admission? No  Current Medications   Current Facility-Administered Medications:  .  0.9 %  sodium chloride infusion, 250 mL, Intravenous, PRN, Allred, James, MD .  acetaminophen (TYLENOL) tablet 325-650 mg, 325-650 mg, Oral, Q4H PRN, Allred, James, MD .  amoxicillin-clavulanate (AUGMENTIN) 875-125 MG per tablet 1  tablet, 1 tablet, Oral, Q12H, Omar Person, NP, 1 tablet at 07/05/17 0908 .  carvedilol (COREG) tablet 3.125 mg, 3.125 mg, Oral, BID WC, Minus Breeding, MD, 3.125 mg at 07/05/17 0906 .  ceFAZolin (ANCEF) IVPB 1 g/50 mL premix, 1 g, Intravenous, Q6H, Allred, James, MD, Last Rate: 100 mL/hr at 07/05/17 0616, 1 g at 07/05/17 0616 .  clonazePAM (KLONOPIN) tablet 0.5 mg, 0.5 mg, Oral, BID, Omar Person, NP, 0.5 mg at 07/05/17 0907 .  feeding supplement (ENSURE ENLIVE) (ENSURE ENLIVE) liquid 237 mL, 237 mL, Oral, TID BM, Lorretta Harp, MD, 237 mL at 07/05/17 0907 .  HYDROcodone-acetaminophen (NORCO/VICODIN) 5-325 MG per tablet 1-2 tablet, 1-2 tablet, Oral, Q4H PRN, Allred, James, MD .  insulin aspart (novoLOG) injection 0-15 Units, 0-15 Units, Subcutaneous, TID WC, Barrett, Rhonda G, PA-C, 2  Units at 07/05/17 0907 .  insulin aspart (novoLOG) injection 0-5 Units, 0-5 Units, Subcutaneous, QHS, Barrett, Rhonda G, PA-C .  losartan (COZAAR) tablet 50 mg, 50 mg, Oral, Daily, Minus Breeding, MD, 50 mg at 07/05/17 0907 .  menthol-cetylpyridinium (CEPACOL) lozenge 3 mg, 1 lozenge, Oral, PRN, Reino Bellis B, NP, 3 mg at 07/04/17 1136 .  ondansetron (ZOFRAN) injection 4 mg, 4 mg, Intravenous, Q6H PRN, Allred, James, MD .  pantoprazole (PROTONIX) EC tablet 40 mg, 40 mg, Oral, QHS, Lorretta Harp, MD, 40 mg at 07/04/17 2249 .  sodium chloride flush (NS) 0.9 % injection 3 mL, 3 mL, Intravenous, Q12H, Allred, James, MD, 3 mL at 07/05/17 0908 .  sodium chloride flush (NS) 0.9 % injection 3 mL, 3 mL, Intravenous, PRN, Allred, James, MD .  spironolactone (ALDACTONE) tablet 12.5 mg, 12.5 mg, Oral, BID, Reino Bellis B, NP, 12.5 mg at 07/05/17 0240  Patients Current Diet: Diet Heart Room service appropriate? Yes; Fluid consistency: Thin  Precautions / Restrictions Precautions Precautions: Fall Restrictions Weight Bearing Restrictions: No   Has the patient had 2 or more falls or a fall with injury in the past year?No  Prior Activity Level Community (5-7x/wk): Went out daily, played golf, worked out, was driving.  Home Assistive Devices / Equipment Home Assistive Devices/Equipment: None  Prior Device Use: Indicate devices/aids used by the patient prior to current illness, exacerbation or injury? None  Prior Functional Level Prior Function Level of Independence: Independent Comments: Loves golf; retired from [employer]: Did the patient need help bathing, dressing, using the toilet or eating?  Independent  Indoor Mobility: Did the patient need assistance with walking from room to room (with or without device)? Independent  Stairs: Did the patient need assistance with internal or external stairs (with or without device)? Independent  Functional Cognition: Did the patient  need help planning regular tasks such as shopping or remembering to take medications? Independent  Current Functional Level Cognition  Arousal/Alertness: Awake/alert Overall Cognitive Status: Impaired/Different from baseline Current Attention Level: Sustained Orientation Level: Oriented X4, Other (comment) (Implsibe and intermittent confusion) Safety/Judgement: Decreased awareness of safety, Decreased awareness of deficits General Comments: Stated Obama is president, is in the hospital becasue he had a small stroke and feels his only problems are with balance; unable to tell me golf course where he lives and where he lived prior to moving to [place];  Attention: Focused Focused Attention: Impaired Focused Attention Impairment: Verbal basic, Functional basic Memory: Impaired Memory Impairment: Storage deficit Awareness: Impaired Awareness Impairment: Intellectual impairment Problem Solving: Impaired Problem Solving Impairment: Verbal basic Behaviors: Restless, Impulsive, Perseveration, Poor frustration tolerance Safety/Judgment: Impaired  Extremity Assessment (includes Sensation/Coordination)  Upper Extremity Assessment: Generalized weakness  Lower Extremity Assessment: Defer to PT evaluation RLE Deficits / Details: Strength WFL for simple mobility tasks; Noting significantly decr coordination, with decr balance reactions bilaterally LLE Deficits / Details: Strength WFL for simple mobility tasks; Noting significantly decr coordination, with decr balance reactions bilaterally    ADLs  Overall ADL's : Needs assistance/impaired Grooming: Standing, Minimal assistance Grooming Details (indicate cue type and reason): a for balance Upper Body Bathing: Set up, Supervision/ safety, Sitting Lower Body Bathing: Minimal assistance, Sit to/from stand Upper Body Dressing : Set up, Supervision/safety, Sitting Lower Body Dressing: Minimal assistance, Sit to/from stand Toilet Transfer:  Moderate assistance Toileting- Clothing Manipulation and Hygiene: Minimal assistance, Sit to/from stand Functional mobility during ADLs: Moderate assistance, Cueing for safety (without AD)    Mobility  General bed mobility comments: OOB in chair    Transfers  Overall transfer level: Needs assistance Equipment used: None Transfers: Sit to/from Stand Sit to Stand: Min assist General transfer comment:  LOB in standing. Poor awareness of balance issues    Ambulation / Gait / Stairs / Wheelchair Mobility  Ambulation/Gait Ambulation/Gait assistance: +2 safety/equipment, Mod assist, Max assist Ambulation Distance (Feet): 200 Feet Assistive device: Rolling walker (2 wheeled) Gait Pattern/deviations: Step-through pattern, Narrow base of support, Trunk flexed General Gait Details: Used bilateral UE support on RW (despite Mr. Meir' displeasure with the need for a RW); Tending to keep both hips slightly flexed throughout gait cycle; very narrow step width (like walking a line); noting at least 5 losses of balance requiring mon to mod assist to regain balance; at one point with loss of balance to the L including RW tipping as well -- max assist to prevent fall Gait velocity: varied Gait velocity interpretation: Below normal speed for age/gender    Posture / Balance Balance Overall balance assessment: Needs assistance Sitting-balance support: Feet supported Sitting balance-Leahy Scale: Good Standing balance-Leahy Scale: Poor Standing balance comment: very slow balance reactions; reaching for support surfaces    Special needs/care consideration BiPAP/CPAP Yes, CPAP CPM No Continuous Drip IV 0.9% NS at 50 mL/hr Dialysis No      Life Vest No Oxygen No Special Bed No Trach Size No Wound Vac (area) No      Skin Has dry, red skin that peels and has to take steroids at times.  New dressing to left chest over ICD placed 07/04/17                               Bowel mgmt: Last BM 07/04/17 Bladder  mgmt: Using urinal WDL Diabetic mgmt No    Previous Home Environment Living Arrangements: Spouse/significant other  Lives With: Spouse Available Help at Discharge: Family, Other (Comment) (can arrange near 24 hour assist) Type of Home: House Home Layout: Two level, Able to live on main level with bedroom/bathroom ("man cave" downstairs) Alternate Level Stairs-Rails: Right (descending) Alternate Level Stairs-Number of Steps: flight Home Access: Stairs to enter Entrance Stairs-Rails: None Entrance Stairs-Number of Steps: 2 (2 garage door; 1 steep step front door) Bathroom Shower/Tub: Multimedia programmer: Standard Bathroom Accessibility: Yes How Accessible: Accessible via walker [place]: No  Discharge Living Setting Plans for Discharge Living Setting: Patient's home, House, Lives with (comment) (Lives with wife.) Type of Home at Discharge: House Discharge Home Layout: Two level, Laundry or work area in basement, Able to live on main level with bedroom/bathroom Alternate  Level Stairs-Number of Steps: Flight (Basement with man cave, bedroom/bathroom.) Discharge Home Access: Stairs to enter Entrance Stairs-Number of Steps: 2 steps at front and garage entry Does the patient have any problems obtaining your medications?: No  Social/Family/Support Systems Patient Roles: Spouse, Parent, Other (Comment) (Has a wife, dtr, son, brother and sister-in-law.) Contact Information: Ziah Leandro - wife - (c) [phone] Anticipated Caregiver: Wife and brother Ability/Limitations of Caregiver: Wife works 8am to 5 pm.  Brother, [place], plans to stay during the day. Caregiver Availability: 24/7 Discharge Plan Discussed with Primary Caregiver: Yes Is Caregiver In Agreement with Plan?: Yes Does Caregiver/Family have Issues with Lodging/Transportation while Pt is in Rehab?: No  Goals/Additional Needs Patient/Family Goal for Rehab: PT/OT/SLP supervision to min assist  goals Expected length of stay: 14-18 days Cultural Considerations: None Dietary Needs: Heart diet, thin liquids Equipment Needs: TBD Pt/Family Agrees to Admission and willing to participate: Yes Program Orientation Provided & Reviewed with Pt/Caregiver Including Roles  & Responsibilities: Yes  Decrease burden of Care through IP rehab admission: N/A  Possible need for SNF placement upon discharge: Not anticipated  Patient Condition: This patient's medical and functional status has changed since the consult dated: 07/03/17 in which the Rehabilitation Physician determined and documented that the patient's condition is appropriate for intensive rehabilitative care in an inpatient rehabilitation facility. See "History of Present Illness" (above) for medical update. Functional changes are: Currently requiring min assist for transfers and ambulated 200 feet mod assist +2 RW. Patient's medical and functional status update has been discussed with the Rehabilitation physician and patient remains appropriate for inpatient rehabilitation. Will admit to inpatient rehab today.  Preadmission Screen Completed By:  Retta Diones, 07/05/2017 11:09 AM ______________________________________________________________________   Discussed status with Dr. Posey Pronto on 07/05/17 at 1108 and received telephone approval for admission today.  Admission Coordinator:  Retta Diones, time 1109/Date 07/05/17

## 2017-07-05 ENCOUNTER — Encounter (HOSPITAL_COMMUNITY)
Admission: EM | Disposition: A | Discharge status: Rehabilitation Facility | Payer: Self-pay | Source: Home / Self Care | Attending: Cardiovascular Disease

## 2017-07-05 ENCOUNTER — Encounter (HOSPITAL_COMMUNITY): Payer: Self-pay | Admitting: Internal Medicine

## 2017-07-05 ENCOUNTER — Inpatient Hospital Stay (HOSPITAL_COMMUNITY)
Admission: RE | Admit: 2017-07-05 | Discharge: 2017-07-12 | DRG: 091 | Disposition: A | Discharge status: Home / Self Care | Source: Intra-hospital | Attending: Physical Medicine & Rehabilitation | Admitting: Physical Medicine & Rehabilitation

## 2017-07-05 ENCOUNTER — Inpatient Hospital Stay (HOSPITAL_COMMUNITY)

## 2017-07-05 DIAGNOSIS — Z79899 Other long term (current) drug therapy: Secondary | ICD-10-CM

## 2017-07-05 DIAGNOSIS — D72829 Elevated white blood cell count, unspecified: Secondary | ICD-10-CM

## 2017-07-05 DIAGNOSIS — I6782 Cerebral ischemia: Secondary | ICD-10-CM

## 2017-07-05 DIAGNOSIS — G9340 Encephalopathy, unspecified: Secondary | ICD-10-CM

## 2017-07-05 DIAGNOSIS — I119 Hypertensive heart disease without heart failure: Secondary | ICD-10-CM | POA: Diagnosis present

## 2017-07-05 DIAGNOSIS — F17290 Nicotine dependence, other tobacco product, uncomplicated: Secondary | ICD-10-CM | POA: Diagnosis present

## 2017-07-05 DIAGNOSIS — J189 Pneumonia, unspecified organism: Secondary | ICD-10-CM | POA: Diagnosis not present

## 2017-07-05 DIAGNOSIS — J1520 Pneumonia due to staphylococcus, unspecified: Secondary | ICD-10-CM | POA: Diagnosis present

## 2017-07-05 DIAGNOSIS — R2689 Other abnormalities of gait and mobility: Secondary | ICD-10-CM | POA: Diagnosis present

## 2017-07-05 DIAGNOSIS — K219 Gastro-esophageal reflux disease without esophagitis: Secondary | ICD-10-CM

## 2017-07-05 DIAGNOSIS — Z95810 Presence of automatic (implantable) cardiac defibrillator: Secondary | ICD-10-CM | POA: Diagnosis not present

## 2017-07-05 DIAGNOSIS — Z8249 Family history of ischemic heart disease and other diseases of the circulatory system: Secondary | ICD-10-CM

## 2017-07-05 DIAGNOSIS — J020 Streptococcal pharyngitis: Secondary | ICD-10-CM | POA: Diagnosis not present

## 2017-07-05 DIAGNOSIS — Z8669 Personal history of other diseases of the nervous system and sense organs: Secondary | ICD-10-CM

## 2017-07-05 DIAGNOSIS — Z8674 Personal history of sudden cardiac arrest: Secondary | ICD-10-CM

## 2017-07-05 DIAGNOSIS — R4189 Other symptoms and signs involving cognitive functions and awareness: Secondary | ICD-10-CM | POA: Diagnosis present

## 2017-07-05 DIAGNOSIS — R7303 Prediabetes: Secondary | ICD-10-CM | POA: Diagnosis present

## 2017-07-05 DIAGNOSIS — R5381 Other malaise: Secondary | ICD-10-CM | POA: Diagnosis present

## 2017-07-05 DIAGNOSIS — I10 Essential (primary) hypertension: Secondary | ICD-10-CM

## 2017-07-05 DIAGNOSIS — G931 Anoxic brain damage, not elsewhere classified: Secondary | ICD-10-CM | POA: Diagnosis present

## 2017-07-05 DIAGNOSIS — I5021 Acute systolic (congestive) heart failure: Secondary | ICD-10-CM

## 2017-07-05 DIAGNOSIS — Z959 Presence of cardiac and vascular implant and graft, unspecified: Secondary | ICD-10-CM

## 2017-07-05 HISTORY — DX: Personal history of other diseases of the nervous system and sense organs: Z86.69

## 2017-07-05 LAB — BASIC METABOLIC PANEL
ANION GAP: 9 (ref 5–15)
BUN: 18 mg/dL (ref 6–20)
CALCIUM: 8.6 mg/dL — AB (ref 8.9–10.3)
CO2: 24 mmol/L (ref 22–32)
Chloride: 105 mmol/L (ref 101–111)
Creatinine, Ser: 0.84 mg/dL (ref 0.61–1.24)
Glucose, Bld: 120 mg/dL — ABNORMAL HIGH (ref 65–99)
Potassium: 4.1 mmol/L (ref 3.5–5.1)
SODIUM: 138 mmol/L (ref 135–145)

## 2017-07-05 LAB — GLUCOSE, CAPILLARY
GLUCOSE-CAPILLARY: 161 mg/dL — AB (ref 65–99)
GLUCOSE-CAPILLARY: 119 mg/dL — AB (ref 65–99)
GLUCOSE-CAPILLARY: 164 mg/dL — AB (ref 65–99)
Glucose-Capillary: 125 mg/dL — ABNORMAL HIGH (ref 65–99)

## 2017-07-05 LAB — MAGNESIUM: Magnesium: 2.2 mg/dL (ref 1.7–2.4)

## 2017-07-05 SURGERY — ICD IMPLANT

## 2017-07-05 MED ORDER — ENOXAPARIN SODIUM 40 MG/0.4ML ~~LOC~~ SOLN
40.0000 mg | SUBCUTANEOUS | Status: DC
  Administered 2017-07-05 – 2017-07-11 (×7): 40 mg via SUBCUTANEOUS
  Filled 2017-07-05 (×7): qty 0.4

## 2017-07-05 MED ORDER — LOSARTAN POTASSIUM 50 MG PO TABS
75.0000 mg | ORAL_TABLET | Freq: Every day | ORAL | 0 refills | Status: DC
Start: 2017-07-06 — End: 2017-07-12

## 2017-07-05 MED ORDER — AMOXICILLIN-POT CLAVULANATE 875-125 MG PO TABS
1.0000 | ORAL_TABLET | Freq: Two times a day (BID) | ORAL | Status: AC
  Administered 2017-07-05 – 2017-07-06 (×3): 1 via ORAL
  Filled 2017-07-05 (×3): qty 1

## 2017-07-05 MED ORDER — AMOXICILLIN-POT CLAVULANATE 875-125 MG PO TABS: 1.0000 | ORAL_TABLET | Freq: Two times a day (BID) | ORAL | 0 refills | Status: DC

## 2017-07-05 MED ORDER — ACETAMINOPHEN 325 MG PO TABS: 325.0000 mg | ORAL_TABLET | ORAL | Status: DC | PRN

## 2017-07-05 MED ORDER — PROCHLORPERAZINE MALEATE 5 MG PO TABS: 5.0000 mg | ORAL_TABLET | Freq: Four times a day (QID) | ORAL | Status: DC | PRN

## 2017-07-05 MED ORDER — PROCHLORPERAZINE 25 MG RE SUPP: 12.5000 mg | Freq: Four times a day (QID) | RECTAL | Status: DC | PRN

## 2017-07-05 MED ORDER — SPIRONOLACTONE 25 MG PO TABS
12.5000 mg | ORAL_TABLET | Freq: Two times a day (BID) | ORAL | Status: DC
  Administered 2017-07-06 – 2017-07-12 (×13): 12.5 mg via ORAL
  Filled 2017-07-05 (×13): qty 1

## 2017-07-05 MED ORDER — PHENOL 1.4 % MT LIQD: 1.0000 | OROMUCOSAL | Status: DC | PRN

## 2017-07-05 MED ORDER — ALUM & MAG HYDROXIDE-SIMETH 200-200-20 MG/5ML PO SUSP: 30.0000 mL | ORAL | Status: DC | PRN

## 2017-07-05 MED ORDER — POLYETHYLENE GLYCOL 3350 17 G PO PACK
17.0000 g | PACK | Freq: Every day | ORAL | Status: DC | PRN
  Administered 2017-07-08: 17 g via ORAL
  Filled 2017-07-05: qty 1

## 2017-07-05 MED ORDER — CARVEDILOL 3.125 MG PO TABS
3.1250 mg | ORAL_TABLET | Freq: Two times a day (BID) | ORAL | Status: DC
Start: 2017-07-06 — End: 2017-07-12
  Administered 2017-07-06 – 2017-07-12 (×13): 3.125 mg via ORAL
  Filled 2017-07-05 (×13): qty 1

## 2017-07-05 MED ORDER — SPIRONOLACTONE 25 MG PO TABS: 12.5000 mg | ORAL_TABLET | Freq: Two times a day (BID) | ORAL | 0 refills | Status: DC

## 2017-07-05 MED ORDER — DIPHENHYDRAMINE HCL 12.5 MG/5ML PO ELIX: 12.5000 mg | ORAL_SOLUTION | Freq: Four times a day (QID) | ORAL | Status: DC | PRN

## 2017-07-05 MED ORDER — PANTOPRAZOLE SODIUM 40 MG PO TBEC
40.0000 mg | DELAYED_RELEASE_TABLET | Freq: Every day | ORAL | Status: DC
  Administered 2017-07-05 – 2017-07-11 (×7): 40 mg via ORAL
  Filled 2017-07-05 (×6): qty 1

## 2017-07-05 MED ORDER — GUAIFENESIN-DM 100-10 MG/5ML PO SYRP
5.0000 mL | ORAL_SOLUTION | Freq: Four times a day (QID) | ORAL | Status: DC | PRN
  Administered 2017-07-06 – 2017-07-07 (×2): 10 mL via ORAL
  Filled 2017-07-05 (×2): qty 10

## 2017-07-05 MED ORDER — CLONAZEPAM 0.5 MG PO TABS
0.2500 mg | ORAL_TABLET | Freq: Two times a day (BID) | ORAL | Status: DC
  Administered 2017-07-05 – 2017-07-10 (×10): 0.25 mg via ORAL
  Filled 2017-07-05 (×10): qty 1

## 2017-07-05 MED ORDER — INSULIN ASPART 100 UNIT/ML ~~LOC~~ SOLN
0.0000 [IU] | Freq: Three times a day (TID) | SUBCUTANEOUS | Status: DC
  Administered 2017-07-06 – 2017-07-08 (×3): 3 [IU] via SUBCUTANEOUS
  Administered 2017-07-08 – 2017-07-11 (×5): 2 [IU] via SUBCUTANEOUS

## 2017-07-05 MED ORDER — MONTELUKAST SODIUM 10 MG PO TABS
10.0000 mg | ORAL_TABLET | ORAL | Status: DC
Start: 2017-07-06 — End: 2017-07-12
  Administered 2017-07-06 – 2017-07-12 (×6): 10 mg via ORAL
  Filled 2017-07-05 (×6): qty 1

## 2017-07-05 MED ORDER — FLEET ENEMA 7-19 GM/118ML RE ENEM: 1.0000 | ENEMA | Freq: Once | RECTAL | Status: DC | PRN

## 2017-07-05 MED ORDER — CARVEDILOL 3.125 MG PO TABS: 3.1250 mg | ORAL_TABLET | Freq: Two times a day (BID) | ORAL | 0 refills | Status: DC

## 2017-07-05 MED ORDER — PRO-STAT SUGAR FREE PO LIQD
30.0000 mL | Freq: Two times a day (BID) | ORAL | Status: DC
  Administered 2017-07-05 – 2017-07-06 (×2): 30 mL via ORAL
  Filled 2017-07-05 (×2): qty 30

## 2017-07-05 MED ORDER — PROCHLORPERAZINE EDISYLATE 5 MG/ML IJ SOLN: 5.0000 mg | Freq: Four times a day (QID) | INTRAMUSCULAR | Status: DC | PRN

## 2017-07-05 MED ORDER — INSULIN ASPART 100 UNIT/ML ~~LOC~~ SOLN
0.0000 [IU] | Freq: Every day | SUBCUTANEOUS | Status: DC
  Administered 2017-07-07: 2 [IU] via SUBCUTANEOUS

## 2017-07-05 MED ORDER — TRAMADOL HCL 50 MG PO TABS
50.0000 mg | ORAL_TABLET | Freq: Four times a day (QID) | ORAL | Status: DC | PRN
  Administered 2017-07-05 – 2017-07-06 (×4): 50 mg via ORAL
  Filled 2017-07-05 (×4): qty 1

## 2017-07-05 MED ORDER — LOSARTAN POTASSIUM 50 MG PO TABS
50.0000 mg | ORAL_TABLET | Freq: Every day | ORAL | Status: DC
  Administered 2017-07-06 – 2017-07-12 (×7): 50 mg via ORAL
  Filled 2017-07-05 (×7): qty 1

## 2017-07-05 MED ORDER — MENTHOL 3 MG MT LOZG
1.0000 | LOZENGE | OROMUCOSAL | Status: DC | PRN
  Administered 2017-07-05 – 2017-07-07 (×5): 3 mg via ORAL
  Filled 2017-07-05 (×2): qty 9

## 2017-07-05 MED ORDER — BISACODYL 10 MG RE SUPP: 10.0000 mg | Freq: Every day | RECTAL | Status: DC | PRN

## 2017-07-05 MED ORDER — TRAZODONE HCL 50 MG PO TABS
25.0000 mg | ORAL_TABLET | Freq: Every evening | ORAL | Status: DC | PRN
Start: 2017-07-05 — End: 2017-07-12
  Administered 2017-07-05 – 2017-07-09 (×4): 50 mg via ORAL
  Filled 2017-07-05 (×4): qty 1

## 2017-07-05 MED FILL — Cefazolin Sodium-Dextrose IV Solution 2 GM/100ML-4%: INTRAVENOUS | Qty: 100 | Status: AC

## 2017-07-05 MED FILL — Nitroglycerin IV Soln 100 MCG/ML in D5W: INTRA_ARTERIAL | Qty: 10 | Status: AC

## 2017-07-05 MED FILL — Gentamicin Sulfate Inj 40 MG/ML: INTRAMUSCULAR | Qty: 2 | Status: AC

## 2017-07-05 NOTE — Progress Notes (Signed)
Progress Note  Patient Name: Jeffery Thomas Date of Encounter: 07/05/2017  Primary Cardiologist: new to [facility]  Subjective   C/w sore throat only, no CP or SOB.   He verbalizes minimally 2/2 to sore throat though was able to Dr. Rayann Heman this AM  about golfing!  He appears motivated for recovery and wants to get back on the coarse  Inpatient Medications    Scheduled Meds: . amoxicillin-clavulanate  1 tablet Oral Q12H  . carvedilol  3.125 mg Oral BID WC  . clonazePAM  0.5 mg Oral BID  . feeding supplement (ENSURE ENLIVE)  237 mL Oral TID BM  . insulin aspart  0-15 Units Subcutaneous TID WC  . insulin aspart  0-5 Units Subcutaneous QHS  . losartan  50 mg Oral Daily  . pantoprazole  40 mg Oral QHS  . sodium chloride flush  3 mL Intravenous Q12H  . spironolactone  12.5 mg Oral BID   Continuous Infusions: . sodium chloride    .  ceFAZolin (ANCEF) IV 1 g (07/05/17 0616)   PRN Meds: sodium chloride, acetaminophen, HYDROcodone-acetaminophen, menthol-cetylpyridinium, ondansetron (ZOFRAN) IV, sodium chloride flush   Vital Signs    Vitals:   07/04/17 1827 07/04/17 2100 07/05/17 0500 07/05/17 0745  BP:  124/82 (!) 150/88 (!) 131/95  Pulse:  67 66   Resp:  (!) 33 20 20  Temp:  99.4 F (37.4 C) 98.3 F (36.8 C)   TempSrc:  Oral Oral   SpO2: 95% 96% 93%   Weight:      Height:        Intake/Output Summary (Last 24 hours) at 07/05/17 1104 Last data filed at 07/05/17 0107  Gross per 24 hour  Intake               50 ml  Output                0 ml  Net               50 ml   Filed Weights   06/28/17 0352 06/29/17 0313 07/03/17 0558  Weight: 208 lb 8.9 oz (94.6 kg) 216 lb 4.3 oz (98.1 kg) 187 lb 4.8 oz (85 kg)    Telemetry    SR- Personally Reviewed  ECG    No new EKGs - Personally Reviewed  Physical Exam   GEN: No acute distress.   Neck: No JVD, c/w c/o sore throat, though is able to speak Cardiac: RRR, no murmurs, rubs, or gallops.  Respiratory: CTA b/l GI:  Soft, nontender, non-distended  MS: No edema; No deformity. Neuro:  Nonfocal.  He is more interactive today Psych: Normal affect, more engaged today  CHEST: ICD site is stable, dressing is dry, no hematoma  Labs    Chemistry  Recent Labs Lab 07/03/17 0705 07/04/17 0223 07/05/17 0228  NA 140 138 138  K 3.9 3.8 4.1  CL 102 105 105  CO2 26 25 24   GLUCOSE 93 131* 120*  BUN 18 19 18   CREATININE 0.89 0.86 0.84  CALCIUM 8.9 8.5* 8.6*  GFRNONAA >60 >60 >60  GFRAA >60 >60 >60  ANIONGAP 12 8 9      Hematology  Recent Labs Lab 07/02/17 0407 07/03/17 0705 07/04/17 0223  WBC 12.4* 10.7* 11.2*  RBC 4.87 5.38 5.09  HGB 13.7 15.4 14.8  HCT 43.6 47.5 44.3  MCV 89.5 88.3 87.0  MCH 28.1 28.6 29.1  MCHC 31.4 32.4 33.4  RDW 13.9 13.7 13.5  PLT  177 225 244      Radiology    Dg Chest 2 View Result Date: 07/05/2017 CLINICAL DATA:  Status post defibrillator placement EXAM: CHEST  2 VIEW COMPARISON:  06/30/2017 FINDINGS: Cardiac shadow is mildly enlarged. Defibrillator is now seen. No pneumothorax is noted. Patchy changes are seen in the left lung base relatively stable from the prior exam. Left jugular central line has been removed in the interval. IMPRESSION: No pneumothorax following defibrillator placement Electronically Signed   By: Inez Catalina M.D.   On: 07/05/2017 07:45    Cardiac Studies   06/26/17: [facility] IMPRESSION:[name] has normal coronary arteries and severe LV dysfunction in a configuration consistent with "Takatsubo Syndrome". His EF is approximately 20%. He was hemodynamically stable with no complex. He has had no arrhythmias. Potassium was 3.1 and he will be repleted. He will be systemically cooled . Pulmonary critical care medicine is aware. The sheath was removed and pressure held on the groin to achieve hemostasis. The patient left the lab in stable condition.  2-D echocardiogram 06/28/2017: Study Conclusions - Left ventricle: The cavity size was normal.  Wall thickness was increased in a pattern of mild LVH. Systolic function was moderately reduced. The estimated ejection fraction was in the range of 35% to 40%. Diffuse hypokinesis. Doppler parameters are consistent with abnormal left ventricular relaxation (grade 1 diastolic dysfunction). - Aortic valve: There was no stenosis. - Mitral valve: There was no significant regurgitation. - Right ventricle: The cavity size was normal. Systolic function was mildly reduced. - Pulmonary arteries: No complete TR doppler jet so unable to estimate PA systolic pressure. - Systemic veins: The IVC measured 2.2 cm with < 50% respirophasic variation, suggesting RA pressure 15 mmHg. Impressions: - Normal LV size with mild LV hypertrophy. EF 35-40%, diffuse hypokinesis. Normal RV size with mildly decreased systolic function. No significant valvular abnormalities.  Patient Profile     66 y.o. male  hx of HTN, HLD, GERD, s/p cardiac arrest   The patient's wife heard him fall, found him down and unresponsive. EMS was summoned, AED applied and he was shocked. He was then found to be in PEA and got epinephrine. He went into vfib again and was shocked, then pulseless VT and was shocked again. Finally attained perfusing rhythm with sinus tachy after about 20 minutes of CPR and treatment. He underwent emergent cath with no CAD, LVEF 20%, was cooled. 06/28/17 LVEF by echo 35-40%.  06/28/17 rewarming underway, 06/30/17, self extubated, initially quite confused, yesterday delerium felt to be much improved, and transferred out of ICU a couple days ago   Assessment & Plan    1. Witness cardiac arrest at home     Events as noted above     Unknown LVEF pre-event  On coreg, losartan EF 25% by cath on arrival, 35-40% by echo  EKG with narrow QRS (66ms)  Patient and wife are made aware of [place] law, no driving for 6 months  06/27/17 blood cultures negative (x2), 5 days 06/29/17  Blood  cultures (x2) neg 5 days  Now s/p ICD implant Site is stable, no hematoma, dressing is dry CXR this AM without ptx Device check this morning with stable/intact function Wound care and activity restrictions were discussed with the patient/family Patient finally asleep after a restless night, drowsy as we covered instructions, written instructions and daughter at bedside given instructions as well Wound check and EP f/u have been arranged  PLEASE leave tegaderm dressing in place for 2 days,  leave strip-strips in place until and DO NOT GET WET until wound check visit  Activity instructions are included in AVS  2. Pneumonia Klebsiella and staph in tracheal aspirate, s/p zosyn, currently on Augmentin (will complete 07/06/17 per cards note)  3. Encephalopathy     Continued improvement  4. Sore throat     Patient self extubated     Deferred to primary team   Signed, Baldwin Jamaica, PA-C  07/05/2017, 11:04 AM     I have seen, examined the patient, and reviewed the above assessment and plan.  On exam, ICD pocket is without hematoma.  Changes to above are made where necessary.  Device interrogation is reviewed and normal.  CXR reveals stable lead position.  Routine wound care and follow-up  Electrophysiology team to see as needed while here. Please call with questions.   Co Sign: Thompson Grayer, MD 07/05/2017 12:45 PM

## 2017-07-05 NOTE — H&P (Signed)
Physical Medicine and Rehabilitation Admission H&P    Chief Complaint  Patient presents with  . 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     HPI: Jeffery Thomas is a 66 y.o. male with history of HTN, GERD who was admitted on 06/26/17 with PEA cardiac arrest due to V fib/V tach and treated with CPR/ACLS protocol  X 20 minutes with ROSC.  History taken from chart review and family. EKG with inferior STEMI and he was treated with [place] protocol.  Cardiac cath negative for CAD with LVEF < 20% felt to be due to [facility] CM.  CT head reviewed, negative for acute changes and CT cervical spine with multilevel degenerative changes. EEG with diffuse cerebral dysfunction with additional occasional  focal slowing over right temporal region due to anoxic, ischemic, toxic/metabolic or medication effect. Repeat echo 8/22 revealed normal LV with mild LV hypertrophy and EF 35-40% with diffuse hypokinesis.  HCAP due to Kleb Pneumoniae and Staph aureus treated with IV antibiotics and transitioned to augmentin.  Patient self extubated on 8/24 and has had issues with agitation due to delirium. EP consulted for input and ICD placed on 8/28. Follow up CXR without PTX . Therapy evaluations revealed cognitive deficits as well as deficits in mobility therefore CIR recommended for follow up therapy.  Review of Systems  Constitutional: Negative for malaise/fatigue.  HENT: Positive for sore throat. Negative for hearing loss and tinnitus.   Eyes: Negative for blurred vision and double vision.  Respiratory: Negative for cough and shortness of breath.   Cardiovascular: Negative for chest pain and palpitations.  Gastrointestinal: Negative for heartburn and nausea.  Genitourinary: Negative for dysuria.  Musculoskeletal: Negative for back pain, myalgias and neck pain.  Skin: Negative for rash.  Neurological: Negative for dizziness and headaches.  Psychiatric/Behavioral: Positive for memory  loss. The patient has insomnia.   All other systems reviewed and are negative.  Past Medical History:  Diagnosis Date  . Hypertension     Past Surgical History:  Procedure Laterality Date  . ICD IMPLANT N/A 07/04/2017   Procedure: ICD Implant;  Surgeon: Thompson Grayer, MD;  Location: [facility];  Service: Cardiovascular;  Laterality: N/A;  . LEFT HEART CATH AND CORONARY ANGIOGRAPHY N/A 06/26/2017   Procedure: LEFT HEART CATH AND CORONARY ANGIOGRAPHY;  Surgeon: Lorretta Harp, MD;  Location: [facility];  Service: Cardiovascular;  Laterality: N/A;    Family History  Problem Relation Age of Onset  . Hypertension Mother   . Diabetes Father   . Cancer Brother        pancreatic    Social History:  Married. Retired at age 54. He smokes 2 cigars about 6 days a week while golfing. He has never used smokeless tobacco. He drinks a mixed drink and glass of alcohol at nights and beer occasionally. His drug history is not on file.    Allergies: No Known Allergies    Medications Prior to Admission  Medication Sig Dispense Refill  . amLODipine (NORVASC) 10 MG tablet Take 10 mg by mouth daily.    Marland Kitchen atorvastatin (LIPITOR) 40 MG tablet Take 40 mg by mouth daily.    . hydrochlorothiazide (HYDRODIURIL) 25 MG tablet Take 25 mg by mouth daily.    . hydrOXYzine (ATARAX/VISTARIL) 25 MG tablet Take 25 mg by mouth every 6 (six) hours as needed for itching.    . levocetirizine (XYZAL) 5 MG tablet Take 5 mg by mouth every morning.     Marland Kitchen  loperamide (IMODIUM) 2 MG capsule Take 2-4 mg by mouth daily as needed for diarrhea or loose stools.    . metoprolol succinate (TOPROL-XL) 25 MG 24 hr tablet Take 50 mg by mouth daily.    . montelukast (SINGULAIR) 10 MG tablet Take 10 mg by mouth every morning.     Marland Kitchen omeprazole (PRILOSEC) 20 MG capsule Take 40 mg by mouth daily.    . ranitidine (ZANTAC) 150 MG tablet Take 150 mg by mouth 2 (two) times daily.    . [DISCONTINUED] PRESCRIPTION MEDICATION  Steroid dose pack.      Home: Home Living Family/patient expects to be discharged to:: Private residence Living Arrangements: Spouse/significant other Available Help at Discharge: Family, Other (Comment) (can arrange near 24 hour assist) Type of Home: House Home Access: Stairs to enter [employer] of Steps: 2 (2 garage door; 1 steep step front door) Entrance Stairs-Rails: None Home Layout: Two level, Able to live on main level with bedroom/bathroom ("man cave" downstairs) Alternate Level Stairs-Number of Steps: flight Alternate Level Stairs-Rails: Right (descending) Bathroom Shower/Tub: Multimedia programmer: Standard Bathroom Accessibility: Yes  Lives With: Spouse   Functional History: Prior Function Level of Independence: Independent Comments: Futures trader; retired from [employer]:  Mobility: Bed Mobility General bed mobility comments: OOB in chair Transfers Overall transfer level: Needs assistance Equipment used: None Transfers: Sit to/from [place] to Stand: [place] transfer comment:  LOB in standing. Poor awareness of balance issues Ambulation/Gait Ambulation/Gait assistance: +2 safety/equipment, Mod assist, Max assist Ambulation Distance (Feet): 200 Feet Assistive device: Rolling walker (2 wheeled) Gait Pattern/deviations: Step-through pattern, Narrow base of support, Trunk flexed General Gait Details: Used bilateral UE support on RW (despite Mr. Denardo' displeasure with the need for a RW); Tending to keep both hips slightly flexed throughout gait cycle; very narrow step width (like walking a line); noting at least 5 losses of balance requiring mon to mod assist to regain balance; at one point with loss of balance to the L including RW tipping as well -- max assist to prevent fall Gait velocity: varied Gait velocity interpretation: Below normal speed for age/gender    ADL: ADL Overall ADL's : Needs  assistance/impaired Grooming: Standing, Minimal assistance Grooming Details (indicate cue type and reason): a for balance Upper Body Bathing: Set up, Supervision/ safety, Sitting Lower Body Bathing: Minimal assistance, Sit to/from stand Upper Body Dressing : Set up, Supervision/safety, Sitting Lower Body Dressing: Minimal assistance, Sit to/from stand Toilet Transfer: Moderate assistance Toileting- Clothing Manipulation and Hygiene: Minimal assistance, Sit to/from stand Functional mobility during ADLs: Moderate assistance, Cueing for safety (without AD)  Cognition: Cognition Overall Cognitive Status: Impaired/Different from baseline Arousal/Alertness: Awake/alert Orientation Level: Oriented X4, Other (comment) (Implsibe and intermittent confusion) Attention: Focused Focused Attention: Impaired Focused Attention Impairment: Verbal basic, Functional basic Memory: Impaired Memory Impairment: Storage deficit Awareness: Impaired Awareness Impairment: Intellectual impairment Problem Solving: Impaired Problem Solving Impairment: Verbal basic Behaviors: Restless, Impulsive, Perseveration, Poor frustration tolerance Safety/Judgment: Impaired Cognition Arousal/Alertness: Awake/alert Behavior During Therapy: Impulsive Overall Cognitive Status: Impaired/Different from baseline Area of Impairment: Attention, Memory, Safety/judgement, Problem solving, Awareness Current Attention Level: Sustained Memory: Decreased short-term memory (poor STM. ) Safety/Judgement: Decreased awareness of safety, Decreased awareness of deficits Awareness: Emergent Problem Solving: Difficulty sequencing, Slow processing, Requires verbal cues General Comments: Stated Obama is president, is in the hospital becasue he had a small stroke and feels his only problems are with balance; unable to tell me golf course where he lives and  where he lived prior to moving to [facility];    Blood pressure (!) 131/95, pulse 66,  temperature 98.3 F (36.8 C), temperature source Oral, resp. rate 20, height '6\' 2"'  (1.88 m), weight 85 kg (187 lb 4.8 oz), SpO2 93 %. Physical Exam  Nursing note and vitals reviewed. Constitutional: He is oriented to person, place, and time. He appears well-developed and well-nourished. He is uncooperative.  HENT:  Head: Normocephalic and atraumatic.  Mouth/Throat: Oropharynx is clear and moist.  Eyes: Pupils are equal, round, and reactive to light. Conjunctivae and EOM are normal.  Neck: Normal range of motion. Neck supple.  Cardiovascular: Normal rate and regular rhythm.   Respiratory: Effort normal and breath sounds normal. No stridor. No respiratory distress. He has no wheezes.  GI: Soft. Bowel sounds are normal. He exhibits no distension. There is no tenderness.  Musculoskeletal: He exhibits edema (Scrotal). He exhibits no tenderness.  Neurological: He is alert and oriented to person, place, and time.  Soft voice with occasional secretions.  Delayed processing?   Impulsive and inattentive.  Able to follow basic commands. Motor: 4+-5/5 proximal to distal Sensation intact to light touch A&Ox2  Skin: Skin is warm and dry.  Left chest with dry dressing over ICD placement. Foam dressing mid sternal area.   Psychiatric: His affect is inappropriate. His speech is delayed. Cognition and memory are impaired. He expresses impulsivity. He is inattentive.    Results for orders placed or performed during the hospital encounter of 06/26/17 (from the past 48 hour(s))  Glucose, capillary     Status: Abnormal   Collection Time: 07/03/17 11:25 AM  Result Value Ref Range   Glucose-Capillary 105 (H) 65 - 99 mg/dL  Glucose, capillary     Status: Abnormal   Collection Time: 07/03/17  5:38 PM  Result Value Ref Range   Glucose-Capillary 114 (H) 65 - 99 mg/dL  Glucose, capillary     Status: Abnormal   Collection Time: 07/03/17  9:12 PM  Result Value Ref Range   Glucose-Capillary 142 (H) 65 - 99  mg/dL  Magnesium     Status: None   Collection Time: 07/04/17  2:23 AM  Result Value Ref Range   Magnesium 2.1 1.7 - 2.4 mg/dL  Phosphorus     Status: None   Collection Time: 07/04/17  2:23 AM  Result Value Ref Range   Phosphorus 3.5 2.5 - 4.6 mg/dL  Basic metabolic panel     Status: Abnormal   Collection Time: 07/04/17  2:23 AM  Result Value Ref Range   Sodium 138 135 - 145 mmol/L   Potassium 3.8 3.5 - 5.1 mmol/L   Chloride 105 101 - 111 mmol/L   CO2 25 22 - 32 mmol/L   Glucose, Bld 131 (H) 65 - 99 mg/dL   BUN 19 6 - 20 mg/dL   Creatinine, Ser 0.86 0.61 - 1.24 mg/dL   Calcium 8.5 (L) 8.9 - 10.3 mg/dL   GFR calc non Af Amer >60 >60 mL/min   GFR calc Af Amer >60 >60 mL/min    Comment: (NOTE) The eGFR has been calculated using the CKD EPI equation. This calculation has not been validated in all clinical situations. eGFR's persistently <60 mL/min signify possible Chronic Kidney Disease.    Anion gap 8 5 - 15  CBC     Status: Abnormal   Collection Time: 07/04/17  2:23 AM  Result Value Ref Range   WBC 11.2 (H) 4.0 - 10.5 K/uL   RBC 5.09  4.22 - 5.81 MIL/uL   Hemoglobin 14.8 13.0 - 17.0 g/dL   HCT 44.3 39.0 - 52.0 %   MCV 87.0 78.0 - 100.0 fL   MCH 29.1 26.0 - 34.0 pg   MCHC 33.4 30.0 - 36.0 g/dL   RDW 13.5 11.5 - 15.5 %   Platelets 244 150 - 400 K/uL  Glucose, capillary     Status: Abnormal   Collection Time: 07/04/17  7:22 AM  Result Value Ref Range   Glucose-Capillary 111 (H) 65 - 99 mg/dL  Glucose, capillary     Status: Abnormal   Collection Time: 07/04/17 11:25 AM  Result Value Ref Range   Glucose-Capillary 144 (H) 65 - 99 mg/dL  Glucose, capillary     Status: Abnormal   Collection Time: 07/04/17  4:26 PM  Result Value Ref Range   Glucose-Capillary 127 (H) 65 - 99 mg/dL  Glucose, capillary     Status: Abnormal   Collection Time: 07/04/17 10:33 PM  Result Value Ref Range   Glucose-Capillary 157 (H) 65 - 99 mg/dL  Magnesium     Status: None   Collection Time:  07/05/17  2:28 AM  Result Value Ref Range   Magnesium 2.2 1.7 - 2.4 mg/dL  Basic metabolic panel     Status: Abnormal   Collection Time: 07/05/17  2:28 AM  Result Value Ref Range   Sodium 138 135 - 145 mmol/L   Potassium 4.1 3.5 - 5.1 mmol/L   Chloride 105 101 - 111 mmol/L   CO2 24 22 - 32 mmol/L   Glucose, Bld 120 (H) 65 - 99 mg/dL   BUN 18 6 - 20 mg/dL   Creatinine, Ser 0.84 0.61 - 1.24 mg/dL   Calcium 8.6 (L) 8.9 - 10.3 mg/dL   GFR calc non Af Amer >60 >60 mL/min   GFR calc Af Amer >60 >60 mL/min    Comment: (NOTE) The eGFR has been calculated using the CKD EPI equation. This calculation has not been validated in all clinical situations. eGFR's persistently <60 mL/min signify possible Chronic Kidney Disease.    Anion gap 9 5 - 15  Glucose, capillary     Status: Abnormal   Collection Time: 07/05/17  7:42 AM  Result Value Ref Range   Glucose-Capillary 125 (H) 65 - 99 mg/dL   Dg Chest 2 View  Result Date: 07/05/2017 CLINICAL DATA:  Status post defibrillator placement EXAM: CHEST  2 VIEW COMPARISON:  06/30/2017 FINDINGS: Cardiac shadow is mildly enlarged. Defibrillator is now seen. No pneumothorax is noted. Patchy changes are seen in the left lung base relatively stable from the prior exam. Left jugular central line has been removed in the interval. IMPRESSION: No pneumothorax following defibrillator placement Electronically Signed   By: Inez Catalina M.D.   On: 07/05/2017 07:45     Medical Problem List and Plan: 1.  Cognitive deficits as well as deficits in mobility secondary to encephalopathy and debility.  2.  DVT Prophylaxis/Anticoagulation: Pharmaceutical: Lovenox 3. Pain Management: ultram prn for pain 4. Mood: LCSW to follow for evaluation and support.  5. Neuropsych: This patient is not capable of making decisions on his own behalf. 6. Skin/Wound Care: Monitor wound for healing.  7. Fluids/Electrolytes/Nutrition: Monitor I/O. Check lytes in am. Spironolactone added  8/28 8. Kleb/Staph PNA: On agumentin--antibiotic Day # 6/7 9. Witnessed cardiac arrest: NO driving for 6 months per [place] law--family aware.  10. HTN: Monitor BP bid--continue Cozaar and Spironolactone.  11. [place] CM s/p ICD placement: Pacer  precautions. On Cozaar and spironolactone. 12. Leucocytosis: Monitor for signs of infection.  13. Prediabetes: Hgb A1c- 6.3--new diagnosis. RD to educate patient and family on appropriate diet.  14. Encephalopathy: will decrease Klonopin to 0.25 mg bid and wean as able.  15. GERD: On Protonix.   Post Admission Physician Evaluation: 1. Preadmission assessment reviewed and changes made below. 2. Functional deficits secondary  to encephalopathy and debility. 3. Patient is admitted to receive collaborative, interdisciplinary care between the physiatrist, rehab nursing staff, and therapy team. 4. Patient's level of medical complexity and substantial therapy needs in context of that medical necessity cannot be provided at a lesser intensity of care such as a SNF. 5. Patient has experienced substantial functional loss from his/her baseline which was documented above under the "Functional History" and "Functional Status" headings.  Judging by the patient's diagnosis, physical exam, and functional history, the patient has potential for functional progress which will result in measurable gains while on inpatient rehab.  These gains will be of substantial and practical use upon discharge  in facilitating mobility and self-care at the household level. 6. Physiatrist will provide 24 hour management of medical needs as well as oversight of the therapy plan/treatment and provide guidance as appropriate regarding the interaction of the two. 7. 24 hour rehab nursing will assist with safety, disease management, medication administration and patient education  and help integrate therapy concepts, techniques,education, etc. 8. PT will assess and treat for/with: Lower extremity  strength, range of motion, stamina, balance, functional mobility, safety, adaptive techniques and equipment, coping skills, pain control, education.   Goals are: Supervision. 9. OT will assess and treat for/with: ADL's, functional mobility, safety, upper extremity strength, adaptive techniques and equipment, ego support, and community reintegration.   Goals are: Supervision. Therapy may proceed with showering this patient. 10. SLP will assess and treat for/with: cognition.  Goals are: Supervision. 11. Case Management and Social Worker will assess and treat for psychological issues and discharge planning. 12. Team conference will be held weekly to assess progress toward goals and to determine barriers to discharge. 13. Patient will receive at least 3 hours of therapy per day at least 5 days per week. 14. ELOS: 10-14 days.       15. Prognosis:  good  Delice Lesch, MD, [address] [place] 07/05/2017

## 2017-07-05 NOTE — Progress Notes (Signed)
CSW informed patient accepted at [facility]. CSW signing off, as no needs for SNF backup.  Jeffery Thomas, [place]

## 2017-07-05 NOTE — Progress Notes (Signed)
Rehab admissions - I have approval for acute inpatient rehab admission for today.  Bed available, patient and family agreeable, and will admit to inpatient rehab today.  Call me for questions.  #[phone]

## 2017-07-05 NOTE — Progress Notes (Signed)
1800    Pt arrived to room 4W16 via W/C accompanied by wife. Alert, disoriented to day,(Sun.) year (1984), month (Dec.) season, oriented to place, situation ("heart attack." ) and president, some current events. VSS, c/o throat discomfort... Tramadol given, effective. Pt and wife oriented to room  and rehab process.

## 2017-07-05 NOTE — Discharge Instructions (Signed)
° ° °  Supplemental Discharge Instructions for  Pacemaker/Defibrillator Patients  Activity No heavy lifting or vigorous activity with your left/right arm for 6 to 8 weeks.  Do not raise your left/right arm above your head for one week.  Gradually raise your affected arm as drawn below.             07/08/17                       07/09/17                      07/10/17                     07/11/17 __  NO DRIVING for 6 months  WOUND CARE - Keep the wound area clean and dry.  Do not get this area wet, no showers until cleared to at your wound check visit. - The tape/steri-strips on your wound will fall off; do not pull them off.  No bandage is needed on the site.  DO  NOT apply any creams, oils, or ointments to the wound area. - If you notice any drainage or discharge from the wound, any swelling or bruising at the site, or you develop a fever > 101? F after you are discharged home, call the office at once.  Special Instructions - You are still able to use cellular telephones; use the ear opposite the side where you have your pacemaker/defibrillator.  Avoid carrying your cellular phone near your device. - When traveling through airports, show security personnel your identification card to avoid being screened in the metal detectors.  Ask the security personnel to use the hand wand. - Avoid arc welding equipment, MRI testing (magnetic resonance imaging), TENS units (transcutaneous nerve stimulators).  Call the office for questions about other devices. - Avoid electrical appliances that are in poor condition or are not properly grounded. - Microwave ovens are safe to be near or to operate.  Additional information for defibrillator patients should your device go off: - If your device goes off ONCE and you feel fine afterward, notify the device clinic nurses. - If your device goes off ONCE and you do not feel well afterward, call 911. - If your device goes off TWICE, call 911. - If your device goes off  THREE times in one day, call 911.  DO NOT DRIVE YOURSELF OR A FAMILY MEMBER WITH A DEFIBRILLATOR TO THE HOSPITAL--CALL 911.

## 2017-07-05 NOTE — Discharge Summary (Signed)
Discharge Summary    Patient ID: Jeffery Thomas,  MRN: [id number], DOB/AGE: [birth year] 66 y.o.  Admit date: 06/26/2017 Discharge date: 07/05/2017  Primary Care Provider: No primary care provider on file. Primary Cardiologist: Gwenlyn Found  Discharge Diagnoses    Active Problems:   Ventricular fibrillation [facility])   Sudden cardiac death [facility])   Encounter for central line placement   Acute respiratory failure with hypoxia ([place])   Encounter for imaging study to confirm orogastric (OG) tube placement   Abdominal distention   Acute encephalopathy   Bradycardia   Essential hypertension   HCAP (healthcare-associated pneumonia)   Agitation   Gastroesophageal reflux disease   Acute systolic congestive heart failure (HCC)   Takatsuki syndrome   Leukocytosis   Prediabetes   Allergies No Known Allergies  Diagnostic Studies/Procedures    LHC: 06/26/17  IMPRESSION: Jeffery Thomas has normal coronary arteries and severe LV dysfunction in a configuration consistent with "Takatsubo  Syndrome". His EF is approximately 20%. He was hemodynamically stable with no complex. He has had no arrhythmias. Potassium was 3.1 and he will be repleted. He will be systemically cooled . Pulmonary critical care medicine is aware. The sheath was removed and pressure held on the groin to achieve hemostasis. The patient left the lab in stable condition.  Quay Burow. MD, [facility]  TTE: 06/28/17  Study Conclusions  - Left ventricle: The cavity size was normal. Wall thickness was   increased in a pattern of mild LVH. Systolic function was   moderately reduced. The estimated ejection fraction was in the   range of 35% to 40%. Diffuse hypokinesis. Doppler parameters are   consistent with abnormal left ventricular relaxation (grade 1   diastolic dysfunction). - Aortic valve: There was no stenosis. - Mitral valve: There was no significant regurgitation. - Right ventricle: The cavity size was normal. Systolic  function   was mildly reduced. - Pulmonary arteries: No complete TR doppler jet so unable to   estimate PA systolic pressure. - Systemic veins: The IVC measured 2.2 cm with < 50% respirophasic   variation, suggesting RA pressure 15 mmHg.  Impressions:  - Normal LV size with mild LV hypertrophy. EF 35-40%, diffuse   hypokinesis. Normal RV size with mildly decreased systolic   function. No significant valvular abnormalities. _____________   History of Present Illness     Jeffery Thomas is a 66 yo male with no prior cardiac history.  He has treated hypertension, hyperlipidemia, and GERD.  He was in his usual state of health until after dinner the evening of admission.  He reported chest pain to his wife and went back to the bedroom.  She heard him fall, found him down and unresponsive.  EMS was summoned, AED applied and he was shocked.  He was then found to be in PEA and got epinephrine.  He went into vfib again and was shocked, then pulseless VT and was shocked again.  Finally attained perfusing rhythm with sinus tachy after about 20 minutes of CPR and treatment.  In ER, SBP in 130s with HR 100s sinus tachy.  He apparently had purposeful movement after ROSC but was intubated and sedated that the time of exam.    Per wife, he had not had chest pain prior to that evening.  He does not smoke.   PMH: 1. HTN 2. Hyperlipidemia 3. GERD  No past surgical history on file.   Hospital Course     Consultants: PCCM, EP  He underwent emergent  cardiac cath with Dr. Gwenlyn Found that showed no CAD but EF of 20-25%. He was admitted to the unit and underwent hypothermia protocol with PCCM managing. Follow up echo on 06/28/17 showed slightly improved EF at 35-40% with diffuse hypokinesis. EEG showed diffuse background suppression, with CT head negative. He was rewarmed on 8/22. He self extubated on 06/30/17 and was placed on Dante. Noted to have ongoing delirium, that did gradually improve. He was seen by speech  therapy, and began to work with physical therapy. Was transferred out of the unit on 07/02/17. Started on coreg, and losartan with blood pressures tolerating. Weaned from precidex. Treated for HCAP 2/2 Kleb Pneumoniae and Staph aureus by PCCM with zosyn--> Augmentin. Screened for inpatient rehab on 07/02/17. Continued to make progress in regards to neurological status. EP was consulted and seen by Dr. Lovena Le on 07/03/17 with ICD placed on 07/04/17. Assessed by inpatient Rehab MD and accepted. Added spironolactone 07/04/17 with pressures tolerating. He was determined stable for discharge to [facility] by EP and Dr. Claiborne Billings on 07/05/17. Follow up for ICD has been arranged. Will plan to arrange follow up in the office with Dr. Gwenlyn Found once discharge from [facility].   General: Well developed, well nourished, male appearing in no acute distress. Head: Normocephalic, atraumatic.  Neck: Supple without bruits, JVD. Lungs:  Resp regular and unlabored, CTA. Heart: RRR, S1, S2, no S3, S4, or murmur; no rub. Abdomen: Soft, non-tender, non-distended with normoactive bowel sounds. No hepatomegaly. No rebound/guarding. No obvious abdominal masses. Extremities: No clubbing, cyanosis, edema. Distal pedal pulses are 2+ bilaterally.  Neuro: Alert and oriented X 3. Moves all extremities spontaneously. Psych: Normal affect.  _____________  Discharge Vitals Blood pressure (!) 131/95, pulse 66, temperature 98.3 F (36.8 C), temperature source Oral, resp. rate 20, height 6\' 2"  (1.88 m), weight 187 lb 4.8 oz (85 kg), SpO2 93 %.  Filed Weights   06/28/17 0352 06/29/17 0313 07/03/17 0558  Weight: 208 lb 8.9 oz (94.6 kg) 216 lb 4.3 oz (98.1 kg) 187 lb 4.8 oz (85 kg)    Labs & Radiologic Studies    CBC  Recent Labs  07/03/17 0705 07/04/17 0223  WBC 10.7* 11.2*  HGB 15.4 14.8  HCT 47.5 44.3  MCV 88.3 87.0  PLT 225 338   Basic Metabolic Panel  Recent Labs  07/03/17 0705 07/04/17 0223 07/05/17 0228  NA 140 138 138  K 3.9 3.8  4.1  CL 102 105 105  CO2 26 25 24   GLUCOSE 93 131* 120*  BUN 18 19 18   CREATININE 0.89 0.86 0.84  CALCIUM 8.9 8.5* 8.6*  MG 2.2 2.1 2.2  PHOS 3.9 3.5  --    Liver Function Tests No results for input(s): AST, ALT, ALKPHOS, BILITOT, PROT, ALBUMIN in the last 72 hours. No results for input(s): LIPASE, AMYLASE in the last 72 hours. Cardiac Enzymes No results for input(s): CKTOTAL, CKMB, CKMBINDEX, TROPONINI in the last 72 hours. BNP Invalid input(s): POCBNP D-Dimer No results for input(s): DDIMER in the last 72 hours. Hemoglobin A1C  Recent Labs  07/02/17 1814  HGBA1C 6.3*   Fasting Lipid Panel No results for input(s): CHOL, HDL, LDLCALC, TRIG, CHOLHDL, LDLDIRECT in the last 72 hours. Thyroid Function Tests No results for input(s): TSH, T4TOTAL, T3FREE, THYROIDAB in the last 72 hours.  Invalid input(s): FREET3 _____________  Dg Chest 2 View  Result Date: 07/05/2017 CLINICAL DATA:  Status post defibrillator placement EXAM: CHEST  2 VIEW COMPARISON:  06/30/2017 FINDINGS: Cardiac shadow is mildly  enlarged. Defibrillator is now seen. No pneumothorax is noted. Patchy changes are seen in the left lung base relatively stable from the prior exam. Left jugular central line has been removed in the interval. IMPRESSION: No pneumothorax following defibrillator placement Electronically Signed   By: Inez Catalina M.D.   On: 07/05/2017 07:45   Dg Abd 1 View  Result Date: 06/29/2017 CLINICAL DATA:  Abdominal distention EXAM: ABDOMEN - 1 VIEW COMPARISON:  06/27/2017 FINDINGS: NG tube tip is in the mid to distal stomach. Nonobstructive bowel gas pattern. No organomegaly or evidence of free air. IMPRESSION: NG tube tip in the mid to distal stomach. Nonobstructive bowel gas pattern. Electronically Signed   By: Rolm Baptise M.D.   On: 06/29/2017 08:58   Ct Head Wo Contrast  Result Date: 06/28/2017 CLINICAL DATA:  Cardiac arrest August 20th, ACLS time 20 minutes. EXAM: CT HEAD WITHOUT CONTRAST CT  CERVICAL SPINE WITHOUT CONTRAST TECHNIQUE: Multidetector CT imaging of the head and cervical spine was performed following the standard protocol without intravenous contrast. Multiplanar CT image reconstructions of the cervical spine were also generated. COMPARISON:  None. FINDINGS: CT HEAD FINDINGS BRAIN: No intraparenchymal hemorrhage, mass effect nor midline shift. The ventricles and sulci are normal for age. No acute large vascular territory infarcts. No abnormal extra-axial fluid collections. Basal cisterns are patent. VASCULAR: Mild calcific atherosclerosis of the carotid siphons. SKULL: No skull fracture. No significant scalp soft tissue swelling. SINUSES/ORBITS: The mastoid air-cells and included paranasal sinuses are well-aerated.Status post LEFT ocular lens implant. Bilateral scleral calcifications. OTHER: Life-support lines in place. CT CERVICAL SPINE FINDINGS ALIGNMENT: Maintained lordosis. Vertebral bodies in alignment. Mild S-type scoliosis. SKULL BASE AND VERTEBRAE: Cervical vertebral bodies and posterior elements are intact. 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 Severe C5-6 disc height loss, vacuum disc and endplate spurring. SOFT TISSUES AND SPINAL CANAL: Nonacute. Moderate calcific atherosclerosis RIGHT greater than LEFT carotid bulbs. DISC LEVELS: Mild canal stenosis C4-5 and C5-6. Mild LEFT C4-5, severe LEFT and mild RIGHT C5-6, mild LEFT C7-T1 neural foraminal narrowing. UPPER CHEST: Lung apices are clear. OTHER: None. IMPRESSION: CT HEAD: Negative noncontrast CT HEAD for age. CT CERVICAL SPINE: 1. No acute fracture or malalignment. 2. Multilevel segmentation anomaly. Electronically Signed   By: Elon Alas M.D.   On: 06/28/2017 18:35   Ct Cervical Spine Wo Contrast  Result Date: 06/28/2017 CLINICAL DATA:  Cardiac arrest August 20th, ACLS time 20 minutes. EXAM: CT HEAD WITHOUT CONTRAST CT CERVICAL SPINE WITHOUT CONTRAST TECHNIQUE: Multidetector CT imaging of the head and cervical spine  was performed following the standard protocol without intravenous contrast. Multiplanar CT image reconstructions of the cervical spine were also generated. COMPARISON:  None. FINDINGS: CT HEAD FINDINGS BRAIN: No intraparenchymal hemorrhage, mass effect nor midline shift. The ventricles and sulci are normal for age. No acute large vascular territory infarcts. No abnormal extra-axial fluid collections. Basal cisterns are patent. VASCULAR: Mild calcific atherosclerosis of the carotid siphons. SKULL: No skull fracture. No significant scalp soft tissue swelling. SINUSES/ORBITS: The mastoid air-cells and included paranasal sinuses are well-aerated.Status post LEFT ocular lens implant. Bilateral scleral calcifications. OTHER: Life-support lines in place. CT CERVICAL SPINE FINDINGS ALIGNMENT: Maintained lordosis. Vertebral bodies in alignment. Mild S-type scoliosis. SKULL BASE AND VERTEBRAE: Cervical vertebral bodies and posterior elements are intact. 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 Severe C5-6 disc height loss, vacuum disc and endplate spurring. SOFT TISSUES AND SPINAL CANAL: Nonacute. Moderate calcific atherosclerosis RIGHT greater than LEFT carotid bulbs. DISC LEVELS: Mild canal stenosis C4-5 and C5-6. Mild  LEFT C4-5, severe LEFT and mild RIGHT C5-6, mild LEFT C7-T1 neural foraminal narrowing. UPPER CHEST: Lung apices are clear. OTHER: None. IMPRESSION: CT HEAD: Negative noncontrast CT HEAD for age. CT CERVICAL SPINE: 1. No acute fracture or malalignment. 2. Multilevel segmentation anomaly. Electronically Signed   By: Elon Alas M.D.   On: 06/28/2017 18:35   Dg Chest Port 1 View  Result Date: 06/30/2017 CLINICAL DATA:  Extubation EXAM: PORTABLE CHEST 1 VIEW COMPARISON:  06/29/2017. FINDINGS: Interim extubation and removal of NG tube. Left IJ line stable position. Stable cardiomegaly. Improved aeration of both lungs. Partial clearing of bilateral pulmonary infiltrates/edema. No pleural effusion or  pneumothorax . IMPRESSION: 1. Interim extubation and removal of NG tube. Left IJ line in stable position. 2. Improved aeration of both lungs. Partial clearing of bilateral pulmonary infiltrates/edema. 3. Stable cardiomegaly . Electronically Signed   By: Marcello Moores  Register   On: 06/30/2017 07:43   Dg Chest Port 1 View  Result Date: 06/29/2017 CLINICAL DATA:  Respiratory failure, intubated patient, status post 7 cardiac arrest. EXAM: PORTABLE CHEST 1 VIEW COMPARISON:  Chest x-ray of June 27, 2017 FINDINGS: The lungs are reasonably well inflated. There are coarse alveolar infiltrates in the mid and lower lungs bilaterally more conspicuous today. The retrocardiac region on the left is dense. There is no pneumothorax or significant pleural effusion. The esophagogastric tube tip in proximal port project below the inferior margin of the image. A left internal jugular venous catheter tip projects over the distal third of the SVC. The endotracheal tube tip lies approximately 3.9 cm above the carina. IMPRESSION: Mild interval worsening in the appearance of the mid and lower lungs bilaterally. This may reflect alveolar edema but aspiration pneumonia is felt likely. Electronically Signed   By: David  Martinique M.D.   On: 06/29/2017 07:27   Dg Chest Port 1 View  Result Date: 06/27/2017 CLINICAL DATA:  Acute respiratory failure with hypoxia EXAM: PORTABLE CHEST 1 VIEW COMPARISON:  06/27/2017 FINDINGS: Endotracheal tube has been advanced with the tip 4-5 cm above the carina. NG tube and left central line remain in place, unchanged. Cardiomegaly with vascular congestion. Bilateral lower lobe atelectasis or infiltrates, increased since prior study. IMPRESSION: Cardiomegaly, vascular congestion. Increasing bibasilar atelectasis or infiltrates. Electronically Signed   By: Rolm Baptise M.D.   On: 06/27/2017 08:43   Dg Chest Port 1 View  Result Date: 06/27/2017 CLINICAL DATA:  Central line placement EXAM: PORTABLE CHEST 1 VIEW  COMPARISON:  06/26/2017 FINDINGS: New left IJ central line catheter tip is seen at the cavoatrial junction without pneumothorax. Low lung volumes with interstitial edema and cardiomegaly. Tortuous thoracic aorta. Endotracheal tube tip is approximately 8.5 cm above the carina. Gastric tube extends below the left hemidiaphragm. A pH probe is seen in the distal esophagus. There appear to be ice packs project over the base of the neck and right arm. IMPRESSION: 1. Left IJ central line catheter at the cavoatrial junction. No pneumothorax. 2. Satisfactory support line and tube positions. 3. Stable cardiomegaly with mild interstitial edema. 4. Aortic atherosclerosis. Electronically Signed   By: Ashley Royalty M.D.   On: 06/27/2017 01:02   Dg Chest Portable 1 View  Result Date: 06/26/2017 CLINICAL DATA:  66 y/o  M; post CPR and intubation. EXAM: PORTABLE CHEST 1 VIEW COMPARISON:  None. FINDINGS: Mildly enlarged cardiac silhouette given projection and technique. Endotracheal tube tip is 9.5 cm from the carina. Pulmonary vascular congestion. No pneumothorax. The no focal consolidation. No acute osseous abnormality  is evident. IMPRESSION: Mildly enlarged cardiac silhouette. Pulmonary vascular congestion. Endotracheal tube tip 9.5 cm from carina, consider advancement. Electronically Signed   By: Kristine Garbe M.D.   On: 06/26/2017 22:11   Dg Abd Portable 1v  Result Date: 06/27/2017 CLINICAL DATA:  Check gastric catheter placement EXAM: PORTABLE ABDOMEN - 1 VIEW COMPARISON:  None. FINDINGS: Scattered large and small bowel gas is noted. A gastric catheter is noted within the stomach. No acute bony abnormality is seen. No mass lesion is noted. IMPRESSION: Gastric catheter within the stomach. Electronically Signed   By: Inez Catalina M.D.   On: 06/27/2017 07:42   Disposition   Pt is being discharged home today in good condition.  Follow-up Plans & Appointments    Follow-up Information    [place] [facility] [place] [facility] Follow up on 07/14/2017.   Specialty:  Cardiology Why:  11:00AM, wound check visit Contact information: [address], [place]       Thompson Grayer, MD Follow up on 10/09/2017.   Specialty:  Cardiology Why:  12:00PM (noon) Contact information: [place] [address] [phone]        Lorretta Harp, MD Follow up.   Specialties:  Cardiology, Radiology Why:  Will arrange follow up once discharged from inpatient rehab. Contact information: [address] [place] [address] [phone]          Discharge Instructions    (HEART FAILURE PATIENTS) Call MD:  Anytime you have any of the following symptoms: 1) 3 pound weight gain in 24 hours or 5 pounds in 1 week 2) shortness of breath, with or without a dry hacking cough 3) swelling in the hands, feet or stomach 4) if you have to sleep on extra pillows at night in order to breathe.    Complete by:  As directed    AMB Referral to Cardiac Rehabilitation - Phase II    Complete by:  As directed    Diagnosis:  STEMI   Diet - low sodium heart healthy    Complete by:  As directed    Discharge instructions    Complete by:  As directed    For patients with congestive heart failure, we give them these special instructions:  1. Follow a low-salt diet and watch your fluid intake. In general, you should not be taking in more than 2 liters of fluid per day (no more than 8 glasses per day). Some patients are restricted to less than 1.5 liters of fluid per day (no more than 6 glasses per day). This includes sources of water in foods like soup, coffee, tea, milk, etc. 2. Weigh yourself on the same scale at same time of day and keep a log. 3. Call your doctor: (Anytime you feel any of the following symptoms)  - 3-4 pound weight gain in 1-2 days or 2 pounds overnight  - Shortness of breath, with or without a dry hacking cough  -  Swelling in the hands, feet or stomach  - If you have to sleep on extra pillows at night in order to breathe   IT IS IMPORTANT TO LET YOUR DOCTOR KNOW EARLY ON IF YOU ARE HAVING SYMPTOMS SO WE CAN HELP YOU!   Increase activity slowly    Complete by:  As directed       Discharge Medications   Current Discharge Medication List    START taking these medications  Details  amoxicillin-clavulanate (AUGMENTIN) 875-125 MG tablet Take 1 tablet by mouth every 12 (twelve) hours. Qty: 3 tablet, Refills: 0    carvedilol (COREG) 3.125 MG tablet Take 1 tablet (3.125 mg total) by mouth 2 (two) times daily with a meal. Qty: 60 tablet, Refills: 0    losartan (COZAAR) 50 MG tablet Take 1.5 tablets (75 mg total) by mouth daily. Qty: 60 tablet, Refills: 0    spironolactone (ALDACTONE) 25 MG tablet Take 0.5 tablets (12.5 mg total) by mouth 2 (two) times daily. Qty: 60 tablet, Refills: 0      CONTINUE these medications which have NOT CHANGED   Details  atorvastatin (LIPITOR) 40 MG tablet Take 40 mg by mouth daily.    hydrOXYzine (ATARAX/VISTARIL) 25 MG tablet Take 25 mg by mouth every 6 (six) hours as needed for itching.    levocetirizine (XYZAL) 5 MG tablet Take 5 mg by mouth every morning.     loperamide (IMODIUM) 2 MG capsule Take 2-4 mg by mouth daily as needed for diarrhea or loose stools.    montelukast (SINGULAIR) 10 MG tablet Take 10 mg by mouth every morning.     omeprazole (PRILOSEC) 20 MG capsule Take 40 mg by mouth daily.    ranitidine (ZANTAC) 150 MG tablet Take 150 mg by mouth 2 (two) times daily.      STOP taking these medications     amLODipine (NORVASC) 10 MG tablet      hydrochlorothiazide (HYDRODIURIL) 25 MG tablet      metoprolol succinate (TOPROL-XL) 25 MG 24 hr tablet           Outstanding Labs/Studies   Bmet in one week given addition of spironolactone  Duration of Discharge Encounter   Greater than 30 minutes including physician  time.  Signed, Reino Bellis NP-C 07/05/2017, 2:32 PM

## 2017-07-06 ENCOUNTER — Inpatient Hospital Stay (HOSPITAL_COMMUNITY): Admitting: Speech Pathology

## 2017-07-06 ENCOUNTER — Inpatient Hospital Stay (HOSPITAL_COMMUNITY): Admitting: Occupational Therapy

## 2017-07-06 ENCOUNTER — Encounter (HOSPITAL_COMMUNITY): Payer: Self-pay

## 2017-07-06 ENCOUNTER — Inpatient Hospital Stay (HOSPITAL_COMMUNITY): Admitting: Physical Therapy

## 2017-07-06 DIAGNOSIS — G931 Anoxic brain damage, not elsewhere classified: Secondary | ICD-10-CM

## 2017-07-06 DIAGNOSIS — I6782 Cerebral ischemia: Secondary | ICD-10-CM

## 2017-07-06 DIAGNOSIS — J189 Pneumonia, unspecified organism: Secondary | ICD-10-CM

## 2017-07-06 LAB — CBC WITH DIFFERENTIAL/PLATELET
BASOS ABS: 0 10*3/uL (ref 0.0–0.1)
Basophils Relative: 0 %
Eosinophils Absolute: 0.1 10*3/uL (ref 0.0–0.7)
Eosinophils Relative: 1 %
HEMATOCRIT: 46.3 % (ref 39.0–52.0)
Hemoglobin: 14.6 g/dL (ref 13.0–17.0)
LYMPHS ABS: 1.5 10*3/uL (ref 0.7–4.0)
LYMPHS PCT: 15 %
MCH: 28.1 pg (ref 26.0–34.0)
MCHC: 31.5 g/dL (ref 30.0–36.0)
MCV: 89 fL (ref 78.0–100.0)
MONO ABS: 0.5 10*3/uL (ref 0.1–1.0)
Monocytes Relative: 5 %
NEUTROS ABS: 8 10*3/uL — AB (ref 1.7–7.7)
Neutrophils Relative %: 79 %
Platelets: 291 10*3/uL (ref 150–400)
RBC: 5.2 MIL/uL (ref 4.22–5.81)
RDW: 13.5 % (ref 11.5–15.5)
WBC: 10 10*3/uL (ref 4.0–10.5)

## 2017-07-06 LAB — GLUCOSE, CAPILLARY
GLUCOSE-CAPILLARY: 145 mg/dL — AB (ref 65–99)
Glucose-Capillary: 161 mg/dL — ABNORMAL HIGH (ref 65–99)
Glucose-Capillary: 104 mg/dL — ABNORMAL HIGH (ref 65–99)
Glucose-Capillary: 190 mg/dL — ABNORMAL HIGH (ref 65–99)

## 2017-07-06 LAB — COMPREHENSIVE METABOLIC PANEL
ALT: 36 U/L (ref 17–63)
AST: 28 U/L (ref 15–41)
Albumin: 2.6 g/dL — ABNORMAL LOW (ref 3.5–5.0)
Alkaline Phosphatase: 63 U/L (ref 38–126)
Anion gap: 10 (ref 5–15)
BILIRUBIN TOTAL: 1.1 mg/dL (ref 0.3–1.2)
BUN: 18 mg/dL (ref 6–20)
CALCIUM: 8.7 mg/dL — AB (ref 8.9–10.3)
CO2: 26 mmol/L (ref 22–32)
CREATININE: 1.02 mg/dL (ref 0.61–1.24)
Chloride: 103 mmol/L (ref 101–111)
GFR calc Af Amer: >60 mL/min (ref >60)
Glucose, Bld: 153 mg/dL — ABNORMAL HIGH (ref 65–99)
Potassium: 4.4 mmol/L (ref 3.5–5.1)
Sodium: 139 mmol/L (ref 135–145)
TOTAL PROTEIN: 6.3 g/dL — AB (ref 6.5–8.1)

## 2017-07-06 LAB — MAGNESIUM: MAGNESIUM: 2.1 mg/dL (ref 1.7–2.4)

## 2017-07-06 MED ORDER — ENSURE ENLIVE PO LIQD
237.0000 mL | Freq: Two times a day (BID) | ORAL | Status: DC
  Administered 2017-07-06 – 2017-07-11 (×12): 237 mL via ORAL

## 2017-07-06 NOTE — Evaluation (Signed)
Occupational Therapy Assessment and Plan  Patient Details  Name: Jeffery Thomas MRN: [id number] Date of Birth: 1951-03-23  OT Diagnosis: cognitive deficits and muscle weakness (generalized) Rehab Potential: Rehab Potential (ACUTE ONLY): Good ELOS: 5-7 days   Today's Date: 07/06/2017 OT Individual Time: 0900-1000 OT Individual Time Calculation (min): 60 min     Problem List:  Patient Active Problem List   Diagnosis Date Noted  . Anoxic-ischemic encephalopathy ([place]) 07/05/2017  . Cardiac device in situ   . Gastroesophageal reflux disease   . Acute systolic congestive heart failure ([place])   . Takatsuki syndrome   . Leukocytosis   . Prediabetes   . HCAP (healthcare-associated pneumonia)   . Agitation   . Acute encephalopathy   . Bradycardia   . Essential hypertension   . Abdominal distention   . Encounter for imaging study to confirm orogastric (OG) tube placement   . Acute respiratory failure with hypoxia ([place])   . Encounter for central line placement   . Ventricular fibrillation ([facility] [place]) 2017/07/15  . Sudden cardiac death ([place]) 07-15-2017  . ST elevation myocardial infarction (STEMI) [facility])     Past Medical History:  Past Medical History:  Diagnosis Date  . Hypertension    Past Surgical History:  Past Surgical History:  Procedure Laterality Date  . ICD IMPLANT N/A 07/04/2017   Procedure: ICD Implant;  Surgeon: Thompson Grayer, MD;  Location: [place] CV LAB;  Service: Cardiovascular;  Laterality: N/A;  . LEFT HEART CATH AND CORONARY ANGIOGRAPHY N/A 07-15-17   Procedure: LEFT HEART CATH AND CORONARY ANGIOGRAPHY;  Surgeon: Lorretta Harp, MD;  Location: [place] CV LAB;  Service: Cardiovascular;  Laterality: N/A;    Assessment & Plan Clinical Impression: Patient is a 66 y.o. year old male with history of HTN, GERD who was admitted on Jul 15, 2017 with PEA cardiac arrest due to V fib/V tach and treated with CPR/ACLS protocol X 20 minutes with ROSC. History taken from  chart review and family. EKG with inferior STEMI and he was treated with Artic Sun protocol. Cardiac cath negative for CAD with LVEF <20% felt to be due to [facility] CM. CT head reviewed, negative for acute changes and CT cervical spine with multilevel degenerative changes. EEG with diffuse cerebral dysfunction with additional occasional focal slowing over right temporal region due to anoxic, ischemic, toxic/metabolic or medication effect. Repeat echo 8/22 revealed normal LV with mild LV hypertrophy and EF 35-40% with diffuse hypokinesis. HCAP due to Kleb Pneumoniae and Staph aureus treated with IV antibiotics and transitioned to augmentin. Patient self extubated on 8/24 and has had issues with agitation due to delirium. EP consulted for input and ICD placed on 8/28. Follow up CXR without PTX . Therapy evaluations revealed cognitive deficits as well as deficits in mobility therefore CIR recommended for follow up therapy.Patient transferred to CIR on 07/05/2017 .    Patient currently requires min with basic self-care skills and IADL secondary to muscle weakness, decreased cardiorespiratoy endurance, decreased attention, decreased awareness, decreased problem solving, decreased safety awareness and decreased memory and decreased standing balance and decreased balance strategies.  Prior to hospitalization, patient could complete ADLs and IADLs with independent .  Patient will benefit from skilled intervention to increase independence with basic self-care skills prior to discharge home with care partner.  Anticipate patient will require 24 hour supervision and follow up outpatient.  OT - End of Session Activity Tolerance: Decreased this session Endurance Deficit: Yes Endurance Deficit Description: 2/2 decreased cardiovascular endurance OT Assessment Rehab  Potential (ACUTE ONLY): Good OT Barriers to Discharge: Decreased caregiver support OT Patient demonstrates impairments in the following area(s):  Balance;Cognition;Endurance;Safety OT Basic ADL's Functional Problem(s): Grooming;Bathing;Dressing;Toileting OT Advanced ADL's Functional Problem(s): Simple Meal Preparation;Laundry OT Transfers Functional Problem(s): Toilet;Tub/Shower OT Additional Impairment(s): None OT Plan OT Intensity: Minimum of 1-2 x/day, 45 to 90 minutes OT Frequency: 5 out of 7 days OT Duration/Estimated Length of Stay: 5-7 days OT Treatment/Interventions: Balance/vestibular training;Cognitive remediation/compensation;Community reintegration;Discharge planning;Functional mobility training;Psychosocial support;Therapeutic Activities;UE/LE Coordination activities;Patient/family education;DME/adaptive equipment instruction;Pain management;UE/LE Strength taining/ROM;Therapeutic Exercise;Self Care/advanced ADL retraining OT Self Feeding Anticipated Outcome(s): n/a OT Basic Self-Care Anticipated Outcome(s): Supervision OT Toileting Anticipated Outcome(s): Supervision OT Bathroom Transfers Anticipated Outcome(s): Supervision OT Recommendation Recommendations for Other Services: Therapeutic Recreation consult;Neuropsych consult Therapeutic Recreation Interventions: Kitchen group;Pet therapy Patient destination: Home Follow Up Recommendations: Outpatient OT;24 hour supervision/assistance Equipment Recommended: To be determined   Skilled Therapeutic Intervention Upon entering the room, pt supine in bed with wife present in room and with no c/o pain. OT educated pt and caregiver on OT purpose, POC, and goals with need for 24/7 supervision for safety returning home. Caregiver and pt verbalized understanding and agreement. Pt ambulating without use of RW with min A into bathroom for toileting. Pt required min A for balance to remove clothing and mod cues for safety awareness. Pt seated on TTB for bathing at shower level with increased time and mod cues for initiation and sequencing of tasks. Pt standing with use of grab bar and  washing buttocks with min A balance. Pt dressing self while seated on EOB with min A for balance. Pt ambulating 200' on unit with min assist without use of AD but occasional LOB requiring min - mod A to correct. Pt returning to room with chair alarm and quick release belt donned. Call bell within reach upon exiting the room.   OT Evaluation Precautions/Restrictions  Precautions Precautions: Fall Precaution Comments: monitor HR  Pain Pain Assessment Pain Assessment: No/denies pain Home Living/Prior Functioning Home Living Family/patient expects to be discharged to:: Private residence Living Arrangements: Spouse/significant other Available Help at Discharge: Family, Available PRN/intermittently Type of Home: House Home Access: Stairs to enter Technical brewer of Steps: 2 Entrance Stairs-Rails: None Home Layout: One level, Laundry or work area in basement [employer] Shower/Tub: Gaffer  Lives With: Spouse Prior Function Level of Independence: Independent with basic ADLs, Independent with transfers, Independent with gait, Independent with homemaking with ambulation  Able to Take Stairs?: Yes Driving: Yes Vocation: Retired Biomedical scientist: retired from [employer]: Hobbies-yes (Comment) Comments: golf Vision Baseline Vision/History: Wears glasses Wears Glasses: Reading only Patient Visual Report: No change from baseline Vision Assessment?: No apparent visual deficits Cognition Overall Cognitive Status: Impaired/Different from baseline Arousal/Alertness: Awake/alert Orientation Level: Person;Place Year: Other (Comment) (2011) Month: August Day of Week: Other (Comment) (friday) Memory: Impaired Memory Impairment: Decreased long term memory;Decreased short term memory;Decreased recall of new information Immediate Memory Recall: Sock;Blue;Bed Memory Recall: Sock (1/3) Memory Recall Sock: Without Cue Awareness: Impaired Awareness Impairment: Intellectual  impairment Problem Solving: Impaired Behaviors: Impulsive Safety/Judgment: Impaired Sensation Sensation Light Touch: Appears Intact Stereognosis: Not tested Hot/Cold: Appears Intact Proprioception: Appears Intact Motor  Motor Motor - Skilled Clinical Observations: general weakness Mobility  Bed Mobility Bed Mobility: Rolling Right;Rolling Left;Supine to Sit;Sit to Supine Rolling Right: 6: Modified independent (Device/Increase time) Rolling Left: 6: Modified independent (Device/Increase time) Supine to Sit: 6: Modified independent (Device/Increase time) Sit to Supine: 6: Modified independent (Device/Increase time) Transfers Sit to Stand: 4: Min guard  Trunk/Postural  Assessment  Cervical Assessment Cervical Assessment: Within Functional Limits Thoracic Assessment Thoracic Assessment: Within Functional Limits Lumbar Assessment Lumbar Assessment: Within Functional Limits Postural Control Postural Control: Deficits on evaluation  Balance Balance Balance Assessed: Yes Standardized Balance Assessment Standardized Balance Assessment: Berg Balance Test Berg Balance Test Sit to Stand: Able to stand without using hands and stabilize independently Standing Unsupported: Able to stand 30 seconds unsupported (able to maintain balance for 90 seconds) Sitting with Back Unsupported but Feet Supported on Floor or Stool: Able to sit 2 minutes under supervision Stand to Sit: Sits safely with minimal use of hands Transfers: Able to transfer with verbal cueing and /or supervision Standing Unsupported with Eyes Closed: Able to stand 10 seconds with supervision Standing Ubsupported with Feet Together: Able to place feet together independently but unable to hold for 30 seconds (able to place feet together but only able to hold for 35 seconds) From Standing, Reach Forward with Outstretched Arm: Reaches forward but needs supervision From Standing Position, Pick up Object from Floor: Able to pick up  shoe, needs supervision From Standing Position, Turn to Look Behind Over each Shoulder: Looks behind from both sides and weight shifts well Turn 360 Degrees: Needs close supervision or verbal cueing (completes full turns to both directions) Standing Unsupported, Alternately Place Feet on Step/Stool: Able to complete >2 steps/needs minimal assist Standing Unsupported, One Foot in Front: Able to take small step independently and hold 30 seconds Standing on One Leg: Unable to try or needs assist to prevent fall (holds 3 seconds but then requires assistance to prevent LOB) Total Score: 32 Extremity/Trunk Assessment RUE Assessment RUE Assessment: Within Functional Limits LUE Assessment LUE Assessment: Within Functional Limits   See Function Navigator for Current Functional Status.   Refer to Care Plan for Long Term Goals  Recommendations for other services: Neuropsych and Therapeutic Recreation  Kitchen group   Discharge Criteria: Patient will be discharged from OT if patient refuses treatment 3 consecutive times without medical reason, if treatment goals not met, if there is a change in medical status, if patient makes no progress towards goals or if patient is discharged from hospital.  The above assessment, treatment plan, treatment alternatives and goals were discussed and mutually agreed upon: by patient  Gypsy Decant 07/06/2017, 12:33 PM

## 2017-07-06 NOTE — Progress Notes (Signed)
Inpatient Rehabilitation Center Individual Statement of Services  Patient Name:  Jeffery Thomas  Date:  07/06/2017  Welcome to the [facility].  Our goal is to provide you with an individualized program based on your diagnosis and situation, designed to meet your specific needs.  With this comprehensive rehabilitation program, you will be expected to participate in at least 3 hours of rehabilitation therapies Monday-Friday, with modified therapy programming on the weekends.  Your rehabilitation program will include the following services:  Physical Therapy (PT), Occupational Therapy (OT), Speech Therapy (ST), 24 hour per day rehabilitation nursing, Case Management (Social Worker), Rehabilitation Medicine, Nutrition Services and Pharmacy Services  Weekly team conferences will be held on Tuesdays to discuss your progress.  Your Social Worker will talk with you frequently to get your input and to update you on team discussions.  Team conferences with you and your family in attendance may also be held.  Expected length of stay:  5 to 7 days  Overall anticipated outcome:  Supervision  Depending on your progress and recovery, your program may change. Your Social Worker will coordinate services and will keep you informed of any changes. Your Social Worker's name and contact numbers are listed  below.  The following services may also be recommended but are not provided by the [facility] will be made to provide these services after discharge if needed.  Arrangements include referral to agencies that provide these services.  Your insurance has been verified to be:  Tricare and [employer] (supplemental insurance from [employer]) Your primary doctor is:  Dr. Bernerd Limbo  Pertinent information will be shared with your doctor and your insurance  company.  Social Worker:  Alfonse Alpers, LCSW  [phone] or ([phone]  Information discussed with and copy given to patient by: Trey Sailors, 07/06/2017, 2:03 PM

## 2017-07-06 NOTE — Progress Notes (Signed)
Nutrition Consult  DOCUMENTATION CODES:   Non-severe (moderate) malnutrition in context of acute illness/injury  INTERVENTION:    Ensure Enlive po TID, each supplement provides 350 kcal and 20 grams of protein  RD to provide diet education closer to discharge home.  NUTRITION DIAGNOSIS:   Malnutrition (moderate) related to acute illness (recent cardiac arrest) as evidenced by mild depletion of muscle mass, percent weight loss (10% weight loss in 1 month).  Ongoing  GOAL:   Patient will meet greater than or equal to 90% of their needs  Unmet  MONITOR:   PO intake, Supplement acceptance  ASSESSMENT:   66 year old male with PMH of HTN, HLD, GERD, who was admitted to hospital with chest pain, then had cardiac arrest. Transfered to inpatient Rehab unit on 8/29.  Patient familiar from acute hospital stay. He has been eating very poorly, but seems to have a better appetite since admission to Rehab unit and is eating well now, consuming 100% of meals.  Nutrition-Focused physical exam completed. Findings are no fat depletion, mild muscle depletion, and no edema.  Weight now 188 lbs, down from 208 lbs on 8/21. 10% weight loss within the past month is significant. Patient with moderate malnutrition related to acute illness. He agreed to receive Ensure supplements between meals to maximize protein and calorie intake. Labs and medications reviewed. Received consult for diet education. Patient is not appropriate for diet education at this time.  Diet Order:  Diet Heart Room service appropriate? Yes; Fluid consistency: Thin  Skin:  Reviewed, no issues  Last BM:  8/28  Height:   Ht Readings from Last 1 Encounters:  07/05/17 6\' 2"  (1.88 m)   Weight:   Wt Readings from Last 1 Encounters:  07/05/17 188 lb 4.4 oz (85.4 kg)    Ideal Body Weight:  86.4 kg  BMI:  Body mass index is 24.17 kg/m.  Estimated Nutritional Needs:   Kcal:  2200-2400  Protein:  100-120  gm  Fluid:  2.2-2.4 L  EDUCATION NEEDS:   Education needs no appropriate at this time  Molli Barrows, [place], [place], [place] Pager [phone] After Hours Pager [phone]

## 2017-07-06 NOTE — Progress Notes (Signed)
Occupational Therapy Session Note  Patient Details  Name: Jeffery Thomas MRN: [id number] Date of Birth: December 05, 1950  Today's Date: 07/06/2017 OT Individual Time: 1530-1600 OT Individual Time Calculation (min): 30 min    Short Term Goals: Week 1:  OT Short Term Goal 1 (Week 1): STGs=LTGs secondary to short estimated LOS  Skilled Therapeutic Interventions/Progress Updates:  Upon entering the room, pt seated in recliner chair awaiting therapist. Pt ambulating 10' into bathroom without use of AE and needing steady assistance for balance. Pt standing to void with close supervision for safety. Pt engaged in dynavision activity while standing with close supervision as pt reached across midline and in all planes without LOB. Pt able to hit 103 targets within 3 minutes with an average reaction time of 1.68 seconds. Pt navigating back to room with min verbal cues to locate. Pt supine in bed with bed alarm activated and call bell within reach upon exiting the room.   Therapy Documentation Precautions:  Precautions Precautions: Fall Precaution Comments: monitor HR General:   Vital Signs: Therapy Vitals Temp: 97.8 F (36.6 C) Temp Source: Oral Pulse Rate: 70 Resp: 20 BP: 129/67 Patient Position (if appropriate): Sitting Oxygen Therapy SpO2: 96 % O2 Device: Not Delivered Pain:   ADL:   Vision   Perception    Praxis   Exercises:   Other Treatments:    See Function Navigator for Current Functional Status.   Therapy/Group: Individual Therapy  Gypsy Decant 07/06/2017, 4:22 PM

## 2017-07-06 NOTE — Evaluation (Signed)
Speech Language Pathology Assessment and Plan  Patient Details  Name: Jeffery Thomas MRN: [id number] Date of Birth: 1951-07-19  SLP Diagnosis: Cognitive Impairments  Rehab Potential: Excellent ELOS: 5-7 days    Today's Date: 07/06/2017 SLP Individual Time: 1300-1355 SLP Individual Time Calculation (min): 55 min   Problem List:  Patient Active Problem List   Diagnosis Date Noted  . Anoxic-ischemic encephalopathy ([place]) 07/05/2017  . Cardiac device in situ   . Gastroesophageal reflux disease   . Acute systolic congestive heart failure ([place])   . Takatsuki syndrome   . Leukocytosis   . Prediabetes   . HCAP (healthcare-associated pneumonia)   . Agitation   . Acute encephalopathy   . Bradycardia   . Essential hypertension   . Abdominal distention   . Encounter for imaging study to confirm orogastric (OG) tube placement   . Acute respiratory failure with hypoxia ([place])   . Encounter for central line placement   . Ventricular fibrillation ([place]) 2017-07-18  . Sudden cardiac death ([place]) July 18, 2017  . ST elevation myocardial infarction (STEMI) [facility])    Past Medical History:  Past Medical History:  Diagnosis Date  . Hypertension    Past Surgical History:  Past Surgical History:  Procedure Laterality Date  . ICD IMPLANT N/A 07/04/2017   Procedure: ICD Implant;  Surgeon: Thompson Grayer, MD;  Location: [place] CV LAB;  Service: Cardiovascular;  Laterality: N/A;  . LEFT HEART CATH AND CORONARY ANGIOGRAPHY N/A 07-18-2017   Procedure: LEFT HEART CATH AND CORONARY ANGIOGRAPHY;  Surgeon: Lorretta Harp, MD;  Location: [place] CV LAB;  Service: Cardiovascular;  Laterality: N/A;    Assessment / Plan / Recommendation Clinical Impression Patient is a 66 y.o. male with history of HTN, GERD who was admitted on 07-18-17 with PEA cardiac arrest due to V fib/V tach and treated with CPR/ACLS protocol X 20 minutes with ROSC. History taken from chart review and family. EKG with inferior  STEMI and he was treated with Artic Sun protocol. Cardiac cath negative for CAD with LVEF <20% felt to be due to [facility] CM. CT head reviewed, negative for acute changes and CT cervical spine with multilevel degenerative changes. EEG with diffuse cerebral dysfunction with additional occasional focal slowing over right temporal region due to anoxic, ischemic, toxic/metabolic or medication effect. Repeat echo 8/22 revealed normal LV with mild LV hypertrophy and EF 35-40% with diffuse hypokinesis. HCAP due to Kleb Pneumoniae and Staph aureus treated with IV antibiotics and transitioned to augmentin. Patient self extubated on 8/24 and has had issues with agitation due to delirium. EP consulted for input and ICD placed on 8/28. Follow up CXR without PTX . Therapy evaluations revealed cognitive deficits as well as deficits in mobility therefore CIR recommended for follow up therapy and patient admitted 07/05/17.  Patient was administered the [facility] Cognitive Assessment (MoCA, version 7.1) and scored 14/30 points with a score of 26 or above considered normal. Patient demonstrated deficits in executive functioning, recall, attention, problem solving, reasoning, emergent awareness and safety awareness with impulsivity which impacts his ability to complete functional and familiar tasks safely. Patient would benefit from skilled SLP intervention to maximize his cognitive function and overall functional independence prior to discharge.    Skilled Therapeutic Interventions          Administered a cognitive-linguistic evaluation. Please see above for details. Educated patient on current cognitive impairments and goals of skilled SLP intervention. Patient verbalized understanding and agreement.    SLP Assessment  Patient will  need skilled [place] Pathology Services during CIR admission    Recommendations  Oral Care Recommendations: Oral care BID Recommendations for Other Services: Neuropsych  consult Patient destination: Home Follow up Recommendations: 24 hour supervision/assistance;Outpatient SLP Equipment Recommended: None recommended by SLP    SLP Frequency 3 to 5 out of 7 days   SLP Duration  SLP Intensity  SLP Treatment/Interventions 5-7 days  Minumum of 1-2 x/day, 30 to 90 minutes  Cognitive remediation/compensation;Cueing hierarchy;Functional tasks;Patient/family education;Therapeutic Activities;Environmental controls;Internal/external aids    Pain Pain Assessment Pain Assessment: No/denies pain  Prior Functioning Type of Home: House  Lives With: Spouse Available Help at Discharge: Family;Available PRN/intermittently Vocation: Retired  Function:  Cognition Comprehension Comprehension assist level: Understands basic 90% of the time/cues < 10% of the time  Expression   Expression assist level: Expresses complex 90% of the time/cues < 10% of the time  Social Interaction Social Interaction assist level: Interacts appropriately with others with medication or extra time (anti-anxiety, antidepressant).  Problem Solving Problem solving assist level: Solves basic 50 - 74% of the time/requires cueing 25 - 49% of the time  Memory Memory assist level: Recognizes or recalls 25 - 49% of the time/requires cueing 50 - 75% of the time   Short Term Goals: Week 1: SLP Short Term Goal 1 (Week 1): STGs=LTGs  Refer to Care Plan for Long Term Goals  Recommendations for other services: Neuropsych  Discharge Criteria: Patient will be discharged from SLP if patient refuses treatment 3 consecutive times without medical reason, if treatment goals not met, if there is a change in medical status, if patient makes no progress towards goals or if patient is discharged from hospital.  The above assessment, treatment plan, treatment alternatives and goals were discussed and mutually agreed upon: by patient  Bawi Lakins 07/06/2017, 3:13 PM

## 2017-07-06 NOTE — Plan of Care (Signed)
Problem: RH Ambulation Goal: LTG Patient will ambulate in controlled environment (PT) LTG: Patient will ambulate in a controlled environment, # of feet with assistance (PT). 150 ft  Goal: LTG Patient will ambulate in home environment (PT) LTG: Patient will ambulate in home environment, # of feet with assistance (PT). 50 ft  Goal: LTG Patient will ambulate in community environment (PT) LTG: Patient will ambulate in community environment, # of feet with assistance (PT). 150 ft   Problem: RH Stairs Goal: LTG Patient will ambulate up and down stairs w/assist (PT) LTG: Patient will ambulate up and down # of stairs with assistance (PT) 4 steps without rails

## 2017-07-06 NOTE — Progress Notes (Signed)
[facility] PHYSICAL MEDICINE & REHABILITATION     PROGRESS NOTE    Subjective/Complaints: Had a reasonable night. Slept well. No pain. Ready for therapies today  ROS: Limited due to cognitive/behavioral    Objective: Vital Signs: Blood pressure (!) 129/92, pulse 73, temperature 98.4 F (36.9 C), temperature source Oral, resp. rate 18, height 6\' 2"  (1.88 m), weight 85.4 kg (188 lb 4.4 oz), SpO2 97 %. Dg Chest 2 View  Result Date: 07/05/2017 CLINICAL DATA:  Status post defibrillator placement EXAM: CHEST  2 VIEW COMPARISON:  06/30/2017 FINDINGS: Cardiac shadow is mildly enlarged. Defibrillator is now seen. No pneumothorax is noted. Patchy changes are seen in the left lung base relatively stable from the prior exam. Left jugular central line has been removed in the interval. IMPRESSION: No pneumothorax following defibrillator placement Electronically Signed   By: Inez Catalina M.D.   On: 07/05/2017 07:45    Recent Labs  07/04/17 0223 07/06/17 0557  WBC 11.2* 10.0  HGB 14.8 14.6  HCT 44.3 46.3  PLT 244 291    Recent Labs  07/05/17 0228 07/06/17 0557  NA 138 139  K 4.1 4.4  CL 105 103  GLUCOSE 120* 153*  BUN 18 18  CREATININE 0.84 1.02  CALCIUM 8.6* 8.7*   CBG (last 3)   Recent Labs  07/05/17 1646 07/05/17 2101 07/06/17 0637  GLUCAP 119* 164* 145*    Wt Readings from Last 3 Encounters:  07/05/17 85.4 kg (188 lb 4.4 oz)  07/03/17 85 kg (187 lb 4.8 oz)    Physical Exam:  Constitutional: He is oriented to person, place, and time. He appears well-developed and well-nourished. He is uncooperative.  HENT:  Head: Normocephalic and atraumatic.  Mouth/Throat: mucosa pink/moist.  Eyes: PERRL  Neck: Normal range of motion. Neck supple.  Cardiovascular: RRR without murmur. No JVD   Respiratory: CTA Bilaterally without wheezes or rales. Normal effort  GI: Soft. Bowel sounds are normal. He exhibits no distension. There is no tenderness.  Musculoskeletal: He exhibits  edema (Scrotal still persistent). He exhibits no tenderness.  Neurological: He is alert and oriented to person, place, and time.  Delayed processing, oriented to name not date/place. Attention deficits  Motor: 4+ to 5/5 proximal to distal, sensation normal. A&Ox2  Skin: Skin is warm and dry.  Left chest with dry dressing over ICD placement. Dressing sternum Psychiatric: flat, cooperative.   Assessment/Plan: 1. Functional and cognitive deficits secondary to debility/encephalopathy which require 3+ hours per day of interdisciplinary therapy in a comprehensive inpatient rehab setting. Physiatrist is providing close team supervision and 24 hour management of active medical problems listed below. Physiatrist and rehab team continue to assess barriers to discharge/monitor patient progress toward functional and medical goals.  Function:  Bathing Bathing position      Bathing parts      Bathing assist        Upper Body Dressing/Undressing Upper body dressing                    Upper body assist        Lower Body Dressing/Undressing Lower body dressing                                  Lower body assist        Toileting Toileting          Toileting assist     Transfers Chair/bed transfer  Psychologist, sport and exercise Comprehension    Expression    Social Interaction    Problem Solving    Memory     Medical Problem List and Plan: 1.  Cognitive deficits as well as deficits in mobility secondary to encephalopathy and debility.  -begin therapies today  2.  DVT Prophylaxis/Anticoagulation: Pharmaceutical: Lovenox 3. Pain Management: ultram prn for pain 4. Mood: LCSW to follow for evaluation and support.  5. Neuropsych: This patient is not capable of making decisions on his own behalf. 6. Skin/Wound Care: Monitor wound for healing.  7. Fluids/Electrolytes/Nutrition: Monitor I/O.   Spironolactone  added 8/28  -I personally reviewed the patient's labs today.   8. Kleb/Staph PNA: On agumentin--antibiotic Day # 7/7 9. Witnessed cardiac arrest: NO driving for 6 months per [place] law--family aware.  10. HTN: Monitor BP bid--continue Cozaar and Spironolactone.  11. Takatsubo CM s/p ICD placement: Pacer precautions. On Cozaar and spironolactone. 12. Leucocytosis: Monitor for signs of infection.  13. Prediabetes: Hgb A1c- 6.3--new diagnosis. RD to educate patient and family on appropriate diet.  14. Encephalopathy: decreased Klonopin to 0.25 mg bid and wean as able.   -no obvious anxiety on exam today 15. GERD: On Protonix.    LOS (Days) 1 A Moody T, MD 07/06/2017 9:23 AM

## 2017-07-06 NOTE — Progress Notes (Signed)
Jamse Arn, MD Physician Signed Physical Medicine and Rehabilitation  Consult Note Date of Service: 07/04/2017 4:34 PM  Related encounter: ED to Hosp-Admission (Discharged) from 06/26/2017 in [facility]       [] Hide copied text Consult from 07/03/17 filed incorrectly:       Physical Medicine and Rehabilitation Consult  Reason for Consult: Debility due to cardiac arrest with anoxic encephalopathy Referring Physician: Dr. Claiborne Billings   HPI: Jeffery Thomas is a 66 y.o. male with history of HTN, GERD who was admitted on 06/26/17 with PEA cardiac arrest due to V fib/V tach and treated with CPR/ACLS protocol  X 20 minutes with ROSC.  History taken from chart review and family. EKG with inferior STEMI and he was treated with [place] protocol.  Cardiac cath negative for CAD with LVEF < 20% felt to be due to [facility] CM.  CT head reviewed, negative for acute changes and CT cervical spine with multilevel degenerative changes. EEG with diffuse cerebral dysfunction with additional occasional  focal slowing over right temporal region due to anoxic, ischemic, toxic/metabolic or medication effect. Repeat echo 8/22 revealed normal LV with mild LV hypertrophy and EF 35-40% with diffuse hypokinesis.  HCAP due to Kleb Pneumoniae and Staph aureus treated with zosyn--->augmentin.  Patient self extubated on 8/24 and has had issues with agitation due to delirium. EP consulted for input on ICD placement. Therapy evaluations revealed cognitive deficits as well as deficits in mobility therefore CIR recommended for follow up therapy.    Review of Systems  HENT: Positive for sore throat. Negative for hearing loss and tinnitus.   Eyes: Negative for blurred vision and double vision.  Respiratory: Negative for cough, shortness of breath and stridor.   Cardiovascular: Negative for chest pain and palpitations.  Gastrointestinal: Negative for constipation, heartburn and nausea.    Genitourinary: Negative for urgency.  Musculoskeletal: Negative for back pain and myalgias.  Skin: Negative for rash.  Neurological: Positive for speech change. Negative for dizziness and headaches.  Psychiatric/Behavioral: Positive for memory loss.  All other systems reviewed and are negative.         Past Medical History:  Diagnosis Date  . Hypertension     Past Surgical History:  Procedure Laterality Date  . LEFT HEART CATH AND CORONARY ANGIOGRAPHY N/A 06/26/2017   Procedure: LEFT HEART CATH AND CORONARY ANGIOGRAPHY;  Surgeon: Lorretta Harp, MD;  Location: [facility];  Service: Cardiovascular;  Laterality: N/A;         Family History  Problem Relation Age of Onset  . Hypertension Mother   . Diabetes Father   . Cancer Brother        pancreatic    Social History:  Married. Retired at age 74. He smokes 2 cigars about 6 days a week while golfing. He has never used smokeless tobacco. He drinks a mixed drink and glass of alcohol at nights and beer occasionally. His drug history is not on file.    Allergies: No Known Allergies          Medications Prior to Admission  Medication Sig Dispense Refill  . amLODipine (NORVASC) 10 MG tablet Take 10 mg by mouth daily.    Marland Kitchen atorvastatin (LIPITOR) 40 MG tablet Take 40 mg by mouth daily.    . hydrochlorothiazide (HYDRODIURIL) 25 MG tablet Take 25 mg by mouth daily.    . hydrOXYzine (ATARAX/VISTARIL) 25 MG tablet Take 25 mg by mouth every 6 (six) hours  as needed for itching.    . levocetirizine (XYZAL) 5 MG tablet Take 5 mg by mouth every morning.     . loperamide (IMODIUM) 2 MG capsule Take 2-4 mg by mouth daily as needed for diarrhea or loose stools.    . metoprolol succinate (TOPROL-XL) 25 MG 24 hr tablet Take 50 mg by mouth daily.    . montelukast (SINGULAIR) 10 MG tablet Take 10 mg by mouth every morning.     Marland Kitchen omeprazole (PRILOSEC) 20 MG capsule Take 40 mg by mouth daily.    Marland Kitchen  PRESCRIPTION MEDICATION Steroid dose pack.    . ranitidine (ZANTAC) 150 MG tablet Take 150 mg by mouth 2 (two) times daily.      Home: Home Living Family/patient expects to be discharged to:: Private residence Living Arrangements: Spouse/significant other Available Help at Discharge: Family, Other (Comment) (can arrange near 24 hour assist) Type of Home: House Home Access: Stairs to enter [employer] of Steps: 2 (2 garage door; 1 steep step front door) Entrance Stairs-Rails: None Home Layout: Two level, Able to live on main level with bedroom/bathroom ("man cave" downstairs) Alternate Level Stairs-Number of Steps: flight Alternate Level Stairs-Rails: Right (descending) Bathroom Shower/Tub: Walk-in shower  Lives With: Spouse  Functional History: Prior Function Level of Independence: Independent Comments: Futures trader; retired from [employer]:  Mobility:   Transfers Overall transfer level: Needs assistance Equipment used: None Transfers: Sit to/from [place] to Stand: Mod assist, Max assist General transfer comment: Noting good strength to power up; once standing, however, very slow balance reactions (without hip or ankle strategies employed), and requiring at least Mod/Max assist to prevent fall; better with use of UE support on RW; required step-by-step cues to sit Ambulation/Gait Ambulation/Gait assistance: +2 safety/equipment, Mod assist, Max assist Ambulation Distance (Feet):  (Hallway ambulation) Assistive device: Rolling walker (2 wheeled) Gait Pattern/deviations: Step-through pattern, Narrow base of support, Trunk flexed General Gait Details: Used bilateral UE support on RW (despite Mr. Debarr' displeasure with the need for a RW); Tending to keep both hips slightly flexed throughout gait cycle; very narrow step width (like walking a line); noting at least 5 losses of balance requiring mon to mod assist to regain balance; at one point with loss of  balance to the L including RW tipping as well -- max assist to prevent fall Gait velocity: varried    ADL:    Cognition: Cognition Overall Cognitive Status: Impaired/Different from baseline Arousal/Alertness: Awake/alert Orientation Level: Oriented to person, Oriented to place, Oriented to time, Disoriented to situation Attention: Focused Focused Attention: Impaired Focused Attention Impairment: Verbal basic, Functional basic Memory: Impaired Memory Impairment: Storage deficit Awareness: Impaired Awareness Impairment: Intellectual impairment Problem Solving: Impaired Problem Solving Impairment: Verbal basic Behaviors: Restless, Impulsive, Perseveration, Poor frustration tolerance Safety/Judgment: Impaired Cognition Arousal/Alertness: Awake/alert Behavior During Therapy: Impulsive Overall Cognitive Status: Impaired/Different from baseline Area of Impairment: Attention, Memory, Safety/judgement, Problem solving Current Attention Level: Sustained Memory: Decreased short-term memory Safety/Judgement: Decreased awareness of safety, Decreased awareness of deficits Problem Solving: Difficulty sequencing General Comments: Did not initially remeber [facility] trip he tood earlier this summer; After walk ( in which he hadgreater than 5 losses of balance, some requiring mod/max assist to prevent fall, when asked if walking was easy or hard, he stated easy, and that he had no difficulty  Blood pressure (!) 152/93, pulse 60, temperature 98.1 F (36.7 C), temperature source Oral, resp. rate 20, height 6\' 2"  (1.88 m), weight 85 kg (187 lb 4.8 oz),  SpO2 98 %. Physical Exam  Nursing note and vitals reviewed. Constitutional: He appears well-developed and well-nourished. No distress.  HENT:  Head: Normocephalic and atraumatic.  Mouth/Throat: Oropharynx is clear and moist.  Eyes: Pupils are equal, round, and reactive to light. Conjunctivae and EOM are normal.  Neck: Normal range of  motion. Neck supple.  Cardiovascular: Normal rate and regular rhythm.   Respiratory: Effort normal and breath sounds normal. No stridor. No respiratory distress. He has no wheezes.  GI: Soft. Bowel sounds are normal. He exhibits no distension. There is no tenderness.  Musculoskeletal: He exhibits no edema or tenderness.  Neurological: He is alert.  Slow to process needing redirection.  Oriented to self. Needed cues to recall age and DOB.  He is able to follow simple one and two step motor commands.  Motor: 4+/5 throughout Sensation intact to light touch  Skin: Skin is warm and dry. He is not diaphoretic. No erythema.  Psychiatric: His affect is blunt. His speech is delayed. He is slowed. Cognition and memory are impaired. He expresses inappropriate judgment.         Results for orders placed or performed during the hospital encounter of 06/26/17 (from the past 24 hour(s))  Glucose, capillary     Status: Abnormal   Collection Time: 07/02/17 12:15 PM  Result Value Ref Range   Glucose-Capillary 121 (H) 65 - 99 mg/dL  Glucose, capillary     Status: Abnormal   Collection Time: 07/02/17  5:39 PM  Result Value Ref Range   Glucose-Capillary 109 (H) 65 - 99 mg/dL  Hemoglobin A1c     Status: Abnormal   Collection Time: 07/02/17  6:14 PM  Result Value Ref Range   Hgb A1c MFr Bld 6.3 (H) 4.8 - 5.6 %   Mean Plasma Glucose 134.11 mg/dL  Glucose, capillary     Status: Abnormal   Collection Time: 07/02/17  9:40 PM  Result Value Ref Range   Glucose-Capillary 103 (H) 65 - 99 mg/dL  Magnesium     Status: None   Collection Time: 07/03/17  7:05 AM  Result Value Ref Range   Magnesium 2.2 1.7 - 2.4 mg/dL  Phosphorus     Status: None   Collection Time: 07/03/17  7:05 AM  Result Value Ref Range   Phosphorus 3.9 2.5 - 4.6 mg/dL  Basic metabolic panel     Status: None   Collection Time: 07/03/17  7:05 AM  Result Value Ref Range   Sodium 140 135 - 145 mmol/L   Potassium 3.9 3.5 -  5.1 mmol/L   Chloride 102 101 - 111 mmol/L   CO2 26 22 - 32 mmol/L   Glucose, Bld 93 65 - 99 mg/dL   BUN 18 6 - 20 mg/dL   Creatinine, Ser 0.89 0.61 - 1.24 mg/dL   Calcium 8.9 8.9 - 10.3 mg/dL   GFR calc non Af Amer >60 >60 mL/min   GFR calc Af Amer >60 >60 mL/min   Anion gap 12 5 - 15  CBC     Status: Abnormal   Collection Time: 07/03/17  7:05 AM  Result Value Ref Range   WBC 10.7 (H) 4.0 - 10.5 K/uL   RBC 5.38 4.22 - 5.81 MIL/uL   Hemoglobin 15.4 13.0 - 17.0 g/dL   HCT 47.5 39.0 - 52.0 %   MCV 88.3 78.0 - 100.0 fL   MCH 28.6 26.0 - 34.0 pg   MCHC 32.4 30.0 - 36.0 g/dL   RDW 13.7 11.5 -  15.5 %   Platelets 225 150 - 400 K/uL  Glucose, capillary     Status: None   Collection Time: 07/03/17  7:16 AM  Result Value Ref Range   Glucose-Capillary 97 65 - 99 mg/dL   No results found.  Assessment/Plan: Diagnosis: Debility  Labs and images independently reviewed.  Records reviewed and summated above.  1. Does the need for close, 24 hr/day medical supervision in concert with the patient's rehab needs make it unreasonable for this patient to be served in a less intensive setting? Yes 2. Co-Morbidities requiring supervision/potential complications: HTN (monitor and provide prns in accordance with increased physical exertion and pain), GERD (cont meds), STEMI (meds), HCAP (abx), systolic CHF (Monitor in accordance with increased physical activity and avoid UE resistance excercises), Takatsubo CM (recs per Cards), leukocytosis (cont to monitor for signs and symptoms of infection, further workup if indicated), prediabetes (Monitor in accordance with exercise and adjust meds as necessary) 3. Due to safety, disease management, medication administration and patient education, does the patient require 24 hr/day rehab nursing? Yes 4. Does the patient require coordinated care of a physician, rehab nurse, PT (1-2 hrs/day, 5 days/week), OT (1-2 hrs/day, 5 days/week) and SLP (1-2  hrs/day, 5 days/week) to address physical and functional deficits in the context of the above medical diagnosis(es)? Yes Addressing deficits in the following areas: balance, endurance, locomotion, transferring, bathing, dressing, toileting, cognition and psychosocial support 5. Can the patient actively participate in an intensive therapy program of at least 3 hrs of therapy per day at least 5 days per week? Potentially 6. The potential for patient to make measurable gains while on inpatient rehab is excellent 7. Anticipated functional outcomes upon discharge from inpatient rehab are supervision and min assist  with PT, supervision and min assist with OT, supervision and min assist with SLP. 8. Estimated rehab length of stay to reach the above functional goals is: 14-18 days. 9. Anticipated D/C setting: Home 10. Anticipated post D/C treatments: HH therapy and Home excercise program 11. Overall Rehab/Functional Prognosis: good  RECOMMENDATIONS: This patient's condition is appropriate for continued rehabilitative care in the following setting: CIR after completion of medical workup. Patient has agreed to participate in recommended program. Potentially Note that insurance prior authorization may be required for reimbursement for recommended care.  Comment: Rehab Admissions Coordinator to follow up.  Delice Lesch, MD, [address] [place] 07/03/2017

## 2017-07-06 NOTE — Evaluation (Signed)
Physical Therapy Assessment and Plan  Patient Details  Name: Jeffery Thomas MRN: [id number] Date of Birth: [birth year]  PT Diagnosis: Abnormality of gait, Coordination disorder, Difficulty walking, Impaired cognition and Muscle weakness Rehab Potential: Excellent ELOS: 1 week   Today's Date: 07/06/2017 PT Individual Time: 1105-1202 PT Individual Time Calculation (min): 57 min    Problem List:  Patient Active Problem List   Diagnosis Date Noted  . Anoxic-ischemic encephalopathy ([place]) 07/05/2017  . Cardiac device in situ   . Gastroesophageal reflux disease   . Acute systolic congestive heart failure ([place])   . Takatsuki syndrome   . Leukocytosis   . Prediabetes   . HCAP (healthcare-associated pneumonia)   . Agitation   . Acute encephalopathy   . Bradycardia   . Essential hypertension   . Abdominal distention   . Encounter for imaging study to confirm orogastric (OG) tube placement   . Acute respiratory failure with hypoxia ([place])   . Encounter for central line placement   . Ventricular fibrillation ([place]) 07/19/17  . Sudden cardiac death ([place]) 19-Jul-2017  . ST elevation myocardial infarction (STEMI) [facility])     Past Medical History:  Past Medical History:  Diagnosis Date  . Hypertension    Past Surgical History:  Past Surgical History:  Procedure Laterality Date  . ICD IMPLANT N/A 07/04/2017   Procedure: ICD Implant;  Surgeon: Thompson Grayer, MD;  Location: [place] [facility];  Service: Cardiovascular;  Laterality: N/A;  . LEFT HEART CATH AND CORONARY ANGIOGRAPHY N/A 07-19-2017   Procedure: LEFT HEART CATH AND CORONARY ANGIOGRAPHY;  Surgeon: Lorretta Harp, MD;  Location: [place] [facility];  Service: Cardiovascular;  Laterality: N/A;    Assessment & Plan Clinical Impression: Patient is a 66 y.o. year old male with history of HTN, GERD who was admitted on 2017-07-19 with PEA cardiac arrest due to V fib/V tach and treated with CPR/ACLS protocol X 20 minutes with ROSC.  History taken from chart review and family. EKG with inferior STEMI and he was treated with [place] protocol. Cardiac cath negative for CAD with LVEF <20% felt to be due to [facility] CM. CT head reviewed, negative for acute changes and CT cervical spine with multilevel degenerative changes. EEG with diffuse cerebral dysfunction with additional occasional focal slowing over right temporal region due to anoxic, ischemic, toxic/metabolic or medication effect. Repeat echo 8/22 revealed normal LV with mild LV hypertrophy and EF 35-40% with diffuse hypokinesis. HCAP due to Kleb Pneumoniae and Staph aureus treated with IV antibiotics and transitioned to augmentin. Patient self extubated on 8/24 and has had issues with agitation due to delirium. EP consulted for input and ICD placed on 8/28. Follow up CXR without PTX . Therapy evaluations revealed cognitive deficits as well as deficits in mobility therefore CIR recommended for follow up therapy..  Patient transferred to CIR on 07/05/2017 .   Patient currently requires min with mobility secondary to muscle weakness, decreased cardiorespiratoy endurance, decreased coordination, decreased attention, decreased awareness, decreased problem solving, decreased safety awareness, decreased memory and delayed processing, and decreased standing balance, decreased postural control and decreased balance strategies.  Prior to hospitalization, patient was independent  with mobility and lived with Spouse in a House home.  Home access is 2Stairs to enter.  Patient will benefit from skilled PT intervention to maximize safe functional mobility, minimize fall risk and decrease caregiver burden for planned discharge home with 24 hour supervision.  Anticipate patient will benefit from follow up OP at discharge.  PT - End of Session Activity Tolerance: Tolerates 30+ min activity with multiple rests Endurance Deficit: Yes Endurance Deficit Description: 2/2 decreased cardiovascular  endurance PT Assessment Rehab Potential (ACUTE/IP ONLY): Excellent PT Barriers to Discharge: Behavior PT Patient demonstrates impairments in the following area(s): Balance;Behavior;Endurance;Motor;Perception;Safety PT Transfers Functional Problem(s): Bed Mobility;Bed to Chair;Car;Furniture;Floor PT Locomotion Functional Problem(s): Ambulation;Stairs PT Plan PT Intensity: Minimum of 1-2 x/day ,45 to 90 minutes PT Frequency: 5 out of 7 days PT Duration Estimated Length of Stay: 1 week PT Treatment/Interventions: Ambulation/gait training;Community reintegration;DME/adaptive equipment instruction;Neuromuscular re-education;Psychosocial support;Stair training;UE/LE Strength taining/ROM;UE/LE Coordination activities;Therapeutic Activities;Pain management;Discharge planning;Balance/vestibular training;Cognitive remediation/compensation;Disease management/prevention;Functional mobility training;Patient/family education;Therapeutic Exercise;Visual/perceptual remediation/compensation PT Transfers Anticipated Outcome(s): supervision with LRAD PT Locomotion Anticipated Outcome(s): supervision with LRAD PT Recommendation Recommendations for Other Services: Speech consult;Neuropsych consult;Therapeutic Recreation consult Therapeutic Recreation Interventions: Pet therapy;Kitchen group;Outing/community reintergration Follow Up Recommendations: 24 hour supervision/assistance;Outpatient PT Patient destination: Home Equipment Recommended: To be determined  Skilled Therapeutic Intervention Patient received in room & agreeable to tx, denying c/o pain. No family present for session. PT evaluation initiated with therapist educating pt on ELOS, daily therapy schedule, and other various CIR information. Pt completed functional mobility tasks with min assist overall; please see above & below for details. Pt completed Berg Balance Test & scored 32/56; educated pt on interpretation of score & current fall risk. Patient  demonstrates increased fall risk as noted by score of 32/56 on Berg Balance Scale.  (<36= high risk for falls, close to 100%; 37-45 significant >80%; 46-51 moderate >50%; 52-55 lower >25%). Pt negotiated obstacle course (weaving between cones) with min assist for balance. Pt engaged in peg board task requiring min cuing for problem solving and error correction for 2 moderately complex designs from pre-selected pieces. At end of session pt left sitting in recliner in room with QRB & chair alarm donned, all needs within reach.    PT Evaluation Precautions/Restrictions Precautions Precautions: Fall Precaution Comments: monitor HR   General Chart Reviewed: Yes Additional Pertinent History: HTN, GERD, STEMI Response to Previous Treatment: Patient with no complaints from previous session. Family/Caregiver Present: No   Vital Signs  At rest at beginning of session: SpO2 = 94%, HR = 69 bpm After negotiating stairs: SpO2 = 90%, HR = 71 bpm At end of session: SpO2 = 93%, HR = 71 bpm  Pain Denied c/o pain.  Home Living/Prior Functioning Home Living Available Help at Discharge: Family;Available PRN/intermittently Type of Home: House Home Access: Stairs to enter [employer] of Steps: 2 Entrance Stairs-Rails: None Home Layout: One level;Laundry or work area in basement   Lives With: Spouse (wife, [name]) Prior Function Level of Independence: Independent with basic ADLs;Independent with transfers;Independent with gait;Independent with homemaking with ambulation  Able to Take Stairs?: Yes Driving: Yes Vocation: Retired Biomedical scientist: retired from [employer]: Hobbies-yes (Comment) Comments: golf   Vision/Perception  Pt reports wearing glasses for reading only at baseline.  Pt denies changes in baseline vision.   Cognition Overall Cognitive Status: Impaired/Different from baseline Arousal/Alertness: Awake/alert Orientation Level: Oriented to person;Oriented to  place;Oriented to time (unable to recall current age, today's date, or reason for hospitalization) Memory: Impaired Memory Impairment: Decreased long term memory;Decreased short term memory;Decreased recall of new information Awareness: Impaired Awareness Impairment: Intellectual impairment Problem Solving: Impaired Behaviors: Impulsive Safety/Judgment: Impaired   Glass blower/designer - Skilled Clinical Observations: general weakness   Mobility Bed Mobility Bed Mobility: Rolling Right;Rolling Left;Supine to Sit;Sit to Supine (mat table) Rolling Right: 6: Modified independent (Device/Increase time) Rolling  Left: 6: Modified independent (Device/Increase time) Supine to Sit: 6: Modified independent (Device/Increase time) Sit to Supine: 6: Modified independent (Device/Increase time) Transfers Transfers: Yes Sit to Stand: 4: Min guard   Locomotion  Ambulation Ambulation: Yes Ambulation/Gait Assistance: 4: Min assist Ambulation Distance (Feet): 150 Feet Assistive device: None Gait Gait: Yes Gait Pattern:  (impaired step length & width BLE) Stairs / Additional Locomotion Stairs: Yes Stairs Assistance: 4: Min assist Stairs Assistance Details: Verbal cues for precautions/safety;Verbal cues for technique Stair Management Technique: No rails Number of Stairs: 24 Height of Stairs:  (6" + 3") Ramp: 4: Min assist (ambulatory without AD) Curb: 4: Min assist (without AD) Wheelchair Mobility Wheelchair Mobility: No   Trunk/Postural Assessment  Cervical Assessment Cervical Assessment: Within Functional Limits Thoracic Assessment Thoracic Assessment: Within Functional Limits Lumbar Assessment Lumbar Assessment: Within Functional Limits Postural Control Postural Control: Deficits on evaluation   Balance Balance Balance Assessed: Yes Standardized Balance Assessment Standardized Balance Assessment: Berg Balance Test Berg Balance Test Sit to Stand: Able to stand without using  hands and stabilize independently Standing Unsupported: Able to stand 30 seconds unsupported (able to maintain balance for 90 seconds) Sitting with Back Unsupported but Feet Supported on Floor or Stool: Able to sit 2 minutes under supervision Stand to Sit: Sits safely with minimal use of hands Transfers: Able to transfer with verbal cueing and /or supervision Standing Unsupported with Eyes Closed: Able to stand 10 seconds with supervision Standing Ubsupported with Feet Together: Able to place feet together independently but unable to hold for 30 seconds (able to place feet together but only able to hold for 35 seconds) From Standing, Reach Forward with Outstretched Arm: Reaches forward but needs supervision From Standing Position, Pick up Object from Floor: Able to pick up shoe, needs supervision From Standing Position, Turn to Look Behind Over each Shoulder: Looks behind from both sides and weight shifts well Turn 360 Degrees: Needs close supervision or verbal cueing (completes full turns to both directions) Standing Unsupported, Alternately Place Feet on Step/Stool: Able to complete >2 steps/needs minimal assist Standing Unsupported, One Foot in Front: Able to take small step independently and hold 30 seconds Standing on One Leg: Unable to try or needs assist to prevent fall (holds 3 seconds but then requires assistance to prevent LOB) Total Score: 32   Extremity Assessment  RLE Assessment RLE Assessment: Within Functional Limits LLE Assessment LLE Assessment: Within Functional Limits   See Function Navigator for Current Functional Status.   Refer to Care Plan for Long Term Goals  Recommendations for other services: Neuropsych and Therapeutic Recreation  Pet therapy, Kitchen group and Outing/community reintegration  Discharge Criteria: Patient will be discharged from PT if patient refuses treatment 3 consecutive times without medical reason, if treatment goals not met, if there is a  change in medical status, if patient makes no progress towards goals or if patient is discharged from hospital.  The above assessment, treatment plan, treatment alternatives and goals were discussed and mutually agreed upon: by patient  [name] 07/06/2017, 12:30 PM

## 2017-07-06 NOTE — Progress Notes (Signed)
Patient information reviewed and entered into e[facility]hab system by Djuna Frechette, RN, CRRN, PPS Coordinator.  Information including medical coding and functional independence measure will be reviewed and updated through discharge.    

## 2017-07-06 NOTE — Progress Notes (Signed)
Jamse Arn, MD Physician Signed Physical Medicine and Rehabilitation  PMR Pre-admission Date of Service: 07/04/2017 3:26 PM  Related encounter: ED to Hosp-Admission (Discharged) from 06/26/2017 in [place]       '[]' Hide copied text PMR Admission Coordinator Pre-Admission Assessment  Patient: Jeffery Thomas is an 66 y.o., male MRN: [id number] DOB: May 30, 1951 Height: '6\' 2"'  (188 cm) Weight: 85 kg (187 lb 4.8 oz)                                                                                                                                                  Insurance Information HMO:    PPO:      PCP:      IPA:      80/20:      OTHER: Dierdre Highman retired PRIMARY: Veterinary surgeon      Policy#: [id number]      Subscriber:  Domingo Cocking [place] Name: Burman Nieves      Phone#:  [phone]     Fax#: [phone] Pre-Cert#: [id number]  For 24 days with update due 07/29/17      Employer: Retired Therapist, art Benefits:  Phone #: [phone]     Name:  Fax and [place]. Date: 11/07/16     Deduct: $150 (met $150)      Catastrophic Cap: $3000 (met $815.83)      Life Max:  N/A CIR: $250/day or 75% and then 80%      SNF: $250/day up to 75% and then 80% Outpatient:      Co-Pay: $41/visit Home Health: 100%      Co-Pay: none DME: 80%     Co-Pay: 20% Providers: in network  Medicaid Application Date:        Case Manager:   Disability Application Date:        Case Worker:    Emergency Tax adviser Information    Name Relation Home Work Mobile   [place]    [place]    [phone]     Current Medical History  Patient Admitting Diagnosis: Debility   History of Present Illness: A 66 y.o.malewith history of HTN, GERD who was admitted on 06/26/17 with PEA cardiac arrest due to V fib/V tach and treated with CPR/ACLS protocol X 20 minutes with ROSC. History taken from chart review and family. EKG with  inferior STEMI and he was treated with [place] protocol. Cardiac cath negative for CAD with LVEF <20% felt to be due to [facility] CM. CT head reviewed, negative for acute changes and CT cervical spine with multilevel degenerative changes. EEG with diffuse cerebral dysfunction with additional occasional focal slowing over right temporal region due to anoxic, ischemic, toxic/metabolic or medication effect. Repeat echo 8/22 revealed normal LV with mild LV hypertrophy and  EF 35-40% with diffuse hypokinesis. HCAP due to Kleb Pneumoniae and Staph aureus treated with zosyn--->augmentin. Patient self extubated on 8/24 and has had issues with agitation due to delirium. EP consulted for input on ICD placement. ICD placed left chest 07/04/17.  Therapy evaluations revealed cognitive deficits as well as deficits in mobility therefore CIR recommended for follow up therapy.    Past Medical History  Past Medical History:  Diagnosis Date  . Hypertension     Family History  family history includes Cancer in his brother; Diabetes in his father; Hypertension in his mother.  Prior Rehab/Hospitalizations: Had [facility] and outpatient therapy 8 yrs ago after TKR  Has the patient had major surgery during 100 days prior to admission? No  Current Medications   Current Facility-Administered Medications:  .  0.9 %  sodium chloride infusion, 250 mL, Intravenous, PRN, Allred, James, MD .  acetaminophen (TYLENOL) tablet 325-650 mg, 325-650 mg, Oral, Q4H PRN, Allred, James, MD .  amoxicillin-clavulanate (AUGMENTIN) 875-125 MG per tablet 1 tablet, 1 tablet, Oral, Q12H, Omar Person, NP, 1 tablet at 07/05/17 0908 .  carvedilol (COREG) tablet 3.125 mg, 3.125 mg, Oral, BID WC, Minus Breeding, MD, 3.125 mg at 07/05/17 0906 .  ceFAZolin (ANCEF) IVPB 1 g/50 mL premix, 1 g, Intravenous, Q6H, Allred, James, MD, Last Rate: 100 mL/hr at 07/05/17 0616, 1 g at 07/05/17 0616 .  clonazePAM (KLONOPIN) tablet 0.5 mg, 0.5 mg,  Oral, BID, Omar Person, NP, 0.5 mg at 07/05/17 0907 .  feeding supplement (ENSURE ENLIVE) (ENSURE ENLIVE) liquid 237 mL, 237 mL, Oral, TID BM, Lorretta Harp, MD, 237 mL at 07/05/17 0907 .  HYDROcodone-acetaminophen (NORCO/VICODIN) 5-325 MG per tablet 1-2 tablet, 1-2 tablet, Oral, Q4H PRN, Allred, James, MD .  insulin aspart (novoLOG) injection 0-15 Units, 0-15 Units, Subcutaneous, TID WC, Barrett, Rhonda G, PA-C, 2 Units at 07/05/17 0907 .  insulin aspart (novoLOG) injection 0-5 Units, 0-5 Units, Subcutaneous, QHS, Barrett, Rhonda G, PA-C .  losartan (COZAAR) tablet 50 mg, 50 mg, Oral, Daily, Minus Breeding, MD, 50 mg at 07/05/17 0907 .  menthol-cetylpyridinium (CEPACOL) lozenge 3 mg, 1 lozenge, Oral, PRN, Reino Bellis B, NP, 3 mg at 07/04/17 1136 .  ondansetron (ZOFRAN) injection 4 mg, 4 mg, Intravenous, Q6H PRN, Allred, James, MD .  pantoprazole (PROTONIX) EC tablet 40 mg, 40 mg, Oral, QHS, Lorretta Harp, MD, 40 mg at 07/04/17 2249 .  sodium chloride flush (NS) 0.9 % injection 3 mL, 3 mL, Intravenous, Q12H, Allred, James, MD, 3 mL at 07/05/17 0908 .  sodium chloride flush (NS) 0.9 % injection 3 mL, 3 mL, Intravenous, PRN, Allred, James, MD .  spironolactone (ALDACTONE) tablet 12.5 mg, 12.5 mg, Oral, BID, Reino Bellis B, NP, 12.5 mg at 07/05/17 1275  Patients Current Diet: Diet Heart Room service appropriate? Yes; Fluid consistency: Thin  Precautions / Restrictions Precautions Precautions: Fall Restrictions Weight Bearing Restrictions: No   Has the patient had 2 or more falls or a fall with injury in the past year?No  Prior Activity Level Community (5-7x/wk): Went out daily, played golf, worked out, was driving.  Home Assistive Devices / Equipment Home Assistive Devices/Equipment: None  Prior Device Use: Indicate devices/aids used by the patient prior to current illness, exacerbation or injury? None  Prior Functional Level Prior Function Level of  Independence: Independent Comments: Loves golf; retired from [place]: Did the patient need help bathing, dressing, using the toilet or eating?  Independent  Indoor Mobility: Did the  patient need assistance with walking from room to room (with or without device)? Independent  Stairs: Did the patient need assistance with internal or external stairs (with or without device)? Independent  Functional Cognition: Did the patient need help planning regular tasks such as shopping or remembering to take medications? Independent  Current Functional Level Cognition  Arousal/Alertness: Awake/alert Overall Cognitive Status: Impaired/Different from baseline Current Attention Level: Sustained Orientation Level: Oriented X4, Other (comment) (Implsibe and intermittent confusion) Safety/Judgement: Decreased awareness of safety, Decreased awareness of deficits General Comments: Stated Obama is president, is in the hospital becasue he had a small stroke and feels his only problems are with balance; unable to tell me golf course where he lives and where he lived prior to moving to [place];  Attention: Focused Focused Attention: Impaired Focused Attention Impairment: Verbal basic, Functional basic Memory: Impaired Memory Impairment: Storage deficit Awareness: Impaired Awareness Impairment: Intellectual impairment Problem Solving: Impaired Problem Solving Impairment: Verbal basic Behaviors: Restless, Impulsive, Perseveration, Poor frustration tolerance Safety/Judgment: Impaired    Extremity Assessment (includes Sensation/Coordination)  Upper Extremity Assessment: Generalized weakness  Lower Extremity Assessment: Defer to PT evaluation RLE Deficits / Details: Strength WFL for simple mobility tasks; Noting significantly decr coordination, with decr balance reactions bilaterally LLE Deficits / Details: Strength WFL for simple mobility tasks; Noting significantly decr coordination, with  decr balance reactions bilaterally    ADLs  Overall ADL's : Needs assistance/impaired Grooming: Standing, Minimal assistance Grooming Details (indicate cue type and reason): a for balance Upper Body Bathing: Set up, Supervision/ safety, Sitting Lower Body Bathing: Minimal assistance, Sit to/from stand Upper Body Dressing : Set up, Supervision/safety, Sitting Lower Body Dressing: Minimal assistance, Sit to/from stand Toilet Transfer: Moderate assistance Toileting- Clothing Manipulation and Hygiene: Minimal assistance, Sit to/from stand Functional mobility during ADLs: Moderate assistance, Cueing for safety (without AD)    Mobility  General bed mobility comments: OOB in chair    Transfers  Overall transfer level: Needs assistance Equipment used: None Transfers: Sit to/from Stand Sit to Stand: Min assist General transfer comment:  LOB in standing. Poor awareness of balance issues    Ambulation / Gait / Stairs / Wheelchair Mobility  Ambulation/Gait Ambulation/Gait assistance: +2 safety/equipment, Mod assist, Max assist Ambulation Distance (Feet): 200 Feet Assistive device: Rolling walker (2 wheeled) Gait Pattern/deviations: Step-through pattern, Narrow base of support, Trunk flexed General Gait Details: Used bilateral UE support on RW (despite Mr. Fread' displeasure with the need for a RW); Tending to keep both hips slightly flexed throughout gait cycle; very narrow step width (like walking a line); noting at least 5 losses of balance requiring mon to mod assist to regain balance; at one point with loss of balance to the L including RW tipping as well -- max assist to prevent fall Gait velocity: varied Gait velocity interpretation: Below normal speed for age/gender    Posture / Balance Balance Overall balance assessment: Needs assistance Sitting-balance support: Feet supported Sitting balance-Leahy Scale: Good Standing balance-Leahy Scale: Poor Standing balance comment:  very slow balance reactions; reaching for support surfaces    Special needs/care consideration BiPAP/CPAP Yes, CPAP CPM No Continuous Drip IV 0.9% NS at 50 mL/hr Dialysis No      Life Vest No Oxygen No Special Bed No Trach Size No Wound Vac (area) No      Skin Has dry, red skin that peels and has to take steroids at times.  New dressing to left chest over ICD placed 07/04/17  Bowel mgmt: Last BM 07/04/17 Bladder mgmt: Using urinal WDL Diabetic mgmt No    Previous Home Environment Living Arrangements: Spouse/significant other  Lives With: Spouse Available Help at Discharge: Family, Other (Comment) (can arrange near 24 hour assist) Type of Home: House Home Layout: Two level, Able to live on main level with bedroom/bathroom ("man cave" downstairs) Alternate Level Stairs-Rails: Right (descending) Alternate Level Stairs-Number of Steps: flight Home Access: Stairs to enter Entrance Stairs-Rails: None Entrance Stairs-Number of Steps: 2 (2 garage door; 1 steep step front door) Bathroom Shower/Tub: Multimedia programmer: Standard Bathroom Accessibility: Yes How Accessible: Accessible via walker [place]: No  Discharge Living Setting Plans for Discharge Living Setting: Patient's home, House, Lives with (comment) (Lives with wife.) Type of Home at Discharge: House Discharge Home Layout: Two level, Laundry or work area in basement, Able to live on main level with bedroom/bathroom Alternate Level Stairs-Number of Steps: Flight (Basement with man cave, bedroom/bathroom.) Discharge Home Access: Stairs to enter Technical brewer of Steps: 2 steps at front and garage entry Does the patient have any problems obtaining your medications?: No  Social/Family/Support Systems Patient Roles: Spouse, Parent, Other (Comment) (Has a wife, dtr, son, brother and sister-in-law.) Contact Information: Oreste Majeed - wife - (c)  [phone] Anticipated Caregiver: Wife and brother Ability/Limitations of Caregiver: Wife works 8am to 5 pm.  Brother, Guadalupe, plans to stay during the day. Caregiver Availability: 24/7 Discharge Plan Discussed with Primary Caregiver: Yes Is Caregiver In Agreement with Plan?: Yes Does Caregiver/Family have Issues with Lodging/Transportation while Pt is in Rehab?: No  Goals/Additional Needs Patient/Family Goal for Rehab: PT/OT/SLP supervision to min assist goals Expected length of stay: 14-18 days Cultural Considerations: None Dietary Needs: Heart diet, thin liquids Equipment Needs: TBD Pt/Family Agrees to Admission and willing to participate: Yes Program Orientation Provided & Reviewed with Pt/Caregiver Including Roles  & Responsibilities: Yes  Decrease burden of Care through IP rehab admission: N/A  Possible need for SNF placement upon discharge: Not anticipated  Patient Condition: This patient's medical and functional status has changed since the consult dated: 07/03/17 in which the Rehabilitation Physician determined and documented that the patient's condition is appropriate for intensive rehabilitative care in an inpatient rehabilitation facility. See "History of Present Illness" (above) for medical update. Functional changes are: Currently requiring min assist for transfers and ambulated 200 feet mod assist +2 RW. Patient's medical and functional status update has been discussed with the Rehabilitation physician and patient remains appropriate for inpatient rehabilitation. Will admit to inpatient rehab today.  Preadmission Screen Completed By:  Retta Diones, 07/05/2017 11:09 AM ______________________________________________________________________   Discussed status with Dr. Posey Pronto on 07/05/17 at 1108 and received telephone approval for admission today.  Admission Coordinator:  Retta Diones, time 1109/Date 07/05/17       Revision History

## 2017-07-07 ENCOUNTER — Inpatient Hospital Stay (HOSPITAL_COMMUNITY): Admitting: Physical Therapy

## 2017-07-07 ENCOUNTER — Inpatient Hospital Stay (HOSPITAL_COMMUNITY): Admitting: Occupational Therapy

## 2017-07-07 ENCOUNTER — Inpatient Hospital Stay (HOSPITAL_COMMUNITY): Admitting: Speech Pathology

## 2017-07-07 DIAGNOSIS — J020 Streptococcal pharyngitis: Secondary | ICD-10-CM

## 2017-07-07 LAB — MAGNESIUM: Magnesium: 2.1 mg/dL (ref 1.7–2.4)

## 2017-07-07 LAB — GLUCOSE, CAPILLARY
Glucose-Capillary: 118 mg/dL — ABNORMAL HIGH (ref 65–99)
Glucose-Capillary: 156 mg/dL — ABNORMAL HIGH (ref 65–99)
Glucose-Capillary: 108 mg/dL — ABNORMAL HIGH (ref 65–99)
Glucose-Capillary: 211 mg/dL — ABNORMAL HIGH (ref 65–99)

## 2017-07-07 MED ORDER — MAGIC MOUTHWASH W/LIDOCAINE
5.0000 mL | Freq: Four times a day (QID) | ORAL | Status: AC
  Administered 2017-07-07 – 2017-07-12 (×18): 5 mL via ORAL
  Filled 2017-07-07 (×20): qty 5

## 2017-07-07 MED ORDER — AMOXICILLIN 500 MG PO CAPS
500.0000 mg | ORAL_CAPSULE | Freq: Three times a day (TID) | ORAL | Status: DC
  Administered 2017-07-07 – 2017-07-12 (×15): 500 mg via ORAL
  Filled 2017-07-07 (×15): qty 1

## 2017-07-07 MED ORDER — PHENOL 1.4 % MT LIQD
1.0000 | OROMUCOSAL | Status: DC | PRN
  Administered 2017-07-07: 1 via OROMUCOSAL
  Filled 2017-07-07: qty 177

## 2017-07-07 NOTE — Progress Notes (Signed)
Occupational Therapy Session Note  Patient Details  Name: Jeffery Thomas MRN: [id number] Date of Birth: Dec 22, 1950  Today's Date: 07/07/2017 OT Individual Time: 9417-4081 OT Individual Time Calculation (min): 60 min    Short Term Goals: Week 1:  OT Short Term Goal 1 (Week 1): STGs=LTGs secondary to short estimated LOS  Skilled Therapeutic Interventions/Progress Updates:    Tx focus on dynamic balance and cognitive remediation during self care tasks.   Pt greeted in recliner with spouse present. Ready for tx. Pt ambulated to bathroom without device and Min A. Max cues for safety while doffing clothing prior to showering. Mod cues for sequencing once bathing on TTB, including proper mgt of hand held shower hose/temperature controls. Afterwards pt ambulated into room to retrieve clothing items, required questioning cues to locate dresser. Pt attempting to don shorts while standing with unilateral support on moving bedside table (due to wheels). Afterwards pt set up putting green in his room (spouse brought from home). Pt played golf with his personal putter. Pt requiring cues throughout for scanning, sequencing, and safety when stooping to floor to retrieve golf balls. Pt trying to rely on unstable surfaces for balance support. He then ambulated to family room to prepare a cup of coffee. Pt requiring instruction cues for tasks. Min cues for pathfinding his way back. Pt left in recliner with all needs within reach, safety belt applied, and chair alarm activated.   Pt disoriented to situation and time during tx.   Therapy Documentation Precautions:  Precautions Precautions: Fall Precaution Comments: monitor HR Restrictions Weight Bearing Restrictions: No Pain: Pain Assessment Pain Assessment: No/denies pain Pain Score: 0-No pain ADL:      See Function Navigator for Current Functional Status.   Therapy/Group: Individual Therapy  Masaye Gatchalian A Deonna Krummel 07/07/2017, 12:34 PM

## 2017-07-07 NOTE — Progress Notes (Signed)
Social Work Assessment and Plan  Patient Details  Name: Jeffery Thomas MRN: [id number] Date of Birth: 06-Oct-1951  Today's Date: 07/06/2017  Problem List:  Patient Active Problem List   Diagnosis Date Noted  . 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 Cardiac device in situ   . Gastroesophageal reflux disease   . Acute systolic congestive heart failure ([place])   . Takatsuki syndrome   . Leukocytosis   . Prediabetes   . HCAP (healthcare-associated pneumonia)   . Agitation   . Acute encephalopathy   . Bradycardia   . Essential hypertension   . Abdominal distention   . Encounter for imaging study to confirm orogastric (OG) tube placement   . Acute respiratory failure with hypoxia ([place])   . Encounter for central line placement   . Ventricular fibrillation ([place]) 06/28/17  . Sudden cardiac death ([place]) 06-28-2017  . ST elevation myocardial infarction (STEMI) [facility])    Past Medical History:  Past Medical History:  Diagnosis Date  . Hypertension    Past Surgical History:  Past Surgical History:  Procedure Laterality Date  . ICD IMPLANT N/A 07/04/2017   Procedure: ICD Implant;  Surgeon: Thompson Grayer, MD;  Location: [facility];  Service: Cardiovascular;  Laterality: N/A;  . LEFT HEART CATH AND CORONARY ANGIOGRAPHY N/A 2017-06-28   Procedure: LEFT HEART CATH AND CORONARY ANGIOGRAPHY;  Surgeon: Lorretta Harp, MD;  Location: [facility];  Service: Cardiovascular;  Laterality: N/A;   Social History:  reports that he has never smoked. He has never used smokeless tobacco. His alcohol and drug histories are not on file.  Family / Support Systems Marital Status: Married Patient Roles: Spouse, Parent, Other (Comment) (brother; brother-in-law; friend) Spouse/Significant Other: Emmauel Hallums - wife - [phone] Children: Asiah Befort - dtr - [phone] - lives in [place], [place] Anticipated Caregiver: Wife and brother Ability/Limitations of  Caregiver: Wife works 8am to 5 pm.  Pt's brother Acupuncturist from [place], [place]) plans to stay during the day.   Caregiver Availability: 24/7 Family Dynamics: supportive family  Social History Preferred language: English Religion:  Read: Yes Write: Yes Employment Status: Retired (retired Multimedia programmer) Date Retired/Disabled/Unemployed: 2010? Age Retired: 67 Public relations account executive Issues: none reported Guardian/Conservator: MD has determined that pt is not fully capable of making his own decisions.  Wife is next of kin for medicaldecision making purposes.   Abuse/Neglect Physical Abuse: Denies Verbal Abuse: Denies Sexual Abuse: Denies Exploitation of patient/patient's resources: Denies Self-Neglect: Denies  Emotional Status Pt's affect, behavior and adjustment status: Pt was in good spirits during assessment visit.  He reports that is grateful to be living. Recent Psychosocial Issues: Pt reported that life was good PTA and he does not remember the event.   Psychiatric History: none reported Substance Abuse History: none reported  Patient / Family Perceptions, Expectations & Goals Pt/Family understanding of illness & functional limitations: Pt has some understanding of his condition and what happened.  Wife has questions about pt's memory.  CSW to have Neuropsychologist to see pt and suggested that wife come and go through speech therapy with pt this weekend. Premorbid pt/family roles/activities: Pt cooks dinner and plays golf daily. Anticipated changes in roles/activities/participation: Pt hopes to resume these activities as soon as he is able. Pt/family expectations/goals: Pt wants to get better so that he can get back on the golf course.  Community Resources Express Scripts: None Premorbid Home Care/DME Agencies: None Transportation available at discharge: family Resource referrals recommended: Neuropsychology,  Support group (specify)  Discharge Planning Living  Arrangements: Spouse/significant other Support Systems: Spouse/significant other, Children, Other relatives, Friends/neighbors Type of Residence: Private residence Insurance Resources: Multimedia programmer (specify) ([place]) Museum/gallery curator Resources: Fish farm manager, Other (Comment) (wife's income) Museum/gallery curator Screen Referred: No Money Management: Spouse Does the patient have any problems obtaining your medications?: No Home Management: Pt and wife share this, but pt reports he can hire help if needed. Patient/Family Preliminary Plans: Pt plans to return to his home with his brother to come to be with pt during the day. Social Work Anticipated Follow Up Needs: HH/OP, Support Group Expected length of stay: 5 to 7 days  Clinical Impression CSW met with pt and then spoke with pt's wife via telephone to introduce self and role of CSW, as well as to complete assessment.  Wife was pleasantly surprised that pt would only need to be on CIR for a week, as she was told it could be 2 to 3 weeks.  CSW explained pt is doing well and that she is welcome to come go through therapy with pt this weekend so she can see his progress and where he will need support.  Pt's goals are supervision and she is aware of this and confirmed that pt's brother would come to stay with pt while she worked.  CSW to give wife weekend schedule so she can attend therapies and put pt on the neuropsychologist's schedule for Tuesday, September 4.  Wife will bring in pt's PCP information.  CSW will assist with d/c planning and any other needs as they arise.  Miyoko Hashimi, Silvestre Mesi 07/07/2017, 1:35 PM

## 2017-07-07 NOTE — IPOC Note (Signed)
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[place])     Functional Problem List: Nursing Behavior, Safety, Endurance, Medication Management, Pain  PT Balance, Behavior, Endurance, Motor, Perception, Safety  OT Balance, Cognition, Endurance, Safety  SLP Cognition  TR         Basic ADL's: OT Grooming, Bathing, Dressing, Toileting     Advanced  ADL's: OT Simple Meal Preparation, Laundry     Transfers: PT Bed Mobility, Bed to Chair, Car, [employer], Futures trader, Metallurgist: PT Ambulation, Stairs     Additional Impairments: OT None  SLP Social Cognition   Social Interaction, Problem Solving, Memory, Attention, Awareness  TR      Anticipated Outcomes Item Anticipated Outcome  Self Feeding n/a  Swallowing      Basic self-care  Supervision  Toileting  Supervision   Bathroom Transfers Supervision  Bowel/Bladder  Continent, no s/s infection, retention.  Transfers  supervision with LRAD  Locomotion  supervision with LRAD  Communication     Cognition  Min-Mod A   Pain  Managed at goal  Safety/Judgment  Increased safety awareness   Therapy Plan: PT Intensity: Minimum of 1-2 x/day ,45 to 90 minutes PT Frequency: 5 out of 7 days PT Duration Estimated Length of Stay: 1 week OT Intensity: Minimum of 1-2 x/day, 45 to 90 minutes OT Frequency: 5 out of 7 days OT Duration/Estimated Length of Stay: 5-7 days SLP Intensity: Minumum of 1-2 x/day, 30 to 90 minutes SLP Frequency: 3 to 5 out of 7 days SLP Duration/Estimated Length of Stay: 5-7 days    Team Interventions: Nursing Interventions Patient/Family Education, Discharge Planning, Pain Management, Cognitive Remediation/Compensation, Psychosocial Support, Medication Management, Disease Management/Prevention  PT interventions  Ambulation/gait training, Community reintegration, DME/adaptive equipment instruction, Neuromuscular re-education, Psychosocial support, Stair training, UE/LE Strength taining/ROM, UE/LE Coordination activities, Therapeutic Activities, Pain management, Discharge planning, Training and development officer, Cognitive remediation/compensation, Disease management/prevention, Functional mobility training, Patient/family education, Therapeutic Exercise, Visual/perceptual remediation/compensation  OT Interventions Training and development officer, Cognitive remediation/compensation, Community reintegration, Discharge planning, Functional mobility training, Psychosocial support, Therapeutic Activities, UE/LE Coordination activities, Patient/family education, DME/adaptive equipment instruction, Pain management, UE/LE Strength taining/ROM, Therapeutic Exercise, Self Care/advanced ADL retraining  SLP Interventions Cognitive remediation/compensation, Cueing hierarchy, Functional tasks, Patient/family education, Therapeutic Activities, Environmental controls, Internal/external aids  TR Interventions    SW/CM Interventions Discharge Planning, Psychosocial Support, Patient/Family Education   Barriers to Discharge MD  Medical stability  Nursing      PT Behavior    OT Decreased caregiver support    SLP      SW       Team Discharge Planning: Destination: PT-Home ,OT- Home , SLP-Home Projected Follow-up: PT-24 hour supervision/assistance, Outpatient PT, OT-  Outpatient OT, 24 hour supervision/assistance, SLP-24 hour supervision/assistance, Outpatient SLP Projected Equipment Needs: PT-To be determined, OT- To be determined, SLP-None recommended by SLP Equipment Details: PT- , OT-  Patient/family involved in discharge planning: PT- Patient,  OT-Patient, Family member/caregiver, SLP-Patient  MD ELOS: 7 days Medical Rehab Prognosis:  Excellent Assessment: The patient has been admitted for CIR therapies with the diagnosis  of anoxic encephalopathy. The team will be addressing functional mobility, strength, stamina, balance, safety, adaptive techniques and equipment, self-care, bowel and bladder mgt, patient and caregiver education, cognition, behavior, communication, nutrition, ego support. Goals have been set at supervision with mobility and ADL's, min assist for cognition.  Meredith Staggers, MD, FAAPMR      See Team Conference Notes for weekly updates to the plan of care

## 2017-07-07 NOTE — Progress Notes (Signed)
[place] PHYSICAL MEDICINE & REHABILITATION     PROGRESS NOTE    Subjective/Complaints: Had a reasonable night. Slept well. No pain. Ready for therapies today  ROS: Limited due to cognitive/behavioral    Objective: Vital Signs: Blood pressure 119/72, pulse 70, temperature 98.4 F (36.9 C), temperature source Oral, resp. rate 17, height 6\' 2"  (1.88 m), weight 85.4 kg (188 lb 4.4 oz), SpO2 93 %. No results found.  Recent Labs  07/06/17 0557  WBC 10.0  HGB 14.6  HCT 46.3  PLT 291    Recent Labs  07/05/17 0228 07/06/17 0557  NA 138 139  K 4.1 4.4  CL 105 103  GLUCOSE 120* 153*  BUN 18 18  CREATININE 0.84 1.02  CALCIUM 8.6* 8.7*   CBG (last 3)   Recent Labs  07/06/17 1701 07/06/17 2107 07/07/17 0629  GLUCAP 104* 190* 118*    Wt Readings from Last 3 Encounters:  07/05/17 85.4 kg (188 lb 4.4 oz)  07/03/17 85 kg (187 lb 4.8 oz)    Physical Exam:  Constitutional: He is oriented to person, place, and time. He appears well-developed and well-nourished. He is uncooperative.  HENT: white plaques along soft palate, all tender Head: Normocephalic and atraumatic.  Mouth/Throat: mucosa pink/moist.  Eyes: PERRL  Neck: Normal range of motion. Neck supple.  Cardiovascular: RRR without murmur. No JVD    Respiratory: CTA Bilaterally without wheezes or rales. Normal effort    GI: Soft. Bowel sounds are normal. He exhibits no distension. There is no tenderness.  Musculoskeletal: He exhibits edema (Scrotal still present). He exhibits no tenderness.  Neurological: He is alert and oriented to person, place, and time.  Delayed processing, oriented to name not date/place. Attention deficits  Motor: 4+ to 5/5 proximal to distal, sensation normal. A&Ox2  Skin: Skin is warm and dry.  Left chest with dry dressing over ICD placement in place Psychiatric: flat, cooperative.   Assessment/Plan: 1. Functional and cognitive deficits secondary to debility/encephalopathy which  require 3+ hours per day of interdisciplinary therapy in a comprehensive inpatient rehab setting. Physiatrist is providing close team supervision and 24 hour management of active medical problems listed below. Physiatrist and rehab team continue to assess barriers to discharge/monitor patient progress toward functional and medical goals.  Function:  Bathing Bathing position      Bathing parts Body parts bathed by patient: Right arm, Left arm, Chest, Abdomen, Front perineal area, Buttocks, Right upper leg, Left upper leg, Right lower leg, Left lower leg Body parts bathed by helper: Back  Bathing assist Assist Level: Touching or steadying assistance(Pt > 75%)      Upper Body Dressing/Undressing Upper body dressing   What is the patient wearing?: Pull over shirt/dress     Pull over shirt/dress - Perfomed by patient: Thread/unthread right sleeve, Thread/unthread left sleeve, Put head through opening, Pull shirt over trunk          Upper body assist Assist Level: Set up, Touching or steadying assistance(Pt > 75%)   Set up : To obtain clothing/put away  Lower Body Dressing/Undressing Lower body dressing   What is the patient wearing?: Pants, Underwear, Shoes Underwear - Performed by patient: Thread/unthread right underwear leg, Thread/unthread left underwear leg, Pull underwear up/down   Pants- Performed by patient: Thread/unthread right pants leg, Thread/unthread left pants leg, Pull pants up/down           Shoes - Performed by patient: Don/doff right shoe, Don/doff left shoe, Fasten right, Fasten left  Lower body assist Assist for lower body dressing: Touching or steadying assistance (Pt > 75%)      Toileting Toileting   Toileting steps completed by patient: Adjust clothing prior to toileting, Performs perineal hygiene, Adjust clothing after toileting      Toileting assist Assist level: Touching or steadying assistance (Pt.75%)   Transfers Chair/bed  transfer   Chair/bed transfer method: Ambulatory Chair/bed transfer assist level: Touching or steadying assistance (Pt > 75%)       Locomotion Ambulation     Max distance: 200' Assist level: Touching or steadying assistance (Pt > 75%)   Wheelchair Wheelchair activity did not occur: N/A        Cognition Comprehension Comprehension assist level: Understands basic 90% of the time/cues < 10% of the time  Expression Expression assist level: Expresses complex 90% of the time/cues < 10% of the time  Social Interaction Social Interaction assist level: Interacts appropriately with others with medication or extra time (anti-anxiety, antidepressant).  Problem Solving Problem solving assist level: Solves basic 50 - 74% of the time/requires cueing 25 - 49% of the time  Memory Memory assist level: Recognizes or recalls 25 - 49% of the time/requires cueing 50 - 75% of the time   Medical Problem List and Plan: 1.  Cognitive deficits as well as deficits in mobility secondary to encephalopathy and debility.  -continue therapies 2.  DVT Prophylaxis/Anticoagulation: Pharmaceutical: Lovenox 3. Pain Management: ultram prn for pain 4. Mood: LCSW to follow for evaluation and support.  5. Neuropsych: This patient is not capable of making decisions on his own behalf. 6. Skin/Wound Care: Monitor wound for healing.  7. Fluids/Electrolytes/Nutrition: Monitor I/O.   Spironolactone added 8/28  -I personally reviewed the patient's labs today.   8. Kleb/Staph PNA: abx completed 9. Witnessed cardiac arrest: NO driving for 6 months per [place] law--family aware.  10. HTN: Monitor BP bid--continue Cozaar and Spironolactone.  11. Takatsubo CM s/p ICD placement: Pacer precautions. On Cozaar and spironolactone. 12. Leucocytosis: Monitor for signs of infection.  13. Prediabetes: Hgb A1c- 6.3--new diagnosis. RD to educate patient and family on appropriate diet.  14. Encephalopathy: decreased Klonopin to 0.25 mg bid and  wean as able.   -no obvious anxiety on exam today 15. GERD: On Protonix 16. ?Strep throat: amoxicillin for 7 days,   -chloraseptic spray, MMW with lidocaine also    LOS (Days) 2 A FACE TO FACE EVALUATION WAS PERFORMED  Alger Simons T, MD 07/07/2017 9:59 AM

## 2017-07-07 NOTE — Progress Notes (Signed)
Physical Therapy Session Note  Patient Details  Name: Jeffery Thomas MRN: [id number] Date of Birth: January 04, 1951  Today's Date: 07/07/2017 PT Individual Time: [phone] and 1434-1500 PT Individual Time Calculation (min): 65 min and 26 min   Short Term Goals: Week 1:  PT Short Term Goal 1 (Week 1): STG = LTG due to short ELOS.  Skilled Therapeutic Interventions/Progress Updates:  Treatment 1: Pt received in room & agreeable to tx. Pt able to ambulate throughout room with supervision<>steady assist for balance to retreive then don tennis shoes. Pt ambulates unit<>outside with supervision<>steady assist 2/2 impaired safety awareness and decreased reaction. Pt negotiates uneven surfaces (brick, incline, declines, steps with 1 rail) outside to simulate community mobility. Pt requires min/mod assist when descending stairs to prevent several losses of balance 2/2 impaired eccentric control, foot placement, and awareness. Pt engaged in conversation; pt able to recall current year but unable to calculate his age and unable to recall correct month. Pt able to pathfind back to unit with cuing to push button to call elevator. Pt utilized cybex kinetron in standing with and without UE support with task focusing on BLE strengthening, balance, and NMR; pt required min assist for balance. Pt engaged in small pipe tree assembly with 2 simple shapes from pre-selected pieces; pt required 30% cuing initially to begin correct shape but then able to complete rest of it. At end of session pt left sitting in recliner in room with chair alarm & QRB donned & all needs within reach, wife present in room. Pt's wife present at end of session and therapist educated her on today's activities, pt's CLOF, and cognitive/memory deficits.  After using kinetron: HR = 78-82 bpm, SpO2 = 97%   Treatment 2: Pt received in room & agreeable to tx. Pt noting his throat still hurts. Pt ambulated throughout unit without AD and min assist slowly  progressing to close supervision. Pt easily distracted and with poor righting responses therefore will lose balance frequently. Pt performed floor transfer x 2 trials with steady assist<>supervision; pt able to transfer from floor to standing by pushing up on stable object. Pt engaged in calendar assembly with max cuing for orientation to day of week and the date. Pt also required min cuing to fill in dates on calendar for the month of August. At end of session pt left sitting in recliner in room with QRB & chair alarm donned & wife present to supervise. Pt's wife plans to be present for therapies tomorrow for family education.  Therapy Documentation Precautions:  Precautions Precautions: Fall Precaution Comments: monitor HR Restrictions Weight Bearing Restrictions: No   See Function Navigator for Current Functional Status.   Therapy/Group: Individual Therapy  [name] 07/07/2017, 3:29 PM

## 2017-07-07 NOTE — Progress Notes (Signed)
Speech Language Pathology Daily Session Note  Patient Details  Name: Jeffery Thomas MRN: [id number] Date of Birth: June 07, 1951  Today's Date: 07/07/2017 SLP Individual Time: 0730-0830 SLP Individual Time Calculation (min): 60 min  Short Term Goals: Week 1: SLP Short Term Goal 1 (Week 1): STGs=LTGs  Skilled Therapeutic Interventions: Skilled treatment session focused on cognitive goals. SLP facilitated session by providing external aids and Mod A verbal cues for recall of the current date. Patient reported that prior to hospitalization, he utilized his phone to recall appointments and record notes. Therefore, SLP facilitated session by providing Max A verbal cues for problem solving while trying to input his daily therapy schedule into his phone. Patient ambulated to and from the room with the RW with Mod verbal cues for safety due to impulsivity. Patient left upright in recliner with quick release belt in place, chair alarm on and all needs within reach. Continue with current plan of care.      Function:   Cognition Comprehension Comprehension assist level: Understands basic 90% of the time/cues < 10% of the time  Expression   Expression assist level: Expresses complex 90% of the time/cues < 10% of the time  Social Interaction Social Interaction assist level: Interacts appropriately with others with medication or extra time (anti-anxiety, antidepressant).  Problem Solving Problem solving assist level: Solves basic 50 - 74% of the time/requires cueing 25 - 49% of the time  Memory Memory assist level: Recognizes or recalls 25 - 49% of the time/requires cueing 50 - 75% of the time    Pain Pain Assessment Pain Assessment: No/denies pain Pain Score: 0-No pain  Therapy/Group: Individual Therapy  Jeffery Thomas 07/07/2017, 10:54 AM

## 2017-07-08 ENCOUNTER — Inpatient Hospital Stay (HOSPITAL_COMMUNITY): Admitting: Physical Therapy

## 2017-07-08 ENCOUNTER — Inpatient Hospital Stay (HOSPITAL_COMMUNITY): Admitting: Occupational Therapy

## 2017-07-08 ENCOUNTER — Inpatient Hospital Stay (HOSPITAL_COMMUNITY): Admitting: Speech Pathology

## 2017-07-08 DIAGNOSIS — R7303 Prediabetes: Secondary | ICD-10-CM

## 2017-07-08 DIAGNOSIS — I10 Essential (primary) hypertension: Secondary | ICD-10-CM

## 2017-07-08 DIAGNOSIS — Z8674 Personal history of sudden cardiac arrest: Secondary | ICD-10-CM

## 2017-07-08 DIAGNOSIS — J020 Streptococcal pharyngitis: Secondary | ICD-10-CM

## 2017-07-08 DIAGNOSIS — D72829 Elevated white blood cell count, unspecified: Secondary | ICD-10-CM

## 2017-07-08 LAB — GLUCOSE, CAPILLARY
GLUCOSE-CAPILLARY: 158 mg/dL — AB (ref 65–99)
GLUCOSE-CAPILLARY: 125 mg/dL — AB (ref 65–99)
GLUCOSE-CAPILLARY: 118 mg/dL — AB (ref 65–99)
Glucose-Capillary: 97 mg/dL (ref 65–99)

## 2017-07-08 LAB — MAGNESIUM: MAGNESIUM: 2.3 mg/dL (ref 1.7–2.4)

## 2017-07-08 MED ORDER — METFORMIN HCL 500 MG PO TABS
500.0000 mg | ORAL_TABLET | Freq: Every day | ORAL | Status: DC
  Administered 2017-07-08 – 2017-07-10 (×3): 500 mg via ORAL
  Filled 2017-07-08 (×3): qty 1

## 2017-07-08 NOTE — Progress Notes (Signed)
Occupational Therapy Session Note  Patient Details  Name: Jeffery Thomas MRN: [id number] Date of Birth: 27-Jul-1951  Today's Date: 07/08/2017 OT Individual Time: 1250-1413 OT Individual Time Calculation (min): 83 min    Short Term Goals: Week 1:  OT Short Term Goal 1 (Week 1): STGs=LTGs secondary to short estimated LOS  Skilled Therapeutic Interventions/Progress Updates:    Upon entering the room, pt seated in recliner chair with wife present. Pt with no c/o pain this session and agreeable to OT intervention. Pt writing down familiar recipe of his favorite dish with max multimodal cues from wife for proper ingredient selection. Pt ambulating to ADL apartment with supervision for safety. Pt performed various furniture transfers onto recliner chair, sofa, and bed similar to home environment with overall supervision and min cues for safety. Pt is unable to recall home set up and asking wife various questions about furniture in the home. Pt engaged in bed making activity with supervision - min A for safety and impulsive behavior. Pt navigating outside with mod multimodal cues to locate exit. Pt ambulating on grass and uneven mulch surface with steady assistance for safety. Pt returning to room at end of session and seated in recliner chair. Call bell and all needed items within reach. Wife remains present in room. Quick release belt donned.   Therapy Documentation Precautions:  Precautions Precautions: Fall Precaution Comments: monitor HR Restrictions Weight Bearing Restrictions: No  See Function Navigator for Current Functional Status.   Therapy/Group: Individual Therapy  Gypsy Decant 07/08/2017, 3:36 PM

## 2017-07-08 NOTE — Progress Notes (Signed)
[place] PHYSICAL MEDICINE & REHABILITATION     PROGRESS NOTE    Subjective/Complaints: Patient seen lying in bed this morning. He slept well overnight. Patient is doing much better and he agrees. He states the plan is for him to DC on Monday or Tuesday.  ROS: Denies CP, SOB, nausea, vomiting, diarrhea.   Objective: Vital Signs: Blood pressure 139/86, pulse 77, temperature 97.8 F (36.6 C), temperature source Axillary, resp. rate 18, height 6\' 2"  (1.88 m), weight 85.4 kg (188 lb 4.4 oz), SpO2 98 %. No results found.  Recent Labs  07/06/17 0557  WBC 10.0  HGB 14.6  HCT 46.3  PLT 291    Recent Labs  07/06/17 0557  NA 139  K 4.4  CL 103  GLUCOSE 153*  BUN 18  CREATININE 1.02  CALCIUM 8.7*   CBG (last 3)   Recent Labs  07/07/17 1637 07/07/17 2129 07/08/17 0645  GLUCAP 108* 211* 158*    Wt Readings from Last 3 Encounters:  07/05/17 85.4 kg (188 lb 4.4 oz)  07/03/17 85 kg (187 lb 4.8 oz)    Physical Exam:  Constitutional: He appears well-developed and well-nourished. NAD. Head: Normocephalic and atraumatic.  Eyes: EOMI. No discharge.  Cardiovascular: RRR without murmur. No JVD    Respiratory: CTA Bilaterally. Normal effort    GI: Bowel sounds are normal. He exhibits no distension. Musculoskeletal: He exhibits edema (Scrotal still present), not examined today. He exhibits no tenderness.  Neurological: He is alert and oriented.  A&Ox3 Motor: 5/5 proximal to distal, sensation normal.  Skin: Skin is warm and dry.  Left chest with dry dressing over ICD placement in place Psychiatric: Normal mood and behavior, cooperative.   Assessment/Plan: 1. Functional and cognitive deficits secondary to debility/encephalopathy which require 3+ hours per day of interdisciplinary therapy in a comprehensive inpatient rehab setting. Physiatrist is providing close team supervision and 24 hour management of active medical problems listed below. Physiatrist and rehab team  continue to assess barriers to discharge/monitor patient progress toward functional and medical goals.  Function:  Bathing Bathing position   Position: Shower  Bathing parts Body parts bathed by patient: Right arm, Left arm, Chest, Abdomen, Front perineal area, Buttocks, Right upper leg, Left upper leg, Right lower leg, Left lower leg Body parts bathed by helper: Back  Bathing assist Assist Level: Touching or steadying assistance(Pt > 75%)      Upper Body Dressing/Undressing Upper body dressing   What is the patient wearing?: Pull over shirt/dress     Pull over shirt/dress - Perfomed by patient: Thread/unthread right sleeve, Thread/unthread left sleeve, Put head through opening, Pull shirt over trunk          Upper body assist Assist Level: Set up, Touching or steadying assistance(Pt > 75%)   Set up : To obtain clothing/put away  Lower Body Dressing/Undressing Lower body dressing   What is the patient wearing?: Pants, Underwear, Shoes, Non-skid slipper socks Underwear - Performed by patient: Thread/unthread right underwear leg, Thread/unthread left underwear leg, Pull underwear up/down   Pants- Performed by patient: Thread/unthread right pants leg, Thread/unthread left pants leg, Pull pants up/down   Non-skid slipper socks- Performed by patient: Don/doff right sock, Don/doff left sock       Shoes - Performed by patient: Don/doff right shoe, Don/doff left shoe, Fasten right, Fasten left            Lower body assist Assist for lower body dressing: Touching or steadying assistance (Pt > 75%)  Toileting Toileting   Toileting steps completed by patient: Adjust clothing prior to toileting, Adjust clothing after toileting, Performs perineal hygiene   Toileting Assistive Devices: Grab bar or rail  Toileting assist Assist level: Touching or steadying assistance (Pt.75%)   Transfers Chair/bed transfer   Chair/bed transfer method: Ambulatory Chair/bed transfer assist  level: Touching or steadying assistance (Pt > 75%)       Locomotion Ambulation     Max distance: >200 ft Assist level: Touching or steadying assistance (Pt > 75%)   Wheelchair Wheelchair activity did not occur: N/A        Cognition Comprehension Comprehension assist level: Understands basic 90% of the time/cues < 10% of the time  Expression Expression assist level: Expresses basic 90% of the time/requires cueing < 10% of the time.  Social Interaction Social Interaction assist level: Interacts appropriately with others with medication or extra time (anti-anxiety, antidepressant).  Problem Solving Problem solving assist level: Solves basic 50 - 74% of the time/requires cueing 25 - 49% of the time  Memory Memory assist level: Recognizes or recalls 25 - 49% of the time/requires cueing 50 - 75% of the time   Medical Problem List and Plan: 1.  Cognitive deficits as well as deficits in mobility secondary to encephalopathy and debility.  -continue therapies 2.  DVT Prophylaxis/Anticoagulation: Pharmaceutical: Lovenox 3. Pain Management: ultram prn for pain 4. Mood: LCSW to follow for evaluation and support.  5. Neuropsych: This patient is ?capable of making decisions on his own behalf. 6. Skin/Wound Care: Monitor wound for healing.  7. Fluids/Electrolytes/Nutrition: Monitor I/Os.   Spironolactone added 8/28 8. Kleb/Staph PNA: abx completed 9. Witnessed cardiac arrest: NO driving for 6 months per [place] law--family aware.  10. HTN: Monitor BP bid--continue Cozaar and Spironolactone.   Relatively controlled on 9/1 11. Takatsubo CM s/p ICD placement: Pacer precautions. On Cozaar and spironolactone. 12. Leucocytosis: Resolved  Monitor for signs of infection.  13. Prediabetes: Hgb A1c- 6.3--new diagnosis. RD to educate patient and family on appropriate diet.   Metformin 500 daily started on 9/1 14. Encephalopathy: decreased Klonopin to 0.25 mg bid and wean as able.   -no obvious anxiety on  exam today 15. GERD: On Protonix 16. ?Strep throat: amoxicillin for 7 days,   -chloraseptic spray, MMW with lidocaine also   LOS (Days) 3 A FACE TO FACE EVALUATION WAS PERFORMED  Aroush Chasse Lorie Phenix, MD 07/08/2017 7:57 AM

## 2017-07-08 NOTE — Progress Notes (Signed)
Speech Language Pathology Daily Session Note  Patient Details  Name: Jeffery Thomas MRN: [id number] Date of Birth: 11-23-1950  Today's Date: 07/08/2017 SLP Individual Time: 1415-1515 SLP Individual Time Calculation (min): 60 min  Short Term Goals: Week 1: SLP Short Term Goal 1 (Week 1): STGs=LTGs  Skilled Therapeutic Interventions: Skilled treatment session focused on cognition goals. Wife present and current cognitive deficits shared with wife and pt. Pt required Max A cues to locate calendar app on phone and to type appropriate information in note app. Pt continued to require Max A to locate calendar and note app even with multiple opportunities to practice. Therefore, we rearranged the presentation of his apps.   The following recommendations were made to his wife regarding discharge planning:  1. When learning a new task, don't joke around (at least try to look serious)  2. It is important to create a scheduled routine for his day, everything has a place and there is a place for every item at home - gave task to put cell phone in specific place in his current room to begin habit  3. I decreased the number of apps on the home screen on his cell phone to make his calendar and notes app more salient  gave general information about compensatory strategies for memory loss (repetition, note taking etc)  discussed that his brain currently doesn't filter out competing thoughts and he becomes easily distracted by each thought - making him a safety risk  4. Reiterated the point that he is a HUGE safety risk at home and requires 24 hour active supervision - should not engage in cooking tasks, washing car - and he is unable to recall 911 for emergency  5. Requested wife take pictures of each room at home for him to review as he doesn't remember their home or furniture  Pt was returned to room, left upright in recliner with safety belt donned and all needs within reach. Pt's wife didn't have any  further questions. Continue per current plan of care.       Function:    Cognition Comprehension Comprehension assist level: Understands basic 90% of the time/cues < 10% of the time  Expression   Expression assist level: Expresses basic 90% of the time/requires cueing < 10% of the time.  Social Interaction Social Interaction assist level: Interacts appropriately with others with medication or extra time (anti-anxiety, antidepressant).  Problem Solving Problem solving assist level: Solves basic 50 - 74% of the time/requires cueing 25 - 49% of the time  Memory Memory assist level: Recognizes or recalls 25 - 49% of the time/requires cueing 50 - 75% of the time    Pain    Therapy/Group: Individual Therapy  Luke Falero 07/08/2017, 4:02 PM

## 2017-07-08 NOTE — Progress Notes (Signed)
Physical Therapy Session Note  Patient Details  Name: Jeffery Thomas MRN: [id number] Date of Birth: 12/03/1950  Today's Date: 07/08/2017 PT Individual Time: 0830-0945 PT Individual Time Calculation (min): 75 min   Short Term Goals: Week 1:  PT Short Term Goal 1 (Week 1): STG = LTG due to short ELOS.  Skilled Therapeutic Interventions/Progress Updates:  Pt received in room with wife [name]) present for caregiver training. Session focused on functional mobility and cognitive remediation with therapist educating Nadine on pt's need for 24 hr supervision upon d/c. Pt negotiated 4 steps x 3 trials without rails and steady assist to simulate home environment. Educated pt & wife on need for pt to stay on main level instead of going to basement until pt's balance, strength & endurance improves. Pt completed car transfer from SUV & low sedan simulated height with supervision overall. Pt completes floor transfer with supervision. Pt's wife participated in hands on training with all functional mobility and was checked off to assist pt with ambulating throughout room. Pt ambulated room>outside>2H unit>back to 4West unit with close supervision. Outside pt negotiated inclines/declines, steps, and uneven surfaces without AD and close supervision/steady assist. Back on unit pt's HR = 85 bpm, SpO2 = 96%. Pt engaged in small pipe tree assembly requiring max cuing to assembly simple shape from choice of many. Pt appears impulsive with choices and demonstrates impaired problem solving and error correction abilities. Educated pt on various cognitive tasks pt can engage in at home (word search, cross words, simple puzzles, simple arithmetic books). Pt educated on inability to drive and need to defer playing golf and going to gym until finished/cleared by OPPT and MD. Pt then attempted to instruct therapist on how to play Backgammon on tablet but pt unable to provide instructions and education. At end of session pt left in  bed in room with wife present. Pt's wife voices no further questions at this time.    Therapy Documentation Precautions:  Precautions Precautions: Fall Precaution Comments: monitor HR Restrictions Weight Bearing Restrictions: No   See Function Navigator for Current Functional Status.   Therapy/Group: Individual Therapy  [name] 07/08/2017, 12:14 PM

## 2017-07-09 DIAGNOSIS — I10 Essential (primary) hypertension: Secondary | ICD-10-CM

## 2017-07-09 LAB — GLUCOSE, CAPILLARY
GLUCOSE-CAPILLARY: 111 mg/dL — AB (ref 65–99)
Glucose-Capillary: 108 mg/dL — ABNORMAL HIGH (ref 65–99)
Glucose-Capillary: 144 mg/dL — ABNORMAL HIGH (ref 65–99)
Glucose-Capillary: 137 mg/dL — ABNORMAL HIGH (ref 65–99)

## 2017-07-09 LAB — MAGNESIUM: Magnesium: 2.2 mg/dL (ref 1.7–2.4)

## 2017-07-09 NOTE — Progress Notes (Signed)
[facility] PHYSICAL MEDICINE & REHABILITATION     PROGRESS NOTE    Subjective/Complaints: Patient seen lying in bed this morning. He states he slept well overnight. He denies complaints looks forward to going home soon.  ROS: Denies CP, SOB, nausea, vomiting, diarrhea.   Objective: Vital Signs: Blood pressure 128/83, pulse 63, temperature 98.8 F (37.1 C), temperature source Oral, resp. rate 18, height 6\' 2"  (1.88 m), weight 85.4 kg (188 lb 4.4 oz), SpO2 96 %. No results found. No results for input(s): WBC, HGB, HCT, PLT in the last 72 hours. No results for input(s): NA, K, CL, GLUCOSE, BUN, CREATININE, CALCIUM in the last 72 hours.  Invalid input(s): CO CBG (last 3)   Recent Labs  07/08/17 1642 07/08/17 2104 07/09/17 0659  GLUCAP 125* 118* 108*    Wt Readings from Last 3 Encounters:  07/05/17 85.4 kg (188 lb 4.4 oz)  07/03/17 85 kg (187 lb 4.8 oz)    Physical Exam:  Constitutional: He appears well-developed and well-nourished. NAD. Head: Normocephalic and atraumatic.  Eyes: EOMI. No discharge.  Cardiovascular: RRR without murmur. No JVD    Respiratory: CTA Bilaterally. Normal effort    GI: Bowel sounds are normal. He exhibits no distension. Musculoskeletal: He exhibits edema (Scrotal still present), not examined today. He exhibits no tenderness.  Neurological: He is alert and oriented.  A&Ox3 Motor: 5/5 proximal to distal (unchanged).  Skin: Skin is warm and dry.  Left chest with dry dressing over ICD placement in place Psychiatric: Normal mood and behavior, cooperative.   Assessment/Plan: 1. Functional and cognitive deficits secondary to debility/encephalopathy which require 3+ hours per day of interdisciplinary therapy in a comprehensive inpatient rehab setting. Physiatrist is providing close team supervision and 24 hour management of active medical problems listed below. Physiatrist and rehab team continue to assess barriers to discharge/monitor patient  progress toward functional and medical goals.  Function:  Bathing Bathing position   Position: Shower  Bathing parts Body parts bathed by patient: Right arm, Left arm, Chest, Abdomen, Front perineal area, Buttocks, Right upper leg, Left upper leg, Right lower leg, Left lower leg Body parts bathed by helper: Back  Bathing assist Assist Level: Touching or steadying assistance(Pt > 75%)      Upper Body Dressing/Undressing Upper body dressing   What is the patient wearing?: Pull over shirt/dress     Pull over shirt/dress - Perfomed by patient: Thread/unthread right sleeve, Thread/unthread left sleeve, Put head through opening, Pull shirt over trunk          Upper body assist Assist Level: Set up, Touching or steadying assistance(Pt > 75%)   Set up : To obtain clothing/put away  Lower Body Dressing/Undressing Lower body dressing   What is the patient wearing?: Pants, Underwear, Shoes, Non-skid slipper socks Underwear - Performed by patient: Thread/unthread right underwear leg, Thread/unthread left underwear leg, Pull underwear up/down   Pants- Performed by patient: Thread/unthread right pants leg, Thread/unthread left pants leg, Pull pants up/down   Non-skid slipper socks- Performed by patient: Don/doff right sock, Don/doff left sock       Shoes - Performed by patient: Don/doff right shoe, Don/doff left shoe, Fasten right, Fasten left            Lower body assist Assist for lower body dressing: Touching or steadying assistance (Pt > 75%)      Toileting Toileting   Toileting steps completed by patient: Adjust clothing prior to toileting, Performs perineal hygiene, Adjust clothing after toileting  Toileting Assistive Devices: Training and development officer Assist level: Touching or steadying assistance (Pt.75%)   Transfers Chair/bed transfer   Chair/bed transfer method: Ambulatory Chair/bed transfer assist level: Touching or steadying assistance (Pt > 75%)        Locomotion Ambulation     Max distance: >500 ft Assist level: Supervision or verbal cues   Wheelchair Wheelchair activity did not occur: N/A        Cognition Comprehension Comprehension assist level: Understands basic 90% of the time/cues < 10% of the time  Expression Expression assist level: Expresses basic 90% of the time/requires cueing < 10% of the time.  Social Interaction Social Interaction assist level: Interacts appropriately with others with medication or extra time (anti-anxiety, antidepressant).  Problem Solving Problem solving assist level: Solves basic 50 - 74% of the time/requires cueing 25 - 49% of the time  Memory Memory assist level: Recognizes or recalls 25 - 49% of the time/requires cueing 50 - 75% of the time   Medical Problem List and Plan: 1.  Cognitive deficits as well as deficits in mobility secondary to encephalopathy and debility.  -continue therapies 2.  DVT Prophylaxis/Anticoagulation: Pharmaceutical: Lovenox 3. Pain Management: ultram prn for pain 4. Mood: LCSW to follow for evaluation and support.  5. Neuropsych: This patient is ?capable of making decisions on his own behalf. 6. Skin/Wound Care: Monitor wound for healing.  7. Fluids/Electrolytes/Nutrition: Monitor I/Os.   Spironolactone added 8/28 8. Kleb/Staph PNA: abx completed 9. Witnessed cardiac arrest: NO driving for 6 months per  law--family aware.  10. HTN: Monitor BP bid--continue Cozaar and Spironolactone.   Relatively controlled on 9/2 11. Takatsubo CM s/p ICD placement: Pacer precautions. On Cozaar and spironolactone. 12. Leucocytosis: Resolved  Monitor for signs of infection.  13. Prediabetes: Hgb A1c- 6.3--new diagnosis. RD to educate patient and family on appropriate diet.   Metformin 500 daily started on 9/1  Improving 14. Encephalopathy: decreased Klonopin to 0.25 mg bid and wean as able.   -no obvious anxiety on exam today 15. GERD: On Protonix 16. ?Strep throat: amoxicillin  for 7 days,   -chloraseptic spray, MMW with lidocaine also   LOS (Days) 4 A FACE TO FACE EVALUATION WAS PERFORMED  Nikko Quast Lorie Phenix, MD 07/09/2017 8:03 AM

## 2017-07-10 ENCOUNTER — Inpatient Hospital Stay (HOSPITAL_COMMUNITY): Admitting: Speech Pathology

## 2017-07-10 ENCOUNTER — Inpatient Hospital Stay (HOSPITAL_COMMUNITY): Admitting: Occupational Therapy

## 2017-07-10 ENCOUNTER — Inpatient Hospital Stay (HOSPITAL_COMMUNITY): Admitting: Physical Therapy

## 2017-07-10 ENCOUNTER — Inpatient Hospital Stay (HOSPITAL_COMMUNITY)

## 2017-07-10 LAB — GLUCOSE, CAPILLARY
GLUCOSE-CAPILLARY: 112 mg/dL — AB (ref 65–99)
Glucose-Capillary: 95 mg/dL (ref 65–99)
Glucose-Capillary: 121 mg/dL — ABNORMAL HIGH (ref 65–99)
Glucose-Capillary: 102 mg/dL — ABNORMAL HIGH (ref 65–99)

## 2017-07-10 LAB — MAGNESIUM: MAGNESIUM: 2.1 mg/dL (ref 1.7–2.4)

## 2017-07-10 MED ORDER — CLONAZEPAM 0.5 MG PO TABS: 0.2500 mg | ORAL_TABLET | Freq: Two times a day (BID) | ORAL | Status: DC | PRN

## 2017-07-10 NOTE — Progress Notes (Signed)
Physical Therapy Session Note  Patient Details  Name: CALIBER LANDESS MRN: [id number] Date of Birth: December 06, 1950  Today's Date: 07/10/2017 PT Individual Time: 0905-1005 PT Individual Time Calculation (min): 60 min   Short Term Goals: Week 1:  PT Short Term Goal 1 (Week 1): STG = LTG due to short ELOS.  Skilled Therapeutic Interventions/Progress Updates:  Pt received in room & agreeable to tx, denying c/o pain. Pt unable to recall therapist's name and requires total cuing to utilize name tag. Pt reports he's also unable to recall his wife's name and reasoning for impaired memory with therapist providing total assist for awareness. Pt ambulates throughout unit without AD & supervision. Pt negotiates 8 steps without rails with supervision. Pt utilized cybex kinetron in standing with UE support progressing to no UE support but min assist with task focusing on BLE strengthening, weight shifting, NMR, and balance; during task HR = 72 bpm, SpO2 = 99%. Pt engaged in game of putt-putting with supervision assist for balance. Pt required max assist for keeping up with score during game. Pt engaged in game of Headbands without assistance to recall and describe words. Pt utilized nu-step on level 4 x 10 minutes with BLE only with task focusing on BLE strengthening & endurance training. At end of session pt left sitting in recliner with QRB & chair alarm donned & all needs within reach.   Therapy Documentation Precautions:  Precautions Precautions: Fall Precaution Comments: monitor HR Restrictions Weight Bearing Restrictions: No   See Function Navigator for Current Functional Status.   Therapy/Group: Individual Therapy  [name] 07/10/2017, 10:11 AM

## 2017-07-10 NOTE — Progress Notes (Signed)
Speech Language Pathology Daily Session Note  Patient Details  Name: Jeffery Thomas MRN: [id number] Date of Birth: [birth year]  Today's Date: 07/10/2017 SLP Individual Time: 1030-1110 SLP Individual Time Calculation (min): 40 min  Short Term Goals: Week 1: SLP Short Term Goal 1 (Week 1): STGs=LTGs  Skilled Therapeutic Interventions: Skilled treatment session focused on cognitive goals. SLP facilitated session by providing Max A verbal cues for patient to utilize his phone to record his therapy appointments and events from previous therapy sessions. Patient required overall Mod A verbal cues for navigating his phone. Patient also answered basic orientation/biorgaphical questions that were generated by his family with 100% accuracy and supervision verbal cues. Patient left upright in recliner with chair alarm and quick release belt in place and all needs within reach. Continue with current plan of care.      Function:  Cognition Comprehension Comprehension assist level: Understands basic 90% of the time/cues < 10% of the time  Expression   Expression assist level: Expresses basic 90% of the time/requires cueing < 10% of the time.  Social Interaction Social Interaction assist level: Interacts appropriately with others with medication or extra time (anti-anxiety, antidepressant).  Problem Solving Problem solving assist level: Solves basic 50 - 74% of the time/requires cueing 25 - 49% of the time  Memory Memory assist level: Recognizes or recalls 50 - 74% of the time/requires cueing 25 - 49% of the time    Pain No/Denies Pain   Therapy/Group: Individual Therapy  [name] 07/10/2017, 12:31 PM

## 2017-07-10 NOTE — Progress Notes (Signed)
Pt has home CPAP machine but states that he isn't going to wear it tonight.  RT to monitor and assess as needed.

## 2017-07-10 NOTE — Progress Notes (Signed)
[facility] PHYSICAL MEDICINE & REHABILITATION     PROGRESS NOTE    Subjective/Complaints: Pt up with PT. Feels well. Notes improvement overall  ROS: pt denies nausea, vomiting, diarrhea, cough, shortness of breath or chest pain    Objective: Vital Signs: Blood pressure 116/82, pulse 67, temperature 97.9 F (36.6 C), temperature source Oral, resp. rate 16, height 6\' 2"  (1.88 m), weight 85.4 kg (188 lb 4.4 oz), SpO2 95 %. No results found. No results for input(s): WBC, HGB, HCT, PLT in the last 72 hours. No results for input(s): NA, K, CL, GLUCOSE, BUN, CREATININE, CALCIUM in the last 72 hours.  Invalid input(s): CO CBG (last 3)   Recent Labs  07/09/17 2128 07/10/17 0638 07/10/17 1147  GLUCAP 137* 112* 95    Wt Readings from Last 3 Encounters:  07/05/17 85.4 kg (188 lb 4.4 oz)  07/03/17 85 kg (187 lb 4.8 oz)    Physical Exam:  Constitutional: He appears well-developed and well-nourished. NAD. Head: Normocephalic and atraumatic.  Eyes: EOMI. No discharge.  Cardiovascular: RRR without murmur. No JVD     Respiratory: CTA Bilaterally without wheezes or rales. Normal effort     GI: Bowel sounds are normal. He exhibits no distension. Musculoskeletal: He exhibits decreased scrotal edema. He exhibits no tenderness.  Neurological: He is alert and oriented.  A&Ox3 Motor: 5/5 proximal to distal in all 4's  Oriented to room number, place. Could not remember my or therapist's name  Skin: Skin is warm and dry.  Left chest with dry dressing over ICD placement in place Psychiatric: Normal mood and behavior, cooperative.   Assessment/Plan: 1. Functional and cognitive deficits secondary to debility/encephalopathy which require 3+ hours per day of interdisciplinary therapy in a comprehensive inpatient rehab setting. Physiatrist is providing close team supervision and 24 hour management of active medical problems listed below. Physiatrist and rehab team continue to assess barriers to  discharge/monitor patient progress toward functional and medical goals.  Function:  Bathing Bathing position   Position: Shower  Bathing parts Body parts bathed by patient: Right arm, Left arm, Chest, Abdomen, Front perineal area, Buttocks, Right upper leg, Left upper leg, Right lower leg, Left lower leg Body parts bathed by helper: Back  Bathing assist Assist Level: Supervision or verbal cues      Upper Body Dressing/Undressing Upper body dressing   What is the patient wearing?: Pull over shirt/dress     Pull over shirt/dress - Perfomed by patient: Thread/unthread right sleeve, Thread/unthread left sleeve, Put head through opening, Pull shirt over trunk          Upper body assist Assist Level: Supervision or verbal cues   Set up : To obtain clothing/put away  Lower Body Dressing/Undressing Lower body dressing   What is the patient wearing?: Pants, Underwear, Shoes, Socks Underwear - Performed by patient: Thread/unthread right underwear leg, Thread/unthread left underwear leg, Pull underwear up/down   Pants- Performed by patient: Thread/unthread right pants leg, Thread/unthread left pants leg, Pull pants up/down   Non-skid slipper socks- Performed by patient: Don/doff right sock, Don/doff left sock   Socks - Performed by patient: Don/doff right sock, Don/doff left sock   Shoes - Performed by patient: Don/doff right shoe, Don/doff left shoe, Fasten right, Fasten left            Lower body assist Assist for lower body dressing: Touching or steadying assistance (Pt > 75%)      Toileting Toileting   Toileting steps completed by patient: Adjust clothing  prior to toileting, Performs perineal hygiene, Adjust clothing after toileting   Toileting Assistive Devices: Grab bar or rail  Toileting assist Assist level: Touching or steadying assistance (Pt.75%)   Transfers Chair/bed transfer   Chair/bed transfer method: Ambulatory Chair/bed transfer assist level: Supervision or  verbal cues       Locomotion Ambulation     Max distance: 150 ft Assist level: Supervision or verbal cues   Wheelchair Wheelchair activity did not occur: N/A        Cognition Comprehension Comprehension assist level: Understands basic 90% of the time/cues < 10% of the time  Expression Expression assist level: Expresses basic 90% of the time/requires cueing < 10% of the time.  Social Interaction Social Interaction assist level: Interacts appropriately with others with medication or extra time (anti-anxiety, antidepressant).  Problem Solving Problem solving assist level: Solves basic 50 - 74% of the time/requires cueing 25 - 49% of the time  Memory Memory assist level: Recognizes or recalls 25 - 49% of the time/requires cueing 50 - 75% of the time   Medical Problem List and Plan: 1.  Cognitive deficits as well as deficits in mobility secondary to encephalopathy and debility.  -continue therapies  -progressing toward dc. Improving cognition 2.  DVT Prophylaxis/Anticoagulation: Pharmaceutical: Lovenox 3. Pain Management: ultram prn for pain 4. Mood: LCSW to follow for evaluation and support.  5. Neuropsych: This patient is ?capable of making decisions on his own behalf. 6. Skin/Wound Care: Monitor wound for healing.  7. Fluids/Electrolytes/Nutrition: Monitor I/Os.   Spironolactone added 8/28 8. Kleb/Staph PNA: abx completed 9. Witnessed cardiac arrest: NO driving for 6 months per [place] law--family aware.  10. HTN: Monitor BP bid--continue Cozaar and Spironolactone.   Relatively controlled on 9/3 11. Takatsubo CM s/p ICD placement: Pacer precautions. On Cozaar and spironolactone. 12. Leucocytosis: Resolved  Monitor for signs of infection.  13. Prediabetes: Hgb A1c- 6.3--new diagnosis. RD to educate patient and family on appropriate diet.   Metformin 500 daily started on 9/1. Is this needed?? Would change to carb mod diet first---dc metformin    14. Encephalopathy: decreased Klonopin  to 0.25 mg bid and wean as able.   -no obvious anxiety on exam today- change to prn 15. GERD: On Protonix 16. ?Strep throat: amoxicillin for 7 days,   -chloraseptic spray, MMW with lidocaine also   -improved  LOS (Days) 5 A FACE TO FACE EVALUATION WAS PERFORMED  Alger Simons T, MD 07/10/2017 11:57 AM

## 2017-07-10 NOTE — Progress Notes (Signed)
Occupational Therapy Session Note  Patient Details  Name: DONALD MEMOLI MRN: [id number] Date of Birth: [birth year]  Today's Date: 07/10/2017 OT Individual Time: 0700-0759 OT Individual Time Calculation (min): 59 min    Short Term Goals: Week 1:  OT Short Term Goal 1 (Week 1): STGs=LTGs secondary to short estimated LOS  Skilled Therapeutic Interventions/Progress Updates:    Upon entering the room, pt seated in recliner chair awaiting therapist arrival. Pt ambulates to dresser without use of AD and obtains clothing items with min verbal guidance cues. Pt ambulates into bathroom for bathing at shower level while seated on TTB with supervision and min cues for safety. Pt dressing with sit <>stand from bed toilet with supervision for UB self care and steady assistance with LB as pt is standing for most of task. Pt seated in recliner chair eating breakfast while therapist reviewed orientation cards made by family member with pt answering correctly each time. Pt remained seated in wheelchair at end of session with chair alarm activated and call bell within reach.   Therapy Documentation Precautions:  Precautions Precautions: Fall Precaution Comments: monitor HR Restrictions Weight Bearing Restrictions: No  See Function Navigator for Current Functional Status.   Therapy/Group: Individual Therapy  Gypsy Decant 07/10/2017, 11:01 AM

## 2017-07-10 NOTE — Progress Notes (Signed)
Physical Therapy Session Note  Patient Details  Name: EMANUELLE BASTOS MRN: [id number] Date of Birth: Jul 02, 1951  Today's Date: 07/10/2017 PT Individual Time: 5093-2671 PT Individual Time Calculation (min): 30 min   Short Term Goals: Week 1:  PT Short Term Goal 1 (Week 1): STG = LTG due to short ELOS.  Skilled Therapeutic Interventions/Progress Updates:    Session focused on addressing higher level cognition (memory and attention) during [employer] tasks while increasing challenge of balance as well on compliant surface as activity challenge increased. Pt instructed to read words while pressing red lights, progressed to same task while filtering out green lights (set at 50% green lights), and then added words and 2 digit numbers while filtering out red lights only (still 50% green) with pt only to read numbers. Pt demonstrated decreased reaction time as challenge progressed. No LOB occurred while on compliant surface. Pt demonstrated improvement with accuracy with repetition. Wife present to observe session. End of session assisted pt with recording session in notes section of phone program used with ST for recall.   Therapy Documentation Precautions:  Precautions Precautions: Fall Precaution Comments: monitor HR Restrictions Weight Bearing Restrictions: No   Pain:  no complaints of pain.    See Function Navigator for Current Functional Status.   Therapy/Group: Individual Therapy  Canary Brim Ivory Broad, PT, DPT  07/10/2017, 1:57 PM

## 2017-07-11 ENCOUNTER — Inpatient Hospital Stay (HOSPITAL_COMMUNITY): Admitting: Occupational Therapy

## 2017-07-11 ENCOUNTER — Inpatient Hospital Stay (HOSPITAL_COMMUNITY): Admitting: Speech Pathology

## 2017-07-11 ENCOUNTER — Inpatient Hospital Stay (HOSPITAL_COMMUNITY): Admitting: Physical Therapy

## 2017-07-11 LAB — GLUCOSE, CAPILLARY
GLUCOSE-CAPILLARY: 100 mg/dL — AB (ref 65–99)
Glucose-Capillary: 138 mg/dL — ABNORMAL HIGH (ref 65–99)
Glucose-Capillary: 137 mg/dL — ABNORMAL HIGH (ref 65–99)
Glucose-Capillary: 139 mg/dL — ABNORMAL HIGH (ref 65–99)

## 2017-07-11 LAB — MAGNESIUM: MAGNESIUM: 2.1 mg/dL (ref 1.7–2.4)

## 2017-07-11 NOTE — Plan of Care (Signed)
Problem: RH Bed to Chair Transfers Goal: LTG Patient will perform bed/chair transfers w/assist (PT) LTG: Patient will perform bed/chair transfers with assistance, with/without cues (PT).  Outcome: Completed/Met Date Met: 07/11/17 Ambulatory without AD  Problem: RH Ambulation Goal: LTG Patient will ambulate in controlled environment (PT) LTG: Patient will ambulate in a controlled environment, # of feet with assistance (PT).  Outcome: Completed/Met Date Met: 07/11/17 150 ft Goal: LTG Patient will ambulate in home environment (PT) LTG: Patient will ambulate in home environment, # of feet with assistance (PT).  Outcome: Completed/Met Date Met: 07/11/17 50 ft Goal: LTG Patient will ambulate in community environment (PT) LTG: Patient will ambulate in community environment, # of feet with assistance (PT).  Outcome: Completed/Met Date Met: 07/11/17 150 ft  Problem: RH Stairs Goal: LTG Patient will ambulate up and down stairs w/assist (PT) LTG: Patient will ambulate up and down # of stairs with assistance (PT)  Outcome: Completed/Met Date Met: 07/11/17 12 steps without rails

## 2017-07-11 NOTE — Progress Notes (Signed)
PHYSICAL MEDICINE & REHABILITATION     PROGRESS NOTE    Subjective/Complaints: Up with therapy. Feels well and memory improving  ROS: pt denies nausea, vomiting, diarrhea, cough, shortness of breath or chest pain    Objective: Vital Signs: Blood pressure (!) 164/96, pulse 69, temperature 98.6 F (37 C), temperature source Oral, resp. rate 18, height 6\' 2"  (1.88 m), weight 85.4 kg (188 lb 4.4 oz), SpO2 97 %. No results found. No results for input(s): WBC, HGB, HCT, PLT in the last 72 hours. No results for input(s): NA, K, CL, GLUCOSE, BUN, CREATININE, CALCIUM in the last 72 hours.  Invalid input(s): CO CBG (last 3)   Recent Labs  07/10/17 1642 07/10/17 2120 07/11/17 0649  GLUCAP 121* 102* 100*    Wt Readings from Last 3 Encounters:  07/05/17 85.4 kg (188 lb 4.4 oz)  07/03/17 85 kg (187 lb 4.8 oz)    Physical Exam:  Constitutional: He appears well-developed and well-nourished. NAD. Head: Normocephalic and atraumatic.  Eyes: EOMI. No discharge.  Cardiovascular: RRR without murmur. No JVD  Respiratory: CTA Bilaterally without wheezes or rales. Normal effort    GI: Bowel sounds are normal. He exhibits no distension. Musculoskeletal: He exhibits decreased scrotal edema.    Neurological: He is alert and oriented.  A&Ox3 Motor: 5/5 proximal to distal in all 4's  Oriented to room number, place. Recalled my name and other information  Skin: Skin is warm and dry.  Left chest with dry dressing over ICD placement in place Psychiatric: Normal mood and behavior, cooperative.   Assessment/Plan: 1. Functional and cognitive deficits secondary to debility/encephalopathy which require 3+ hours per day of interdisciplinary therapy in a comprehensive inpatient rehab setting. Physiatrist is providing close team supervision and 24 hour management of active medical problems listed below. Physiatrist and rehab team continue to assess barriers to discharge/monitor patient  progress toward functional and medical goals.  Function:  Bathing Bathing position   Position: Shower  Bathing parts Body parts bathed by patient: Right arm, Left arm, Chest, Abdomen, Front perineal area, Buttocks, Right upper leg, Left upper leg, Right lower leg, Left lower leg Body parts bathed by helper: Back  Bathing assist Assist Level: Supervision or verbal cues      Upper Body Dressing/Undressing Upper body dressing   What is the patient wearing?: Pull over shirt/dress     Pull over shirt/dress - Perfomed by patient: Thread/unthread right sleeve, Thread/unthread left sleeve, Put head through opening, Pull shirt over trunk          Upper body assist Assist Level: Supervision or verbal cues   Set up : To obtain clothing/put away  Lower Body Dressing/Undressing Lower body dressing   What is the patient wearing?: Pants, Underwear, Shoes, Socks Underwear - Performed by patient: Thread/unthread right underwear leg, Thread/unthread left underwear leg, Pull underwear up/down   Pants- Performed by patient: Thread/unthread right pants leg, Thread/unthread left pants leg, Pull pants up/down   Non-skid slipper socks- Performed by patient: Don/doff right sock, Don/doff left sock   Socks - Performed by patient: Don/doff right sock, Don/doff left sock   Shoes - Performed by patient: Don/doff right shoe, Don/doff left shoe, Fasten right, Fasten left            Lower body assist Assist for lower body dressing: Supervision or verbal cues      Toileting Toileting   Toileting steps completed by patient: Adjust clothing prior to toileting, Performs perineal hygiene, Adjust clothing after toileting  Toileting Assistive Devices: Quarry manager level: Supervision or verbal cues   Transfers Chair/bed transfer   Chair/bed transfer method: Ambulatory Chair/bed transfer assist level: Supervision or verbal cues       Locomotion Ambulation     Max  distance: 150 ft Assist level: Supervision or verbal cues   Wheelchair Wheelchair activity did not occur: N/A        Cognition Comprehension Comprehension assist level: Understands basic 90% of the time/cues < 10% of the time  Expression Expression assist level: Expresses basic 90% of the time/requires cueing < 10% of the time.  Social Interaction Social Interaction assist level: Interacts appropriately with others with medication or extra time (anti-anxiety, antidepressant).  Problem Solving Problem solving assist level: Solves basic 50 - 74% of the time/requires cueing 25 - 49% of the time  Memory Memory assist level: Recognizes or recalls 50 - 74% of the time/requires cueing 25 - 49% of the time   Medical Problem List and Plan: 1.  Cognitive deficits as well as deficits in mobility secondary to encephalopathy and debility.  -continue therapies  -home tomorrow 2.  DVT Prophylaxis/Anticoagulation: Pharmaceutical: Lovenox 3. Pain Management: ultram prn for pain 4. Mood: LCSW to follow for evaluation and support.  5. Neuropsych: This patient is ?capable of making decisions on his own behalf. 6. Skin/Wound Care: Monitor wound for healing.  7. Fluids/Electrolytes/Nutrition: Monitor I/Os.   Spironolactone added 8/28 8. Kleb/Staph PNA: abx completed 9. Witnessed cardiac arrest: NO driving for 6 months per [place] law--family aware.  10. HTN: Monitor BP bid--continue Cozaar and Spironolactone.   Relatively controlled on 9/3 11. [place] CM s/p ICD placement: Pacer precautions. On Cozaar and spironolactone. 12. Leucocytosis: Resolved  Monitor for signs of infection.  13. Prediabetes: Hgb A1c- 6.3--new diagnosis. RD to educate patient and family on appropriate diet.   -changed to carb mod diet  -follow up with primary    14. Encephalopathy: decreased Klonopin to 0.25 mg bid and wean as able.   -no obvious anxiety on exam today- change to prn 15. GERD: On Protonix 16. ?Strep throat:  amoxicillin. improved   -chloraseptic spray, MMW with lidocaine also   -improved    LOS (Days) 6 A FACE TO FACE EVALUATION WAS PERFORMED  Meredith Staggers, MD 07/11/2017 9:34 AM

## 2017-07-11 NOTE — Progress Notes (Signed)
Recreational Therapy Session Note  Patient Details  Name: Jeffery Thomas MRN: [id number] Date of Birth: 01/20/1951 Today's Date: 07/11/2017  TR eval deferred as therapy team states is discharging tomorrow.  [place] 07/11/2017, 9:50 AM

## 2017-07-11 NOTE — Consult Note (Signed)
Neuropsychological Consultation   Patient:   Jeffery Thomas   DOB:   June 16, 1951  MR Number:  [id number]  Location:  [facility] [id number] [place] [address] Dept: [phone] GEX: [phone]           Date of Service:   07/11/2017  Start Time:   4 PM End Time:   5 PM  Provider/Observer:  Ilean Skill, Psy.D.       Clinical Neuropsychologist       Billing Code/Service: [phone] Units  Chief Complaint:    Jeffery Thomas is a 66 year old male with admitted on 06/26/2017  With PEA cardiac arrest due to V fib/V tach and treated with CPR/ACLS protocol x20 minutes.  EEG with diffuse cerebral dysfunction with occasional focal slowing over right temporal region due to anoxic, ischemic effects.  The patient has had significant and profound memory dysfunction post cardiac arrest.  Also, some executive functioning deficits.  Patient has shown significant improvement over past week.  Patient still somewhat impulsive but significant improvements in judgement have been achieved.  He will still need to have guidance and support as he improves with regard to decision making.   Reason for Service:  Jeffery Thomas was referred for neuropsychological consultation due to cogntive changes from anoxic/hypoxic event.  Below is the HPI for the current admission.    HPI: Jeffery Thomas is a 66 y.o. male with history of HTN, GERD who was admitted on 06/26/17 with PEA cardiac arrest due to V fib/V tach and treated with CPR/ACLS protocol X 20 minutes with ROSC. History taken from chart review and family. EKG with inferior STEMI and he was treated with [place] protocol. Cardiac cath negative for CAD with LVEF <20% felt to be due to [facility] CM. CT head reviewed, negative for acute changes and CT cervical spine with multilevel degenerative changes. EEG with diffuse cerebral dysfunction with additional occasional  focal slowing over right temporal region due to anoxic, ischemic, toxic/metabolic or medication effect. Repeat echo 8/22 revealed normal LV with mild LV hypertrophy and EF 35-40% with diffuse hypokinesis. HCAP due to Kleb Pneumoniae and Staph aureus treated with IV antibiotics and transitioned to augmentin. Patient self extubated on 8/24 and has had issues with agitation due to delirium. EP consulted for input and ICD placed on 8/28. Follow up CXR without PTX . Therapy evaluations revealed cognitive deficits as well as deficits in mobility therefore CIR recommended for follow up therapy.  Current Status:  Improving cognitive functioning including executive functioning and memory.    Behavioral Observation: Jeffery Thomas  presents as a 66 y.o.-year-old Right Caucasian Male who appeared his stated age. his dress was Appropriate and he was Well Groomed and his manners were Appropriate to the situation.  his participation was indicative of Appropriate and Redirectable behaviors.  There were any physical disabilities noted due to some weakness.  he displayed an appropriate level of cooperation and motivation.     Interactions:    Active Appropriate and Redirectable  Attention:   abnormal and attention span appeared shorter than expected for age  Memory:   abnormal; remote memory intact, recent memory impaired but showing significant improvement over past two days.  Visuo-spatial:  within normal limits  Speech (Volume):  normal  Speech:   normal;   Thought Process:  Coherent and Relevant  Though Content:  WNL;   Orientation:   person, place, time/date  and situation  Judgment:   Fair  Planning:   Poor  Affect:    Appropriate  Mood:    Euthymic  Insight:   Shallow  Intelligence:   high  Medical History:   Past Medical History:  Diagnosis Date  . Hypertension     Family Med/Psych History:  Family History  Problem Relation Age of Onset  . Hypertension Mother   . Diabetes  Father   . Cancer Brother        pancreatic    Risk of Suicide/Violence: virtually non-existent   Impression/DX:  Jeffery Thomas is a 66 year old male with admitted on 06/26/2017  With PEA cardiac arrest due to V fib/V tach and treated with CPR/ACLS protocol x20 minutes.  EEG with diffuse cerebral dysfunction with occasional focal slowing over right temporal region due to anoxic, ischemic effects.  The patient has had significant and profound memory dysfunction post cardiac arrest.  Also, some executive functioning deficits.  Patient has shown significant improvement over past week.  Patient still somewhat impulsive but significant improvements in judgement have been achieved.  He will still need to have guidance and support as he improves with regard to decision making. Patient was referred for neuropsychological consultation due to cogntive changes from anoxic/hypoxic event.  Below is the HPI for the current admission.  The patient does continue to have memory issues and recall issues.  He had times with confusion of his acute event being stroke vs heart attack.  He reported that he had been told information about the event but was still having trouble remember all information that he had been given.   Disposition/Plan:  Will see the patient on outpatient basis for further neuropsychological consultation if needed.  Diagnosis:    Anoxic event post cardiac arrest        Electronically Signed   _______________________ Ilean Skill, Psy.D.

## 2017-07-11 NOTE — Progress Notes (Signed)
Speech Language Pathology Session Note & Discharge Summary  Patient Details  Name: Jeffery Thomas MRN: [id number] Date of Birth: [birth year]  Today's Date: 07/11/2017 SLP Individual Time: 0815-0900 SLP Individual Time Calculation (min): 45 min   Skilled Therapeutic Interventions:  Skilled treatment session focused on cognitive goals. SLP re-administered the MoCA (version 7.3) and scored 23/30 points with a score of 26 or above considered normal. Patient has demonstrated improvements since initial evaluation on 8/30 when he scored 14/30 points. Patient also utilized his phone to record his daily schedule/notes from previous therapy sessions with Min A verbal cues. Patient left upright in recliner with chair alarm on and quick release belt in place. Continue with current plan of care.   Patient has met 4 of 4 long term goals.  Patient to discharge at overall Mod;Min level.   Reasons goals not met: N/A   Clinical Impression/Discharge Summary: Patient has made functional gains and has met 4 of 4 LTG's this admission. Currently, patient requires overall Min-Mod A verbal cues for functional problem solving, recall, attention and awareness during familiar tasks. Patient and family education is complete and patient will discharge home with 24 hour supervision from family. Patient would benefit from f/u outpatient SLP services to maximize his cognitive function and overall functional independence in order to reduce caregiver burden.   Care Partner:  Caregiver Able to Provide Assistance: Yes  Type of Caregiver Assistance: Physical;Cognitive  Recommendation:  24 hour supervision/assistance;Outpatient SLP  Rationale for SLP Follow Up: Reduce caregiver burden;Maximize cognitive function and independence   Equipment: N/A   Reasons for discharge: Discharged from hospital   Patient/Family Agrees with Progress Made and Goals Achieved: Yes   Function:  Cognition Comprehension Comprehension assist  level: Understands basic 90% of the time/cues < 10% of the time  Expression   Expression assist level: Expresses basic 90% of the time/requires cueing < 10% of the time.  Social Interaction Social Interaction assist level: Interacts appropriately with others - No medications needed.  Problem Solving Problem solving assist level: Solves basic 75 - 89% of the time/requires cueing 10 - 24% of the time  Memory Memory assist level: Recognizes or recalls 50 - 74% of the time/requires cueing 25 - 49% of the time   [place], Jonnie Kubly 07/11/2017, 2:56 PM

## 2017-07-11 NOTE — Progress Notes (Signed)
Physical Therapy Discharge Summary  Patient Details  Name: Jeffery Thomas MRN: [id number] Date of Birth: Feb 26, 1951  Today's Date: 07/11/2017 PT Individual Time: 0905-1002 PT Individual Time Calculation (min): 57 min    Patient has met 9 of 9 long term goals due to improved activity tolerance, improved balance, improved postural control, increased strength, ability to compensate for deficits and improved coordination.  Patient to discharge at an ambulatory level Modified Independent without an AD.   Patient's care partner (wife, Jeffery Thomas) is independent to provide the necessary physical and cognitive assistance at discharge.  Reasons goals not met: n/a  Recommendation:  Patient will benefit from ongoing skilled PT services in outpatient setting to continue to advance safe functional mobility, address ongoing impairments in decreased dynamic balance, impaired cognition, and minimize fall risk.  Equipment: No equipment provided  Reasons for discharge: treatment goals met  Patient/family agrees with progress made and goals achieved: Yes  Skilled PT Treatment: Pt received in room & agreeable to tx, denying c/o pain. Pt completed all functional mobility tasks with supervision overall without AD, please see below and functional navigator for further details. Pt completed floor transfer with supervision. Pt completed Berg Balance Test & scored 92/33; educated pt on interpretation of score & current fall risk. Patient demonstrates increased fall risk as noted by score of 47/56 on Berg Balance Scale.  (<36= high risk for falls, close to 100%; 37-45 significant >80%; 46-51 moderate >50%; 52-55 lower >25%). Pt utilized [employer] Limits of Stability with assistance for weight shifting in all directions; pt easily distracted throughout task. Pt engaged in dual task of ambulating while tossing ball with therapist and recalling words from different categories with alternating letters of the alphabet. Pt  required cuing 50-75% of the time to recall next correct letter or word related to category. At end of session pt left sitting in recliner with QRB & chair alarm donned & all needs within reach.   PT Discharge Precautions/Restrictions Precautions Precautions: Fall Restrictions Weight Bearing Restrictions: No  Pain Pain Assessment Pain Assessment: No/denies pain  Vision/Perception  Pt wears glasses for reading only at baseline. Pt denies changes in baseline vision.  Cognition Overall Cognitive Status: Impaired/Different from baseline Orientation Level: Oriented X4 Memory Impairment: Decreased recall of new information;Decreased short term memory;Decreased long term memory Awareness: Impaired Awareness Impairment: Anticipatory impairment Behaviors: Impulsive Safety/Judgment: Impaired  Sensation Sensation Light Touch:  (denies numbness & tingling in BLE)  Motor  Motor Motor - Skilled Clinical Observations: general weakness   Mobility Bed Mobility Bed Mobility: Supine to Sit;Sit to Supine Supine to Sit: 6: Modified independent (Device/Increase time) Sit to Supine: 6: Modified independent (Device/Increase time) Transfers Transfers: Yes Sit to Stand: 5: Supervision  Locomotion  Ambulation Ambulation: Yes Ambulation/Gait Assistance: 5: Supervision Ambulation Distance (Feet): 150 Feet Assistive device: None Stairs / Additional Locomotion Stairs: Yes Stairs Assistance: 5: Supervision Stairs Assistance Details: Verbal cues for precautions/safety Stair Management Technique: No rails Number of Stairs: 12 Height of Stairs: 6 (inches) Ramp: 5: Supervision Curb: 5: Supervision Wheelchair Mobility Wheelchair Mobility: No   Balance Balance Balance Assessed: Yes Standardized Balance Assessment Standardized Balance Assessment: Berg Balance Test Berg Balance Test Sit to Stand: Able to stand without using hands and stabilize independently Standing Unsupported: Able to  stand 2 minutes with supervision Sitting with Back Unsupported but Feet Supported on Floor or Stool: Able to sit safely and securely 2 minutes Stand to Sit: Sits safely with minimal use of hands Transfers: Able to transfer with verbal  cueing and /or supervision Standing Unsupported with Eyes Closed: Able to stand 10 seconds with supervision Standing Ubsupported with Feet Together: Able to place feet together independently and stand for 1 minute with supervision From Standing, Reach Forward with Outstretched Arm: Can reach confidently >25 cm (10") From Standing Position, Pick up Object from Floor: Able to pick up shoe safely and easily From Standing Position, Turn to Look Behind Over each Shoulder: Looks behind from both sides and weight shifts well Turn 360 Degrees: Able to turn 360 degrees safely in 4 seconds or less Standing Unsupported, Alternately Place Feet on Step/Stool: Able to complete 4 steps without aid or supervision (completes 8 steps with supervision) Standing Unsupported, One Foot in Front: Able to place foot tandem independently and hold 30 seconds Standing on One Leg: Able to lift leg independently and hold equal to or more than 3 seconds Total Score: 47  Extremity Assessment  RLE Assessment RLE Assessment: Within Functional Limits LLE Assessment LLE Assessment: Within Functional Limits   See Function Navigator for Current Functional Status.  Jeffery Thomas 07/11/2017, 10:17 AM

## 2017-07-11 NOTE — Progress Notes (Signed)
Patient refused CPAP for tonight. RT informed patient to have RT called if he changes his mind. RT will monitor as needed. 

## 2017-07-11 NOTE — Progress Notes (Signed)
Occupational Therapy Discharge Summary and OT Intervention  Patient Details  Name: Jeffery Thomas MRN: [id number] Date of Birth: 27-Aug-1951  Today's Date: 07/11/2017 OT Individual Time: [id number] and 1530-1600 OT Individual Time Calculation (min): 72 min and 1600   Patient has met 14 of 14 long term goals due to improved activity tolerance, improved balance, ability to compensate for deficits, improved attention, improved awareness and improved coordination.  Patient to discharge at overall Supervision level.  Patient's care partner is independent to provide the necessary cognitive assistance at discharge.    Reasons goals not met: all goals met  Recommendation:  Patient will benefit from ongoing skilled OT services in outpatient setting to continue to advance functional skills in the area of iADL.  Equipment: No equipment provided  Reasons for discharge: treatment goals met  Patient/family agrees with progress made and goals achieved: Yes   OT Intervention: Session 1: Upon entering the room, pt supine in bed with no c/o pain. Pt ambulating from bed > dresser to obtain all clothing items needed for bathing and dressing task with overall supervision. Pt bathing at supervision level with sit <>stand from shower chair. Pt donning clothing items with min verbal cues for safety awareness with LB dressing. Pt seated in recliner chair to attempting filling out phone calendar of therapy appointments. Pt needing increased time and min verbal guidance cues to complete task. Pt remaining in wheelchair at end of session with quick release belt donned. Call bell and all needed items within reach upon exiiting the room.   Session 2: Upon entering the room, pt seated in recliner chair with no c/o pain. Pt ambulating to bathroom without AD and supervision for toileting. Pt navigating hospital to locate main entrance with mod verbal guidance cues and close supervision for ambulation. Pt taking seated rest  break once outside. Pt ambulating on variety of uneven surfaces with min verbal cues for safety and supervision. Pt returning to room in same manner. Chair alarm activated and quick release belt donned. Call bell within reach.   OT Discharge Precautions/Restrictions  Precautions Precautions: Fall Vital Signs Therapy Vitals Temp: 98.6 F (37 C) Temp Source: Oral Pulse Rate: 66 Resp: 18 BP: (!) 164/96 Patient Position (if appropriate): Sitting Oxygen Therapy SpO2: 97 % O2 Device: Not Delivered Pain Pain Assessment Pain Assessment: No/denies pain Vision Baseline Vision/History: Wears glasses Wears Glasses: Reading only Sensation Sensation Light Touch: Appears Intact Hot/Cold: Appears Intact Proprioception: Appears Intact Motor  Motor Motor - Skilled Clinical Observations: general weakness  Trunk/Postural Assessment  Cervical Assessment Cervical Assessment: Within Functional Limits Thoracic Assessment Thoracic Assessment: Within Functional Limits Lumbar Assessment Lumbar Assessment: Within Functional Limits Postural Control Postural Control: Deficits on evaluation  Extremity/Trunk Assessment RUE Assessment RUE Assessment: Within Functional Limits LUE Assessment LUE Assessment: Within Functional Limits   See Function Navigator for Current Functional Status.  Darleen Crocker P 07/11/2017, 8:55 AM

## 2017-07-12 LAB — GLUCOSE, CAPILLARY
GLUCOSE-CAPILLARY: 94 mg/dL (ref 65–99)
GLUCOSE-CAPILLARY: 87 mg/dL (ref 65–99)

## 2017-07-12 LAB — CBC
HEMATOCRIT: 44.5 % (ref 39.0–52.0)
HEMOGLOBIN: 14.6 g/dL (ref 13.0–17.0)
MCH: 29.1 pg (ref 26.0–34.0)
MCHC: 32.8 g/dL (ref 30.0–36.0)
MCV: 88.8 fL (ref 78.0–100.0)
Platelets: 392 10*3/uL (ref 150–400)
RBC: 5.01 MIL/uL (ref 4.22–5.81)
RDW: 13 % (ref 11.5–15.5)
WBC: 7.1 10*3/uL (ref 4.0–10.5)

## 2017-07-12 LAB — BASIC METABOLIC PANEL
ANION GAP: 8 (ref 5–15)
BUN: 13 mg/dL (ref 6–20)
CO2: 29 mmol/L (ref 22–32)
Calcium: 8.7 mg/dL — ABNORMAL LOW (ref 8.9–10.3)
Chloride: 102 mmol/L (ref 101–111)
Creatinine, Ser: 1 mg/dL (ref 0.61–1.24)
GFR calc Af Amer: >60 mL/min (ref >60)
Glucose, Bld: 101 mg/dL — ABNORMAL HIGH (ref 65–99)
Potassium: 4.4 mmol/L (ref 3.5–5.1)
SODIUM: 139 mmol/L (ref 135–145)

## 2017-07-12 LAB — MAGNESIUM: Magnesium: 2.2 mg/dL (ref 1.7–2.4)

## 2017-07-12 MED ORDER — LOSARTAN POTASSIUM 50 MG PO TABS: 50.0000 mg | ORAL_TABLET | Freq: Every day | ORAL | 0 refills | Status: DC

## 2017-07-12 MED ORDER — CLONAZEPAM 0.5 MG PO TABS: 0.2500 mg | ORAL_TABLET | Freq: Two times a day (BID) | ORAL | 0 refills | Status: DC | PRN

## 2017-07-12 MED ORDER — SPIRONOLACTONE 25 MG PO TABS: 12.5000 mg | ORAL_TABLET | Freq: Two times a day (BID) | ORAL | 0 refills | Status: DC

## 2017-07-12 MED ORDER — CARVEDILOL 3.125 MG PO TABS: 3.1250 mg | ORAL_TABLET | Freq: Two times a day (BID) | ORAL | 0 refills | Status: DC

## 2017-07-12 NOTE — Progress Notes (Signed)
Social Work Patient ID: Jeffery Thomas, male   DOB: 01-Jun-1951, 66 y.o.   MRN: [id number]   CSW met with pt and talked with wife via telephone yesterday 07-11-17 to update them on team conference discussion.  They were anticipating d/c today and feel prepared to go, after wife going through therapies with pt over the weekend.  CSW to arrange outpt rehab appointments for PT/OT/ST and PCP f/u appointment.  CSW also will give them BI support group information.  CSW remains available to assist as needed.

## 2017-07-12 NOTE — Progress Notes (Signed)
[place] PHYSICAL MEDICINE & REHABILITATION     PROGRESS NOTE    Subjective/Complaints: Up with therapy. Feels well and memory improving  ROS: pt denies nausea, vomiting, diarrhea, cough, shortness of breath or chest pain    Objective: Vital Signs: Blood pressure 134/80, pulse 65, temperature 97.9 F (36.6 C), temperature source Oral, resp. rate 18, height '6\' 2"'  (1.88 m), weight 85.4 kg (188 lb 4.4 oz), SpO2 95 %. No results found.  Recent Labs  07/12/17 0504  WBC 7.1  HGB 14.6  HCT 44.5  PLT 392    Recent Labs  07/12/17 0504  NA 139  K 4.4  CL 102  GLUCOSE 101*  BUN 13  CREATININE 1.00  CALCIUM 8.7*   CBG (last 3)   Recent Labs  07/11/17 1653 07/11/17 2148 07/12/17 0634  GLUCAP 137* 139* 94    Wt Readings from Last 3 Encounters:  07/05/17 85.4 kg (188 lb 4.4 oz)  07/03/17 85 kg (187 lb 4.8 oz)    Physical Exam:  Constitutional: He appears well-developed and well-nourished. NAD. Head: Normocephalic and atraumatic.  Eyes: EOMI. No discharge.  Cardiovascular: RRR without murmur. No JVD   Respiratory: CTA Bilaterally without wheezes or rales. Normal effort     GI: Bowel sounds are normal. He exhibits no distension. Musculoskeletal: He exhibits decreased scrotal edema.    Neurological: He is alert and oriented.  A&Ox3 Motor: 5/5 proximal to distal in all 4's  Much improved insight and awareness Skin: Skin is warm and dry.  Left chest wound clean/dry Psychiatric: Normal mood and behavior, cooperative.   Assessment/Plan: 1. Functional and cognitive deficits secondary to debility/encephalopathy which require 3+ hours per day of interdisciplinary therapy in a comprehensive inpatient rehab setting. Physiatrist is providing close team supervision and 24 hour management of active medical problems listed below. Physiatrist and rehab team continue to assess barriers to discharge/monitor patient progress toward functional and medical  goals.  Function:  Bathing Bathing position   Position: Shower  Bathing parts Body parts bathed by patient: Right arm, Left arm, Chest, Abdomen, Front perineal area, Buttocks, Right upper leg, Left upper leg, Right lower leg, Left lower leg Body parts bathed by helper: Back  Bathing assist Assist Level: Supervision or verbal cues      Upper Body Dressing/Undressing Upper body dressing   What is the patient wearing?: Pull over shirt/dress     Pull over shirt/dress - Perfomed by patient: Thread/unthread right sleeve, Thread/unthread left sleeve, Put head through opening, Pull shirt over trunk          Upper body assist Assist Level: Supervision or verbal cues   Set up : To obtain clothing/put away  Lower Body Dressing/Undressing Lower body dressing   What is the patient wearing?: Pants, Underwear, Shoes, Socks Underwear - Performed by patient: Thread/unthread right underwear leg, Thread/unthread left underwear leg, Pull underwear up/down   Pants- Performed by patient: Thread/unthread right pants leg, Thread/unthread left pants leg, Pull pants up/down   Non-skid slipper socks- Performed by patient: Don/doff right sock, Don/doff left sock   Socks - Performed by patient: Don/doff right sock, Don/doff left sock   Shoes - Performed by patient: Don/doff right shoe, Don/doff left shoe, Fasten right, Fasten left            Lower body assist Assist for lower body dressing: Supervision or verbal cues      Toileting Toileting   Toileting steps completed by patient: Adjust clothing prior to toileting, Adjust clothing after  toileting   Toileting Assistive Devices: Grab bar or rail  Toileting assist Assist level: Supervision or verbal cues   Transfers Chair/bed transfer   Chair/bed transfer method: Ambulatory Chair/bed transfer assist level: Supervision or verbal cues       Locomotion Ambulation     Max distance: >150 ft  Assist level: Supervision or verbal cues    Wheelchair Wheelchair activity did not occur: N/A        Cognition Comprehension Comprehension assist level: Understands basic 90% of the time/cues < 10% of the time  Expression Expression assist level: Expresses basic 90% of the time/requires cueing < 10% of the time.  Social Interaction Social Interaction assist level: Interacts appropriately with others - No medications needed.  Problem Solving Problem solving assist level: Solves basic 75 - 89% of the time/requires cueing 10 - 24% of the time  Memory Memory assist level: Recognizes or recalls 50 - 74% of the time/requires cueing 25 - 49% of the time   Medical Problem List and Plan: 1.  Cognitive deficits as well as deficits in mobility secondary to encephalopathy and debility.   -home today goals met. Reviewed safety considerations with patient  Patient to see Rehab MD in the office for transitional care encounter in 1-2 weeks.  2.  DVT Prophylaxis/Anticoagulation: Pharmaceutical: Lovenox--can dc 3. Pain Management: ultram prn for pain 4. Mood: LCSW to follow for evaluation and support.  5. Neuropsych: This patient is capable of making decisions on his own behalf. 6. Skin/Wound Care: Monitor wound for healing.  7. Fluids/Electrolytes/Nutrition: Monitor I/Os.   Spironolactone added 8/28 8. Kleb/Staph PNA: abx completed 9. Witnessed cardiac arrest: NO driving for 6 months per [place] law--family aware.  10. HTN: Monitor BP bid--continue Cozaar and Spironolactone.   Relatively controlled   11. Takatsubo CM s/p ICD placement: Pacer precautions. On Cozaar and spironolactone. 12. Leucocytosis: Resolved  Monitor for signs of infection.  13. Prediabetes: Hgb A1c- 6.3--new diagnosis. RD to educate patient and family on appropriate diet.   -changed to carb mod diet with improvement  -follow up with primary    14. Encephalopathy: decreased Klonopin to 0.25 mg bid and wean as able.   -no obvious anxiety on exam today- change to prn 15. GERD:  On Protonix 16. ?Strep throat: amoxicillin. Improved--can dc abx   -chloraseptic spray, MMW with lidocaine also   -improved    LOS (Days) 7 A FACE TO FACE EVALUATION WAS PERFORMED  Alger Simons T, MD 07/12/2017 9:17 AM

## 2017-07-12 NOTE — Patient Care Conference (Signed)
Inpatient RehabilitationTeam Conference and Plan of Care Update Date: 07/11/2017   Time: 2:20 PM    Patient Name: Jeffery Thomas      Medical Record Number: [id number]  Date of Birth: August 29, 1951 Sex: Male         Room/Bed: 4W16C/4W16C-01 Payor Info: Payor: TRICARE / Plan: TRICARE / Product Type: *No Product type* /    Admitting Diagnosis: anoxie,encephalopathy  Admit Date/Time:  07/05/2017  6:16 PM Admission Comments: No comment available   Primary Diagnosis:  <principal problem not specified> Principal Problem: <principal problem not specified>  Patient Active Problem List   Diagnosis Date Noted  . Benign essential HTN   . History of cardiac arrest   . Streptococcal sore throat   . 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 Cardiac device in situ   . Gastroesophageal reflux disease   . Acute systolic congestive heart failure ([place])   . Takatsuki syndrome   . Leukocytosis   . Prediabetes   . HCAP (healthcare-associated pneumonia)   . Agitation   . Acute encephalopathy   . Bradycardia   . Essential hypertension   . Abdominal distention   . Encounter for imaging study to confirm orogastric (OG) tube placement   . Acute respiratory failure with hypoxia ([place])   . Encounter for central line placement   . Ventricular fibrillation ([place]) 07-16-2017  . Sudden cardiac death ([place]) 07/16/17  . ST elevation myocardial infarction (STEMI) [facility])     Expected Discharge Date: Expected Discharge Date: 07/12/17  Team Members Present:       Current Status/Progress Goal Weekly Team Focus  Medical   anoxic encephalopathy displaying nice improvements. bp better controlled. strept throat  improve memory/concentration  bp control, nutrition, abx for throat   Bowel/Bladder   cont of B/b; lbm 9/3  maintain  assess for changes q shift and prn   Swallow/Nutrition/ Hydration             ADL's   supervision overall with S - steady assist for LB self care with balance  supervision  overall  pt/family education, safety awareness, cognition, d/c planning, endurance   Mobility   supervision overall  supervision overall  pt/family education, balance, NMR, cognitive remediation, safety awareness, endurance, strengthening   Communication             Safety/Cognition/ Behavioral Observations  Min-Mod A  Min-Mod A  D/C home tomorrow    Pain   denies  <2  assess for pain q shift and prn   Skin   L elbow skin tear, tegaderm; ICD L upper chest  free of breakdown  assess q shift and rpn    Rehab Goals Patient on target to meet rehab goals: Yes Rehab Goals Revised: none *See Care Plan and progress notes for long and short-term goals.     Barriers to Discharge  Current Status/Progress Possible Resolutions Date Resolved   Physician    Medical stability        treatment of medical conditions per chart      Nursing                  PT  Behavior  impaired cognition - pt will require 24 hr supervision              OT                  SLP                SW  Discharge Planning/Teaching Needs:  Pt to return home with his wife and brother to provide 24/7 supervision.  Pt's wife was able to go through therapies with pt and received family education over the weekend.   Team Discussion:  Pt with anoxic injury following cardiac arrest, but Dr. Naaman Plummer feels he is progressing well and making cognitive gains.  Pt's wife received family education with all three disciplines.  Therapists explained to the wife that pt cannot be reasoned with right now and that he should not make decisions independently.  Pt has improved over the week on the MoCA +9 points. Pt remains impulsive and gets up on his own.  Pt with supervision level goals.  Revisions to Treatment Plan:  None    Continued Need for Acute Rehabilitation Level of Care: The patient requires daily medical management by a physician with specialized training in physical medicine and rehabilitation for the  following conditions: Daily direction of a multidisciplinary physical rehabilitation program to ensure safe treatment while eliciting the highest outcome that is of practical value to the patient.: Yes Daily medical management of patient stability for increased activity during participation in an intensive rehabilitation regime.: Yes Daily analysis of laboratory values and/or radiology reports with any subsequent need for medication adjustment of medical intervention for : Cardiac problems;Neurological problems  Sangita Zani, Silvestre Mesi 07/12/2017, 11:59 AM

## 2017-07-12 NOTE — Discharge Summary (Signed)
Physician Discharge Summary  Patient ID: HALVOR BEHREND MRN: [id number] DOB/AGE: 1951-08-15 66 y.o.  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 Defibrillator is now seen. No pneumothorax is noted. Patchy changes are seen in the left lung base relatively stable from the prior exam. Left jugular central line has been removed in the interval. IMPRESSION: No pneumothorax following defibrillator placement Electronically Signed   By: Inez Catalina M.D.   On: 07/05/2017 07:45     Labs:  Basic Metabolic Panel:  Recent Labs Lab 07/06/17 0557 07/07/17 0508 07/08/17 0615 07/09/17 0531 07/10/17 0427 07/11/17 1044 07/12/17 0504  NA 139  --   --   --   --   --  139  K 4.4  --   --   --   --   --  4.4  CL 103  --   --   --   --   --  102  CO2 26  --   --   --   --   --  29  GLUCOSE 153*  --   --   --   --   --  101*  BUN 18  --   --   --   --   --  13  CREATININE 1.02  --   --   --   --   --  1.00  CALCIUM 8.7*  --   --   --   --   --  8.7*  MG 2.1 2.1 2.3 2.2 2.1 2.1 2.2    CBC:  Recent Labs Lab 07/06/17 0557 07/12/17 0504  WBC 10.0 7.1  NEUTROABS 8.0*  --   HGB 14.6 14.6  HCT 46.3 44.5  MCV 89.0 88.8  PLT 291 392    CBG:  Recent Labs Lab 07/11/17 1207 07/11/17 1653 07/11/17 2148 07/12/17 0634 07/12/17 1153  GLUCAP 138* 137* 139* 94 87    Brief HPI:   JOSHU FURUKAWA is a 66 y.o. male with history of HTN, GERD who was admitted on 06/26/17 with PEA cardiac arrest due to V fib/V  tach and treated with CPR/ACLS protocol X 20 minutes with ROSC.  He was found to have inferior STEMI and treated with [facility] protocol. Cardiac cath negative for CAD with LVEF <20% felt to be due to [facility] CM. CT head was negative for acute changes and EEG with diffuse cerebral dysfunction felt to be due to anoxic, ischemic, toxic/metabolic or medication effect. Repeat echo 8/22 revealed normal LV with mild LV hypertrophy and EF 35-40% with diffuse hypokinesis. He was started on IV antibiotics for Kleb Pneumoniae/Staph aureus HCAP and ICD placed on 8/28. Follow up CXR without PTX . Therapy evaluations revealed cognitive deficits as well as deficits in mobility therefore CIR recommended for follow up therapy.   Hospital Course: TAMIKA NOU was admitted to rehab 07/05/2017 for inpatient therapies to consist of PT, ST and OT at least three hours five days a week. Past admission physiatrist, therapy team and rehab RN have worked together to provide customized  collaborative inpatient rehab. He completed his antibiotic course on 8/30 but reported issue with sore throat. Exam revealed white plaques and amoxicillin was added X 5 days with oral care due to concerns of strep throat. Follow up labs showed H/H and renal status is stable. Blood pressures were monitored on bid basis and have been controlled.  He has had improvement in activity tolerance and no cardiac or respiratory symptoms reported with increase in activity. He was educated on carb modified diet due to new diagnosis of prediabetes. Pacer incision is clean,dry and  intact with steri strips in place. Mood is stable and he has made good progress during his rehab stay. He has progressed to supervision level and requires min to mod assist cognitively. He will continue to receive follow up outpatient PT,OT and ST at [facility] after discharge.    Rehab course: During patient's stay in rehab weekly team conferences were held to monitor  patient's progress, set goals and discuss barriers to discharge. At admission, patient required min assist with mobility and basic self care tasks. He exhibited deficits in executive functioning, attention, recall, problem solving and safety with MoCA score 14/30. He has had improvement in activity tolerance, balance, postural control, as well as ability to compensate for deficits. He is able to complete ADL task with supervision. His cognition has improved with MoCA score up to 23/30 at discharge. He requires min verbal cues for recall and min to mod assist for functional problem solving, attention and awareness during familiar tasks. Family education was completed regarding all aspects of care and safety.     Disposition: 01-Home or Self Care  Diet:  Heart healthy/ Carb Modified.   Special Instructions: 1. No driving for 6 months 2.No lifting, golfing or strenuous activity till cleared by MD. 3. Family needs to assist with medication management.   Discharge Instructions    Ambulatory referral to Physical Medicine Rehab    Complete by:  As directed    1-2 weeks transitional care appt     Allergies as of 07/12/2017   No Known Allergies     Medication List    STOP taking these medications   amoxicillin-clavulanate 875-125 MG tablet Commonly known as:  AUGMENTIN   atorvastatin 40 MG tablet Commonly known as:  LIPITOR   hydrOXYzine 25 MG tablet Commonly known as:  ATARAX/VISTARIL   loperamide 2 MG capsule Commonly known as:  IMODIUM     TAKE these medications   carvedilol 3.125 MG tablet Commonly known as:  COREG Take 1 tablet (3.125 mg total) by mouth 2 (two) times daily with a meal.   clonazePAM 0.5 MG tablet Commonly known as:  KLONOPIN Take 0.5 tablets (0.25 mg total) by mouth 2 (two) times daily as needed (anxiety).   levocetirizine 5 MG tablet Commonly known as:  XYZAL Take 5 mg by mouth every morning.   losartan 50 MG tablet Commonly known as:  COZAAR Take 1  tablet (50 mg total) by mouth daily. What changed:  how much to take   montelukast 10 MG tablet Commonly known as:  SINGULAIR Take 10 mg by mouth every morning.   omeprazole 20 MG capsule Commonly known as:  PRILOSEC Take 40 mg by mouth daily.   ranitidine 150 MG tablet Commonly known as:  ZANTAC Take 150 mg by mouth 2 (two) times daily.   spironolactone 25 MG tablet Commonly known as:  ALDACTONE Take 0.5 tablets (12.5 mg total) by mouth 2 (two) times daily.  Follow-up Information    Bernerd Limbo, MD. Go on 07/21/2017.   Specialty:  Family Medicine Why:  @ 3:20 PM Contact information: [address] [place] [address] [phone]        Meredith Staggers, MD Follow up.   Specialty:  Physical Medicine and Rehabilitation Why:  Office will call you with follow up appointment Contact information: [place] [address] [phone]        [facility] Follow up on 07/14/2017.   Specialty:  Cardiology Why:  has follow up appointment at 11 am Contact information: [address], [address] [place]       Lorretta Harp, MD. Call in 1 day(s).   Specialties:  Cardiology, Radiology Why:  for follow up appointment Contact information: [address] [place] [address] [phone]           Signed: Bary Leriche 07/12/2017, 5:05 PM

## 2017-07-12 NOTE — Progress Notes (Signed)
Social Work Discharge Note  The overall goal for the admission was met for:   Discharge location: Yes - home with wife and brother to provide 24/7 supervision  Length of Stay: Yes - 7 days  Discharge activity level: Yes - supervision  Home/community participation: Yes  Services provided included: MD, RD, PT, OT, SLP, RN, TR, Pharmacy, Neuropsych and SW  Financial Services: Other: Veterinary surgeon  Follow-up services arranged: Outpatient: PT/OT/ST - [place] and Patient/Family has no preference for HH/DME agencies  No DME recommended  Comments (or additional information): Pt to have 24/7 supervision from his wife and/or brother.  Pt to have outpt therapy.  Therapists made suggestions for pt/wife for exercise until pt goes for outpt therapy.    Patient/Family verbalized understanding of follow-up arrangements: Yes  Individual responsible for coordination of the follow-up plan: Pt's wife  Confirmed correct DME delivered: Trey Sailors 07/12/2017    Udell Mazzocco, Silvestre Mesi

## 2017-07-12 NOTE — Discharge Instructions (Signed)
° °  Inpatient Rehab Discharge Instructions  Jeffery Thomas Discharge date and time: 07/12/17   Activities/Precautions/ Functional Status: Activity: no lifting, driving, or strenuous exercise till cleared by MD.  Diet: cardiac diet and diabetic diet Wound Care: keep wound clean and dry   Functional status:  ___ No restrictions     ___ Walk up steps independently _X__ 24/7 supervision/assistance   ___ Walk up steps with assistance ___ Intermittent supervision/assistance  ___ Bathe/dress independently ___ Walk with walker     _X__ Bathe/dress with supervision ___ Walk Independently    ___ Shower independently ___ Walk with assistance    ___ Shower with assistance _X__ No alcohol     ___ Return to work/school ________   Special Instructions: 1. No driving for 6 months or as advised by Cardiology. 2. Family needs to assist with medication management.   Outpatient: PT     OT     ST  Agency:  [facility]                            [address] [place]                            [place], [place]  [address]  Phone:    [phone]             Appointment Date/Time:  Monday, 07-17-17 at 8 AM (speech therapy) and 8:45 AM (physical therapy) - arrive at 7:30 AM                                                      Tuesday, 07-18-17 at 9:30 AM (occupational therapy) Medical Equipment/Items Ordered:  You do not need any medical equipment.    GENERAL COMMUNITY RESOURCES FOR PATIENT/FAMILY: Support Groups:  [place] [facility] Support [facility] (for survivors and their loved ones)                                 2nd Tuesday of every month  7-8:30 PM                                 [facility], [address], [place]                                 For more information, all Lennart Pall at [phone]  My questions have been answered and I understand these instructions. I will adhere to these goals and the provided educational materials after my  discharge from the hospital.  Patient/Caregiver Signature _______________________________ Date __________  Clinician Signature _______________________________________ Date __________  Please bring this form and your medication list with you to all your follow-up doctor's appointments. COMMUNITY

## 2017-07-14 ENCOUNTER — Telehealth: Payer: Self-pay | Admitting: *Deleted

## 2017-07-14 ENCOUNTER — Ambulatory Visit (INDEPENDENT_AMBULATORY_CARE_PROVIDER_SITE_OTHER): Admitting: *Deleted

## 2017-07-14 DIAGNOSIS — I4901 Ventricular fibrillation: Secondary | ICD-10-CM | POA: Diagnosis not present

## 2017-07-14 DIAGNOSIS — Z95810 Presence of automatic (implantable) cardiac defibrillator: Secondary | ICD-10-CM

## 2017-07-14 LAB — CUP PACEART INCLINIC DEVICE CHECK
HIGH POWER IMPEDANCE MEASURED VALUE: 83.25 Ohm
Lead Channel Impedance Value: 475 Ohm
Lead Channel Pacing Threshold Pulse Width: 0.5 ms
Lead Channel Sensing Intrinsic Amplitude: 11.8 mV
Lead Channel Setting Sensing Sensitivity: 0.5 mV
MDC IDC LEAD IMPLANT DT: 20180828
MDC IDC LEAD LOCATION: 753860
MDC IDC MSMT BATTERY REMAINING LONGEVITY: 106 mo
MDC IDC MSMT LEADCHNL RV PACING THRESHOLD AMPLITUDE: 0.75 V
MDC IDC PG IMPLANT DT: 20180828
MDC IDC PG SERIAL: 7423068
MDC IDC SESS DTM: 20180907150517
MDC IDC SET LEADCHNL RV PACING AMPLITUDE: 3.5 V
MDC IDC SET LEADCHNL RV PACING PULSEWIDTH: 0.5 ms
MDC IDC STAT BRADY RV PERCENT PACED: 0 %

## 2017-07-14 NOTE — Progress Notes (Signed)
Wound check appointment. Steri-strips removed. Wound without redness or edema. Incision edges approximated, wound well healed. Normal device function. Thresholds, sensing, and impedances consistent with implant measurements. Device programmed at 3.5V for extra safety margin until 3 month visit. Histogram distribution appropriate for patient and level of activity. No ventricular arrhythmias noted. Patient educated about wound care, arm mobility, lifting restrictions, shock plan. ROV with JA on 10/09/17.

## 2017-07-14 NOTE — Telephone Encounter (Signed)
Spoke with patient's wife, advised that after speaking with Dr. Rayann Heman, safe for patient to putt 2 weeks post-implant but no chipping/golf games until 6 weeks post-implant.  Patient's wife verbalizes understanding and states she will make the patient aware.

## 2017-07-17 ENCOUNTER — Encounter: Payer: Self-pay | Admitting: Rehabilitation

## 2017-07-17 ENCOUNTER — Ambulatory Visit

## 2017-07-17 ENCOUNTER — Ambulatory Visit: Attending: Physical Medicine & Rehabilitation | Admitting: Rehabilitation

## 2017-07-17 DIAGNOSIS — R41841 Cognitive communication deficit: Secondary | ICD-10-CM | POA: Diagnosis present

## 2017-07-17 DIAGNOSIS — M6281 Muscle weakness (generalized): Secondary | ICD-10-CM | POA: Insufficient documentation

## 2017-07-17 DIAGNOSIS — R41844 Frontal lobe and executive function deficit: Secondary | ICD-10-CM | POA: Diagnosis present

## 2017-07-17 DIAGNOSIS — R2681 Unsteadiness on feet: Secondary | ICD-10-CM | POA: Diagnosis not present

## 2017-07-17 NOTE — Therapy (Signed)
[facility] [address] [place], [place], [address] Phone: [phone]   Fax:  [phone]  Speech Language Pathology Evaluation  Patient Details  Name: Jeffery Thomas MRN: [id number] Date of Birth: 10-25-1951 Referring Provider: Oval Linsey, MD  Encounter Date: 07/17/2017      End of Session - 07/17/17 1729    Visit Number 1   Number of Visits 17   Date for SLP Re-Evaluation 09/22/17   SLP Start Time 0805   SLP Stop Time  0847   SLP Time Calculation (min) 42 min   Activity Tolerance Patient tolerated treatment well      Past Medical History:  Diagnosis Date  . Hypertension     Past Surgical History:  Procedure Laterality Date  . ICD IMPLANT N/A 07/04/2017   Procedure: ICD Implant;  Surgeon: Thompson Grayer, MD;  Location: [facility];  Service: Cardiovascular;  Laterality: N/A;  . LEFT HEART CATH AND CORONARY ANGIOGRAPHY N/A 06/26/2017   Procedure: LEFT HEART CATH AND CORONARY ANGIOGRAPHY;  Surgeon: Lorretta Harp, MD;  Location: [facility];  Service: Cardiovascular;  Laterality: N/A;    There were no vitals filed for this visit.      Subjective Assessment - 07/17/17 0837    Subjective "I had a little bit of a memory lapse but its starting to come back." "It takes me a while to get going, it doesn't quite click (like it used to)."   Currently in Pain? No/denies            SLP Evaluation [facility] - 07/17/17 0813      SLP Visit Information   SLP Received On 07/17/17   Referring Provider Oval Linsey, MD   Onset Date 06-26-17   Medical Diagnosis Anoxic-ischemic encephalopathy     General Information   HPI 66 year old male presented to ED 8/20 after cardiac arrest (ACLS about 20 minutes) found to have inferior STEMI. Emergent Cath performed with normal coronary arteries and severely reduced EF of 25%. ETT 8/20, self-extubated 8/24. Pt with acute encephalopathy; concerns for anoxia.  Pt  underwent ST on CIR and was d/c'd home on 07-12-17, per pt (correct). He attends OP therapies this morning as his next course of tx.     Prior Functional Status   Cognitive/Linguistic Baseline Within functional limits   Vocation Retired     Charity fundraiser Status Impaired/Different from baseline   Area of Impairment Memory   Memory Decreased short-term memory   Memory Comments will have to perform [employer] Learning Test ASAP to assess whether encoding vs. recall deficit.   Memory Impaired   Awareness Appears intact  Wife keeping meds;does not want to try taking over&upset her   Behaviors Impulsive  mildly, during testing     Auditory Comprehension   Overall Auditory Comprehension Appears within functional limits for tasks assessed     Verbal Expression   Overall Verbal Expression Appears within functional limits for tasks assessed     Oral Motor/Sensory Function   Overall Oral Motor/Sensory Function Appears within functional limits for tasks assessed     Motor Speech   Overall Motor Speech Appears within functional limits for tasks assessed     Standardized Assessments   Standardized Assessments  Cognitive Linguistic Quick Test     Cognitive Linguistic Quick Test (Ages 18-69)   Attention WNL   Memory Moderate   Executive Function WNL   Language WNL   Visuospatial  Skills WNL   Severity Rating Total 18   Composite Severity Rating 14.8                         SLP Education - 07/17/17 1728    Education provided Yes   Education Details eval results, likely ST work with pt's memory   Person(s) Educated Patient   Methods Explanation   Comprehension Verbalized understanding          SLP Short Term Goals - 07/17/17 1734      SLP SHORT TERM GOAL #1   Title pt will tell SLP 4 memory strategies with modified independence   Time 4   Period Weeks   Status New     SLP SHORT TERM GOAL #2   Title pt will use at least one memory strategy  in 4 sessions    Time 4   Period Weeks   Status New     SLP SHORT TERM GOAL #3   Title pt will report <50% assistance from wife for med manangement   Time 4   Period Weeks   Status New     SLP SHORT TERM GOAL #4   Title pt will functionally use a reminder system for appointments with rare min A   Time 4   Period Weeks   Status New          SLP Long Term Goals - 07/17/17 1736      SLP LONG TERM GOAL #1   Title pt will functionally use a reminder system for appointments over two weeks   Time 8   Period Weeks   Status New     SLP LONG TERM GOAL #2   Title pt will use at least one memory strategy in 8 (total) therapy sessions   Time 8   Period Weeks   Status New     SLP LONG TERM GOAL #3   Title pt will manage meds with modified independence over two weeks   Time 8   Period Weeks   Status New     SLP LONG TERM GOAL #4   Title pt will use a memory compensation system of his choosing successfully without cues for two weeks   Time 8   Period Weeks   Status New          Plan - 07/17/17 1729    Clinical Impression Statement Pt presents with memory deficits caused by suspected anoxia following STEMI. Pt's awareness and attention appear WNL from eval tasks today, measured by the Cognitive Linguistic Quick Test (CLQT) Composite severity rating of 3.6 (WNL). However pt's wife is managing meds and appointment schedule. Exact nature of pt's memory disorder will be ascertained ASAP with [employer] Verbal Learning Test. Pt would beneift from skilled ST for increasing independence.    Speech Therapy Frequency 2x / week   Duration --  8 weeks/17 sessions    Treatment/Interventions Compensatory strategies;Functional tasks;Cueing hierarchy;Patient/family education;Cognitive reorganization;Internal/external aids;SLP instruction and feedback   Potential to Achieve Goals Good      Patient will benefit from skilled therapeutic intervention in order to improve the following deficits  and impairments:   Cognitive communication deficit    Problem List Patient Active Problem List   Diagnosis Date Noted  . Benign essential HTN   . History of cardiac arrest   . Streptococcal sore throat   . 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 Cardiac device in situ   . Gastroesophageal reflux  disease   . Acute systolic congestive heart failure ([place])   . Takatsuki syndrome   . Leukocytosis   . Prediabetes   . HCAP (healthcare-associated pneumonia)   . Agitation   . Acute encephalopathy   . Bradycardia   . Essential hypertension   . Abdominal distention   . Encounter for imaging study to confirm orogastric (OG) tube placement   . Acute respiratory failure with hypoxia ([place])   . Encounter for central line placement   . Ventricular fibrillation ([place]) 2017/07/17  . Sudden cardiac death ([place]) 07-17-2017  . ST elevation myocardial infarction (STEMI) ([place])     [facility] ,[place], [place]  07/17/2017, 5:39 PM  [facility] [address] [place], [place], [address] Phone: [phone]   Fax:  [phone]  Name: Jeffery Thomas MRN: [id number] Date of Birth: September 26, 1951

## 2017-07-17 NOTE — Patient Instructions (Signed)
Do these in the corner and have a chair in front of you for support if you need it.      Feet Together (Compliant Surface) Varied Arm Positions - Eyes Closed    Stand on compliant surface: __pillow or cushion______ with feet together and arms out. Close eyes and visualize upright position. Hold__20__ seconds. Repeat __3__ times per session. Do _1-2___ sessions per day.  Copyright  VHI. All rights reserved.    Feet Together (Compliant Surface) Head Motion - Eyes Closed    Stand on compliant surface: ___pillow or cushion_____ with feet together. Close eyes and move head slowly, up and down x 10 reps, side to side x 10 reps.  Make sure that you start with slow head turns but make them continuous and do not stop.   Repeat _1___ times per session. Do __1-2__ sessions per day.  Copyright  VHI. All rights reserved.

## 2017-07-17 NOTE — Therapy (Signed)
[facility] [address] [place], [place], [address] Phone: [phone]   Fax:  [phone]  Physical Therapy Evaluation  Patient Details  Name: Jeffery Thomas MRN: [id number] Date of Birth: 05/15/1951 Referring Provider: Alger Simons, MD  Encounter Date: 07/17/2017      PT End of Session - 07/17/17 0929    Visit Number 1  one time eval only   Number of Visits 1   Authorization Type [employer]   PT Start Time 0848   PT Stop Time 0927   PT Time Calculation (min) 39 min   Activity Tolerance Patient tolerated treatment well   Behavior During Therapy [facility] for tasks assessed/performed      Past Medical History:  Diagnosis Date  . Hypertension     Past Surgical History:  Procedure Laterality Date  . ICD IMPLANT N/A 07/04/2017   Procedure: ICD Implant;  Surgeon: Thompson Grayer, MD;  Location: [facility];  Service: Cardiovascular;  Laterality: N/A;  . LEFT HEART CATH AND CORONARY ANGIOGRAPHY N/A 06/26/2017   Procedure: LEFT HEART CATH AND CORONARY ANGIOGRAPHY;  Surgeon: Lorretta Harp, MD;  Location: [facility];  Service: Cardiovascular;  Laterality: N/A;    There were no vitals filed for this visit.       Subjective Assessment - 07/17/17 0852    Subjective "I can still do stairs, I can walk the dog a few blocks, I can walk in grocery store and carry bags, cook."  "I really want to return to golfing."    Patient Stated Goals "I get tired really easy, so I want to get my strength and stamina back."    Currently in Pain? No/denies            [facility] PT Assessment - 07/17/17 0854      Assessment   Medical Diagnosis anoxic ischemic encephalopathy   Referring Provider Alger Simons, MD   Onset Date/Surgical Date 06/26/17   Prior Therapy acute, IP rehab     Precautions   Precautions ICD/Pacemaker   Precaution Comments no lifting (does not have weight), no pushing, no over exertion      Balance Screen   Has the patient fallen in the past 6 months No   Has the patient had a decrease in activity level because of a fear of falling?  No   Is the patient reluctant to leave their home because of a fear of falling?  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 None     Prior Function   Level of Independence Independent   Vocation Retired   Air traffic controller, run, going to gym (machines)      Cognition   Overall Cognitive Status Within Functional Limits for tasks assessed     Sensation   Light Touch Appears Intact   Hot/Cold Appears Intact   Proprioception Appears Intact     Coordination   Gross Motor Movements are Fluid and Coordinated Yes   Fine Motor Movements are Fluid and Coordinated Yes   Heel Shin Test some difficulty in LLE due to old L TKA     ROM /  Strength   AROM / PROM / Strength Strength     Strength   Overall Strength Within functional limits for tasks performed     Transfers   Transfers Sit to Stand;Stand to Sit   Sit to Stand 7: Independent   Stand to Sit 7: Independent     Ambulation/Gait   Ambulation/Gait Yes   Ambulation/Gait Assistance 7: Independent   Ambulation Distance (Feet) 800 Feet   Assistive device None   Gait Pattern Within Functional Limits  somewhat antalgic due to L TKA   Ambulation Surface Level;Unlevel;Indoor;Outdoor;Paved;Gravel;Grass   Gait velocity 4.00 ft/sec without AD   Stairs Yes   Stairs Assistance 6: Modified independent (Device/Increase time)   Stair Management Technique No rails;Alternating pattern;Forwards  mild difficulty descending due to L TKA   Number of Stairs 4   Height of Stairs 6     Functional Gait   Assessment   Gait assessed  Yes   Gait Level Surface Walks 20 ft in less than 5.5 sec, no assistive devices, good speed, no evidence for imbalance, normal gait pattern, deviates no more than 6 in outside of the 12 in walkway width.   Change in Gait Speed Able to smoothly change walking speed without loss of balance or gait deviation. Deviate no more than 6 in outside of the 12 in walkway width.   Gait with Horizontal Head Turns Performs head turns smoothly with no change in gait. Deviates no more than 6 in outside 12 in walkway width   Gait with Vertical Head Turns Performs head turns with no change in gait. Deviates no more than 6 in outside 12 in walkway width.   Gait and Pivot Turn Pivot turns safely within 3 sec and stops quickly with no loss of balance.   Step Over Obstacle Is able to step over 2 stacked shoe boxes taped together (9 in total height) without changing gait speed. No evidence of imbalance.   Gait with Narrow Base of Support Is able to ambulate for 10 steps heel to toe with no staggering.   Gait with Eyes Closed Walks 20 ft, no assistive devices, good speed, no evidence of imbalance, normal gait pattern, deviates no more than 6 in outside 12 in walkway width. Ambulates 20 ft in less than 7 sec.   Ambulating Backwards Walks 20 ft, no assistive devices, good speed, no evidence for imbalance, normal gait   Steps Alternating feet, no rail.   Total Score 30   FGA comment: Max score 30            Objective measurements completed on examination: See above findings.                  PT Education - 07/17/17 [phone]    Education provided Yes   Education Details evaluation findings, very high level vestibular/balance deficits with two exercises to address this at home   Person(s) Educated Patient   Methods Explanation;Demonstration;Handout   Comprehension Verbalized understanding;Returned demonstration                     Plan - 07/17/17 0929     Clinical Impression Statement Pt presents s/p cardiac arrest (NSTEMI) on 8/20 with diagnosed Taktsuki Syndrome.  Note anoxic ischemic encephalopathy following MI and was then transferred to IP rehab from 8/29-9/5.  History of L TKA, HTN, HLD and recent ICD placement with some precautions following this for 6 weeks (unable to return to golf until then).  Upon PT evaluation,  note gait speed is 4.0 ft/sec and FGA score is 30/30.  Pt did have mild difficulty with vestibular corner balance challenge, therefore provided these for home and feel that if he does these for a few weeks, this system will demonstrate improvement.  Pt verbalized understanding.  Due to pts high level, PT did not pick pt up as he is independent at home and is beginning to do more at home, is cooking and being as active as he can be with ICD precautions.     Clinical Presentation Stable   Clinical Decision Making Low   PT Frequency One time visit   PT Home Exercise Plan see pt instruction    Consulted and Agree with Plan of Care Patient      Patient will benefit from skilled therapeutic intervention in order to improve the following deficits and impairments:     Visit Diagnosis: Unsteadiness on feet - Plan: PT plan of care cert/re-cert     Problem List Patient Active Problem List   Diagnosis Date Noted  . Benign essential HTN   . History of cardiac arrest   . Streptococcal sore throat   . 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 Cardiac device in situ   . Gastroesophageal reflux disease   . Acute systolic congestive heart failure ([place])   . Takatsuki syndrome   . Leukocytosis   . Prediabetes   . HCAP (healthcare-associated pneumonia)   . Agitation   . Acute encephalopathy   . Bradycardia   . Essential hypertension   . Abdominal distention   . Encounter for imaging study to confirm orogastric (OG) tube placement   . Acute respiratory failure with hypoxia ([place])   . Encounter for central line placement    . Ventricular fibrillation ([place]) 29-Jun-2017  . Sudden cardiac death ([place]) 2017/06/29  . ST elevation myocardial infarction (STEMI) ([place])     Cameron Sprang, PT, MPT [facility] [address] [place], [place], [address] Phone: [phone]   Fax:  [phone] 07/17/17, 1:02 PM  Name: HASKELL RIHN MRN: [id number] Date of Birth: 1951-06-10

## 2017-07-18 ENCOUNTER — Ambulatory Visit: Admitting: Occupational Therapy

## 2017-07-18 ENCOUNTER — Telehealth: Payer: Self-pay | Admitting: Physical Medicine & Rehabilitation

## 2017-07-18 DIAGNOSIS — R2681 Unsteadiness on feet: Secondary | ICD-10-CM | POA: Diagnosis not present

## 2017-07-18 DIAGNOSIS — M6281 Muscle weakness (generalized): Secondary | ICD-10-CM

## 2017-07-18 DIAGNOSIS — R41844 Frontal lobe and executive function deficit: Secondary | ICD-10-CM

## 2017-07-18 NOTE — Telephone Encounter (Signed)
He may be left alone to do ADL's if she feels that he's doing that well. He was improving quite a bit before he left rehab

## 2017-07-18 NOTE — Telephone Encounter (Signed)
Wife [name] called office and states they need to know when he would be able to be released to do ADL alone - PT told them they need to contact you - he seems to be performing remarkably  [phone]  She has removed all keys in house so he knows he cannot drive - but unsure because of his remarkable progress if he can do ADL alone

## 2017-07-18 NOTE — Therapy (Signed)
[facility] [address] [place], [place], [address] Phone: [phone]   Fax:  [phone]  Occupational Therapy Evaluation  Patient Details  Name: Jeffery Thomas MRN: [id number] Date of Birth: 06/02/1951 Referring Provider: Dr. Donetta Potts  Encounter Date: 07/18/2017      OT End of Session - 07/18/17 1206    Visit Number 1   Number of Visits 8   Date for OT Re-Evaluation 08/17/17   Authorization Type TRIC[employer]    Authorization - Visit Number 1   Authorization - Number of Visits 10   OT Start Time 0930   OT Stop Time 1015   OT Time Calculation (min) 45 min   Activity Tolerance Patient tolerated treatment well      Past Medical History:  Diagnosis Date  . Hypertension     Past Surgical History:  Procedure Laterality Date  . ICD IMPLANT N/A 07/04/2017   Procedure: ICD Implant;  Surgeon: Thompson Grayer, MD;  Location: [facility];  Service: Cardiovascular;  Laterality: N/A;  . LEFT HEART CATH AND CORONARY ANGIOGRAPHY N/A 06/26/2017   Procedure: LEFT HEART CATH AND CORONARY ANGIOGRAPHY;  Surgeon: Lorretta Harp, MD;  Location: [facility];  Service: Cardiovascular;  Laterality: N/A;    There were no vitals filed for this visit.      Subjective Assessment - 07/18/17 0934    Pertinent History 06/26/17: STEMI (Myocardial infarction) w/ subsequent anoxic-ischemic encephalopathy BI   Limitations **Defibrillator, no driving for 6 months; no lifting, golfing, or strenous activity til cleared by MD; family to assist with medication management   Patient Stated Goals To do it and get out of it, and get some of my memory back   Currently in Pain? No/denies           [facility] OT Assessment - 07/18/17 0001      Assessment   Diagnosis TBI  STEMI 06/26/17 with subsequent anoxic-ischemic encephalopathy   Referring Provider Dr. Donetta Potts   Onset Date 06/26/17   Prior Therapy 07/05/17 - 07/12/17     Precautions   Precautions --  debribrillator   Precaution Comments no lifting, no golfing, or strenous activity until cleared by MD; NO driving for 6 months     Restrictions   Weight Bearing Restrictions No     Balance Screen   Has the patient fallen in the past 6 months No   Has the patient had a decrease in activity level because of a fear of falling?  No   Is the patient reluctant to leave their home because of a fear of falling?  No     Home  Environment   Bathroom Shower/Tub Walk-in Shower;Curtain   Additional Comments Pt lives in 1 story ranch with basement. 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      Mobility   Mobility Status Independent     Written Expression   Dominant Hand Right   Handwriting --  denies change     Vision - History   Baseline Vision Wears glasses only for reading   Visual History Cataracts  surgery to remove Lt cataract   Additional Comments denies change     Activity  Tolerance   Activity Tolerance --  fatigues quicker     Cognition   Memory Impaired     Observation/Other Assessments   Observations Subtracting by 7's with 1 error and noted working memory impaired. Pt forgot what number he was subtracting by. Spells "world" backwards and forwards w/o difficulty. Immediate recall 3/3, delayed recall: 1/3     Sensation   Additional Comments denies change     Coordination   9 Hole Peg Test Right;Left   Right 9 Hole Peg Test 21 sec   Left 9 Hole Peg Test 21 sec.      Edema   Edema none     ROM / Strength   AROM / PROM / Strength AROM     AROM   Overall AROM Comments BUE AROM WNLS     Hand Function   Right Hand Grip (lbs) 92 lbs   Left Hand Grip (lbs) 77 lbs                              OT Long Term Goals - 07/18/17 1211      OT LONG TERM GOAL #1   Title Pt to demo cooking task safely with external cues for safety/reminders   Time 4   Period Weeks   Status New   Target Date 08/17/17     OT LONG TERM GOAL #2   Title Pt to perform divided attention task b/t physical and visual scanning task in prep for future driving   Time 4   Period Weeks   Status New     OT LONG TERM GOAL #3   Title Pt to perform financial management task at 90% accuracy with external aids prn   Time 4   Period Weeks   Status New     OT LONG TERM GOAL #4   Title Improve Lt grip strength by 5 lbs    Baseline eval = 77 lbs (Rt = 92 lbs)    Time 4   Period Weeks   Status New               Plan - 07/18/17 1207    Clinical Impression Statement Pt is a 66 y.o. male who presents to outpatient rehab s/p STEMI on 06/26/17 with subsequent anoxic-ischemic encephalopathy and brain injury. Pt presents today with more apparent deficit in memory, followed by overall decreased strength/endurance. Pt would benefit from O.T. to address safety with IADL tasks, financial management, strength/endurance, and divided attention tasks   Occupational  Profile and client history currently impacting functional performance PMH: HTN, GERD. Activity restrictions and lifting restrictions, and no driving limit pt's leisure activities and social participation  Occupational performance deficits (Please refer to evaluation for details): IADL's;Leisure;Social Participation   Rehab Potential Excellent   OT Frequency 2x / week   OT Duration 4 weeks  anticipate only 2 weeks needed   OT Treatment/Interventions Self-care/ADL training;DME and/or AE instruction;Patient/family education;Therapeutic exercises;Therapeutic activities;Functional Mobility Training;Cognitive remediation/compensation;Energy conservation   Plan financial management task - money exchange worksheet, balancing checkbook   Clinical Decision Making Limited treatment options, no task modification necessary   Consulted and Agree with Plan of Care Patient      Patient will benefit from skilled therapeutic intervention in order to improve the following deficits and impairments:  Decreased activity tolerance, Decreased cognition, Decreased strength, Impaired perceived functional ability  Visit Diagnosis: Frontal lobe and executive function deficit  Muscle weakness (generalized)      G-Codes - 08-09-2017 1232    Functional Assessment Tool Used (Outpatient only) safety/cognition with IADLS based on clinical judgement - requires supervision/cues   Functional Limitation Self care   Self Care Current Status (Y0737) At least 20 percent but less than 40 percent impaired, limited or restricted   Self Care Goal Status (T0626) At least 1 percent but less than 20 percent impaired, limited or restricted      Problem List Patient Active Problem List   Diagnosis Date Noted  . Benign essential HTN   . History of cardiac arrest   . Streptococcal sore throat   . 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 Cardiac device in situ   . Gastroesophageal reflux disease   . Acute systolic  congestive heart failure ([place])   . Takatsuki syndrome   . Leukocytosis   . Prediabetes   . HCAP (healthcare-associated pneumonia)   . Agitation   . Acute encephalopathy   . Bradycardia   . Essential hypertension   . Abdominal distention   . Encounter for imaging study to confirm orogastric (OG) tube placement   . Acute respiratory failure with hypoxia ([place])   . Encounter for central line placement   . Ventricular fibrillation ([place]) 07/18/17  . Sudden cardiac death ([place]) 18-Jul-2017  . ST elevation myocardial infarction (STEMI) [facility])     Carey Bullocks, OTR/L Aug 09, 2017, 12:38 PM  [facility] [address] [place], [place], [address] Phone: [phone]   Fax:  [phone]  Name: Jeffery Thomas MRN: [id number] Date of Birth: May 13, 1951

## 2017-07-25 ENCOUNTER — Encounter: Payer: Self-pay | Admitting: Cardiovascular Disease

## 2017-07-25 ENCOUNTER — Ambulatory Visit: Admitting: *Deleted

## 2017-07-25 ENCOUNTER — Ambulatory Visit: Admitting: Occupational Therapy

## 2017-07-25 ENCOUNTER — Ambulatory Visit (INDEPENDENT_AMBULATORY_CARE_PROVIDER_SITE_OTHER): Admitting: Cardiovascular Disease

## 2017-07-25 VITALS — Wt 192.8 lb

## 2017-07-25 DIAGNOSIS — I469 Cardiac arrest, cause unspecified: Secondary | ICD-10-CM

## 2017-07-25 DIAGNOSIS — I10 Essential (primary) hypertension: Secondary | ICD-10-CM | POA: Diagnosis not present

## 2017-07-25 DIAGNOSIS — I5021 Acute systolic (congestive) heart failure: Secondary | ICD-10-CM | POA: Diagnosis not present

## 2017-07-25 DIAGNOSIS — R41841 Cognitive communication deficit: Secondary | ICD-10-CM

## 2017-07-25 DIAGNOSIS — R2681 Unsteadiness on feet: Secondary | ICD-10-CM | POA: Diagnosis not present

## 2017-07-25 DIAGNOSIS — E785 Hyperlipidemia, unspecified: Secondary | ICD-10-CM | POA: Diagnosis not present

## 2017-07-25 DIAGNOSIS — R41844 Frontal lobe and executive function deficit: Secondary | ICD-10-CM

## 2017-07-25 MED ORDER — CARVEDILOL 6.25 MG PO TABS: 6.2500 mg | ORAL_TABLET | Freq: Two times a day (BID) | ORAL | 0 refills | Status: DC

## 2017-07-25 NOTE — Patient Instructions (Signed)
Homework: 1. Write TV channels down from memory, compare with TV afterwards  2. Begin planning more meals, make grocery lists and organize a time to go shopping  3. Continue with household responsibilities  4. Try filling med organizer independently  5. Managing finances  6. Continue on-line activities  7. How much golf can you do?  8. Go for walks   9. Think of something new to learn to play, speak, do, make

## 2017-07-25 NOTE — Progress Notes (Signed)
07/25/2017 Jeffery Thomas   03-Apr-1951  [id number]  Primary Physician Jeffery Limbo, MD Primary Cardiologist: Jeffery Harp MD FACP, [place], Jeffery Thomas, [place]  HPI:  Jeffery Thomas is a 66 y.o. male married, father of one with no grandchildren who has not worked the last 55 years previous accompanied by his wife Jeffery Thomas. He had witnessed cardiac death on the evening of 2017-07-15. He had fairly immediate CPR after the EMS was called and was shocked 3 times. He had CPR and had R OSC in 20 minutes. He is transported to [facility] where I performed urgent cardiac catheterization revealing normal coronary arteries and severe LV dysfunction consistent with Takatsubo syndrome. 8 days later he had an ICD implanted by Dr. Rayann Thomas for secondary prevention. He was discharged home from the hospital 3 weeks ago. Prior to his presentation he did have a history of treated hypertension and hyperlipidemia. His mother died of a myocardial infarction at age 17. He does smoke cigarettes or playing golf and prior to this drank several hard drinks of liquor a night. Since that time his drinking and has been markedly limited. Since discharge she's been doing well. He has no recollection of the event or one week prior to the event. He has no neurologic deficits otherwise.   Current Meds  Medication Sig  . carvedilol (COREG) 6.25 MG tablet Take 1 tablet (6.25 mg total) by mouth 2 (two) times daily with a meal.  . clonazePAM (KLONOPIN) 0.5 MG tablet Take 0.5 tablets (0.25 mg total) by mouth 2 (two) times daily as needed (anxiety).  Marland Kitchen levocetirizine (XYZAL) 5 MG tablet Take 5 mg by mouth every morning.   Marland Kitchen losartan (COZAAR) 50 MG tablet Take 1 tablet (50 mg total) by mouth daily.  . montelukast (SINGULAIR) 10 MG tablet Take 10 mg by mouth every morning.   Marland Kitchen omeprazole (PRILOSEC) 20 MG capsule Take 40 mg by mouth daily.  Marland Kitchen spironolactone (ALDACTONE) 25 MG tablet Take 0.5 tablets (12.5 mg total) by mouth 2 (two)  times daily.  . [DISCONTINUED] carvedilol (COREG) 3.125 MG tablet Take 1 tablet (3.125 mg total) by mouth 2 (two) times daily with a meal.  . [DISCONTINUED] ranitidine (ZANTAC) 150 MG tablet Take 150 mg by mouth 2 (two) times daily.     No Known Allergies  Social History   Social History  . Marital status: Unknown    Spouse name: N/A  . Number of children: N/A  . Years of education: N/A   Occupational History  . Not on file.   Social History Main Topics  . Smoking status: Never Smoker  . Smokeless tobacco: Never Used  . Alcohol use Not on file  . Drug use: Unknown  . Sexual activity: Not on file   Other Topics Concern  . Not on file   Social History Narrative  . No narrative on file     Review of Systems: General: negative for chills, fever, night sweats or weight changes.  Cardiovascular: negative for chest pain, dyspnea on exertion, edema, orthopnea, palpitations, paroxysmal nocturnal dyspnea or shortness of breath Dermatological: negative for rash Respiratory: negative for cough or wheezing Urologic: negative for hematuria Abdominal: negative for nausea, vomiting, diarrhea, bright red blood per rectum, melena, or hematemesis Neurologic: negative for visual changes, syncope, or dizziness All other systems reviewed and are otherwise negative except as noted above.    Weight 192 lb 12.8 oz (87.5 kg).  General appearance: alert and no distress Neck: no  adenopathy, no carotid bruit, no JVD, supple, symmetrical, trachea midline and thyroid not enlarged, symmetric, no tenderness/mass/nodules Lungs: clear to auscultation bilaterally Heart: regular rate and rhythm, S1, S2 normal, no murmur, click, rub or gallop Extremities: extremities normal, atraumatic, no cyanosis or edema Pulses: 2+ and symmetric Skin: Skin color, texture, turgor normal. No rashes or lesions Neurologic: Alert and oriented X 3, normal strength and tone. Normal symmetric reflexes. Normal coordination  and gait  EKG not performed today  ASSESSMENT AND PLAN:   Sudden cardiac death [facility]) Jeffery Thomas presents today after a discharge 3 weeks ago from [facility] after presenting with sudden cardiac death/PA arrest. He was resuscitated, was shocked 3 times with an AED and had 20 minutes of CPR ultimately getting return of spontaneous circulation. I took him to the cath lab the evening of 06/26/17 revealing normal coronary arteries with severe LV dysfunction. He underwent systemic cooling with the [place] protocol with ICD implanted by Dr. Rayann Thomas on 07/04/17 for secondary prevention. He was discharged home 3 weeks  and has been slowly recuperating doing well.  Essential hypertension History of essential hypertension blood pressure measured 138/89. He is on carvedilol, losartan and Aldactone. Continue current meds at current dosing      Jeffery Harp MD [facility], [facility] 07/25/2017 2:31 PM

## 2017-07-25 NOTE — Therapy (Signed)
[facility] [address] [place], [place], [address] Phone: [phone]   Fax:  [phone]  Speech Language Pathology Treatment  Patient Details  Name: Jeffery Thomas MRN: [id number] Date of Birth: [birth year] Referring Provider: Oval Linsey, MD  Encounter Date: 07/25/2017      End of Session - 07/25/17 1342    Visit Number 2   Number of Visits 17   Date for SLP Re-Evaluation 09/22/17   SLP Start Time 1150   SLP Stop Time  1230   SLP Time Calculation (min) 40 min   Activity Tolerance Patient tolerated treatment well      Past Medical History:  Diagnosis Date  . Hypertension     Past Surgical History:  Procedure Laterality Date  . ICD IMPLANT N/A 07/04/2017   Procedure: ICD Implant;  Surgeon: Thompson Grayer, MD;  Location: [facility];  Service: Cardiovascular;  Laterality: N/A;  . LEFT HEART CATH AND CORONARY ANGIOGRAPHY N/A 06/26/2017   Procedure: LEFT HEART CATH AND CORONARY ANGIOGRAPHY;  Surgeon: Lorretta Harp, MD;  Location: [facility];  Service: Cardiovascular;  Laterality: N/A;    There were no vitals filed for this visit.      Subjective Assessment - 07/25/17 1200    Subjective Pt reports difficulty with recall of events the month prior to hospitalization, but he and wife indicate improvement in recall and independence at home.   Patient is accompained by: Family member  wife [name]   Currently in Pain? No/denies               ADULT SLP TREATMENT - 07/25/17 0001      General Information   Behavior/Cognition Alert;Cooperative;Pleasant mood     Treatment Provided   Treatment provided Cognitive-Linquistic     Pain Assessment   Pain Assessment No/denies pain     Cognitive-Linquistic Treatment   Treatment focused on Cognition;Patient/family/caregiver education   Skilled Treatment Skilled ST session focused on review of initial evaluation and goals established. Pt  and wife agreeable to goals set. Pt reports difficulty with recall of the events prior to hospitalization, but has been doing more at home like he used to (cooking, cleaning, shopping). The [employer] Verbal Learning Test was administered. By trial 3, pt was able to list 10/11 words, and recognized 11/11 words. Pt was encouraged to increase cognitive activity at work, including household responsibilities, filling med Environmental education officer independently, increase physical activity (once ok with MD), online cognitive activities. Pt/wife were also encouraged to evaluate where pt is independent, and were difficulty with recall is stil present.     Assessment / Recommendations / Plan   Plan Continue with current plan of care     Progression Toward Goals   Progression toward goals Progressing toward goals          SLP Education - 07/25/17 1341    Education provided Yes   Education Details goals established, home activities to stimulate and improve functional recall   Person(s) Educated Patient;Spouse   Methods Explanation;Demonstration;Handout;Verbal cues   Comprehension Verbalized understanding          SLP Short Term Goals - 07/25/17 1345      SLP SHORT TERM GOAL #1   Title pt will tell SLP 4 memory strategies with modified independence   Baseline phone, computer, chalkboard   Time 4   Period Weeks   Status On-going     SLP SHORT TERM GOAL #2   Title pt will use  at least one memory strategy in 4 sessions    Time 4   Period Weeks   Status On-going     SLP SHORT TERM GOAL #3   Title pt will report <50% assistance from wife for med manangement   Time 4   Period Weeks   Status On-going     SLP SHORT TERM GOAL #4   Title pt will functionally use a reminder system for appointments with rare min A   Time 4   Period Weeks   Status On-going          SLP Long Term Goals - 07/25/17 [place] #1   Title pt will functionally use a reminder system for appointments over two  weeks   Time 8   Period Weeks   Status On-going     SLP LONG TERM GOAL #2   Title pt will use at least one memory strategy in 8 (total) therapy sessions   Time 8   Period Weeks   Status On-going     SLP LONG TERM GOAL #3   Title pt will manage meds with modified independence over two weeks   Time 8   Period Weeks   Status On-going     SLP LONG TERM GOAL #4   Title pt will use a memory compensation system of his choosing successfully without cues for two weeks   Time 8   Period Weeks   Status On-going          Plan - 07/25/17 1342    Clinical Impression Statement Pt reports continued improvement in functional recall, aside from the month surrounding his hospitalization. Home tasks were given to challenge functional recall. HVLT revealed no difficulty - 11/11 words listed by trial 3, and pt recognized 11/11 words without error. Short term ST intervention is recommended to facilitate increased independence and functional recall.    Speech Therapy Frequency 2x / week   Duration --  8 weeks/17 sessions   Treatment/Interventions Compensatory strategies;Functional tasks;Cueing hierarchy;Patient/family education;Cognitive reorganization;Internal/external aids;SLP instruction and feedback   Potential to Achieve Goals Good   Potential Considerations Ability to learn/carryover information;Family/community support;Cooperation/participation level;Severity of impairments;Previous level of function   SLP Home Exercise Plan reviewed, provided   Consulted and Agree with Plan of Care Patient      Patient will benefit from skilled therapeutic intervention in order to improve the following deficits and impairments:   Cognitive communication deficit    Problem List Patient Active Problem List   Diagnosis Date Noted  . Benign essential HTN   . History of cardiac arrest   . Streptococcal sore throat   . Anoxic-ischemic encephalopathy ([place]) 07/05/2017  . Cardiac device in situ   .  Gastroesophageal reflux disease   . Acute systolic congestive heart failure ([place])   . Takatsuki syndrome   . Leukocytosis   . Prediabetes   . HCAP (healthcare-associated pneumonia)   . Agitation   . Acute encephalopathy   . Bradycardia   . Essential hypertension   . Abdominal distention   . Encounter for imaging study to confirm orogastric (OG) tube placement   . Acute respiratory failure with hypoxia ([place])   . Encounter for central line placement   . Ventricular fibrillation ([place]) Jul 18, 2017  . Sudden cardiac death ([place]) 18-Jul-2017  . ST elevation myocardial infarction (STEMI) ([place])    Celia B. Quentin Ore, [facility], [place] Speech Language Pathologist  Shonna Chock 07/25/2017, 1:47 PM  [facility] [address] [place], [place], [address] Phone: [phone]   Fax:  [phone]   Name: AIDYNN KRENN MRN: [id number] Date of Birth: 15-Apr-1951

## 2017-07-25 NOTE — Patient Instructions (Signed)
Medication Instructions: Increase Carvedilol to 6.25 mg twice daily.   Labwork: Your physician recommends that you return for a FASTING lipid profile and hepatic function panel.   Testing/Procedures: Your physician has requested that you have an echocardiogram in 3 months--prior to appt with Dr. Gwenlyn Found. Echocardiography is a painless test that uses sound waves to create images of your heart. It provides your doctor with information about the size and shape of your heart and how well your heart's chambers and valves are working. This procedure takes approximately one hour. There are no restrictions for this procedure.  Follow-Up: Your physician recommends that you schedule a follow-up appointment in: 2 weeks with PharmD for Medication Titration (Carvedilol).  Your physician recommends that you schedule a follow-up appointment in: 3 months with Dr. Gwenlyn Found.  If you need a refill on your cardiac medications before your next appointment, please call your pharmacy.

## 2017-07-25 NOTE — Assessment & Plan Note (Signed)
History of essential hypertension blood pressure measured 138/89. He is on carvedilol, losartan and Aldactone. Continue current meds at current dosing

## 2017-07-25 NOTE — Therapy (Signed)
[facility] [address] [place], [place], [address] Phone: [phone]   Fax:  [phone]  Occupational Therapy Treatment  Patient Details  Name: Jeffery Thomas MRN: [id number] Date of Birth: November 14, 1950 Referring Provider: Dr. Donetta Potts  Encounter Date: 07/25/2017      OT End of Session - 07/25/17 1128    Visit Number 2   Number of Visits 8   Date for OT Re-Evaluation 08/17/17   Authorization Type TRICARE    Authorization - Visit Number 2   Authorization - Number of Visits 10   OT Start Time 0930   OT Stop Time 1015   OT Time Calculation (min) 45 min   Activity Tolerance Patient tolerated treatment well      Past Medical History:  Diagnosis Date  . Hypertension     Past Surgical History:  Procedure Laterality Date  . ICD IMPLANT N/A 07/04/2017   Procedure: ICD Implant;  Surgeon: [place] Grayer, MD;  Location: [facility];  Service: Cardiovascular;  Laterality: N/A;  . LEFT HEART CATH AND CORONARY ANGIOGRAPHY N/A 06/26/2017   Procedure: LEFT HEART CATH AND CORONARY ANGIOGRAPHY;  Surgeon: Lorretta Harp, MD;  Location: [facility];  Service: Cardiovascular;  Laterality: N/A;    There were no vitals filed for this visit.      Subjective Assessment - 07/25/17 0936    Subjective  I'm doing fine   Pertinent History 06/26/17: STEMI (Myocardial infarction) w/ subsequent anoxic-ischemic encephalopathy BI   Limitations **Defibrillator, no driving for 6 months; no lifting, golfing, or strenous activity til cleared by MD; family to assist with medication management   Patient Stated Goals To do it and get out of it, and get some of my memory back   Currently in Pain? No/denies                      OT Treatments/Exercises (OP) - 07/25/17 0001      Cognitive Exercises   Financial Management Balancing Checkbook Pt balancing a checkbook with multiple debits and 2 deposits using  calculator with 2 errors out of 11 transactions (9/11 correct = 82% accuracy). However initial error would have made all future calculations wrong. Pt entered all transactions correctly in register but errored in calculations even with use of calculator. Due to time constraints, therapist unable to follow up with pt re: errors and will review next session   Financial Management [place] exchange worksheet: 12/17 correct on 1st attempt (71% accuracy). Pt required extra time to complete                     OT Long Term Goals - 07/25/17 1128      OT LONG TERM GOAL #1   Title Pt to demo cooking task safely with external cues for safety/reminders   Time 4   Period Weeks   Status New     OT LONG TERM GOAL #2   Title Pt to perform divided attention task b/t physical and visual scanning task in prep for future driving   Time 4   Period Weeks   Status New     OT LONG TERM GOAL #3   Title Pt to perform financial management task at 90% accuracy with external aids prn   Time 4   Period Weeks   Status On-going     OT LONG TERM GOAL #4   Title Improve Lt grip strength by 5  lbs    Baseline eval = 77 lbs (Rt = 92 lbs)    Time 4   Period Weeks   Status New               Plan - 07/25/17 1129    Clinical Impression Statement Pt making multiple errors in money exchange which pt reports he would have done prior to BI. Pt however made min errors in financial management even with use of calculator   Rehab Potential Excellent   OT Frequency 2x / week   OT Duration 4 weeks   OT Treatment/Interventions Self-care/ADL training;DME and/or AE instruction;Patient/family education;Therapeutic exercises;Therapeutic activities;Functional Mobility Training;Cognitive remediation/compensation;Energy conservation   Plan review errors in financial management, issue putty HEP, address LTG #2   Consulted and Agree with Plan of Care Patient      Patient will benefit from skilled  therapeutic intervention in order to improve the following deficits and impairments:  Decreased activity tolerance, Decreased cognition, Decreased strength, Impaired perceived functional ability  Visit Diagnosis: Frontal lobe and executive function deficit    Problem List Patient Active Problem List   Diagnosis Date Noted  . Benign essential HTN   . History of cardiac arrest   . Streptococcal sore throat   . 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 Cardiac device in situ   . Gastroesophageal reflux disease   . Acute systolic congestive heart failure ([facility])   . Takatsuki syndrome   . Leukocytosis   . Prediabetes   . HCAP (healthcare-associated pneumonia)   . Agitation   . Acute encephalopathy   . Bradycardia   . Essential hypertension   . Abdominal distention   . Encounter for imaging study to confirm orogastric (OG) tube placement   . Acute respiratory failure with hypoxia ([place])   . Encounter for central line placement   . Ventricular fibrillation ([place]) 07-13-17  . Sudden cardiac death ([facility]) Jul 13, 2017  . ST elevation myocardial infarction (STEMI) [facility])     Carey Bullocks, OTR/L 07/25/2017, 11:31 AM  [facility] [address] [place], [place], [address] Phone: [phone]   Fax:  [phone]  Name: Jeffery Thomas MRN: [id number] Date of Birth: [birth year]

## 2017-07-25 NOTE — Assessment & Plan Note (Signed)
[name] presents today after a discharge 3 weeks ago from [facility] after presenting with sudden cardiac death/PA arrest. He was resuscitated, was shocked 3 times with an AED and had 20 minutes of CPR ultimately getting return of spontaneous circulation. I took him to the cath lab the evening of 06/26/17 revealing normal coronary arteries with severe LV dysfunction. He underwent systemic cooling with the [place] protocol with ICD implanted by Dr. Rayann Heman on 07/04/17 for secondary prevention. He was discharged home 3 weeks  and has been slowly recuperating doing well.

## 2017-07-26 ENCOUNTER — Ambulatory Visit: Admitting: Occupational Therapy

## 2017-07-26 DIAGNOSIS — R2681 Unsteadiness on feet: Secondary | ICD-10-CM | POA: Diagnosis not present

## 2017-07-26 DIAGNOSIS — R41844 Frontal lobe and executive function deficit: Secondary | ICD-10-CM

## 2017-07-26 DIAGNOSIS — M6281 Muscle weakness (generalized): Secondary | ICD-10-CM

## 2017-07-26 NOTE — Therapy (Signed)
[facility] [address] [place], [place], [address] Phone: [phone]   Fax:  [phone]  Occupational Therapy Treatment  Patient Details  Name: Jeffery Thomas MRN: [id number] Date of Birth: 02-04-1951 Referring Provider: Dr. Donetta Potts  Encounter Date: 07/26/2017      OT End of Session - 07/26/17 1406    Visit Number 3   Number of Visits 8   Date for OT Re-Evaluation 08/17/17   Authorization Type TRICARE    Authorization - Visit Number 3   Authorization - Number of Visits 10   OT Start Time 6283   OT Stop Time 1315   OT Time Calculation (min) 45 min   Activity Tolerance Patient tolerated treatment well      Past Medical History:  Diagnosis Date  . Hypertension     Past Surgical History:  Procedure Laterality Date  . ICD IMPLANT N/A 07/04/2017   Procedure: ICD Implant;  Surgeon: [place] Grayer, MD;  Location: [facility];  Service: Cardiovascular;  Laterality: N/A;  . LEFT HEART CATH AND CORONARY ANGIOGRAPHY N/A 06/26/2017   Procedure: LEFT HEART CATH AND CORONARY ANGIOGRAPHY;  Surgeon: Lorretta Harp, MD;  Location: [facility];  Service: Cardiovascular;  Laterality: N/A;    There were no vitals filed for this visit.      Subjective Assessment - 07/26/17 1231    Pertinent History 06/26/17: STEMI (Myocardial infarction) w/ subsequent anoxic-ischemic encephalopathy BI   Limitations **Defibrillator, no driving for 6 months; no lifting, golfing, or strenous activity til cleared by MD; family to assist with medication management   Patient Stated Goals To do it and get out of it, and get some of my memory back   Currently in Pain? No/denies                      OT Treatments/Exercises (OP) - 07/26/17 0001      ADLs   ADL Comments Reviewed previous errors in calculation with financial management tasks and discussed     Cognitive Exercises   Other Cognitive Exercises 1  simple money management word problems with 1 error in calculation (not using calculator). Pt then given word problem with percentages and allowed to use calcuator - pt able to perform accurately after initial instruction on how to properly enter percentage into calculator     Exercises   Exercises Hand     Hand Exercises   Other Hand Exercises Pt issued putty HEP and return demo - see pt instructions for details. Pt issued blue putty     Visual/Perceptual Exercises   Scanning Environmental   Scanning - Environmental Environmental scanning while performing physical task for divided attention, visual scanning, and coordination. Pt only found 3/12 items on first trial (25% accuracy), 2nd trial found 7/12 items (58% accuracy) with 5 on Rt, 2 on Lt, 3rd trial only found 1 more item. Pt also noted to have mild difficulty multi-tasking with physical task     Exercises: UBE x 8 min. Level 3 for strength/endurance            OT Education - 07/26/17 1236    Education provided Yes   Education Details Putty HEP    Person(s) Educated Patient   Methods Explanation;Demonstration;Handout   Comprehension Verbalized understanding;Returned demonstration             OT Long Term Goals - 07/26/17 1407      OT LONG TERM GOAL #1  Title Pt to demo cooking task safely with external cues for safety/reminders   Time 4   Period Weeks   Status New     OT LONG TERM GOAL #2   Title Pt to perform divided attention task b/t physical and visual scanning task in prep for future driving   Time 4   Period Weeks   Status On-going     OT LONG TERM GOAL #3   Title Pt to perform financial management task at 90% accuracy with external aids prn   Time 4   Period Weeks   Status On-going     OT LONG TERM GOAL #4   Title Improve Lt grip strength by 5 lbs    Baseline eval = 77 lbs (Rt = 92 lbs)    Time 4   Period Weeks   Status On-going               Plan - 07/26/17 1407    Clinical  Impression Statement Pt progressing towards goals however anticipate may need more therapy than initially expected d/t difficulty with divided attention and environmental scanning.    Rehab Potential Excellent   OT Frequency 2x / week   OT Duration 4 weeks   OT Treatment/Interventions Self-care/ADL training;DME and/or AE instruction;Patient/family education;Therapeutic exercises;Therapeutic activities;Functional Mobility Training;Cognitive remediation/compensation;Energy conservation   Plan Cooking task (pt to bring in food)    Consulted and Agree with Plan of Care Patient      Patient will benefit from skilled therapeutic intervention in order to improve the following deficits and impairments:  Decreased activity tolerance, Decreased cognition, Decreased strength, Impaired perceived functional ability  Visit Diagnosis: Frontal lobe and executive function deficit  Muscle weakness (generalized)    Problem List Patient Active Problem List   Diagnosis Date Noted  . Benign essential HTN   . History of cardiac arrest   . Streptococcal sore throat   . 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 Cardiac device in situ   . Gastroesophageal reflux disease   . Acute systolic congestive heart failure ([place])   . Takatsuki syndrome   . Leukocytosis   . Prediabetes   . HCAP (healthcare-associated pneumonia)   . Agitation   . Acute encephalopathy   . Bradycardia   . Essential hypertension   . Abdominal distention   . Encounter for imaging study to confirm orogastric (OG) tube placement   . Acute respiratory failure with hypoxia ([facility])   . Encounter for central line placement   . Ventricular fibrillation ([facility]) 28-Jun-2017  . Sudden cardiac death ([place]) 06-28-17  . ST elevation myocardial infarction (STEMI) [facility])     Carey Bullocks, OTR/L 07/26/2017, 2:13 PM  [facility] [address] [place], [place],  [address] Phone: [phone]   Fax:  [phone]  Name: KYL GIVLER MRN: [id number] Date of Birth: [birth year]

## 2017-07-26 NOTE — Patient Instructions (Signed)
  1. Grip Strengthening (Resistive Putty)   Squeeze putty using thumb and all fingers. Repeat _20___ times. Do __2__ sessions per day.   2. Roll putty into tube on table and pinch between each finger and thumb x 10 reps each. (Do ring and small finger together)

## 2017-07-28 ENCOUNTER — Ambulatory Visit: Admitting: *Deleted

## 2017-07-28 DIAGNOSIS — R41841 Cognitive communication deficit: Secondary | ICD-10-CM

## 2017-07-28 DIAGNOSIS — R2681 Unsteadiness on feet: Secondary | ICD-10-CM | POA: Diagnosis not present

## 2017-07-28 NOTE — Therapy (Signed)
[facility] [address] [place], [place], [address] Phone: [phone]   Fax:  [phone]  Speech Language Pathology Treatment  Patient Details  Name: Jeffery Thomas MRN: [id number] Date of Birth: July 12, 1951 Referring Provider: Oval Linsey, MD  Encounter Date: 07/28/2017      End of Session - 07/28/17 1239    Visit Number 3   Number of Visits 17   Date for SLP Re-Evaluation 09/22/17   SLP Start Time 7741   SLP Stop Time  1239   SLP Time Calculation (min) 54 min   Activity Tolerance Patient tolerated treatment well      Past Medical History:  Diagnosis Date  . Hypertension     Past Surgical History:  Procedure Laterality Date  . ICD IMPLANT N/A 07/04/2017   Procedure: ICD Implant;  Surgeon: [place] Grayer, MD;  Location: [facility];  Service: Cardiovascular;  Laterality: N/A;  . LEFT HEART CATH AND CORONARY ANGIOGRAPHY N/A 06/26/2017   Procedure: LEFT HEART CATH AND CORONARY ANGIOGRAPHY;  Surgeon: Lorretta Harp, MD;  Location: [facility];  Service: Cardiovascular;  Laterality: N/A;    There were no vitals filed for this visit.      Subjective Assessment - 07/28/17 1140    Subjective No pain except that I can't drive or play golf. That's a pain.   Currently in Pain? No/denies               ADULT SLP TREATMENT - 07/28/17 0001      General Information   Behavior/Cognition Alert;Cooperative;Pleasant mood     Treatment Provided   Treatment provided Cognitive-Linquistic     Pain Assessment   Pain Assessment No/denies pain     Cognitive-Linquistic Treatment   Treatment focused on Cognition;Patient/family/caregiver education   Skilled Treatment ST session focused on review of homework initially. Pt had completed all tasks given, including filling med organizer indpendently. This was done without error. Pt required mod-max assistance to complete map construction task. Cues  required to redirect after interruption.      Assessment / Recommendations / Plan   Plan Continue with current plan of care     Progression Toward Goals   Progression toward goals Progressing toward goals          SLP Education - 07/28/17 1239    Education provided Yes   Education Details Ded reasoning 1-3   Person(s) Educated Patient   Methods Demonstration;Verbal cues;Handout;Explanation   Comprehension Verbalized understanding;Returned demonstration;Verbal cues required;Need further instruction          SLP Short Term Goals - 07/28/17 1241      SLP SHORT TERM GOAL #1   Title pt will tell SLP 4 memory strategies with modified independence   Baseline phone, computer, chalkboard, lists, folder   Status Achieved     SLP SHORT TERM GOAL #2   Title pt will use at least one memory strategy in 4 sessions    Baseline 07/28/17   Period Weeks   Status On-going     SLP [place] #3   Title pt will report <50% assistance from wife for med manangement   Baseline filled independently, accurately 07/28/17   Time 4   Period Weeks   Status On-going     SLP SHORT TERM GOAL #4   Title pt will functionally use a reminder system for appointments with rare min A   Time 4   Period Weeks   Status On-going  SLP Long Term Goals - 07/28/17 1242      SLP LONG TERM GOAL #1   Title pt will functionally use a reminder system for appointments over two weeks   Baseline 07/28/17   Time 8   Period Weeks   Status On-going     SLP LONG TERM GOAL #2   Title pt will use at least one memory strategy in 8 (total) therapy sessions   Baseline 07/28/17   Time 8   Period Weeks   Status On-going     SLP LONG TERM GOAL #3   Title pt will manage meds with modified independence over two weeks   Baseline 07/28/17   Time 8   Period Weeks   Status On-going     SLP LONG TERM GOAL #4   Title pt will use a memory compensation system of his choosing successfully without cues for two  weeks   Baseline folder, info on phone   Time 8   Period Weeks   Status On-going          Plan - 07/28/17 1239    Clinical Impression Statement Pt continues to demonstrate improvement in recall and organization. Pt reports difficulty with higher levels of attention. Difficulty noted with map construction task. Continued ST intervention is recommended to maximize high level cognition for independence and safety.   Speech Therapy Frequency 2x / week   Duration 1 week  8 weeks/17 sessions   Treatment/Interventions Compensatory strategies;Functional tasks;Cueing hierarchy;Patient/family education;Cognitive reorganization;Internal/external aids;SLP instruction and feedback   Potential to Achieve Goals Good   Potential Considerations Ability to learn/carryover information;Family/community support;Cooperation/participation level;Severity of impairments;Previous level of function   SLP Home Exercise Plan reviewed, provided   Consulted and Agree with Plan of Care Patient      Patient will benefit from skilled therapeutic intervention in order to improve the following deficits and impairments:   Cognitive communication deficit    Problem List Patient Active Problem List   Diagnosis Date Noted  . Benign essential HTN   . History of cardiac arrest   . Streptococcal sore throat   . 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 Cardiac device in situ   . Gastroesophageal reflux disease   . Acute systolic congestive heart failure ([place])   . Takatsuki syndrome   . Leukocytosis   . Prediabetes   . HCAP (healthcare-associated pneumonia)   . Agitation   . Acute encephalopathy   . Bradycardia   . Essential hypertension   . Abdominal distention   . Encounter for imaging study to confirm orogastric (OG) tube placement   . Acute respiratory failure with hypoxia ([place])   . Encounter for central line placement   . Ventricular fibrillation ([place]) July 06, 2017  . Sudden cardiac death ([place])  2017-07-06  . ST elevation myocardial infarction (STEMI) ([place])    Annie Roseboom B. Quentin Ore [facility], CCC-SLP Speech Language Pathologist  Shonna Chock 07/28/2017, 12:43 PM  [facility] [address] [place], [place], [address] Phone: [phone]   Fax:  [phone]   Name: Jeffery Thomas MRN: [id number] Date of Birth: 1950/12/29

## 2017-07-31 ENCOUNTER — Ambulatory Visit: Admitting: Occupational Therapy

## 2017-07-31 DIAGNOSIS — R41844 Frontal lobe and executive function deficit: Secondary | ICD-10-CM

## 2017-07-31 DIAGNOSIS — R2681 Unsteadiness on feet: Secondary | ICD-10-CM | POA: Diagnosis not present

## 2017-07-31 NOTE — Therapy (Signed)
[facility] [address] [place], [place], [address] Phone: [phone]   Fax:  [phone]  Occupational Therapy Treatment  Patient Details  Name: Jeffery Thomas MRN: [id number] Date of Birth: 06-Mar-1951 Referring Provider: Dr. Donetta Potts  Encounter Date: 07/31/2017      OT End of Session - 07/31/17 1305    Visit Number 4   Number of Visits 8   Date for OT Re-Evaluation 08/17/17   Authorization Type TRICARE    Authorization - Visit Number 4   Authorization - Number of Visits 10   OT Start Time 5638   OT Stop Time 1305   OT Time Calculation (min) 35 min   Activity Tolerance Patient tolerated treatment well      Past Medical History:  Diagnosis Date  . Hypertension     Past Surgical History:  Procedure Laterality Date  . ICD IMPLANT N/A 07/04/2017   Procedure: ICD Implant;  Surgeon: [place] Grayer, MD;  Location: [facility];  Service: Cardiovascular;  Laterality: N/A;  . LEFT HEART CATH AND CORONARY ANGIOGRAPHY N/A 06/26/2017   Procedure: LEFT HEART CATH AND CORONARY ANGIOGRAPHY;  Surgeon: Lorretta Harp, MD;  Location: [facility];  Service: Cardiovascular;  Laterality: N/A;    There were no vitals filed for this visit.      Subjective Assessment - 07/31/17 1230    Subjective  I changed my mind. I don't want to cook now   Pertinent History 06/26/17: STEMI (Myocardial infarction) w/ subsequent anoxic-ischemic encephalopathy BI   Limitations **Defibrillator, no driving for 6 months; no lifting, golfing, or strenous activity til cleared by MD; family to assist with medication management   Patient Stated Goals To do it and get out of it, and get some of my memory back   Currently in Pain? No/denies                      OT Treatments/Exercises (OP) - 07/31/17 0001      ADLs   ADL Comments Pt did not wish to pursue cooking therefore deferred goal. Pt reports he is cooking  on grill, but wants wife to continue with all other cooking.      Cognitive Exercises   Financial Management Balancing Checkbook Pt given balance, 1 deposit and 3 debits and made 1 error in transcription which effected balance. Otherwise, pt did accurately. Recommended supervision and/or someone checking behind him before submitting payments. Pt agreed.      Exercises   Exercises Hand     Hand Exercises   Other Hand Exercises Gripper set at level 4 resistance to pick up blocks for sustained grip strength Lt hand. Pt with min difficulty and drops.      Visual/Perceptual Exercises   Scanning Environmental   Scanning - Environmental Environmental scanning while performing physical task for divided attention, visual scanning, and coordination. Pt only found 9/12 items on first trial (75% accuracy), 2nd trial found 2/3 remaining items. Pt missed lower items.                      OT Long Term Goals - 07/31/17 1231      OT LONG TERM GOAL #1   Title Pt to demo cooking task safely with external cues for safety/reminders   Time 4   Period Weeks   Status Deferred  per pt request     OT LONG TERM GOAL #2   Title  Pt to perform divided attention task b/t physical and visual scanning task in prep for future driving   Time 4   Period Weeks   Status On-going     OT LONG TERM GOAL #3   Title Pt to perform financial management task at 90% accuracy with external aids prn   Time 4   Period Weeks   Status Achieved  07/31/17: met in clinic with calculator. Still recommend supervision for accuracy     OT LONG TERM GOAL #4   Title Improve Lt grip strength by 5 lbs    Baseline eval = 77 lbs (Rt = 92 lbs)    Time 4   Period Weeks   Status On-going               Plan - 07/31/17 1307    Clinical Impression Statement Pt progressing towards goals. Pt wished to defer LTG #1. Pt with improved accuracy with financial management task but still recommended supervision for accuracy at  home. Pt also with improved accuracy with visual scanning during divided attention task.    Rehab Potential Excellent   OT Frequency 2x / week   OT Duration 4 weeks   OT Treatment/Interventions Self-care/ADL training;DME and/or AE instruction;Patient/family education;Therapeutic exercises;Therapeutic activities;Functional Mobility Training;Cognitive remediation/compensation;Energy conservation   Plan continue financial management task, continue divided attention task, re-assess grip strength and d/c next session   Consulted and Agree with Plan of Care Patient      Patient will benefit from skilled therapeutic intervention in order to improve the following deficits and impairments:  Decreased activity tolerance, Decreased cognition, Decreased strength, Impaired perceived functional ability  Visit Diagnosis: Frontal lobe and executive function deficit    Problem List Patient Active Problem List   Diagnosis Date Noted  . Benign essential HTN   . History of cardiac arrest   . Streptococcal sore throat   . 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 Cardiac device in situ   . Gastroesophageal reflux disease   . Acute systolic congestive heart failure ([place])   . Takatsuki syndrome   . Leukocytosis   . Prediabetes   . HCAP (healthcare-associated pneumonia)   . Agitation   . Acute encephalopathy   . Bradycardia   . Essential hypertension   . Abdominal distention   . Encounter for imaging study to confirm orogastric (OG) tube placement   . Acute respiratory failure with hypoxia ([place])   . Encounter for central line placement   . Ventricular fibrillation ([place]) Jul 07, 2017  . Sudden cardiac death ([place]) 07-07-17  . ST elevation myocardial infarction (STEMI) [facility])     Carey Bullocks, OTR/L 07/31/2017, 1:10 PM  [facility] [address] [place], [place], [address] Phone: [phone]   Fax:   [phone]  Name: Jeffery Thomas MRN: [id number] Date of Birth: 21-Aug-1951

## 2017-08-02 ENCOUNTER — Ambulatory Visit: Admitting: Occupational Therapy

## 2017-08-02 ENCOUNTER — Ambulatory Visit

## 2017-08-02 ENCOUNTER — Ambulatory Visit (HOSPITAL_COMMUNITY)

## 2017-08-02 DIAGNOSIS — R41844 Frontal lobe and executive function deficit: Secondary | ICD-10-CM

## 2017-08-02 DIAGNOSIS — R41841 Cognitive communication deficit: Secondary | ICD-10-CM

## 2017-08-02 DIAGNOSIS — R2681 Unsteadiness on feet: Secondary | ICD-10-CM | POA: Diagnosis not present

## 2017-08-02 NOTE — Therapy (Signed)
[facility] [address] [place], [place], [address] Phone: [phone]   Fax:  [phone]  Speech Language Pathology Treatment  Patient Details  Name: Jeffery Thomas MRN: [id number] Date of Birth: [birth year] Referring Provider: Oval Linsey, MD  Encounter Date: 08/02/2017      End of Session - 08/02/17 1714    Visit Number 4   Number of Visits 17   Date for SLP Re-Evaluation 09/22/17   SLP Start Time 1404   SLP Stop Time  1445   SLP Time Calculation (min) 41 min   Activity Tolerance Patient tolerated treatment well      Past Medical History:  Diagnosis Date  . Hypertension     Past Surgical History:  Procedure Laterality Date  . ICD IMPLANT N/A 07/04/2017   Procedure: ICD Implant;  Surgeon: Thompson Grayer, MD;  Location: [facility];  Service: Cardiovascular;  Laterality: N/A;  . LEFT HEART CATH AND CORONARY ANGIOGRAPHY N/A 06/26/2017   Procedure: LEFT HEART CATH AND CORONARY ANGIOGRAPHY;  Surgeon: Lorretta Harp, MD;  Location: [facility];  Service: Cardiovascular;  Laterality: N/A;    There were no vitals filed for this visit.             ADULT SLP TREATMENT - 08/02/17 1446      General Information   Behavior/Cognition Alert;Cooperative;Pleasant mood     Treatment Provided   Treatment provided Cognitive-Linquistic     Cognitive-Linquistic Treatment   Treatment focused on Cognition;Patient/family/caregiver education   Skilled Treatment SLP stressed to pt to take his time and double check answers, as detailed tasks resulted in pt making errors with decr'd emergent awareness until checking his answers (emermgent awarenss when checking answers 92%).      Assessment / Recommendations / Plan   Plan Continue with current plan of care     Progression Toward Goals   Progression toward goals Progressing toward goals          SLP Education - 08/02/17 1714    Education  provided Yes   Education Details need for recheck of answers/items with detailed verbal or written tasks   Person(s) Educated Patient   Methods Explanation   Comprehension Verbalized understanding          SLP Short Term Goals - 08/02/17 1716      SLP SHORT TERM GOAL #1   Title pt will tell SLP 4 memory strategies with modified independence   Baseline --   Status Achieved     SLP SHORT TERM GOAL #2   Title pt will use at least one memory strategy in 4 sessions    Baseline 07/28/17   Time 3   Period Weeks   Status On-going     SLP SHORT TERM GOAL #3   Title pt will report <50% assistance from wife for med manangement   Baseline filled independently, accurately 07/28/17   Time 3   Period Weeks   Status On-going     SLP SHORT TERM GOAL #4   Title pt will functionally use a reminder system for appointments with rare min A   Time 3   Period Weeks   Status On-going          SLP Long Term Goals - 08/02/17 1716      SLP LONG TERM GOAL #1   Title pt will functionally use a reminder system for appointments over two weeks   Baseline 07/28/17   Time 7  Period Weeks   Status On-going     SLP LONG TERM GOAL #2   Title pt will use at least one memory strategy in 8 (total) therapy sessions   Baseline 07/28/17   Time 7   Period Weeks   Status On-going     SLP LONG TERM GOAL #3   Title pt will manage meds with modified independence over two weeks   Baseline 07/28/17   Time 7   Period Weeks   Status On-going     SLP LONG TERM GOAL #4   Title pt will use a memory compensation system of his choosing successfully without cues for two weeks   Baseline folder, info on phone   Time 7   Period Weeks   Status On-going          Plan - 08/02/17 1714    Clinical Impression Statement Pt improvement in recall and organization noted, however difficulty today with detailed tasks, as emergent awareness was challenging until pt double checked answers. Continued ST intervention is  recommended to maximize high level cognition for independence and safety.   Speech Therapy Frequency 2x / week   Duration --  8 weeks/17 sessions   Treatment/Interventions Compensatory strategies;Functional tasks;Cueing hierarchy;Patient/family education;Cognitive reorganization;Internal/external aids;SLP instruction and feedback   Potential to Achieve Goals Good   Potential Considerations Ability to learn/carryover information;Family/community support;Cooperation/participation level;Severity of impairments;Previous level of function   SLP Home Exercise Plan reviewed, provided   Consulted and Agree with Plan of Care Patient      Patient will benefit from skilled therapeutic intervention in order to improve the following deficits and impairments:   Cognitive communication deficit    Problem List Patient Active Problem List   Diagnosis Date Noted  . Benign essential HTN   . History of cardiac arrest   . Streptococcal sore throat   . Anoxic-ischemic encephalopathy ([facility]) 07/05/2017  . Cardiac device in situ   . Gastroesophageal reflux disease   . Acute systolic congestive heart failure ([facility])   . Takatsuki syndrome   . Leukocytosis   . Prediabetes   . HCAP (healthcare-associated pneumonia)   . Agitation   . Acute encephalopathy   . Bradycardia   . Essential hypertension   . Abdominal distention   . Encounter for imaging study to confirm orogastric (OG) tube placement   . Acute respiratory failure with hypoxia ([place])   . Encounter for central line placement   . Ventricular fibrillation ([facility]) July 01, 2017  . Sudden cardiac death ([facility]) 2017-07-01  . ST elevation myocardial infarction (STEMI) ([place])     [place] ,[place], [place]  08/02/2017, 5:17 PM  [facility] [address] [place], [place], [address] Phone: [phone]   Fax:  [phone]   Name: Jeffery Thomas MRN: [id number] Date of Birth: 1951-01-25

## 2017-08-02 NOTE — Patient Instructions (Signed)
  Please complete the assigned speech therapy homework prior to your next session and return it to the speech therapist at your next visit.  

## 2017-08-02 NOTE — Therapy (Signed)
[facility] [address] [place], [place], [address] Phone: [phone]   Fax:  [phone]  Occupational Therapy Treatment  Patient Details  Name: Jeffery Thomas MRN: [id number] Date of Birth: 03-10-1951 Referring Provider: Dr. Donetta Potts  Encounter Date: 08/02/2017      OT End of Session - 08/02/17 1402    Visit Number 5   Number of Visits 8   Date for OT Re-Evaluation 08/17/17   Authorization Type [employer]    Authorization - Visit Number 5   Authorization - Number of Visits 10   OT Start Time 8850   OT Stop Time 1345   OT Time Calculation (min) 30 min   Activity Tolerance Patient tolerated treatment well      Past Medical History:  Diagnosis Date  . Hypertension     Past Surgical History:  Procedure Laterality Date  . ICD IMPLANT N/A 07/04/2017   Procedure: ICD Implant;  Surgeon: Thompson Grayer, MD;  Location: [facility];  Service: Cardiovascular;  Laterality: N/A;  . LEFT HEART CATH AND CORONARY ANGIOGRAPHY N/A 06/26/2017   Procedure: LEFT HEART CATH AND CORONARY ANGIOGRAPHY;  Surgeon: Lorretta Harp, MD;  Location: [facility];  Service: Cardiovascular;  Laterality: N/A;    There were no vitals filed for this visit.      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 No/denies            [facility] OT Assessment - 08/02/17 0001      Hand Function   Left Hand Grip (lbs) 110 lbs                  OT Treatments/Exercises (OP) - 08/02/17 0001      Cognitive Exercises   Financial Management Balancing Checkbook Pt asked to do another financial management task - pt forgot one debit and deposit and asked to correct prior to therapist checking work. Pt also put debits in deposit column. Pt then performed with 1 small error in transcription. Recommended supervision for task d/t min errors and omitting items.      Visual/Perceptual Exercises   Scanning - Environmental Environmental scanning while performing physical task for divided attention, visual scanning, and coordination. Pt found 9/12 items on 1st trial (75% accuracy) then found 2/3 remaining items on 2nd trial.                 OT Education - 08/02/17 1401    Education provided Yes   Education Details review of goals and progress towards goals, review of recommendations including supervision for financial management   Person(s) Educated Patient   Methods Explanation   Comprehension Verbalized understanding             OT Long Term Goals - 08/02/17 1320      OT LONG TERM GOAL #1   Title Pt to demo cooking task safely with external cues for safety/reminders   Time 4   Period Weeks   Status Deferred  per pt request  OT LONG TERM GOAL #2   Title Pt to perform divided attention task b/t physical and visual scanning task in prep for future driving   Time 4   Period Weeks   Status Partially Met  Pt able to do, but only 75% accurate     OT LONG TERM GOAL #3   Title Pt to perform financial management task at 90% accuracy with external aids prn   Time 4   Period Weeks   Status Achieved  07/31/17: met in clinic with calculator. Still recommend supervision for accuracy     OT LONG TERM GOAL #4   Title Improve Lt grip strength by 5 lbs    Baseline eval = 77 lbs (Rt = 92 lbs)    Time 4   Period Weeks   Status Achieved  08/02/17: Lt = 110 lbs               Plan - 08/02/17 1403    Clinical Impression Statement Pt has partially met goals. Pt still  required supervision for accuracy on cognitive tasks    OT Treatment/Interventions Self-care/ADL training;DME and/or AE instruction;Patient/family education;Therapeutic exercises;Therapeutic activities;Functional Mobility Training;Cognitive remediation/compensation;Energy conservation   Plan D/C O.T. Pt to continue with speech therapy for memory deficits   Consulted and Agree with Plan of Care Patient      Patient will benefit from skilled therapeutic intervention in order to improve the following deficits and impairments:  Decreased activity tolerance, Decreased cognition, Decreased strength, Impaired perceived functional ability  Visit Diagnosis: Frontal lobe and executive function deficit    Problem List Patient Active Problem List   Diagnosis Date Noted  . Benign essential HTN   . History of cardiac arrest   . Streptococcal sore throat   . Anoxic-ischemic encephalopathy ([place]) 07/05/2017  . Cardiac device in situ   . Gastroesophageal reflux disease   . Acute systolic congestive heart failure ([place])   . Takatsuki syndrome   . Leukocytosis   . Prediabetes   . HCAP (healthcare-associated pneumonia)   . Agitation   . Acute encephalopathy   . Bradycardia   . Essential hypertension   . Abdominal distention   . Encounter for imaging study to confirm orogastric (OG) tube placement   . Acute respiratory failure with hypoxia ([place])   . Encounter for central line placement   . Ventricular fibrillation ([place]) 2017/07/06  . Sudden cardiac death ([facility]) 07/06/17  . ST elevation myocardial infarction (STEMI) ([facility])     OCCUPATIONAL THERAPY DISCHARGE SUMMARY  Visits from Start of Care: 5  Current functional level related to goals / functional outcomes: See above   Remaining deficits: Cognition including memory, divided attention tasks, executive functioning   Education / Equipment: HEP's, recommendations   Plan: Patient agrees to discharge.  Patient goals were partially met.  Patient is being discharged due to meeting the stated rehab goals.  ?????        Carey Bullocks, OTR/L 08/02/2017, 2:04 PM  [facility] [address] [place], [place], [address] Phone: [phone]   Fax:  [phone]  Name: Jeffery Thomas MRN: [id number] Date of Birth: July 30, 1951

## 2017-08-04 ENCOUNTER — Ambulatory Visit

## 2017-08-04 DIAGNOSIS — R41841 Cognitive communication deficit: Secondary | ICD-10-CM

## 2017-08-04 DIAGNOSIS — R2681 Unsteadiness on feet: Secondary | ICD-10-CM | POA: Diagnosis not present

## 2017-08-04 NOTE — Therapy (Signed)
[facility] [address] [place], [place], [address] Phone: [phone]   Fax:  [phone]  Speech Language Pathology Treatment  Patient Details  Name: Jeffery Thomas MRN: [id number] Date of Birth: Feb 02, 1951 Referring Provider: Oval Linsey, MD  Encounter Date: 08/04/2017      End of Session - 08/04/17 0913    Visit Number 5   Number of Visits 17   Date for SLP Re-Evaluation 09/22/17   SLP Start Time 0803   SLP Stop Time  0845   SLP Time Calculation (min) 42 min   Activity Tolerance Patient tolerated treatment well      Past Medical History:  Diagnosis Date  . Hypertension     Past Surgical History:  Procedure Laterality Date  . ICD IMPLANT N/A 07/04/2017   Procedure: ICD Implant;  Surgeon: Thompson Grayer, MD;  Location: [facility];  Service: Cardiovascular;  Laterality: N/A;  . LEFT HEART CATH AND CORONARY ANGIOGRAPHY N/A 06/26/2017   Procedure: LEFT HEART CATH AND CORONARY ANGIOGRAPHY;  Surgeon: Lorretta Harp, MD;  Location: [facility];  Service: Cardiovascular;  Laterality: N/A;    There were no vitals filed for this visit.      Subjective Assessment - 08/04/17 0810    Subjective [place] I'm helping my brother sit at his house."               ADULT SLP TREATMENT - 08/04/17 0811      General Information   Behavior/Cognition Alert;Cooperative;Pleasant mood     Treatment Provided   Treatment provided Cognitive-Linquistic     Cognitive-Linquistic Treatment   Treatment focused on Cognition   Skilled Treatment "I made those check marks (with the homework) and it helped a lot." SLP engaged pt with a detailed tasks today and pt demo'd min decr'd attention to detail - missed groceries in car with 90 degrees outside (60 minutes until home, after shopping). SLP discussed with pt need to review tasks to ensure accuracy. Pt will be  sitting at his brother's home and pt will not be scheduing anyone to come just housesitting while people are in the house. Pt had appointment reminder for management with notations made for appointments he needed to cancel.     Assessment / Recommendations / Plan   Plan Continue with current plan of care     Progression Toward Goals   Progression toward goals Progressing toward goals          SLP Education - 08/04/17 0913    Education provided Yes   Education Details good to do homework in a divided attention manner   Person(s) Educated Patient   Methods Explanation   Comprehension Verbalized understanding          SLP Short Term Goals - 08/04/17 0914      SLP SHORT TERM GOAL #1   Title pt will tell SLP 4 memory strategies with modified independence   Status Achieved     SLP SHORT TERM GOAL #2   Title pt will use at least one memory strategy in 4 sessions    Baseline 07/28/17   Time 3   Period Weeks   Status On-going     SLP SHORT TERM GOAL #3   Title pt will report <50% assistance from wife for med manangement   Baseline filled independently, accurately 07/28/17   Time 3   Period Weeks  Status On-going     SLP SHORT TERM GOAL #4   Title pt will functionally use a reminder system for appointments with rare min A   Status Achieved          SLP Long Term Goals - 08/04/17 0915      SLP LONG TERM GOAL #1   Title pt will functionally use a reminder system for appointments over two weeks   Status Achieved     SLP LONG TERM GOAL #2   Title pt will use at least one memory strategy in 8 (total) therapy sessions   Baseline 07/28/17, 08-04-17   Time 7   Period Weeks   Status On-going     SLP LONG TERM GOAL #3   Title pt will manage meds with modified independence over two weeks   Baseline 07/28/17   Time 7   Period Weeks   Status On-going     SLP LONG TERM GOAL #4   Title pt will use a memory compensation system of his choosing successfully without cues for  two weeks   Baseline 08-04-17   Time 7   Period Weeks   Status On-going          Plan - 08/04/17 0914    Clinical Impression Statement Pt improvement in recall and organization noted, however difficulty today with detailed tasks, as emergent awareness was challenging until pt double checked answers. Continued ST intervention is recommended to maximize high level cognition for independence and safety.   Speech Therapy Frequency 2x / week   Duration --  8 weeks/17 visits   Treatment/Interventions Compensatory strategies;Functional tasks;Cueing hierarchy;Patient/family education;Cognitive reorganization;Internal/external aids;SLP instruction and feedback   Potential to Achieve Goals Good   Potential Considerations Ability to learn/carryover information;Family/community support;Cooperation/participation level;Severity of impairments;Previous level of function   SLP Home Exercise Plan provided, reviewed      Patient will benefit from skilled therapeutic intervention in order to improve the following deficits and impairments:   Cognitive communication deficit    Problem List Patient Active Problem List   Diagnosis Date Noted  . Benign essential HTN   . History of cardiac arrest   . Streptococcal sore throat   . Anoxic-ischemic encephalopathy ([place]) 07/05/2017  . Cardiac device in situ   . Gastroesophageal reflux disease   . Acute systolic congestive heart failure ([facility])   . Takatsuki syndrome   . Leukocytosis   . Prediabetes   . HCAP (healthcare-associated pneumonia)   . Agitation   . Acute encephalopathy   . Bradycardia   . Essential hypertension   . Abdominal distention   . Encounter for imaging study to confirm orogastric (OG) tube placement   . Acute respiratory failure with hypoxia ([place])   . Encounter for central line placement   . Ventricular fibrillation ([place]) 07-23-2017  . Sudden cardiac death ([place]) 07/23/2017  . ST elevation myocardial infarction (STEMI) ([facility])      [place] ,[place], [place]  08/04/2017, 9:17 AM  [facility] [address] [place], [place], [address] Phone: [phone]   Fax:  [phone]   Name: Jeffery Thomas MRN: [id number] Date of Birth: 01/12/1951

## 2017-08-04 NOTE — Patient Instructions (Signed)
  Please complete the assigned speech therapy homework prior to your next session and return it to the speech therapist at your next visit.  

## 2017-08-08 ENCOUNTER — Ambulatory Visit (INDEPENDENT_AMBULATORY_CARE_PROVIDER_SITE_OTHER): Admitting: Pharmacist Clinician (PhC)/ Clinical Pharmacy Specialist

## 2017-08-08 ENCOUNTER — Encounter

## 2017-08-08 ENCOUNTER — Encounter: Payer: Self-pay | Admitting: Pharmacist Clinician (PhC)/ Clinical Pharmacy Specialist

## 2017-08-08 VITALS — BP 160/94 | HR 64

## 2017-08-08 DIAGNOSIS — I10 Essential (primary) hypertension: Secondary | ICD-10-CM

## 2017-08-08 DIAGNOSIS — I5021 Acute systolic (congestive) heart failure: Secondary | ICD-10-CM

## 2017-08-08 LAB — HEPATIC FUNCTION PANEL
ALT: 20 IU/L (ref 0–44)
AST: 13 IU/L (ref 0–40)
Albumin: 4.5 g/dL (ref 3.6–4.8)
Alkaline Phosphatase: 76 IU/L (ref 39–117)
BILIRUBIN TOTAL: 0.5 mg/dL (ref 0.0–1.2)
BILIRUBIN, DIRECT: 0.1 mg/dL (ref 0.00–0.40)
TOTAL PROTEIN: 6.9 g/dL (ref 6.0–8.5)

## 2017-08-08 LAB — BASIC METABOLIC PANEL
BUN/Creatinine Ratio: 16 (ref 10–24)
BUN: 14 mg/dL (ref 8–27)
CALCIUM: 9.7 mg/dL (ref 8.6–10.2)
CHLORIDE: 100 mmol/L (ref 96–106)
CO2: 25 mmol/L (ref 20–29)
CREATININE: 0.89 mg/dL (ref 0.76–1.27)
GFR calc non Af Amer: 90 mL/min/{1.73_m2} (ref >59)
GFR, EST AFRICAN AMERICAN: 104 mL/min/{1.73_m2} (ref >59)
GLUCOSE: 100 mg/dL — AB (ref 65–99)
POTASSIUM: 4.7 mmol/L (ref 3.5–5.2)
SODIUM: 141 mmol/L (ref 134–144)

## 2017-08-08 LAB — LIPID PANEL
CHOL/HDL RATIO: 5.1 ratio — AB (ref 0.0–5.0)
Cholesterol, Total: 241 mg/dL — ABNORMAL HIGH (ref 100–199)
HDL: 47 mg/dL (ref >39)
LDL CALC: 163 mg/dL — AB (ref 0–99)
Triglycerides: 154 mg/dL — ABNORMAL HIGH (ref 0–149)
VLDL Cholesterol Cal: 31 mg/dL (ref 5–40)

## 2017-08-08 MED ORDER — CARVEDILOL 12.5 MG PO TABS: 12.5000 mg | ORAL_TABLET | Freq: Two times a day (BID) | ORAL | 5 refills | Status: DC

## 2017-08-08 MED ORDER — SPIRONOLACTONE 25 MG PO TABS: 12.5000 mg | ORAL_TABLET | Freq: Two times a day (BID) | ORAL | 5 refills | Status: DC

## 2017-08-08 MED ORDER — LOSARTAN POTASSIUM 50 MG PO TABS: 50.0000 mg | ORAL_TABLET | Freq: Every day | ORAL | 5 refills | Status: DC

## 2017-08-08 NOTE — Assessment & Plan Note (Signed)
Patient with CHF on carvedilol 6.25 mg bid as well as spironolactone 12.5 mg bid.  Patient without complaints or medication side effects.  BP elevated in the office today.  Will increase carvedilol to 12.5 mg twice daily and have him return in 3 weeks for follow up.  Hopefully can titrate up to 25 mg dose then adjust spironolactone and/or losartan to further control BP.   He will get BMET today to verify potassium level, as well as lipid/liver function tests.

## 2017-08-08 NOTE — Patient Instructions (Signed)
Return for a a follow up appointment in 3 weeks  Your blood pressure today is 160/94  Check your blood pressure at home daily and keep record of the readings.  Take your BP meds as follows:  Increase carvedilol to 12.5 mg twice daily  Continue with all other medications  Bring all of your meds, your BP cuff and your record of home blood pressures to your next appointment.  Exercise as you're able, try to walk approximately 30 minutes per day.  Keep salt intake to a minimum, especially watch canned and prepared boxed foods.  Eat more fresh fruits and vegetables and fewer canned items.  Avoid eating in fast food restaurants.    HOW TO TAKE YOUR BLOOD PRESSURE: . Rest 5 minutes before taking your blood pressure. .  Don't smoke or drink caffeinated beverages for at least 30 minutes before. . Take your blood pressure before (not after) you eat. . Sit comfortably with your back supported and both feet on the floor (don't cross your legs). . Elevate your arm to heart level on a table or a desk. . Use the proper sized cuff. It should fit smoothly and snugly around your bare upper arm. There should be enough room to slip a fingertip under the cuff. The bottom edge of the cuff should be 1 inch above the crease of the elbow. . Ideally, take 3 measurements at one sitting and record the average.

## 2017-08-08 NOTE — Progress Notes (Signed)
     08/08/2017 Ricard Faulkner Altergott Dec 29, 1950 [id number]   HPI:  XXAVIER Thomas is a 66 y.o. male patient of Dr Gwenlyn Found, with a PMH below who presents today for heart failure medication titration.   His medical history is significant for a witnessed cardiac death in 07/20/23 of this year.  After CPR and 3 shocks from defibrillator he was taken to [facility], where an urgent heart cath showed normal coronary arteries, but severe LV dysfunction and Takatsubo syndrome.  An ICD was implanted before discharge.    Blood Pressure Goal:  130/80  Current Medications:  Carvedilol 6.25 mg bid  Losartan 50 mg qd am  Spironolactone 12.5 mg bid  Family Hx:  Mother had MI at 73 (deceased at that time)  No other significant family problems  Social Hx:  Cigars 1 with golf; currently 1-2 vodka during the week; was drinking daily previous to MI;  2 coffee/day; no soda, some green tea  Diet:  Mostly home cooked, no added salt; not heavy on white foods or vegetables; mostly red meat eater Exercise:  Walk 2 miles daily, 100 push ups daily, hits golf balls most days  (golfs 6 d/wk, walks 6 miles on golf days)  Home BP readings:  HTN since 1983; mostly well controlled  Intolerances:   nkda  Labs:  07/12/17:  Na 139, K 4.4, Glucose 101, BUN 13, SCr 1.0, GFR > 60  Wt Readings from Last 3 Encounters:  07/25/17 192 lb 12.8 oz (87.5 kg)  2017/07/19 188 lb 4.4 oz (85.4 kg)  07/03/17 187 lb 4.8 oz (85 kg)   BP Readings from Last 3 Encounters:  08/08/17 (!) 160/94  07/12/17 134/80  07/19/17 130/78   Pulse Readings from Last 3 Encounters:  08/08/17 64  07/12/17 65  Jul 19, 2017 69    Current Outpatient Prescriptions  Medication Sig Dispense Refill  . carvedilol (COREG) 6.25 MG tablet Take 1 tablet (6.25 mg total) by mouth 2 (two) times daily with a meal. 60 tablet 0  . levocetirizine (XYZAL) 5 MG tablet Take 5 mg by mouth every morning.     Marland Kitchen losartan (COZAAR) 50 MG tablet Take 1 tablet (50 mg total) by mouth  daily. 30 tablet 0  . montelukast (SINGULAIR) 10 MG tablet Take 10 mg by mouth every morning.     Marland Kitchen omeprazole (PRILOSEC) 20 MG capsule Take 40 mg by mouth daily.    Marland Kitchen spironolactone (ALDACTONE) 25 MG tablet Take 0.5 tablets (12.5 mg total) by mouth 2 (two) times daily. 30 tablet 0   No current facility-administered medications for this visit.     No Known Allergies  Past Medical History:  Diagnosis Date  . Hypertension     Blood pressure (!) 160/94, pulse 64.  Right arm 156/98, standing 361/44  Acute systolic congestive heart failure ([place]) Patient with CHF on carvedilol 6.25 mg bid as well as spironolactone 12.5 mg bid.  Patient without complaints or medication side effects.  BP elevated in the office today.  Will increase carvedilol to 12.5 mg twice daily and have him return in 3 weeks for follow up.  Hopefully can titrate up to 25 mg dose then adjust spironolactone and/or losartan to further control BP.   He will get BMET today to verify potassium level, as well as lipid/liver function tests.     [name] PharmD CPP [facility] [address] [place], [place] [address] [phone]

## 2017-08-11 ENCOUNTER — Encounter

## 2017-08-14 ENCOUNTER — Encounter

## 2017-08-14 ENCOUNTER — Encounter: Payer: Self-pay | Admitting: Physical Medicine & Rehabilitation

## 2017-08-14 ENCOUNTER — Encounter: Attending: Physical Medicine & Rehabilitation | Admitting: Physical Medicine & Rehabilitation

## 2017-08-14 VITALS — BP 173/109 | HR 65

## 2017-08-14 DIAGNOSIS — I469 Cardiac arrest, cause unspecified: Secondary | ICD-10-CM | POA: Insufficient documentation

## 2017-08-14 DIAGNOSIS — Z95810 Presence of automatic (implantable) cardiac defibrillator: Secondary | ICD-10-CM | POA: Diagnosis not present

## 2017-08-14 DIAGNOSIS — G931 Anoxic brain damage, not elsewhere classified: Secondary | ICD-10-CM | POA: Insufficient documentation

## 2017-08-14 DIAGNOSIS — I6782 Cerebral ischemia: Secondary | ICD-10-CM | POA: Insufficient documentation

## 2017-08-14 DIAGNOSIS — I10 Essential (primary) hypertension: Secondary | ICD-10-CM | POA: Diagnosis not present

## 2017-08-14 NOTE — Progress Notes (Signed)
Subjective:    Patient ID: Jeffery Thomas, male    DOB: April 23, 1951, 66 y.o.   MRN: [id number]  HPI   Jeffery Thomas is here in follow up of his cardiac arrest and anoxic encephalopathy. He is experiencing improved memory. He does not recall events leading up to his cardiac arrest. He continues in outpt SLP but feels he's approaching his baseline. He denies beeing forgetful at home or lacking attention. His day to day memory is good.   His sleep is excellent, mood is good. He hasn't had any issues with exercise and  He is swinging a golf club and anxious about getting back to the golf course. He's a member of a local course and was playing 6 days a week prior to the admission. He has been cleared by cardiology to play. He is walking 5 miles per day and working on other ways to get exercise at home.   He denies pain.   Pain Inventory Average Pain 0 Pain Right Now 0 My pain is na  In the last 24 hours, has pain interfered with the following? General activity 0 Relation with others 0 Enjoyment of life 0 What TIME of day is your pain at its worst? na Sleep (in general) Good  Pain is worse with: na Pain improves with: na Relief from Meds: na  Mobility walk without assistance  Function retired  Neuro/Psych No problems in this area  Prior Studies Any changes since last visit?  no  Physicians involved in your care Any changes since last visit?  no   Family History  Problem Relation Age of Onset  . Hypertension Mother   . Diabetes Father   . Cancer Brother        pancreatic   Social History   Social History  . Marital status: Unknown    Spouse name: N/A  . Number of children: N/A  . Years of education: N/A   Social History Main Topics  . Smoking status: Never Smoker  . Smokeless tobacco: Never Used  . Alcohol use Not on file  . Drug use: Unknown  . Sexual activity: Not on file   Other Topics Concern  . Not on file   Social History Narrative  . No  narrative on file   Past Surgical History:  Procedure Laterality Date  . ICD IMPLANT N/A 07/04/2017   Procedure: ICD Implant;  Surgeon: Thompson Grayer, MD;  Location: [facility];  Service: Cardiovascular;  Laterality: N/A;  . LEFT HEART CATH AND CORONARY ANGIOGRAPHY N/A 06/26/2017   Procedure: LEFT HEART CATH AND CORONARY ANGIOGRAPHY;  Surgeon: Lorretta Harp, MD;  Location: [facility];  Service: Cardiovascular;  Laterality: N/A;   Past Medical History:  Diagnosis Date  . Hypertension    There were no vitals taken for this visit.  Opioid Risk Score:   Fall Risk Score:  `1  Depression screen PHQ 2/9  No flowsheet data found.   Review of Systems  Constitutional: Negative.   HENT: Negative.   Eyes: Negative.   Respiratory: Negative.   Cardiovascular: Negative.   Gastrointestinal: Negative.   Endocrine: Negative.   Genitourinary: Negative.   Musculoskeletal: Negative.   Skin: Negative.   Allergic/Immunologic: Negative.   Neurological: Negative.   Hematological: Negative.   Psychiatric/Behavioral: Negative.   All other systems reviewed and are negative.      Objective:   Physical Exam  Constitutional: He appears well-developed and well-nourished. NAD. Head: Normocephalic and atraumatic.  Eyes: EOMI. No discharge.  Cardiovascular: RRR without murmur. No JVD   Respiratory: CTA Bilaterally without wheezes or rales. Normal effort     GI: soft.  Musculoskeletal: normal ROM and strength.    Neurological: He is alert and oriented.  A&Ox3 Motor: 5/5 proximal to distal in all 4's. Good balance in standing. Romberg negative.  Normal insight and awareness. Can perform simple algebraic problems. Could sequence letters without issue. Recalled 3/3 words after 5 minutes. Recalled current events.  Skin: Skin is warm and dry.    Psychiatric: pleasant and cooperativfe .       Assessment & Plan:  Medical Problem List and Plan: 1. Cognitive deficits as well as  deficits in mobility secondary to encephalopathy and debility.              -pt is nearing cogniitve baseline  -continue outpt SLP to completion  -from my standpoint there is nothing new I have to offer.  2 . Witnessed cardiac arrest: NO driving for 6 months per [place] law-   -cardiology following   3. HTN/Takatsubo CM s/p ICD placement: Pacer precautions. On Cozaar and spironolactone.              - bp controlled at present  Follow up prn. 15 minutes of face to face patient care time were spent during this visit. All questions were encouraged and answered.

## 2017-08-14 NOTE — Patient Instructions (Signed)
PLEASE FEEL FREE TO CALL OUR OFFICE WITH ANY PROBLEMS OR QUESTIONS ([phone])      

## 2017-08-16 ENCOUNTER — Other Ambulatory Visit: Payer: Self-pay | Admitting: Cardiovascular Disease

## 2017-08-16 ENCOUNTER — Encounter

## 2017-08-16 DIAGNOSIS — E785 Hyperlipidemia, unspecified: Secondary | ICD-10-CM

## 2017-08-16 MED ORDER — ATORVASTATIN CALCIUM 20 MG PO TABS: 20.0000 mg | ORAL_TABLET | Freq: Every day | ORAL | 3 refills | Status: DC

## 2017-08-22 ENCOUNTER — Encounter

## 2017-08-24 ENCOUNTER — Encounter

## 2017-08-28 ENCOUNTER — Encounter

## 2017-08-31 ENCOUNTER — Ambulatory Visit

## 2017-08-31 ENCOUNTER — Encounter

## 2017-09-15 ENCOUNTER — Ambulatory Visit (INDEPENDENT_AMBULATORY_CARE_PROVIDER_SITE_OTHER): Admitting: Pharmacist

## 2017-09-15 ENCOUNTER — Telehealth: Payer: Self-pay | Admitting: Cardiovascular Disease

## 2017-09-15 VITALS — BP 148/100 | HR 62 | Wt 214.0 lb

## 2017-09-15 DIAGNOSIS — I10 Essential (primary) hypertension: Secondary | ICD-10-CM

## 2017-09-15 MED ORDER — SPIRONOLACTONE 25 MG PO TABS: 25.0000 mg | ORAL_TABLET | Freq: Once | ORAL | 5 refills | Status: DC

## 2017-09-15 MED ORDER — SPIRONOLACTONE 25 MG PO TABS: 25.0000 mg | ORAL_TABLET | Freq: Every day | ORAL | 5 refills | Status: DC

## 2017-09-15 MED ORDER — CARVEDILOL 25 MG PO TABS: 25.0000 mg | ORAL_TABLET | Freq: Two times a day (BID) | ORAL | 1 refills | Status: DC

## 2017-09-15 NOTE — Telephone Encounter (Signed)
Per BP check appt from 09/15/17:  Take your BP meds as follows: *Change spironolactone to 25mg  every morning* *INCREASE carvedilol to 25mg  twice daily (morning and bedtime) *Work on lifestyle modification*

## 2017-09-15 NOTE — Telephone Encounter (Signed)
She needs the correct strength of pt's Coreg please.

## 2017-09-15 NOTE — Patient Instructions (Addendum)
Return for a  follow up appointment in 4 weeks with Dr Gwenlyn Found  Your blood pressure today is 148/100 pulse 62  Check your blood pressure at home daily (if able) and keep record of the readings.  Take your BP meds as follows: *Change spironolactone to 25mg  every morning* *INCREASE carvedilol to 25mg  twice daily (morning and bedtime) *Work on lifestyle modification*   Bring your BP cuff and your record of home blood pressures to your next appointment.  Exercise as you're able, try to walk approximately 30 minutes per day.  Keep salt intake to a minimum, especially watch canned and prepared boxed foods.  Eat more fresh fruits and vegetables and fewer canned items.  Avoid eating in fast food restaurants.    HOW TO TAKE YOUR BLOOD PRESSURE: . Rest 5 minutes before taking your blood pressure. .  Don't smoke or drink caffeinated beverages for at least 30 minutes before. . Take your blood pressure before (not after) you eat. . Sit comfortably with your back supported and both feet on the floor (don't cross your legs). . Elevate your arm to heart level on a table or a desk. . Use the proper sized cuff. It should fit smoothly and snugly around your bare upper arm. There should be enough room to slip a fingertip under the cuff. The bottom edge of the cuff should be 1 inch above the crease of the elbow. . Ideally, take 3 measurements at one sitting and record the average.

## 2017-09-15 NOTE — Progress Notes (Signed)
HPI:  Jeffery Thomas is a 66 y.o. male patient of Dr Gwenlyn Found, with a PMH below who presents today for heart failure medication titration.   His medical history is significant for a witnessed cardiac death in 2023/07/18 of this year.  After CPR and 3 shocks from defibrillator he was taken to [facility], where an urgent heart cath showed normal coronary arteries, but severe LV dysfunction and Takatsubo syndrome.  An ICD was implanted before discharge.  During moste recent office visit his carvedilol dose was increased from 6.25mg  twice daily to 12.5mg  twice daily. BMET from 08/08/17 showed K = 4.7 and stable renal function.   Patient presents today accompanied by spouse. Denies chest pain, shortness of breath, dizziness, headaches or swelling. Reports increased fatigue that had improves by taking 2nd carvedilol at bedtime.  Blood Pressure Goal:  130/80  Current Medications:  Carvedilol 12.5 mg twice daily  Losartan 50 mg daily  Spironolactone 12.5 mg twice daily  Family Hx:  Mother had MI at 59 (deceased at that time)  No other significant family problems  Social Hx:  Cigars 1 with golf; continues to drink frequently;  2 coffee/day; no soda, some green tea  Diet:  Mostly home cooked, no added salt; not heavy on white foods or vegetables; mostly red meat eater Exercise:  Walk 2 miles daily, 100 push ups daily, hits golf balls most days  (golfs 6 d/wk, walks 6 miles on golf days)  Home BP readings:  HTN since 1983; mostly well controlled  Wt Readings from Last 3 Encounters:  09/15/17 214 lb (97.1 kg)  07/25/17 192 lb 12.8 oz (87.5 kg)  2017/07/17 188 lb 4.4 oz (85.4 kg)   BP Readings from Last 3 Encounters:  09/15/17 (!) 148/100  08/14/17 (!) 173/109  08/08/17 (!) 160/94   Pulse Readings from Last 3 Encounters:  09/15/17 62  08/14/17 65  08/08/17 64    Current Outpatient Medications  Medication Sig Dispense Refill  . atorvastatin (LIPITOR) 20 MG tablet Take 1 tablet (20 mg total) by  mouth daily. 30 tablet 3  . carvedilol (COREG) 25 MG tablet Take 1 tablet (25 mg total) 2 (two) times daily by mouth. 60 tablet 1  . levocetirizine (XYZAL) 5 MG tablet Take 5 mg by mouth every morning.     Marland Kitchen losartan (COZAAR) 50 MG tablet Take 1 tablet (50 mg total) by mouth daily. 30 tablet 5  . montelukast (SINGULAIR) 10 MG tablet Take 10 mg by mouth every morning.     Marland Kitchen omeprazole (PRILOSEC) 20 MG capsule Take 40 mg by mouth daily.    Marland Kitchen spironolactone (ALDACTONE) 25 MG tablet Take 1 tablet (25 mg total) daily by mouth. 30 tablet 5   No current facility-administered medications for this visit.     No Known Allergies  Past Medical History:  Diagnosis Date  . Hypertension     Blood pressure (!) 148/100, pulse 62, weight 214 lb (97.1 kg), SpO2 94 %.  Right arm 156/98, standing 152/92  Essential hypertension After seating quietly for 2 minutes, BP remains above desired goal of <130/80 during office visit with home BP average reading of 155/96. His diastolic readings are > 90 mmHg most time. Noted, patient continued to drink "hard liquor" regularly, gained ~ 20lbs in 2 months, and still eating significant amount of sodium in diet (avoid adding salt to meals but reports some frozen meals and sandwiches with high sodium cold cuts). He reports an appropriate amount if physical  activity and denies swelling or ADR to current therapy. Significant amount of time was spent talking about reducing alcohol intake, and decrease sodium in diet to 2000mg  per day. Will increase carvedilol to 25mg  twice daily today, continue daily BP monitoring and bring records to next F/U visit in 4 weeks . Plan to increase losartan to 50mg  twice daily if additional BP control needed during next office visit.   Lucciana Head Rodriguez-Guzman PharmD, BCPS, [place] [id number] 09/18/2017 7:46 AM

## 2017-09-15 NOTE — Telephone Encounter (Signed)
Called pharmacist and confirmed that carvedilol 25mg  BID is correct dose

## 2017-09-18 ENCOUNTER — Encounter: Payer: Self-pay | Admitting: Pharmacist

## 2017-09-18 NOTE — Assessment & Plan Note (Signed)
After seating quietly for 2 minutes, BP remains above desired goal of <130/80 during office visit with home BP average reading of 155/96. His diastolic readings are > 90 mmHg most time. Noted, patient continued to drink "hard liquor" regularly, gained ~ 20lbs in 2 months, and still eating significant amount of sodium in diet (avoid adding salt to meals but reports some frozen meals and sandwiches with high sodium cold cuts). He reports an appropriate amount if physical activity and denies swelling or ADR to current therapy. Significant amount of time was spent talking about reducing alcohol intake, and decrease sodium in diet to 2000mg  per day. Will increase carvedilol to 25mg  twice daily today, continue daily BP monitoring and bring records to next F/U visit in 4 weeks . Plan to increase losartan to 50mg  twice daily if additional BP control needed during next office visit.

## 2017-10-09 ENCOUNTER — Ambulatory Visit (INDEPENDENT_AMBULATORY_CARE_PROVIDER_SITE_OTHER): Admitting: Internal Medicine

## 2017-10-09 ENCOUNTER — Ambulatory Visit (HOSPITAL_COMMUNITY): Attending: Internal Medicine

## 2017-10-09 ENCOUNTER — Other Ambulatory Visit: Payer: Self-pay

## 2017-10-09 ENCOUNTER — Encounter: Payer: Self-pay | Admitting: Internal Medicine

## 2017-10-09 VITALS — BP 154/78 | HR 48 | Ht 74.0 in | Wt 217.6 lb

## 2017-10-09 DIAGNOSIS — E785 Hyperlipidemia, unspecified: Secondary | ICD-10-CM | POA: Diagnosis not present

## 2017-10-09 DIAGNOSIS — I4901 Ventricular fibrillation: Secondary | ICD-10-CM

## 2017-10-09 DIAGNOSIS — M25562 Pain in left knee: Secondary | ICD-10-CM

## 2017-10-09 DIAGNOSIS — I10 Essential (primary) hypertension: Secondary | ICD-10-CM | POA: Diagnosis not present

## 2017-10-09 DIAGNOSIS — R0602 Shortness of breath: Secondary | ICD-10-CM | POA: Diagnosis not present

## 2017-10-09 DIAGNOSIS — I5021 Acute systolic (congestive) heart failure: Secondary | ICD-10-CM | POA: Diagnosis not present

## 2017-10-09 DIAGNOSIS — I110 Hypertensive heart disease with heart failure: Secondary | ICD-10-CM | POA: Diagnosis not present

## 2017-10-09 LAB — CUP PACEART INCLINIC DEVICE CHECK
Battery Remaining Longevity: 103 mo
Date Time Interrogation Session: 20181203145924
HighPow Impedance: 72 Ohm
Implantable Pulse Generator Implant Date: 20180828
Lead Channel Impedance Value: 512.5 Ohm
Lead Channel Pacing Threshold Amplitude: 0.5 V
Lead Channel Pacing Threshold Pulse Width: 0.5 ms
Lead Channel Sensing Intrinsic Amplitude: 11.8 mV
Lead Channel Setting Pacing Amplitude: 2.5 V
MDC IDC LEAD IMPLANT DT: 20180828
MDC IDC LEAD LOCATION: 753860
MDC IDC MSMT LEADCHNL RV PACING THRESHOLD AMPLITUDE: 0.5 V
MDC IDC MSMT LEADCHNL RV PACING THRESHOLD PULSEWIDTH: 0.5 ms
MDC IDC PG SERIAL: 7423068
MDC IDC SET LEADCHNL RV PACING PULSEWIDTH: 0.5 ms
MDC IDC SET LEADCHNL RV SENSING SENSITIVITY: 0.5 mV
MDC IDC STAT BRADY RV PERCENT PACED: 0 %

## 2017-10-09 MED ORDER — LOSARTAN POTASSIUM 100 MG PO TABS: 50.0000 mg | ORAL_TABLET | Freq: Every day | ORAL | 3 refills | Status: DC

## 2017-10-09 NOTE — Progress Notes (Signed)
PCP: Bernerd Limbo, MD Primary Cardiologist:  Dr Gwenlyn Found Primary EP: Dr Janace Litten is a 66 y.o. male who presents today for routine electrophysiology followup.  He has made remarkable recovery s/p VF arrest.  His wife feels that his cognition is near baseline.  He is playing golf again.  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  Today, he denies symptoms of palpitations, chest pain, shortness of breath,  lower extremity edema, dizziness, presyncope, syncope, or ICD shocks.  The patient is otherwise without complaint today.   Past Medical History:  Diagnosis Date  . Abdominal distention   . Acute encephalopathy   . Acute respiratory failure with hypoxia ([place])   . Acute systolic congestive heart failure ([place])   . Agitation   . 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 Benign essential HTN   . Bradycardia   . Cardiac device in situ   . Encounter for central line placement   . Encounter for imaging study to confirm orogastric (OG) tube placement   . Gastroesophageal reflux disease   . HCAP (healthcare-associated pneumonia)   . History of cardiac arrest   . Hypertension   . Leukocytosis   . Prediabetes   . ST elevation myocardial infarction (STEMI) ([place])   . Streptococcal sore throat   . Sudden cardiac death [facility]) 07-02-2017   Sudden cardiac death/Takatsubo  syndrome  . Takatsuki syndrome   . Ventricular fibrillation ([place]) July 02, 2017   Past Surgical History:  Procedure Laterality Date  . ICD IMPLANT N/A 07/04/2017   SJM Fortify Assura VR ICD implanted by Dr Rayann Heman for secondary prevention after VF arrest  . LEFT HEART CATH AND CORONARY ANGIOGRAPHY N/A 2017/07/02   Procedure: LEFT HEART CATH AND CORONARY ANGIOGRAPHY;  Surgeon: Lorretta Harp, MD;  Location: [facility] [facility];  Service: Cardiovascular;  Laterality: N/A;    ROS- all systems are reviewed and negative except as per HPI above  Current Outpatient Medications  Medication Sig Dispense Refill  .  atorvastatin (LIPITOR) 20 MG tablet Take 1 tablet (20 mg total) by mouth daily. 30 tablet 3  . carvedilol (COREG) 25 MG tablet Take 1 tablet (25 mg total) 2 (two) times daily by mouth. 60 tablet 1  . levocetirizine (XYZAL) 5 MG tablet Take 5 mg by mouth every morning.     Marland Kitchen losartan (COZAAR) 50 MG tablet Take 1 tablet (50 mg total) by mouth daily. 30 tablet 5  . montelukast (SINGULAIR) 10 MG tablet Take 10 mg by mouth every morning.     Marland Kitchen omeprazole (PRILOSEC) 20 MG capsule Take 20 mg by mouth daily.     Marland Kitchen spironolactone (ALDACTONE) 25 MG tablet Take 1 tablet (25 mg total) daily by mouth. 30 tablet 5   No current facility-administered medications for this visit.     Physical Exam: Vitals:   10/09/17 1134  BP: (!) 154/78  Pulse: (!) 48  SpO2: 99%  Weight: 217 lb 9.6 oz (98.7 kg)  Height: 6\' 2"  (1.88 m)    GEN- The patient is well appearing, alert and oriented x 3 today.   Head- normocephalic, atraumatic Eyes-  Sclera clear, conjunctiva pink Ears- hearing intact Oropharynx- clear Lungs- Clear to ausculation bilaterally, normal work of breathing Chest- ICD pocket is well healed Heart- Regular rate and rhythm, no murmurs, rubs or gallops, PMI not laterally displaced GI- soft, NT, ND, + BS Extremities- no clubbing, cyanosis, or edema  ICD interrogation- reviewed in detail today,  See PACEART report  ekg tracing ordered today is personally reviewed and shows sinus bradycardia 48 bpm, inferior TWI  Assessment and Plan:  1.  S/p VF arrest Normal ICD function See Pace Art report No changes today Driving restriction (6 months post VF arrest) reinforced today   2. takatsubo CM/ nonischemic CM Clinically stable though coreview is elevated Not very volume overloaded by exam 2 gram sodium restriction advised Bmet, BNP today Repeat echo pending (ordered by Dr Gwenlyn Found) Enroll in [facility] device clinic with Sharman Cheek  3. HTN Elevated today Increase losartan to 100mg  daily  today bmet  4. Sinus bradycardia Asymptomatic Histograms actually look pretty good on ICD interrogation No changes  5. L knee DJD S/p prior surgery Wife requests referral to Ortho which I will place today  Follow-up with Dr Gwenlyn Found as scheduled Enroll in [facility] device clinic [place] Return to see EP NP in a year  Thompson Grayer MD, [facility] 10/09/2017 12:10 PM

## 2017-10-09 NOTE — Patient Instructions (Addendum)
Medication Instructions:  Your physician has recommended you make the following change in your medication:  1) Increase Losartan to 100 mg daily   Labwork: Your physician recommends that you return for lab work today: BMP/BNP   Testing/Procedures: None ordered   Follow-Up: Your physician wants you to follow-up in: 12 months with Chanetta Marshall, NP You will receive a reminder letter in the mail two months in advance. If you don't receive a letter, please call our office to schedule the follow-up appointment.  Remote monitoring is used to monitor your  ICD from home. This monitoring reduces the number of office visits required to check your device to one time per year. It allows [place] to keep an eye on the functioning of your device to ensure it is working properly. You are scheduled for a device check from home on 01/08/18. You may send your transmission at any time that day. If you have a wireless device, the transmission will be sent automatically. After your physician reviews your transmission, you will receive a postcard with your next transmission date.  You will receive a call from Sharman Cheek, RN in regards to [facility] clinic  You have been referred to Dr Leona Singleton for L knee DJD       Any Other Special Instructions Will Be Listed Below (If Applicable).   Low-Sodium Eating Plan Sodium, which is an element that makes up salt, helps you maintain a healthy balance of fluids in your body. Too much sodium can increase your blood pressure and cause fluid and waste to be held in your body. Your health care provider or dietitian may recommend following this plan if you have high blood pressure (hypertension), kidney disease, liver disease, or heart failure. Eating less sodium can help lower your blood pressure, reduce swelling, and protect your heart, liver, and kidneys. What are tips for following this plan? General guidelines  Most people on this plan should limit their sodium intake to  2,000 mg (milligrams) of sodium each day. Reading food labels  The Nutrition Facts label lists the amount of sodium in one serving of the food. If you eat more than one serving, you must multiply the listed amount of sodium by the number of servings.  Choose foods with less than 140 mg of sodium per serving.  Avoid foods with 300 mg of sodium or more per serving. Shopping  Look for lower-sodium products, often labeled as "low-sodium" or "no salt added."  Always check the sodium content even if foods are labeled as "unsalted" or "no salt added".  Buy fresh foods. ? Avoid canned foods and premade or frozen meals. ? Avoid canned, cured, or processed meats  Buy breads that have less than 80 mg of sodium per slice. Cooking  Eat more home-cooked food and less restaurant, buffet, and fast food.  Avoid adding salt when cooking. Use salt-free seasonings or herbs instead of table salt or sea salt. Check with your health care provider or pharmacist before using salt substitutes.  Cook with plant-based oils, such as canola, sunflower, or olive oil. Meal planning  When eating at a restaurant, ask that your food be prepared with less salt or no salt, if possible.  Avoid foods that contain MSG (monosodium glutamate). MSG is sometimes added to [place] food, bouillon, and some canned foods. What foods are recommended? The items listed may not be a complete list. Talk with your dietitian about what dietary choices are best for you. Grains Low-sodium cereals, including oats, puffed wheat and  rice, and shredded wheat. Low-sodium crackers. Unsalted rice. Unsalted pasta. Low-sodium bread. Whole-grain breads and whole-grain pasta. Vegetables Fresh or frozen vegetables. "No salt added" canned vegetables. "No salt added" tomato sauce and paste. Low-sodium or reduced-sodium tomato and vegetable juice. Fruits Fresh, frozen, or canned fruit. Fruit juice. Meats and other protein foods Fresh or frozen (no  salt added) meat, poultry, seafood, and fish. Low-sodium canned tuna and salmon. Unsalted nuts. Dried peas, beans, and lentils without added salt. Unsalted canned beans. Eggs. Unsalted nut butters. Dairy Milk. Soy milk. Cheese that is naturally low in sodium, such as ricotta cheese, fresh mozzarella, or Swiss cheese Low-sodium or reduced-sodium cheese. Cream cheese. Yogurt. Fats and oils Unsalted butter. Unsalted margarine with no trans fat. Vegetable oils such as canola or olive oils. Seasonings and other foods Fresh and dried herbs and spices. Salt-free seasonings. Low-sodium mustard and ketchup. Sodium-free salad dressing. Sodium-free light mayonnaise. Fresh or refrigerated horseradish. Lemon juice. Vinegar. Homemade, reduced-sodium, or low-sodium soups. Unsalted popcorn and pretzels. Low-salt or salt-free chips. What foods are not recommended? The items listed may not be a complete list. Talk with your dietitian about what dietary choices are best for you. Grains Instant hot cereals. Bread stuffing, pancake, and biscuit mixes. Croutons. Seasoned rice or pasta mixes. Noodle soup cups. Boxed or frozen macaroni and cheese. Regular salted crackers. Self-rising flour. Vegetables Sauerkraut, pickled vegetables, and relishes. Olives. [place] fries. Onion rings. Regular canned vegetables (not low-sodium or reduced-sodium). Regular canned tomato sauce and paste (not low-sodium or reduced-sodium). Regular tomato and vegetable juice (not low-sodium or reduced-sodium). Frozen vegetables in sauces. Meats and other protein foods Meat or fish that is salted, canned, smoked, spiced, or pickled. Bacon, ham, sausage, hotdogs, corned beef, chipped beef, packaged lunch meats, salt pork, jerky, pickled herring, anchovies, regular canned tuna, sardines, salted nuts. Dairy Processed cheese and cheese spreads. Cheese curds. Blue cheese. Feta cheese. String cheese. Regular cottage cheese. Buttermilk. Canned milk. Fats  and oils Salted butter. Regular margarine. Ghee. Bacon fat. Seasonings and other foods Onion salt, garlic salt, seasoned salt, table salt, and sea salt. Canned and packaged gravies. Worcestershire sauce. Tartar sauce. Barbecue sauce. Teriyaki sauce. Soy sauce, including reduced-sodium. Steak sauce. Fish sauce. Oyster sauce. Cocktail sauce. Horseradish that you find on the shelf. Regular ketchup and mustard. Meat flavorings and tenderizers. Bouillon cubes. Hot sauce and Tabasco sauce. Premade or packaged marinades. Premade or packaged taco seasonings. Relishes. Regular salad dressings. Salsa. Potato and tortilla chips. Corn chips and puffs. Salted popcorn and pretzels. Canned or dried soups. Pizza. Frozen entrees and pot pies. Summary  Eating less sodium can help lower your blood pressure, reduce swelling, and protect your heart, liver, and kidneys.  Most people on this plan should limit their sodium intake to 1,500-2,000 mg (milligrams) of sodium each day.  Canned, boxed, and frozen foods are high in sodium. Restaurant foods, fast foods, and pizza are also very high in sodium. You also get sodium by adding salt to food.  Try to cook at home, eat more fresh fruits and vegetables, and eat less fast food, canned, processed, or prepared foods. This information is not intended to replace advice given to you by your health care provider. Make sure you discuss any questions you have with your health care provider. Document Released: 04/15/2002 Document Revised: 10/17/2016 Document Reviewed: 10/17/2016 Elsevier Interactive Patient Education  2017 [employer].      If you need a refill on your cardiac medications before your next appointment, please call your pharmacy.

## 2017-10-10 LAB — BASIC METABOLIC PANEL
BUN / CREAT RATIO: 13 (ref 10–24)
BUN: 13 mg/dL (ref 8–27)
CALCIUM: 8.9 mg/dL (ref 8.6–10.2)
CHLORIDE: 102 mmol/L (ref 96–106)
CO2: 26 mmol/L (ref 20–29)
Creatinine, Ser: 1.03 mg/dL (ref 0.76–1.27)
GFR calc Af Amer: 88 mL/min/{1.73_m2} (ref >59)
GFR calc non Af Amer: 76 mL/min/{1.73_m2} (ref >59)
GLUCOSE: 106 mg/dL — AB (ref 65–99)
Potassium: 4.6 mmol/L (ref 3.5–5.2)
Sodium: 142 mmol/L (ref 134–144)

## 2017-10-10 LAB — PRO B NATRIURETIC PEPTIDE: NT-PRO BNP: 168 pg/mL (ref 0–210)

## 2017-10-11 ENCOUNTER — Other Ambulatory Visit: Payer: Self-pay | Admitting: *Deleted

## 2017-10-11 MED ORDER — LOSARTAN POTASSIUM 100 MG PO TABS: 100.0000 mg | ORAL_TABLET | Freq: Every day | ORAL | 3 refills | Status: DC

## 2017-10-11 NOTE — Telephone Encounter (Signed)
Received a fax with from the pharmacy with a note that patient thought that dose was supposed to be increased to 100 mg qd but the rx was sent in for 50 mg qd. Per 10/09/17 office visit:  3. HTN Elevated today Increase losartan to 100mg  daily today bmet  Patient Instructions by Dionicio Stall, RN at 10/09/2017 12:00 PM   Author: Dionicio Stall, RN Author Type: Registered Nurse Filed: 10/09/2017 12:20 PM  Note Status: Addendum Jeffery Thomas: Cosign Not Required Encounter Date: 10/09/2017  Editor: Dionicio Stall, RN (Registered Nurse)  Prior Versions: 1. Dionicio Stall, RN (Registered Nurse) at 10/09/2017 12:18 PM - Signed    Medication Instructions:  Your physician has recommended you make the following change in your medication:  1) Increase Losartan to 100 mg daily     Will send in updated rx.

## 2017-10-12 ENCOUNTER — Telehealth: Payer: Self-pay

## 2017-10-12 NOTE — Telephone Encounter (Signed)
Referred to ICM clinic by Dr Rayann Heman.  Attempted ICM intro call to patient and left message for return call.   ICM remote transmission scheduled for 11/16/17 with intro.

## 2017-10-12 NOTE — Telephone Encounter (Signed)
Patient returned call and agreed to monthly ICM calls.  He has monitor by bedside.  Advised will schedule 1st remote for 11/16/2016 and if he has any fluid symptoms prior to that date to call back.  He has direct ICM number.

## 2017-10-24 ENCOUNTER — Ambulatory Visit (INDEPENDENT_AMBULATORY_CARE_PROVIDER_SITE_OTHER): Admitting: Cardiovascular Disease

## 2017-10-24 ENCOUNTER — Encounter: Payer: Self-pay | Admitting: Cardiovascular Disease

## 2017-10-24 VITALS — BP 124/78 | HR 57 | Ht 72.0 in | Wt 215.4 lb

## 2017-10-24 DIAGNOSIS — E785 Hyperlipidemia, unspecified: Secondary | ICD-10-CM | POA: Insufficient documentation

## 2017-10-24 DIAGNOSIS — I10 Essential (primary) hypertension: Secondary | ICD-10-CM

## 2017-10-24 DIAGNOSIS — I469 Cardiac arrest, cause unspecified: Secondary | ICD-10-CM | POA: Diagnosis not present

## 2017-10-24 DIAGNOSIS — E7800 Pure hypercholesterolemia, unspecified: Secondary | ICD-10-CM

## 2017-10-24 DIAGNOSIS — Z13220 Encounter for screening for lipoid disorders: Secondary | ICD-10-CM

## 2017-10-24 NOTE — Progress Notes (Signed)
10/24/2017 Jeffery Thomas   July 02, 1951  [id number]  Primary Physician Bernerd Limbo, MD Primary Cardiologist: Lorretta Harp MD FACP, [place], [place], [place]  HPI:  Jeffery Thomas is a 66 y.o.  married, father of one with no grandchildren who has not worked the last 43 years and is accompanied by his wife Jeffery Thomas who works for the [employer] raising. I last saw him in the office 07/25/17. He had witnessed cardiac death on the evening of 07-10-17. He had fairly immediate CPR after the EMS was called and was shocked 3 times. He had CPR and had R OSC in 20 minutes. He is transported to [facility] where I performed urgent cardiac catheterization revealing normal coronary arteries and severe LV dysfunction consistent with Takatsubo syndrome. 8 days later he had an ICD implanted by Dr. Rayann Heman for secondary prevention. He was discharged home from the hospital 3 weeks ago. Prior to his presentation he did have a history of treated hypertension and hyperlipidemia. His mother died of a myocardial infarction at age 39. He does smoke cigarettes or playing golf and prior to this drank several hard drinks of liquor a night. Since that time his drinking and has been markedly limited. He has no recollection of the event or one week prior to the event. He has no neurologic deficits otherwise. Since I saw him back in September he has had an ICD implanted by Dr. Rayann Heman for secondary prevention. Recent 2-D echo performed 10/09/17 showed complete normalization of LV function.     Current Meds  Medication Sig  . atorvastatin (LIPITOR) 20 MG tablet Take 1 tablet (20 mg total) by mouth daily.  . carvedilol (COREG) 25 MG tablet Take 1 tablet (25 mg total) 2 (two) times daily by mouth.  . levocetirizine (XYZAL) 5 MG tablet Take 5 mg by mouth every morning.   Marland Kitchen losartan (COZAAR) 100 MG tablet Take 1 tablet (100 mg total) by mouth daily.  . montelukast (SINGULAIR) 10 MG tablet Take 10 mg by mouth every  morning.   Marland Kitchen omeprazole (PRILOSEC) 20 MG capsule Take 20 mg by mouth daily.   Marland Kitchen spironolactone (ALDACTONE) 25 MG tablet Take 1 tablet (25 mg total) daily by mouth.     No Known Allergies  Social History   Socioeconomic History  . Marital status: Unknown    Spouse name: Not on file  . Number of children: Not on file  . Years of education: Not on file  . Highest education level: Not on file  Social Needs  . Financial resource strain: Not on file  . Food insecurity - worry: Not on file  . Food insecurity - inability: Not on file  . Transportation needs - medical: Not on file  . Transportation needs - non-medical: Not on file  Occupational History  . Not on file  Tobacco Use  . Smoking status: Light Tobacco Smoker    Types: Cigars  . Smokeless tobacco: Never Used  Substance and Sexual Activity  . Alcohol use: Not on file  . Drug use: Not on file  . Sexual activity: Not on file  Other Topics Concern  . Not on file  Social History Narrative  . Not on file     Review of Systems: General: negative for chills, fever, night sweats or weight changes.  Cardiovascular: negative for chest pain, dyspnea on exertion, edema, orthopnea, palpitations, paroxysmal nocturnal dyspnea or shortness of breath Dermatological: negative for rash Respiratory: negative for cough  or wheezing Urologic: negative for hematuria Abdominal: negative for nausea, vomiting, diarrhea, bright red blood per rectum, melena, or hematemesis Neurologic: negative for visual changes, syncope, or dizziness All other systems reviewed and are otherwise negative except as noted above.    Blood pressure 124/78, pulse (!) 57, height 6' (1.829 m), weight 215 lb 6.4 oz (97.7 kg).  General appearance: alert and no distress Neck: no adenopathy, no carotid bruit, no JVD, supple, symmetrical, trachea midline and thyroid not enlarged, symmetric, no tenderness/mass/nodules Lungs: clear to auscultation bilaterally Heart:  regular rate and rhythm, S1, S2 normal, no murmur, click, rub or gallop Extremities: extremities normal, atraumatic, no cyanosis or edema Pulses: 2+ and symmetric Skin: Skin color, texture, turgor normal. No rashes or lesions Neurologic: Alert and oriented X 3, normal strength and tone. Normal symmetric reflexes. Normal coordination and gait  EKG not performed today  ASSESSMENT AND PLAN:   Sudden cardiac death [facility]) Mr. Tietje returns safe for close hospital follow-up. He had witnessed sudden cardiac death 07-08-17 with CPR and defibrillation in the field with return of spontaneous circulation of 20 minutes. Repeat catheterization revealed normal coronary arteries with left ventriculography consistent with "Takatsubo syndrome". He had a ICD placed 8 days after I saw him for secondary prevention by Dr. Rayann Heman. His recent 2-D echo performed on 10/09/17 revealed complete normalization of LV function. He is asymptomatic.  Essential hypertension History of essential hypertension blood pressure measures 124/78. He is on carvedilol and losartan. Continue current meds at current dosing  Hyperlipidemia History of hyperlipidemia on Lipitor statin lipid profile performed 08/08/17 revealing an LDL of 163, HDL of 47 and triglyceride level of 154. He does admit to eating a diet consistent with heart healthy. I am going to repeat a lipid and liver profile.      Lorretta Harp MD FACP,FACC,FAHA, [facility] 10/24/2017 8:45 AM

## 2017-10-24 NOTE — Assessment & Plan Note (Signed)
History of essential hypertension blood pressure measures 124/78. He is on carvedilol and losartan. Continue current meds at current dosing

## 2017-10-24 NOTE — Assessment & Plan Note (Signed)
[name] returns safe for close hospital follow-up. He had witnessed sudden cardiac death July 04, 2017 with CPR and defibrillation in the field with return of spontaneous circulation of 20 minutes. Repeat catheterization revealed normal coronary arteries with left ventriculography consistent with "Takatsubo syndrome". He had a ICD placed 8 days after I saw him for secondary prevention by Dr. Rayann Heman. His recent 2-D echo performed on 10/09/17 revealed complete normalization of LV function. He is asymptomatic.

## 2017-10-24 NOTE — Patient Instructions (Signed)
Medication Instructions: Your physician recommends that you continue on your current medications as directed. Please refer to the Current Medication list given to you today.  Labwork: Your physician recommends that you return for a FASTING lipid profile and hepatic function panel.   Follow-Up: Your physician wants you to follow-up in: 6 months with Dr. Gwenlyn Found. You will receive a reminder letter in the mail two months in advance. If you don't receive a letter, please call our office to schedule the follow-up appointment.  If you need a refill on your cardiac medications before your next appointment, please call your pharmacy.

## 2017-10-24 NOTE — Assessment & Plan Note (Signed)
History of hyperlipidemia on Lipitor statin lipid profile performed 08/08/17 revealing an LDL of 163, HDL of 47 and triglyceride level of 154. He does admit to eating a diet consistent with heart healthy. I am going to repeat a lipid and liver profile.

## 2017-11-03 ENCOUNTER — Other Ambulatory Visit: Payer: Self-pay | Admitting: Cardiovascular Disease

## 2017-11-11 ENCOUNTER — Other Ambulatory Visit: Payer: Self-pay | Admitting: Cardiovascular Disease

## 2017-11-11 DIAGNOSIS — E785 Hyperlipidemia, unspecified: Secondary | ICD-10-CM

## 2017-11-16 ENCOUNTER — Ambulatory Visit (INDEPENDENT_AMBULATORY_CARE_PROVIDER_SITE_OTHER)

## 2017-11-16 DIAGNOSIS — I428 Other cardiomyopathies: Secondary | ICD-10-CM | POA: Diagnosis not present

## 2017-11-16 DIAGNOSIS — Z95810 Presence of automatic (implantable) cardiac defibrillator: Secondary | ICD-10-CM

## 2017-11-16 NOTE — Progress Notes (Signed)
EPIC Encounter for ICM Monitoring  Patient Name: Jeffery Thomas is a 67 y.o. male Date: 11/16/2017 Primary Care Physican: Bernerd Limbo, MD Primary Cardiologist: Gwenlyn Found Electrophysiologist: Allred Dry Weight: 215 lbs        1st ICM remote transmission.  Heart Failure questions reviewed, pt asymptomatic.  Will move monitor beside bed for automatic transmissions.    Thoracic impedance normal but was abnormal suggesting fluid accumulation during Christmas and Thanksgiving holidays.  Takes Spironolactone 25 mg 1 tablet daily  Recommendations: No changes.  Discussed limiting salt and fluid intake.  Advised to limit salt to 2000 mg daily and fluid take to 64 oz daily.  He reported drinking lot of water, probably twice amount recommended.   Encouraged to call for fluid symptoms.  Follow-up plan: ICM clinic phone appointment on 12/18/2017.    Copy of ICM check sent to Dr. Rayann Heman.   3 month ICM trend: 11/16/2017    1 Year ICM trend:       Rosalene Billings, RN 11/16/2017 3:04 PM

## 2017-12-07 ENCOUNTER — Other Ambulatory Visit: Payer: Self-pay | Admitting: Cardiovascular Disease

## 2017-12-08 NOTE — Telephone Encounter (Signed)
REFILL 

## 2017-12-18 ENCOUNTER — Ambulatory Visit (INDEPENDENT_AMBULATORY_CARE_PROVIDER_SITE_OTHER)

## 2017-12-18 DIAGNOSIS — Z95810 Presence of automatic (implantable) cardiac defibrillator: Secondary | ICD-10-CM

## 2017-12-18 DIAGNOSIS — I428 Other cardiomyopathies: Secondary | ICD-10-CM | POA: Diagnosis not present

## 2017-12-18 NOTE — Progress Notes (Signed)
EPIC Encounter for ICM Monitoring  Patient Name: Jeffery Thomas is a 67 y.o. male Date: 12/18/2017 Primary Care Physican: Bernerd Limbo, MD Primary Cardiologist: Gwenlyn Found Electrophysiologist: Allred Dry Weight:    209 lbs        Heart Failure questions reviewed, pt asymptomatic and reviewed HF symptoms.   Thoracic impedance abnormal suggesting fluid accumulation since 12/04/2017.  Takes Spironolactone 25 mg 1 tablet daily  Recommendations: No changes.  Discussed limiting salt to 2000 mg/day. He said he drinks about 10 bottles of water daily and takes 2 shots of alcohol a day.  Advised he should limit fluids to 64 oz a day and he should follow physicians advice regarding the use of alcohol. He said he drinks a lot of water because his constantly has a salty taste in his mouth.  Advised to try sugar free hard candy or chewing gum if possible to help with the mouth taste.  Follow-up plan: ICM clinic phone appointment on 12/26/2017 to recheck fluid levels  Copy of ICM check sent to Dr. Rayann Heman and Dr. Gwenlyn Found for review and recommendations if needed.   3 month ICM trend: 12/18/2017    1 Year ICM trend:       Rosalene Billings, RN 12/18/2017 12:07 PM

## 2017-12-26 ENCOUNTER — Ambulatory Visit (INDEPENDENT_AMBULATORY_CARE_PROVIDER_SITE_OTHER): Payer: Self-pay

## 2017-12-26 ENCOUNTER — Telehealth: Payer: Self-pay

## 2017-12-26 DIAGNOSIS — I428 Other cardiomyopathies: Secondary | ICD-10-CM

## 2017-12-26 DIAGNOSIS — Z95810 Presence of automatic (implantable) cardiac defibrillator: Secondary | ICD-10-CM

## 2017-12-26 NOTE — Progress Notes (Signed)
EPIC Encounter for ICM Monitoring  Patient Name: JIOVANNY BURDELL is a 67 y.o. male Date: 12/26/2017 Primary Care Physican: Bernerd Limbo, MD Primary Cardiologist:Berry Electrophysiologist:Allred Dry Weight:Previous weight209lbs           Attempted call to patient and wife stated he was busy.  Transmission reviewed.    Thoracic impedance returned to normal after last transmission on 12/18/17 followed by decreased impedance suggesting fluid accumulation 2/16 and returning to baseline.  Takes Spironolactone 25 mg 1 tablet daily  Recommendations: Left ICM number with wife for call back.  Follow-up plan: ICM clinic phone appointment on 01/08/2018 to recheck fluid levels.    Copy of ICM check sent to Dr. Rayann Heman and Dr. Gwenlyn Found.   3 month ICM trend: 12/26/2017    1 Year ICM trend:       Rosalene Billings, RN 12/26/2017 11:50 AM

## 2017-12-26 NOTE — Telephone Encounter (Signed)
Remote ICM transmission received.  Attempted call to patient and wife stated he was busy.  Left ICM direct number for call back.

## 2018-01-08 ENCOUNTER — Ambulatory Visit (INDEPENDENT_AMBULATORY_CARE_PROVIDER_SITE_OTHER): Admitting: *Deleted

## 2018-01-08 DIAGNOSIS — I428 Other cardiomyopathies: Secondary | ICD-10-CM | POA: Diagnosis not present

## 2018-01-08 DIAGNOSIS — Z95810 Presence of automatic (implantable) cardiac defibrillator: Secondary | ICD-10-CM

## 2018-01-09 ENCOUNTER — Telehealth: Payer: Self-pay | Admitting: Cardiovascular Disease

## 2018-01-09 LAB — CUP PACEART REMOTE DEVICE CHECK
Battery Remaining Longevity: 59 mo
Battery Remaining Percentage: 54 %
HIGH POWER IMPEDANCE MEASURED VALUE: 77 Ohm
HighPow Impedance: 77 Ohm
Implantable Pulse Generator Implant Date: 20180828
Lead Channel Impedance Value: 480 Ohm
Lead Channel Setting Pacing Amplitude: 2.5 V
Lead Channel Setting Pacing Pulse Width: 0.5 ms
Lead Channel Setting Sensing Sensitivity: 0.5 mV
MDC IDC LEAD IMPLANT DT: 20180828
MDC IDC LEAD LOCATION: 753860
MDC IDC MSMT BATTERY VOLTAGE: 2.86 V
MDC IDC MSMT LEADCHNL RV PACING THRESHOLD AMPLITUDE: 0.5 V
MDC IDC MSMT LEADCHNL RV PACING THRESHOLD PULSEWIDTH: 0.5 ms
MDC IDC MSMT LEADCHNL RV SENSING INTR AMPL: 11.8 mV
MDC IDC PG SERIAL: 7423068
MDC IDC SESS DTM: 20190305065023
MDC IDC STAT BRADY RV PERCENT PACED: 1 %

## 2018-01-09 NOTE — Progress Notes (Signed)
Remote ICD transmission.   

## 2018-01-09 NOTE — Progress Notes (Signed)
EPIC Encounter for ICM Monitoring  Patient Name: Jeffery Thomas is a 67 y.o. male Date: 01/09/2018 Primary Care Physican: Bernerd Limbo, MD Primary Cardiologist:Berry Electrophysiologist:Allred Dry Weight:Previous weight209lbs         Attempted call to patient and unable to reach.  Left detailed message regarding transmission.  Transmission reviewed.    Thoracic impedance normal.  Takes Spironolactone 25 mg 1 tablet daily  Recommendations:  Left voice mail with ICM number and encouraged to call if experiencing any fluid symptoms.  Follow-up plan: ICM clinic phone appointment on 02/08/2018.    Copy of ICM check sent to Dr. Rayann Heman.   3 month ICM trend: 01/09/2018    1 Year ICM trend:       Rosalene Billings, RN 01/09/2018 12:43 PM

## 2018-01-09 NOTE — Telephone Encounter (Signed)
Returned call to patient's wife.She stated husband saw Dr.Aluisio for severe right knee pain.He wanted husband to wait 1 year before he would consider a knee replacement due to his cardiac condition.She stated she don't think husband can wait 1 year.Stated he has pain everyday.She is afraid husband will take too many pain pills or drink too much alcohol to help with pain.She wanted to ask Dr.Berry if he would clear him to have surgery sooner.Message sent to [facility] for advice.

## 2018-01-09 NOTE — Telephone Encounter (Signed)
Patient wife calling, would like to discuss a concern with nurse.

## 2018-01-10 ENCOUNTER — Encounter: Payer: Self-pay | Admitting: Cardiology

## 2018-01-10 NOTE — Telephone Encounter (Signed)
Given the fact that he had no significant CAD, return of LV function and has an ICD in place I believe he would be safe to undergo knee replacement prior to one year.

## 2018-01-15 ENCOUNTER — Encounter: Payer: Self-pay | Admitting: Cardiovascular Disease

## 2018-01-15 NOTE — Telephone Encounter (Signed)
Spoke to pt to make him aware. Pt very happy and verbalized thanks. Told pt I will send letter to Dr. Anne Fu office.

## 2018-01-24 ENCOUNTER — Encounter: Payer: Self-pay | Admitting: Cardiovascular Disease

## 2018-01-24 ENCOUNTER — Telehealth: Payer: Self-pay | Admitting: Cardiovascular Disease

## 2018-01-24 NOTE — Telephone Encounter (Signed)
Follow Up:    Pt calling to find out the status of his clearance for his knee surgery.Jeffery Thomas

## 2018-01-26 NOTE — Telephone Encounter (Signed)
Received duplicate fax for clearance for left knee - TKA revision scheduled for 04/18/18. Faxed via Epic to Dr. Maureen Ralphs @ [phone]

## 2018-01-26 NOTE — Telephone Encounter (Signed)
Letter was sent to Dr. Anne Fu office.

## 2018-01-29 ENCOUNTER — Other Ambulatory Visit: Payer: Self-pay | Admitting: Cardiovascular Disease

## 2018-02-02 ENCOUNTER — Telehealth: Payer: Self-pay

## 2018-02-02 ENCOUNTER — Other Ambulatory Visit: Payer: Self-pay

## 2018-02-02 DIAGNOSIS — E785 Hyperlipidemia, unspecified: Secondary | ICD-10-CM

## 2018-02-02 MED ORDER — LOSARTAN POTASSIUM 100 MG PO TABS: 100.0000 mg | ORAL_TABLET | Freq: Every day | ORAL | 3 refills | Status: DC

## 2018-02-02 MED ORDER — CARVEDILOL 25 MG PO TABS: 25.0000 mg | ORAL_TABLET | Freq: Two times a day (BID) | ORAL | 3 refills | Status: DC

## 2018-02-02 MED ORDER — ATORVASTATIN CALCIUM 20 MG PO TABS: 20.0000 mg | ORAL_TABLET | Freq: Every day | ORAL | 3 refills | Status: DC

## 2018-02-02 NOTE — Telephone Encounter (Signed)
Spoke with patient  And confirmed he is taking Spironolactone 25 mg tab,  1 tab bid. Told patient I would ask Dr. Gwenlyn Found if this ok to fill RX. Patient verbalized understanding.

## 2018-02-05 NOTE — Telephone Encounter (Signed)
Routed to Dr. Berry to advise 

## 2018-02-05 NOTE — Telephone Encounter (Signed)
Yes

## 2018-02-07 NOTE — Telephone Encounter (Signed)
Rx was refilled on 02/02/18

## 2018-02-08 ENCOUNTER — Telehealth: Payer: Self-pay | Admitting: Cardiology

## 2018-02-08 ENCOUNTER — Encounter

## 2018-02-08 NOTE — Telephone Encounter (Signed)
Spoke with pt and reminded pt of remote transmission that is due today. Pt verbalized understanding.   

## 2018-02-12 MED ORDER — SPIRONOLACTONE 25 MG PO TABS: ORAL_TABLET | ORAL | 1 refills | Status: DC

## 2018-02-16 NOTE — Progress Notes (Signed)
No ICM remote transmission received for 02/08/2018 and next ICM transmission scheduled for 03/08/2018.

## 2018-03-08 ENCOUNTER — Ambulatory Visit (INDEPENDENT_AMBULATORY_CARE_PROVIDER_SITE_OTHER)

## 2018-03-08 ENCOUNTER — Telehealth: Payer: Self-pay

## 2018-03-08 DIAGNOSIS — Z95810 Presence of automatic (implantable) cardiac defibrillator: Secondary | ICD-10-CM | POA: Diagnosis not present

## 2018-03-08 DIAGNOSIS — I428 Other cardiomyopathies: Secondary | ICD-10-CM | POA: Diagnosis not present

## 2018-03-08 NOTE — Progress Notes (Signed)
EPIC Encounter for ICM Monitoring  Patient Name: QUINNTON BURY is a 67 y.o. male Date: 03/08/2018 Primary Care Physican: Bernerd Limbo, MD Primary Cardiologist:Berry Electrophysiologist:Allred Dry Weight:Previous weight209lbs      Attempted call to patient and unable to reach.  Left message to return call regarding transmission.  Transmission reviewed.    Thoracic impedance abnormal suggesting dryness since 03/04/2018.  Impedance was also abnormal suggesting fluid accumulation from 01/21/2018 - 02/02/2018, 02/14/2018 to 02/22/2018.  Pattern of large thoracic impedance peaks in valleys.   Takes Spironolactone 25 mg 1 tablet daily  Recommendations: Left voice mail with ICM number and encouraged to call if experiencing any fluid symptoms.  Follow-up plan: ICM clinic phone appointment on 04/09/2018.    Copy of ICM check sent to Dr. Rayann Heman and Dr. Gwenlyn Found.   3 month ICM trend: 03/08/2018    1 Year ICM trend:       Rosalene Billings, RN 03/08/2018 3:20 PM

## 2018-03-08 NOTE — Progress Notes (Addendum)
Patient returned call. Discussed device impedance pattern.  He said he was drinking 15 bottles of water + 3 cups of coffee daily but his wife told him to stop drinking so much. He significantly decreased fluid intake but appears to be dry.  Advised the fluid intake should be around 64 oz daily but he can try to drinking between 64 and 100 oz to see if can get fluids balanced.  He stopped eating salty foods such as deli meat and sandwiches. Current weight is 202 which is decreased from 209 lbs.  Advised will recheck fluid levels on 03/22/2018.  He has no fluid symptoms.  Encouraged to call if he has any symptoms before next transmission.

## 2018-03-08 NOTE — Telephone Encounter (Signed)
Remote ICM transmission received.  Attempted call to patient and left detailed message per DPR to return call regarding transmission. 

## 2018-03-09 ENCOUNTER — Other Ambulatory Visit: Payer: Self-pay

## 2018-03-09 MED ORDER — LOSARTAN POTASSIUM 100 MG PO TABS: 100.0000 mg | ORAL_TABLET | Freq: Every day | ORAL | 3 refills | Status: DC

## 2018-03-14 ENCOUNTER — Other Ambulatory Visit: Payer: Self-pay

## 2018-03-14 DIAGNOSIS — E785 Hyperlipidemia, unspecified: Secondary | ICD-10-CM

## 2018-03-14 MED ORDER — ATORVASTATIN CALCIUM 20 MG PO TABS: 20.0000 mg | ORAL_TABLET | Freq: Every day | ORAL | 2 refills | Status: DC

## 2018-03-14 NOTE — Telephone Encounter (Signed)
Rx(s) sent to pharmacy electronically.  

## 2018-03-22 ENCOUNTER — Ambulatory Visit (INDEPENDENT_AMBULATORY_CARE_PROVIDER_SITE_OTHER)

## 2018-03-22 DIAGNOSIS — I428 Other cardiomyopathies: Secondary | ICD-10-CM | POA: Diagnosis not present

## 2018-03-22 DIAGNOSIS — Z95810 Presence of automatic (implantable) cardiac defibrillator: Secondary | ICD-10-CM | POA: Diagnosis not present

## 2018-03-23 NOTE — Progress Notes (Signed)
EPIC Encounter for ICM Monitoring  Patient Name: Jeffery Thomas is a 67 y.o. male Date: 03/23/2018 Primary Care Physican: Bernerd Limbo, MD Primary Cardiologist:Berry Electrophysiologist:Allred Dry Weight:Previous weight 202lbs   Attempted call to patient and unable to reach.  Left detailed message, per DPR, regarding transmission.  Transmission reviewed.   Scheduled for total knee replacement 04/18/2018   Thoracic impedance normal but was abnormal suggesting fluid accumulation from 03/13/2018 - 03/20/2018.  Thoracic impedance suggesting dryness from 03/04/2018 to 03/13/2018.   Thoracic impedance pattern of peaks and valleys.   Takes Spironolactone 25 mg 1 tablet daily  Recommendations: Left voice mail with ICM number and encouraged to call if experiencing any fluid symptoms.  Follow-up plan: ICM clinic phone appointment on 04/09/2018.    Copy of ICM check sent to Dr. Rayann Heman and Dr. Gwenlyn Found.   3 month ICM trend: 03/22/2018    1 Year ICM trend:       Rosalene Billings, RN 03/23/2018 10:41 AM

## 2018-04-09 ENCOUNTER — Ambulatory Visit (INDEPENDENT_AMBULATORY_CARE_PROVIDER_SITE_OTHER): Admitting: *Deleted

## 2018-04-09 ENCOUNTER — Telehealth: Payer: Self-pay | Admitting: Cardiology

## 2018-04-09 DIAGNOSIS — Z95810 Presence of automatic (implantable) cardiac defibrillator: Secondary | ICD-10-CM

## 2018-04-09 DIAGNOSIS — I428 Other cardiomyopathies: Secondary | ICD-10-CM

## 2018-04-09 NOTE — Telephone Encounter (Signed)
Spoke with pt and reminded pt of remote transmission that is due today. Pt verbalized understanding.   

## 2018-04-10 ENCOUNTER — Encounter: Payer: Self-pay | Admitting: Cardiology

## 2018-04-10 ENCOUNTER — Telehealth: Payer: Self-pay

## 2018-04-10 NOTE — Progress Notes (Signed)
Remote ICD transmission.   

## 2018-04-10 NOTE — Telephone Encounter (Signed)
Remote ICM transmission received.  Attempted call to patient and left message to return call. 

## 2018-04-10 NOTE — Progress Notes (Signed)
EPIC Encounter for ICM Monitoring  Patient Name: Jeffery Thomas is a 67 y.o. male Date: 04/10/2018 Primary Care Physican: Bernerd Limbo, MD Primary Cardiologist:Berry Electrophysiologist:Allred Dry Weight:Previous weight 202lbs      Attempted call to patient and unable to reach.  Left message to return call.  Transmission reviewed.  Scheduled for total knee replacement 04/18/2018   Thoracic impedance abnormal suggesting fluid accumulation but is close to baseline on 04/09/2018.  Recommendations: NONE - Unable to reach.  Follow-up plan: ICM clinic phone appointment on 04/18/2018 to recheck fluid levels.    Copy of ICM check sent to Dr. Rayann Heman and Dr Gwenlyn Found.   3 month ICM trend: 04/09/2018     Direct Trend Viewer   1 Year ICM trend:       Rosalene Billings, RN 04/10/2018 2:45 PM

## 2018-04-11 ENCOUNTER — Encounter: Payer: Self-pay | Admitting: Cardiology

## 2018-04-12 NOTE — Progress Notes (Signed)
01-24-18 Cardiac clearance from Dr. Gwenlyn Found on chart  07-05-17 (Epic) CXR  10-09-17 (Epic) EKG & ECHO

## 2018-04-12 NOTE — Patient Instructions (Addendum)
Jeffery Thomas  04/12/2018   Your procedure is scheduled on: 04-18-18   Report to [facility] Main  Entrance    Report to Admitting at 12:00 PM    Call this number if you have problems the morning of surgery [phone]   Remember: Do not eat food or drink liquids :After Midnight. You may have a Clear Liquid Diet from Midnight until 8:30 AM. After 8:30 AM, nothing until after surgery.     CLEAR LIQUID DIET   Foods Allowed                                                                     Foods Excluded  Coffee and tea, regular and decaf                             liquids that you cannot  Plain Jell-O in any flavor                                             see through such as: Fruit ices (not with fruit pulp)                                     milk, soups, orange juice  Iced Popsicles                                    All solid food Carbonated beverages, regular and diet                                    Cranberry, grape and apple juices Sports drinks like Gatorade Lightly seasoned clear broth or consume(fat free) Sugar, honey syrup  Sample Menu Breakfast                                Lunch                                     Supper Cranberry juice                    Beef broth                            Chicken broth Jell-O                                     Grape juice  Apple juice Coffee or tea                        Jell-O                                      Popsicle                                                Coffee or tea                        Coffee or tea  _____________________________________________________________________     Take these medicines the morning of surgery with A SIP OF WATER: Carvedilol (Coreg), Montelukast (Singular), and Omeprazole (Prilosec),and Atorvastatin  (Zocor)                                You may not have any metal on your body including hair pins and              piercings   Do not wear jewelry, lotions, powders or deodorant             Men may shave face and neck.   Do not bring valuables to the hospital. [place].  Contacts, dentures or bridgework may not be worn into surgery.  Leave suitcase in the car. After surgery it may be brought to your room.   Special Instructions: Please bring your mask and tubing for your BiPap Machine              Please read over the following fact sheets you were given: _____________________________________________________________________             [facility] - Preparing for Surgery Before surgery, you can play an important role.  Because skin is not sterile, your skin needs to be as free of germs as possible.  You can reduce the number of germs on your skin by washing with CHG (chlorahexidine gluconate) soap before surgery.  CHG is an antiseptic cleaner which kills germs and bonds with the skin to continue killing germs even after washing. Please DO NOT use if you have an allergy to CHG or antibacterial soaps.  If your skin becomes reddened/irritated stop using the CHG and inform your nurse when you arrive at Short Stay. Do not shave (including legs and underarms) for at least 48 hours prior to the first CHG shower.  You may shave your face/neck. Please follow these instructions carefully:  1.  Shower with CHG Soap the night before surgery and the  morning of Surgery.  2.  If you choose to wash your hair, wash your hair first as usual with your  normal  shampoo.  3.  After you shampoo, rinse your hair and body thoroughly to remove the  shampoo.                           4.  Use CHG as you would any other liquid soap.  You can apply chg directly  to the  skin and wash                       Gently with a scrungie or clean washcloth.  5.  Apply the CHG Soap to your body ONLY FROM THE NECK DOWN.   Do not use on face/ open                           Wound or open sores. Avoid contact  with eyes, ears mouth and genitals (private parts).                       Wash face,  Genitals (private parts) with your normal soap.             6.  Wash thoroughly, paying special attention to the area where your surgery  will be performed.  7.  Thoroughly rinse your body with warm water from the neck down.  8.  DO NOT shower/wash with your normal soap after using and rinsing off  the CHG Soap.                9.  Pat yourself dry with a clean towel.            10.  Wear clean pajamas.            11.  Place clean sheets on your bed the night of your first shower and do not  sleep with pets. Day of Surgery : Do not apply any lotions/deodorants the morning of surgery.  Please wear clean clothes to the hospital/surgery center.  FAILURE TO FOLLOW THESE INSTRUCTIONS MAY RESULT IN THE CANCELLATION OF YOUR SURGERY PATIENT SIGNATURE_________________________________  NURSE SIGNATURE__________________________________  ________________________________________________________________________   Jeffery Thomas  An incentive spirometer is a tool that can help keep your lungs clear and active. This tool measures how well you are filling your lungs with each breath. Taking long deep breaths may help reverse or decrease the chance of developing breathing (pulmonary) problems (especially infection) following:  A long period of time when you are unable to move or be active. BEFORE THE PROCEDURE   If the spirometer includes an indicator to show your best effort, your nurse or respiratory therapist will set it to a desired goal.  If possible, sit up straight or lean slightly forward. Try not to slouch.  Hold the incentive spirometer in an upright position. INSTRUCTIONS FOR USE  1. Sit on the edge of your bed if possible, or sit up as far as you can in bed or on a chair. 2. Hold the incentive spirometer in an upright position. 3. Breathe out normally. 4. Place the mouthpiece in your mouth and seal  your lips tightly around it. 5. Breathe in slowly and as deeply as possible, raising the piston or the ball toward the top of the column. 6. Hold your breath for 3-5 seconds or for as long as possible. Allow the piston or ball to fall to the bottom of the column. 7. Remove the mouthpiece from your mouth and breathe out normally. 8. Rest for a few seconds and repeat Steps 1 through 7 at least 10 times every 1-2 hours when you are awake. Take your time and take a few normal breaths between deep breaths. 9. The spirometer may include an indicator to show your best effort. Use the indicator as a goal to work toward during each repetition. 10. After each set  of 10 deep breaths, practice coughing to be sure your lungs are clear. If you have an incision (the cut made at the time of surgery), support your incision when coughing by placing a pillow or rolled up towels firmly against it. Once you are able to get out of bed, walk around indoors and cough well. You may stop using the incentive spirometer when instructed by your caregiver.  RISKS AND COMPLICATIONS  Take your time so you do not get dizzy or light-headed.  If you are in pain, you may need to take or ask for pain medication before doing incentive spirometry. It is harder to take a deep breath if you are having pain. AFTER USE  Rest and breathe slowly and easily.  It can be helpful to keep track of a log of your progress. Your caregiver can provide you with a simple table to help with this. If you are using the spirometer at home, follow these instructions: [place] IF:   You are having difficultly using the spirometer.  You have trouble using the spirometer as often as instructed.  Your pain medication is not giving enough relief while using the spirometer.  You develop fever of 100.5 F (38.1 C) or higher. SEEK IMMEDIATE MEDICAL CARE IF:   You cough up bloody sputum that had not been present before.  You develop fever of  102 F (38.9 C) or greater.  You develop worsening pain at or near the incision site. MAKE SURE YOU:   Understand these instructions.  Will watch your condition.  Will get help right away if you are not doing well or get worse. Document Released: 03/06/2007 Document Revised: 01/16/2012 Document Reviewed: 05/07/2007 ExitCare Patient Information 2014 Exit[employer], [place].   ________________________________________________________________________  WHAT IS A BLOOD TRANSFUSION? Blood Transfusion Information  A transfusion is the replacement of blood or some of its parts. Blood is made up of multiple cells which provide different functions.  Red blood cells carry oxygen and are used for blood loss replacement.  White blood cells fight against infection.  Platelets control bleeding.  Plasma helps clot blood.  Other blood products are available for specialized needs, such as hemophilia or other clotting disorders. BEFORE THE TRANSFUSION  Who gives blood for transfusions?   Healthy volunteers who are fully evaluated to make sure their blood is safe. This is blood bank blood. Transfusion therapy is the safest it has ever been in the practice of medicine. Before blood is taken from a donor, a complete history is taken to make sure that person has no history of diseases nor engages in risky social behavior (examples are intravenous drug use or sexual activity with multiple partners). The donor's travel history is screened to minimize risk of transmitting infections, such as malaria. The donated blood is tested for signs of infectious diseases, such as HIV and hepatitis. The blood is then tested to be sure it is compatible with you in order to minimize the chance of a transfusion reaction. If you or a relative donates blood, this is often done in anticipation of surgery and is not appropriate for emergency situations. It takes many days to process the donated blood. RISKS AND COMPLICATIONS Although  transfusion therapy is very safe and saves many lives, the main dangers of transfusion include:   Getting an infectious disease.  Developing a transfusion reaction. This is an allergic reaction to something in the blood you were given. Every precaution is taken to prevent this. The decision to have a  blood transfusion has been considered carefully by your caregiver before blood is given. Blood is not given unless the benefits outweigh the risks. AFTER THE TRANSFUSION  Right after receiving a blood transfusion, you will usually feel much better and more energetic. This is especially true if your red blood cells have gotten low (anemic). The transfusion raises the level of the red blood cells which carry oxygen, and this usually causes an energy increase.  The nurse administering the transfusion will monitor you carefully for complications. HOME CARE INSTRUCTIONS  No special instructions are needed after a transfusion. You may find your energy is better. Speak with your caregiver about any limitations on activity for underlying diseases you may have. SEEK MEDICAL CARE IF:   Your condition is not improving after your transfusion.  You develop redness or irritation at the intravenous (IV) site. SEEK IMMEDIATE MEDICAL CARE IF:  Any of the following symptoms occur over the next 12 hours:  Shaking chills.  You have a temperature by mouth above 102 F (38.9 C), not controlled by medicine.  Chest, back, or muscle pain.  People around you feel you are not acting correctly or are confused.  Shortness of breath or difficulty breathing.  Dizziness and fainting.  You get a rash or develop hives.  You have a decrease in urine output.  Your urine turns a dark color or changes to pink, red, or brown. Any of the following symptoms occur over the next 10 days:  You have a temperature by mouth above 102 F (38.9 C), not controlled by medicine.  Shortness of breath.  Weakness after normal  activity.  The white part of the eye turns yellow (jaundice).  You have a decrease in the amount of urine or are urinating less often.  Your urine turns a dark color or changes to pink, red, or brown. Document Released: 10/21/2000 Document Revised: 01/16/2012 Document Reviewed: 06/09/2008 [facility] Patient Information 2014 [place], [place].  _______________________________________________________________________

## 2018-04-13 ENCOUNTER — Encounter (HOSPITAL_COMMUNITY): Payer: Self-pay

## 2018-04-13 ENCOUNTER — Other Ambulatory Visit: Payer: Self-pay

## 2018-04-13 ENCOUNTER — Encounter (HOSPITAL_COMMUNITY)
Admission: RE | Admit: 2018-04-13 | Discharge: 2018-04-13 | Disposition: A | Discharge status: Home / Self Care | Source: Ambulatory Visit | Attending: Orthopedic Surgery | Admitting: Orthopedic Surgery

## 2018-04-13 DIAGNOSIS — X58XXXA Exposure to other specified factors, initial encounter: Secondary | ICD-10-CM | POA: Diagnosis not present

## 2018-04-13 DIAGNOSIS — T84023A Instability of internal left knee prosthesis, initial encounter: Secondary | ICD-10-CM | POA: Insufficient documentation

## 2018-04-13 DIAGNOSIS — Z01812 Encounter for preprocedural laboratory examination: Secondary | ICD-10-CM | POA: Insufficient documentation

## 2018-04-13 LAB — PROTIME-INR
INR: 1
Prothrombin Time: 13.1 seconds (ref 11.4–15.2)

## 2018-04-13 LAB — URINALYSIS, ROUTINE W REFLEX MICROSCOPIC
Bilirubin Urine: NEGATIVE
Glucose, UA: NEGATIVE mg/dL
Hgb urine dipstick: NEGATIVE
Ketones, ur: NEGATIVE mg/dL
Leukocytes, UA: NEGATIVE
Nitrite: NEGATIVE
Protein, ur: NEGATIVE mg/dL
Specific Gravity, Urine: 1.004 — ABNORMAL LOW (ref 1.005–1.030)
pH: 6 (ref 5.0–8.0)

## 2018-04-13 LAB — COMPREHENSIVE METABOLIC PANEL
ALT: 27 U/L (ref 17–63)
AST: 21 U/L (ref 15–41)
Albumin: 4.2 g/dL (ref 3.5–5.0)
Alkaline Phosphatase: 59 U/L (ref 38–126)
Anion gap: 8 (ref 5–15)
BUN: 17 mg/dL (ref 6–20)
CO2: 27 mmol/L (ref 22–32)
Calcium: 8.8 mg/dL — ABNORMAL LOW (ref 8.9–10.3)
Chloride: 106 mmol/L (ref 101–111)
Creatinine, Ser: 1.07 mg/dL (ref 0.61–1.24)
GFR calc Af Amer: >60 mL/min (ref >60)
GFR calc non Af Amer: >60 mL/min (ref >60)
Glucose, Bld: 104 mg/dL — ABNORMAL HIGH (ref 65–99)
Potassium: 4 mmol/L (ref 3.5–5.1)
Sodium: 141 mmol/L (ref 135–145)
Total Bilirubin: 0.8 mg/dL (ref 0.3–1.2)
Total Protein: 6.8 g/dL (ref 6.5–8.1)

## 2018-04-13 LAB — CBC
HCT: 45.1 % (ref 39.0–52.0)
Hemoglobin: 14.9 g/dL (ref 13.0–17.0)
MCH: 29 pg (ref 26.0–34.0)
MCHC: 33 g/dL (ref 30.0–36.0)
MCV: 87.7 fL (ref 78.0–100.0)
Platelets: 254 10*3/uL (ref 150–400)
RBC: 5.14 MIL/uL (ref 4.22–5.81)
RDW: 13.1 % (ref 11.5–15.5)
WBC: 6.2 10*3/uL (ref 4.0–10.5)

## 2018-04-13 LAB — SURGICAL PCR SCREEN
MRSA, PCR: NEGATIVE
Staphylococcus aureus: POSITIVE — AB

## 2018-04-13 LAB — APTT: aPTT: 27 seconds (ref 24–36)

## 2018-04-13 LAB — HEMOGLOBIN A1C
HEMOGLOBIN A1C: 6.1 % — AB (ref 4.8–5.6)
Mean Plasma Glucose: 128.37 mg/dL

## 2018-04-14 LAB — ABO/RH: ABO/RH(D): O POS

## 2018-04-16 NOTE — H&P (Signed)
TOTAL KNEE REVISION ADMISSION H&P  Patient is being admitted for left revision total knee arthroplasty.  Subjective:  Chief Complaint:left knee pain.  HPI: DESHAY BLUMENFELD, 67 y.o. male, has a history of pain and functional disability in the left knee(s) due to failed previous arthroplasty and patient has failed non-surgical conservative treatments for greater than 12 weeks to include NSAID's and/or analgesics, flexibility and strengthening excercises, supervised PT with diminished ADL's post treatment and activity modification. The indications for the revision of the total knee arthroplasty are loosening of one or more components and bearing surface wear leading to symptomatic synovitis and tibiofemoral instability. Onset of symptoms was gradual starting 9 years ago with gradually worsening course since that time.  Prior procedures on the left knee(s) include arthroplasty.  Patient currently rates pain in the left knee(s) at 8 out of 10 with activity. There is night pain, worsening of pain with activity and weight bearing, pain that interferes with activities of daily living, pain with passive range of motion, crepitus and joint swelling.  Patient has evidence of prosthetic loosening by imaging studies. This condition presents safety issues increasing the risk of falls.  There is no current active infection.  Patient Active Problem List   Diagnosis Date Noted  . Hyperlipidemia 10/24/2017  . Benign essential HTN   . History of cardiac arrest   . Streptococcal sore throat   . 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 Cardiac device in situ   . Gastroesophageal reflux disease   . Acute systolic congestive heart failure ([place])   . Takatsuki syndrome   . Leukocytosis   . Prediabetes   . HCAP (healthcare-associated pneumonia)   . Agitation   . Acute encephalopathy   . Bradycardia   . Essential hypertension   . Abdominal distention   . Encounter for imaging study to confirm orogastric  (OG) tube placement   . Acute respiratory failure with hypoxia ([place])   . Encounter for central line placement   . 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 ST elevation myocardial infarction (STEMI) [facility])    Past Medical History:  Diagnosis Date  . Abdominal distention   . Acute encephalopathy   . Acute respiratory failure with hypoxia ([place])   . Acute systolic congestive heart failure ([place])   . Agitation   . 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 Benign essential HTN   . Bradycardia   . Cardiac device in situ   . Encounter for central line placement   . Encounter for imaging study to confirm orogastric (OG) tube placement   . Gastroesophageal reflux disease   . HCAP (healthcare-associated pneumonia)   . History of cardiac arrest   . Hypertension   . Leukocytosis   . Prediabetes   . ST elevation myocardial infarction (STEMI) ([place])   . Streptococcal sore throat   . Sudden cardiac death [facility]) June 30, 2017   Sudden cardiac death/Takatsubo  syndrome  . Takatsuki syndrome   . Ventricular fibrillation ([place]) 06/30/2017    Past Surgical History:  Procedure Laterality Date  . ICD IMPLANT N/A 07/04/2017   SJM Fortify Assura VR ICD implanted by Dr Rayann Heman for secondary prevention after VF arrest  . LEFT HEART CATH AND CORONARY ANGIOGRAPHY N/A 2017/06/30   Procedure: LEFT HEART CATH AND CORONARY ANGIOGRAPHY;  Surgeon: Lorretta Harp, MD;  Location: [place] [facility];  Service: Cardiovascular;  Laterality: N/A;  . MENISCUS REPAIR  1983    No current facility-administered medications for  this encounter.    Current Outpatient Medications  Medication Sig Dispense Refill Last Dose  . atorvastatin (LIPITOR) 20 MG tablet Take 1 tablet (20 mg total) by mouth daily. 90 tablet 2   . carvedilol (COREG) 25 MG tablet Take 1 tablet (25 mg total) by mouth 2 (two) times daily. 180 tablet 3   . losartan (COZAAR) 100 MG tablet Take 1 tablet (100 mg  total) by mouth daily. 90 tablet 3   . montelukast (SINGULAIR) 10 MG tablet Take 10 mg by mouth every morning.    Taking  . omeprazole (PRILOSEC) 40 MG capsule Take 40 mg by mouth daily before breakfast.     . spironolactone (ALDACTONE) 25 MG tablet TAKE ONE-HALF TABLET BY MOUTH TWO TIMES A DAY (Patient taking differently: Take 25 mg by mouth daily. ) 90 tablet 1    No Known Allergies  Social History   Tobacco Use  . Smoking status: Former Smoker    Types: Cigars    Last attempt to quit: 06/26/2017    Years since quitting: 0.8  . Smokeless tobacco: Never Used  Substance Use Topics  . Alcohol use: Not Currently    Family History  Problem Relation Age of Onset  . Hypertension Mother   . Diabetes Father   . Cancer Brother        pancreatic      Review of Systems  Constitutional: Negative.   HENT: Negative.   Eyes: Negative.   Respiratory: Negative.   Cardiovascular: Negative.   Gastrointestinal: Negative.   Genitourinary: Negative.   Musculoskeletal: Positive for joint pain and myalgias. Negative for back pain, falls and neck pain.  Skin: Negative.   Neurological: Negative.   Endo/Heme/Allergies: Negative.   Psychiatric/Behavioral: Negative.      Objective:  Physical Exam  Constitutional: He is oriented to person, place, and time. He appears well-developed and well-nourished. No distress.  HENT:  Head: Normocephalic and atraumatic.  Right Ear: External ear normal.  Left Ear: External ear normal.  Nose: Nose normal.  Mouth/Throat: Oropharynx is clear and moist.  Eyes: Conjunctivae and EOM are normal.  Neck: Normal range of motion. Neck supple.  Cardiovascular: Normal rate, regular rhythm, normal heart sounds and intact distal pulses.  No murmur heard. Respiratory: Effort normal and breath sounds normal. No respiratory distress. He has no wheezes.  GI: Bowel sounds are normal. He exhibits no distension. There is no tenderness.  Musculoskeletal:  Examination of the  right hip shows flexion to 120 rotation in 30 abduction 40 and external rotation of 40. There is no tenderness over the greater trochanter. There is no pain on provocative testing of the hip.Evaluation of the left hip shows flexion to 120 rotation in 30 out 40 and abduction 40 without discomfort. There is no tenderness over the greater trochanter. There is no pain on provocative testing of the hip. His RIGHT knee shows no effusion. Range 0-130 with slight crepitus on range of motion. Some tenderness medial greater than lateral with no instability. LEFT knee shows trace effusion with no warmth. Range is 5-90 with firm endpoints at 90. He has slight AP and varus valgus laxity but no gross instability  Neurological: He is alert and oriented to person, place, and time. He has normal strength. No sensory deficit.  Skin: No rash noted. He is not diaphoretic. No erythema.  Psychiatric: He has a normal mood and affect. His behavior is normal.    Ht: 6 ft  Wt: 209 lbs  BMI: 28.3  BP: 142/80 sitting L arm  Pulse: 72 bpm   Imaging Review Plain radiographs demonstrate severe degenerative joint disease of the left knee(s). The overall alignment is neutral.There is evidence of loosening of the femoral and tibial components. The bone quality appears to be good for age and reported activity level.    Preoperative templating of the joint replacement has been completed, documented, and submitted to the Operating Room personnel in order to optimize intra-operative equipment management.   Assessment/Plan:  End stage primary osteoarthritis, left knee(s) with failed previous arthroplasty.   The patient history, physical examination, clinical judgment of the provider and imaging studies are consistent with end stage degenerative joint disease of the left knee(s), previous total knee arthroplasty. Revision total knee arthroplasty is deemed medically necessary. The treatment options including medical management,  injection therapy, arthroscopy and revision arthroplasty were discussed at length. The risks and benefits of revision total knee arthroplasty were presented and reviewed. The risks due to aseptic loosening, infection, stiffness, patella tracking problems, thromboembolic complications and other imponderables were discussed. The patient acknowledged the explanation, agreed to proceed with the plan and consent was signed. Patient is being admitted for inpatient treatment for surgery, pain control, PT, OT, prophylactic antibiotics, VTE prophylaxis, progressive ambulation and ADL's and discharge planning.The patient is planning to be discharged home.    Therapy Plans: outpatient therapy Disposition: home with wife Planned DVT prophylaxis: aspirin 325mg  BID vs Xarelto 10mg  daily DME needed: needs walker PCP: [place] Cardio: [place] TXA IV  Ardeen Jourdain, PA-C

## 2018-04-17 MED ORDER — TRANEXAMIC ACID 1000 MG/10ML IV SOLN
1000.0000 mg | INTRAVENOUS | Status: DC
  Filled 2018-04-17: qty 10

## 2018-04-17 MED ORDER — BUPIVACAINE LIPOSOME 1.3 % IJ SUSP
20.0000 mL | INTRAMUSCULAR | Status: DC
  Filled 2018-04-17: qty 20

## 2018-04-18 ENCOUNTER — Inpatient Hospital Stay (HOSPITAL_COMMUNITY): Admitting: Certified Registered"

## 2018-04-18 ENCOUNTER — Other Ambulatory Visit: Payer: Self-pay

## 2018-04-18 ENCOUNTER — Encounter (HOSPITAL_COMMUNITY): Payer: Self-pay | Admitting: *Deleted

## 2018-04-18 ENCOUNTER — Encounter (HOSPITAL_COMMUNITY)
Admission: RE | Disposition: A | Discharge status: Home / Self Care | Payer: Self-pay | Source: Home / Self Care | Attending: Orthopedic Surgery

## 2018-04-18 ENCOUNTER — Inpatient Hospital Stay (HOSPITAL_COMMUNITY)
Admission: RE | Admit: 2018-04-18 | Discharge: 2018-04-20 | DRG: 468 | Disposition: A | Discharge status: Home / Self Care | Attending: Orthopedic Surgery | Admitting: Orthopedic Surgery

## 2018-04-18 ENCOUNTER — Encounter

## 2018-04-18 DIAGNOSIS — Z96659 Presence of unspecified artificial knee joint: Secondary | ICD-10-CM

## 2018-04-18 DIAGNOSIS — I110 Hypertensive heart disease with heart failure: Secondary | ICD-10-CM | POA: Diagnosis present

## 2018-04-18 DIAGNOSIS — M1712 Unilateral primary osteoarthritis, left knee: Secondary | ICD-10-CM | POA: Diagnosis present

## 2018-04-18 DIAGNOSIS — Z8674 Personal history of sudden cardiac arrest: Secondary | ICD-10-CM

## 2018-04-18 DIAGNOSIS — I252 Old myocardial infarction: Secondary | ICD-10-CM

## 2018-04-18 DIAGNOSIS — K219 Gastro-esophageal reflux disease without esophagitis: Secondary | ICD-10-CM | POA: Diagnosis present

## 2018-04-18 DIAGNOSIS — Z800 Family history of malignant neoplasm of digestive organs: Secondary | ICD-10-CM

## 2018-04-18 DIAGNOSIS — I5022 Chronic systolic (congestive) heart failure: Secondary | ICD-10-CM | POA: Diagnosis present

## 2018-04-18 DIAGNOSIS — Y792 Prosthetic and other implants, materials and accessory orthopedic devices associated with adverse incidents: Secondary | ICD-10-CM

## 2018-04-18 DIAGNOSIS — T84033A Mechanical loosening of internal left knee prosthetic joint, initial encounter: Secondary | ICD-10-CM | POA: Diagnosis present

## 2018-04-18 DIAGNOSIS — Z9181 History of falling: Secondary | ICD-10-CM | POA: Diagnosis not present

## 2018-04-18 DIAGNOSIS — Z8249 Family history of ischemic heart disease and other diseases of the circulatory system: Secondary | ICD-10-CM | POA: Diagnosis not present

## 2018-04-18 DIAGNOSIS — M85652 Other cyst of bone, left thigh: Secondary | ICD-10-CM | POA: Diagnosis present

## 2018-04-18 DIAGNOSIS — N5089 Other specified disorders of the male genital organs: Secondary | ICD-10-CM | POA: Diagnosis present

## 2018-04-18 DIAGNOSIS — E785 Hyperlipidemia, unspecified: Secondary | ICD-10-CM | POA: Diagnosis present

## 2018-04-18 DIAGNOSIS — Z95810 Presence of automatic (implantable) cardiac defibrillator: Secondary | ICD-10-CM | POA: Diagnosis not present

## 2018-04-18 DIAGNOSIS — Z87891 Personal history of nicotine dependence: Secondary | ICD-10-CM

## 2018-04-18 DIAGNOSIS — T84018A Broken internal joint prosthesis, other site, initial encounter: Secondary | ICD-10-CM

## 2018-04-18 DIAGNOSIS — T84093A Other mechanical complication of internal left knee prosthesis, initial encounter: Secondary | ICD-10-CM | POA: Diagnosis present

## 2018-04-18 HISTORY — PX: TOTAL KNEE REVISION: SHX996

## 2018-04-18 HISTORY — DX: Presence of unspecified artificial knee joint: Z96.659

## 2018-04-18 LAB — TYPE AND SCREEN
ABO/RH(D): O POS
Antibody Screen: NEGATIVE

## 2018-04-18 SURGERY — TOTAL KNEE REVISION
Anesthesia: Spinal | Site: Knee | Laterality: Left

## 2018-04-18 MED ORDER — SODIUM CHLORIDE 0.9 % IV SOLN
INTRAVENOUS | Status: DC
  Administered 2018-04-18 – 2018-04-19 (×2): via INTRAVENOUS

## 2018-04-18 MED ORDER — EPHEDRINE SULFATE-NACL 50-0.9 MG/10ML-% IV SOSY
PREFILLED_SYRINGE | INTRAVENOUS | Status: DC | PRN
  Administered 2018-04-18 (×3): 10 mg via INTRAVENOUS

## 2018-04-18 MED ORDER — METHOCARBAMOL 1000 MG/10ML IJ SOLN
500.0000 mg | Freq: Four times a day (QID) | INTRAVENOUS | Status: DC | PRN
  Administered 2018-04-18: 500 mg via INTRAVENOUS
  Filled 2018-04-18: qty 550

## 2018-04-18 MED ORDER — PROPOFOL 10 MG/ML IV BOLUS
INTRAVENOUS | Status: AC
  Filled 2018-04-18: qty 20

## 2018-04-18 MED ORDER — OXYCODONE HCL 5 MG PO TABS
10.0000 mg | ORAL_TABLET | ORAL | Status: DC | PRN
  Administered 2018-04-19 – 2018-04-20 (×5): 15 mg via ORAL
  Filled 2018-04-18 (×5): qty 3
  Filled 2018-04-18: qty 2

## 2018-04-18 MED ORDER — RIVAROXABAN 10 MG PO TABS
10.0000 mg | ORAL_TABLET | Freq: Every day | ORAL | Status: DC
  Administered 2018-04-19 – 2018-04-20 (×2): 10 mg via ORAL
  Filled 2018-04-18 (×2): qty 1

## 2018-04-18 MED ORDER — PROPOFOL 500 MG/50ML IV EMUL
INTRAVENOUS | Status: DC | PRN
  Administered 2018-04-18: 100 ug/kg/min via INTRAVENOUS

## 2018-04-18 MED ORDER — LOSARTAN POTASSIUM 50 MG PO TABS
100.0000 mg | ORAL_TABLET | Freq: Every day | ORAL | Status: DC
  Administered 2018-04-19 – 2018-04-20 (×2): 100 mg via ORAL
  Filled 2018-04-18 (×2): qty 2

## 2018-04-18 MED ORDER — LACTATED RINGERS IV SOLN
INTRAVENOUS | Status: DC
  Administered 2018-04-18 (×2): via INTRAVENOUS

## 2018-04-18 MED ORDER — GABAPENTIN 300 MG PO CAPS
300.0000 mg | ORAL_CAPSULE | Freq: Once | ORAL | Status: AC
  Administered 2018-04-18: 300 mg via ORAL
  Filled 2018-04-18: qty 1

## 2018-04-18 MED ORDER — SODIUM CHLORIDE 0.9 % IR SOLN
Status: DC | PRN
  Administered 2018-04-18: 3000 mL

## 2018-04-18 MED ORDER — POLYETHYLENE GLYCOL 3350 17 G PO PACK
17.0000 g | PACK | Freq: Every day | ORAL | Status: DC | PRN
  Administered 2018-04-20: 17 g via ORAL
  Filled 2018-04-18: qty 1

## 2018-04-18 MED ORDER — BISACODYL 10 MG RE SUPP: 10.0000 mg | Freq: Every day | RECTAL | Status: DC | PRN

## 2018-04-18 MED ORDER — ONDANSETRON HCL 4 MG PO TABS: 4.0000 mg | ORAL_TABLET | Freq: Four times a day (QID) | ORAL | Status: DC | PRN

## 2018-04-18 MED ORDER — ONDANSETRON HCL 4 MG/2ML IJ SOLN
INTRAMUSCULAR | Status: DC | PRN
  Administered 2018-04-18: 4 mg via INTRAVENOUS

## 2018-04-18 MED ORDER — SODIUM CHLORIDE 0.9 % IJ SOLN
INTRAMUSCULAR | Status: AC
  Filled 2018-04-18: qty 50

## 2018-04-18 MED ORDER — ONDANSETRON HCL 4 MG/2ML IJ SOLN: 4.0000 mg | Freq: Once | INTRAMUSCULAR | Status: DC | PRN

## 2018-04-18 MED ORDER — GLYCOPYRROLATE 0.2 MG/ML IV SOSY
PREFILLED_SYRINGE | INTRAVENOUS | Status: AC
  Filled 2018-04-18: qty 5

## 2018-04-18 MED ORDER — TRAMADOL HCL 50 MG PO TABS: 50.0000 mg | ORAL_TABLET | Freq: Four times a day (QID) | ORAL | Status: DC | PRN

## 2018-04-18 MED ORDER — ACETAMINOPHEN 10 MG/ML IV SOLN
INTRAVENOUS | Status: AC
  Filled 2018-04-18: qty 100

## 2018-04-18 MED ORDER — DEXAMETHASONE SODIUM PHOSPHATE 10 MG/ML IJ SOLN
10.0000 mg | Freq: Once | INTRAMUSCULAR | Status: AC
  Administered 2018-04-19: 10 mg via INTRAVENOUS
  Filled 2018-04-18: qty 1

## 2018-04-18 MED ORDER — LIDOCAINE 2% (20 MG/ML) 5 ML SYRINGE
INTRAMUSCULAR | Status: DC | PRN
  Administered 2018-04-18: 50 mg via INTRAVENOUS

## 2018-04-18 MED ORDER — 0.9 % SODIUM CHLORIDE (POUR BTL) OPTIME
TOPICAL | Status: DC | PRN
  Administered 2018-04-18: 1000 mL

## 2018-04-18 MED ORDER — PANTOPRAZOLE SODIUM 40 MG PO TBEC
80.0000 mg | DELAYED_RELEASE_TABLET | Freq: Every day | ORAL | Status: DC
  Administered 2018-04-19 – 2018-04-20 (×2): 80 mg via ORAL
  Filled 2018-04-18 (×2): qty 2

## 2018-04-18 MED ORDER — FENTANYL CITRATE (PF) 100 MCG/2ML IJ SOLN
100.0000 ug | INTRAMUSCULAR | Status: DC | PRN
  Administered 2018-04-18 (×2): 50 ug via INTRAVENOUS

## 2018-04-18 MED ORDER — METOCLOPRAMIDE HCL 5 MG/ML IJ SOLN: 5.0000 mg | Freq: Three times a day (TID) | INTRAMUSCULAR | Status: DC | PRN

## 2018-04-18 MED ORDER — MONTELUKAST SODIUM 10 MG PO TABS
10.0000 mg | ORAL_TABLET | Freq: Every day | ORAL | Status: DC
  Administered 2018-04-19 – 2018-04-20 (×2): 10 mg via ORAL
  Filled 2018-04-18 (×2): qty 1

## 2018-04-18 MED ORDER — ATORVASTATIN CALCIUM 20 MG PO TABS
20.0000 mg | ORAL_TABLET | Freq: Every day | ORAL | Status: DC
  Administered 2018-04-19 – 2018-04-20 (×2): 20 mg via ORAL
  Filled 2018-04-18 (×2): qty 1

## 2018-04-18 MED ORDER — MIDAZOLAM HCL 2 MG/2ML IJ SOLN
2.0000 mg | INTRAMUSCULAR | Status: DC | PRN
  Administered 2018-04-18: 2 mg via INTRAVENOUS
  Filled 2018-04-18: qty 2

## 2018-04-18 MED ORDER — OXYCODONE HCL 5 MG PO TABS
5.0000 mg | ORAL_TABLET | ORAL | Status: DC | PRN
  Administered 2018-04-18: 10 mg via ORAL
  Administered 2018-04-18: 5 mg via ORAL
  Administered 2018-04-19 (×2): 10 mg via ORAL
  Filled 2018-04-18: qty 2
  Filled 2018-04-18: qty 1
  Filled 2018-04-18: qty 2
  Filled 2018-04-18: qty 1
  Filled 2018-04-18: qty 2

## 2018-04-18 MED ORDER — PHENYLEPHRINE 40 MCG/ML (10ML) SYRINGE FOR IV PUSH (FOR BLOOD PRESSURE SUPPORT)
PREFILLED_SYRINGE | INTRAVENOUS | Status: DC | PRN
  Administered 2018-04-18: 80 ug via INTRAVENOUS

## 2018-04-18 MED ORDER — STERILE WATER FOR IRRIGATION IR SOLN
Status: DC | PRN
  Administered 2018-04-18: 2000 mL

## 2018-04-18 MED ORDER — METHOCARBAMOL 500 MG PO TABS
500.0000 mg | ORAL_TABLET | Freq: Four times a day (QID) | ORAL | Status: DC | PRN
  Administered 2018-04-18 – 2018-04-19 (×3): 500 mg via ORAL
  Filled 2018-04-18 (×3): qty 1

## 2018-04-18 MED ORDER — SPIRONOLACTONE 25 MG PO TABS
25.0000 mg | ORAL_TABLET | Freq: Every day | ORAL | Status: DC
  Administered 2018-04-19 – 2018-04-20 (×2): 25 mg via ORAL
  Filled 2018-04-18 (×2): qty 1

## 2018-04-18 MED ORDER — FENTANYL CITRATE (PF) 100 MCG/2ML IJ SOLN
INTRAMUSCULAR | Status: AC
  Administered 2018-04-18: 50 ug via INTRAVENOUS
  Filled 2018-04-18: qty 2

## 2018-04-18 MED ORDER — DIPHENHYDRAMINE HCL 12.5 MG/5ML PO ELIX: 12.5000 mg | ORAL_SOLUTION | ORAL | Status: DC | PRN

## 2018-04-18 MED ORDER — HYDROMORPHONE HCL 1 MG/ML IJ SOLN: 0.2500 mg | INTRAMUSCULAR | Status: DC | PRN

## 2018-04-18 MED ORDER — ACETAMINOPHEN 325 MG PO TABS
325.0000 mg | ORAL_TABLET | Freq: Four times a day (QID) | ORAL | Status: DC | PRN
  Administered 2018-04-19: 650 mg via ORAL
  Filled 2018-04-18: qty 2

## 2018-04-18 MED ORDER — ROPIVACAINE HCL 7.5 MG/ML IJ SOLN
INTRAMUSCULAR | Status: DC | PRN
  Administered 2018-04-18: 20 mL via PERINEURAL

## 2018-04-18 MED ORDER — PHENOL 1.4 % MT LIQD
1.0000 | OROMUCOSAL | Status: DC | PRN
  Filled 2018-04-18: qty 177

## 2018-04-18 MED ORDER — CEFAZOLIN SODIUM-DEXTROSE 2-4 GM/100ML-% IV SOLN
2.0000 g | Freq: Four times a day (QID) | INTRAVENOUS | Status: AC
  Administered 2018-04-18 – 2018-04-19 (×2): 2 g via INTRAVENOUS
  Filled 2018-04-18 (×2): qty 100

## 2018-04-18 MED ORDER — GLYCOPYRROLATE 0.2 MG/ML IJ SOLN
INTRAMUSCULAR | Status: DC | PRN
  Administered 2018-04-18: 0.1 mg via INTRAVENOUS

## 2018-04-18 MED ORDER — BUPIVACAINE IN DEXTROSE 0.75-8.25 % IT SOLN
INTRATHECAL | Status: DC | PRN
  Administered 2018-04-18: 2 mL via INTRATHECAL

## 2018-04-18 MED ORDER — ACETAMINOPHEN 10 MG/ML IV SOLN
1000.0000 mg | Freq: Four times a day (QID) | INTRAVENOUS | Status: DC
  Administered 2018-04-18: 1000 mg via INTRAVENOUS
  Filled 2018-04-18: qty 100

## 2018-04-18 MED ORDER — MEPERIDINE HCL 50 MG/ML IJ SOLN: 6.2500 mg | INTRAMUSCULAR | Status: DC | PRN

## 2018-04-18 MED ORDER — BUPIVACAINE LIPOSOME 1.3 % IJ SUSP
INTRAMUSCULAR | Status: DC | PRN
  Administered 2018-04-18: 20 mL

## 2018-04-18 MED ORDER — SODIUM CHLORIDE 0.9 % IJ SOLN
INTRAMUSCULAR | Status: DC | PRN
  Administered 2018-04-18: 60 mL

## 2018-04-18 MED ORDER — MENTHOL 3 MG MT LOZG: 1.0000 | LOZENGE | OROMUCOSAL | Status: DC | PRN

## 2018-04-18 MED ORDER — EPHEDRINE 5 MG/ML INJ
INTRAVENOUS | Status: AC
  Filled 2018-04-18: qty 10

## 2018-04-18 MED ORDER — DEXAMETHASONE SODIUM PHOSPHATE 10 MG/ML IJ SOLN
INTRAMUSCULAR | Status: DC | PRN
  Administered 2018-04-18: 10 mg via INTRAVENOUS

## 2018-04-18 MED ORDER — METOCLOPRAMIDE HCL 5 MG PO TABS: 5.0000 mg | ORAL_TABLET | Freq: Three times a day (TID) | ORAL | Status: DC | PRN

## 2018-04-18 MED ORDER — FLEET ENEMA 7-19 GM/118ML RE ENEM: 1.0000 | ENEMA | Freq: Once | RECTAL | Status: DC | PRN

## 2018-04-18 MED ORDER — CEFAZOLIN SODIUM-DEXTROSE 2-4 GM/100ML-% IV SOLN
2.0000 g | INTRAVENOUS | Status: AC
  Administered 2018-04-18: 2 g via INTRAVENOUS
  Filled 2018-04-18: qty 100

## 2018-04-18 MED ORDER — HYDROMORPHONE HCL 1 MG/ML IJ SOLN: 0.5000 mg | INTRAMUSCULAR | Status: DC | PRN

## 2018-04-18 MED ORDER — DOCUSATE SODIUM 100 MG PO CAPS
100.0000 mg | ORAL_CAPSULE | Freq: Two times a day (BID) | ORAL | Status: DC
  Administered 2018-04-18 – 2018-04-20 (×4): 100 mg via ORAL
  Filled 2018-04-18 (×4): qty 1

## 2018-04-18 MED ORDER — SODIUM CHLORIDE 0.9 % IJ SOLN
INTRAMUSCULAR | Status: AC
  Filled 2018-04-18: qty 10

## 2018-04-18 MED ORDER — DEXAMETHASONE SODIUM PHOSPHATE 10 MG/ML IJ SOLN: 8.0000 mg | Freq: Once | INTRAMUSCULAR | Status: DC

## 2018-04-18 MED ORDER — CARVEDILOL 25 MG PO TABS
25.0000 mg | ORAL_TABLET | Freq: Two times a day (BID) | ORAL | Status: DC
  Administered 2018-04-18 – 2018-04-20 (×4): 25 mg via ORAL
  Filled 2018-04-18 (×4): qty 1

## 2018-04-18 MED ORDER — CHLORHEXIDINE GLUCONATE 4 % EX LIQD
60.0000 mL | Freq: Once | CUTANEOUS | Status: AC
  Administered 2018-04-18: 4 via TOPICAL

## 2018-04-18 MED ORDER — ACETAMINOPHEN 500 MG PO TABS
1000.0000 mg | ORAL_TABLET | Freq: Four times a day (QID) | ORAL | Status: AC
  Administered 2018-04-18 – 2018-04-19 (×3): 1000 mg via ORAL
  Filled 2018-04-18 (×3): qty 2

## 2018-04-18 MED ORDER — ONDANSETRON HCL 4 MG/2ML IJ SOLN: 4.0000 mg | Freq: Four times a day (QID) | INTRAMUSCULAR | Status: DC | PRN

## 2018-04-18 SURGICAL SUPPLY — 69 items
ADAPTER BOLT FEMORAL +2/-2 (Knees) ×2 IMPLANT
AUGMENT FMRL POST PFC 4MM SZ5 (Orthopedic Implant) ×1 IMPLANT
AUGMENT POST 5 8 (Orthopedic Implant) ×2 IMPLANT
BAG ZIPLOCK 12X15 (MISCELLANEOUS) ×2 IMPLANT
BANDAGE ACE 6X5 VEL STRL LF (GAUZE/BANDAGES/DRESSINGS) ×2 IMPLANT
BLADE SAG 18X100X1.27 (BLADE) ×2 IMPLANT
BLADE SAW SGTL 11.0X1.19X90.0M (BLADE) ×2 IMPLANT
BLADE SAW SGTL 81X20 HD (BLADE) ×2 IMPLANT
BONE CEMENT GENTAMICIN (Cement) ×6 IMPLANT
CEMENT BONE GENTAMICIN 40 (Cement) ×3 IMPLANT
CLOTH BEACON ORANGE TIMEOUT ST (SAFETY) ×2 IMPLANT
COVER SURGICAL LIGHT HANDLE (MISCELLANEOUS) ×2 IMPLANT
CUFF TOURN SGL QUICK 34 (TOURNIQUET CUFF) ×1
CUFF TRNQT CYL 34X4X40X1 (TOURNIQUET CUFF) ×1 IMPLANT
DECANTER SPIKE VIAL GLASS SM (MISCELLANEOUS) ×4 IMPLANT
DRAPE U-SHAPE 47X51 STRL (DRAPES) ×2 IMPLANT
DRSG ADAPTIC 3X8 NADH LF (GAUZE/BANDAGES/DRESSINGS) ×2 IMPLANT
DURAPREP 26ML APPLICATOR (WOUND CARE) ×2 IMPLANT
ELECT REM PT RETURN 15FT ADLT (MISCELLANEOUS) ×2 IMPLANT
EVACUATOR 1/8 PVC DRAIN (DRAIN) ×2 IMPLANT
FEM POST AUG PFC 4MM SZ5 (Orthopedic Implant) ×2 IMPLANT
FEMORAL ADAPTER (Orthopedic Implant) ×2 IMPLANT
FEMUR LFT TC3 REVISION (Knees) ×2 IMPLANT
GAUZE SPONGE 4X4 12PLY STRL (GAUZE/BANDAGES/DRESSINGS) ×2 IMPLANT
GLOVE BIO SURGEON STRL SZ 6.5 (GLOVE) ×2 IMPLANT
GLOVE BIO SURGEON STRL SZ8 (GLOVE) ×2 IMPLANT
GLOVE BIOGEL PI IND STRL 6.5 (GLOVE) ×2 IMPLANT
GLOVE BIOGEL PI IND STRL 7.0 (GLOVE) ×2 IMPLANT
GLOVE BIOGEL PI IND STRL 7.5 (GLOVE) ×4 IMPLANT
GLOVE BIOGEL PI IND STRL 8 (GLOVE) ×2 IMPLANT
GLOVE BIOGEL PI INDICATOR 6.5 (GLOVE) ×2
GLOVE BIOGEL PI INDICATOR 7.0 (GLOVE) ×2
GLOVE BIOGEL PI INDICATOR 7.5 (GLOVE) ×4
GLOVE BIOGEL PI INDICATOR 8 (GLOVE) ×2
GLOVE SURG ORTHO 8.0 STRL STRW (GLOVE) ×2 IMPLANT
GLOVE SURG SS PI 7.5 STRL IVOR (GLOVE) ×4 IMPLANT
GOWN STRL REUS W/ TWL XL LVL3 (GOWN DISPOSABLE) ×2 IMPLANT
GOWN STRL REUS W/TWL 2XL LVL3 (GOWN DISPOSABLE) ×2 IMPLANT
GOWN STRL REUS W/TWL LRG LVL3 (GOWN DISPOSABLE) ×6 IMPLANT
GOWN STRL REUS W/TWL XL LVL3 (GOWN DISPOSABLE) ×2
HANDPIECE INTERPULSE COAX TIP (DISPOSABLE) ×1
HOLDER FOLEY CATH W/STRAP (MISCELLANEOUS) ×2 IMPLANT
IMMOBILIZER KNEE 20 (SOFTGOODS) ×2
IMMOBILIZER KNEE 20 THIGH 36 (SOFTGOODS) ×1 IMPLANT
INSERT TIBIAL TC3 RP SZ5 12.5 (Knees) ×2 IMPLANT
MANIFOLD NEPTUNE II (INSTRUMENTS) ×2 IMPLANT
PACK TOTAL KNEE CUSTOM (KITS) ×2 IMPLANT
PAD ABD 8X10 STRL (GAUZE/BANDAGES/DRESSINGS) ×2 IMPLANT
PADDING CAST COTTON 6X4 STRL (CAST SUPPLIES) ×6 IMPLANT
POSITIONER SURGICAL ARM (MISCELLANEOUS) ×2 IMPLANT
RESTRICTOR CEMENT SZ 5 C-STEM (Cement) ×2 IMPLANT
SET HNDPC FAN SPRY TIP SCT (DISPOSABLE) ×1 IMPLANT
STAPLER VISISTAT 35W (STAPLE) ×2 IMPLANT
STEM TIBIA PFC 13X30MM (Stem) ×2 IMPLANT
STEM UNIVERSAL REVISION 75X22 (Stem) ×2 IMPLANT
SUT PDS AB 1 CT1 27 (SUTURE) ×2 IMPLANT
SUT STRATAFIX 0 PDS 27 VIOLET (SUTURE) ×2
SUT VIC AB 2-0 CT1 27 (SUTURE) ×4
SUT VIC AB 2-0 CT1 TAPERPNT 27 (SUTURE) ×4 IMPLANT
SUTURE STRATFX 0 PDS 27 VIOLET (SUTURE) ×1 IMPLANT
TOWER CARTRIDGE SMART MIX (DISPOSABLE) ×2 IMPLANT
TRAY FOLEY MTR SLVR 16FR STAT (SET/KITS/TRAYS/PACK) ×2 IMPLANT
TRAY SLEEVE CEM ML (Knees) ×2 IMPLANT
TRAY TIB SZ 5 REVISION (Knees) ×2 IMPLANT
TUBE KAMVAC SUCTION (TUBING) IMPLANT
WEDGE LEFT SIZE 54MM (Knees) ×2 IMPLANT
WEDGE SZ 5 5MM (Knees) ×4 IMPLANT
WRAP KNEE MAXI GEL POST OP (GAUZE/BANDAGES/DRESSINGS) ×2 IMPLANT
YANKAUER SUCT BULB TIP 10FT TU (MISCELLANEOUS) ×4 IMPLANT

## 2018-04-18 NOTE — Transfer of Care (Signed)
Immediate Anesthesia Transfer of Care Note  Patient: Jeffery Thomas  Procedure(s) Performed: LEFT TOTAL KNEE REVISION (Left Knee)  Patient Location: PACU  Anesthesia Type:Spinal  Level of Consciousness: awake, alert , oriented and patient cooperative  Airway & Oxygen Therapy: Patient Spontanous Breathing and Patient connected to face mask oxygen  Post-op Assessment: Report given to RN, Post -op Vital signs reviewed and stable and Patient moving all extremities  Post vital signs: Reviewed and stable  Last Vitals:  Vitals Value Taken Time  BP 153/85 04/18/2018  4:20 PM  Temp    Pulse 54 04/18/2018  4:25 PM  Resp 15 04/18/2018  4:25 PM  SpO2 100 % 04/18/2018  4:25 PM  Vitals shown include unvalidated device data.  Last Pain:  Vitals:   04/18/18 1347  TempSrc:   PainSc: 0-No pain         Complications: No apparent anesthesia complications

## 2018-04-18 NOTE — Interval H&P Note (Signed)
History and Physical Interval Note:  04/18/2018 12:29 PM  Jeffery Thomas  has presented today for surgery, with the diagnosis of Failed Left total knee arthroplasty  The various methods of treatment have been discussed with the patient and family. After consideration of risks, benefits and other options for treatment, the patient has consented to  Procedure(s): LEFT TOTAL KNEE REVISION (Left) as a surgical intervention .  The patient's history has been reviewed, patient examined, no change in status, stable for surgery.  I have reviewed the patient's chart and labs.  Questions were answered to the patient's satisfaction.     Pilar Plate Caulin Begley

## 2018-04-18 NOTE — Anesthesia Postprocedure Evaluation (Signed)
Anesthesia Post Note  Patient: Hunter Bachar Deberry  Procedure(s) Performed: LEFT TOTAL KNEE REVISION (Left Knee)     Patient location during evaluation: PACU Anesthesia Type: Spinal Level of consciousness: oriented and awake and alert Pain management: pain level controlled Vital Signs Assessment: post-procedure vital signs reviewed and stable Respiratory status: spontaneous breathing, respiratory function stable and patient connected to nasal cannula oxygen Cardiovascular status: blood pressure returned to baseline and stable Postop Assessment: no headache, no backache and no apparent nausea or vomiting Anesthetic complications: no    Last Vitals:  Vitals:   04/18/18 1715 04/18/18 1733  BP: (!) 159/90 (!) 156/85  Pulse: (!) 46 (!) 48  Resp: 13 16  Temp: (!) 36.4 C 36.4 C  SpO2: 98% 97%    Last Pain:  Vitals:   04/18/18 1733  TempSrc: Oral  PainSc: 0-No pain                 Kerianne Gurr DAVID

## 2018-04-18 NOTE — Anesthesia Procedure Notes (Signed)
Anesthesia Regional Block: Adductor canal block   Pre-Anesthetic Checklist: ,, timeout performed, Correct Patient, Correct Site, Correct Laterality, Correct Procedure, Correct Position, site marked, Risks and benefits discussed,  Surgical consent,  Pre-op evaluation,  At surgeon's request and post-op pain management  Laterality: Left  Prep: chloraprep       Needles:  Injection technique: Single-shot  Needle Type: Echogenic Stimulator Needle     Needle Length: 9cm  Needle Gauge: 21     Additional Needles:   Narrative:  Start time: 04/18/2018 1:35 PM End time: 04/18/2018 1:45 PM Injection made incrementally with aspirations every 5 mL.  Performed by: Personally  Anesthesiologist: Lillia Abed, MD  Additional Notes: Monitors applied. Patient sedated. Sterile prep and drape,hand hygiene and sterile gloves were used. Relevant anatomy identified.Needle position confirmed.Local anesthetic injected incrementally after negative aspiration. Local anesthetic spread visualized around nerve(s). Vascular puncture avoided. No complications. Image printed for medical record.The patient tolerated the procedure well.    Lillia Abed MD

## 2018-04-18 NOTE — Anesthesia Preprocedure Evaluation (Signed)
Anesthesia Evaluation  Patient identified by MRN, date of birth, ID band Patient awake    Reviewed: Allergy & Precautions, NPO status , Patient's Chart, lab work & pertinent test results  Airway Mallampati: II  TM Distance: >3 FB Neck ROM: Full    Dental   Pulmonary former smoker,    Pulmonary exam normal        Cardiovascular hypertension, Pt. on medications + Past MI  Normal cardiovascular exam+ Cardiac Defibrillator      Neuro/Psych    GI/Hepatic GERD  Medicated and Controlled,  Endo/Other    Renal/GU      Musculoskeletal   Abdominal   Peds  Hematology   Anesthesia Other Findings   Reproductive/Obstetrics                             Anesthesia Physical Anesthesia Plan  ASA: III  Anesthesia Plan: Spinal   Post-op Pain Management:  Regional for Post-op pain   Induction:   PONV Risk Score and Plan: 1 and Ondansetron  Airway Management Planned: Simple Face Mask  Additional Equipment:   Intra-op Plan:   Post-operative Plan:   Informed Consent: I have reviewed the patients History and Physical, chart, labs and discussed the procedure including the risks, benefits and alternatives for the proposed anesthesia with the patient or authorized representative who has indicated his/her understanding and acceptance.     Plan Discussed with: CRNA and Surgeon  Anesthesia Plan Comments:         Anesthesia Quick Evaluation

## 2018-04-18 NOTE — Anesthesia Procedure Notes (Signed)
Spinal  Patient location during procedure: OR End time: 04/18/2018 1:59 PM Staffing Resident/CRNA: Noralyn Pick D, CRNA Performed: anesthesiologist and resident/CRNA  Preanesthetic Checklist Completed: patient identified, site marked, surgical consent, pre-op evaluation, timeout performed, IV checked, risks and benefits discussed and monitors and equipment checked Spinal Block Patient position: sitting Prep: Betadine Patient monitoring: heart rate, continuous pulse ox and blood pressure Approach: midline Location: L3-4 Injection technique: single-shot Needle Needle type: Sprotte  Needle gauge: 24 G Needle length: 9 cm Additional Notes Expiration date of kit checked and confirmed. Patient tolerated procedure well, without complications.

## 2018-04-18 NOTE — Progress Notes (Signed)
AssistedDr. Ossey with left, ultrasound guided, adductor canal block. Side rails up, monitors on throughout procedure. See vital signs in flow sheet. Tolerated Procedure well.  

## 2018-04-18 NOTE — Brief Op Note (Signed)
04/18/2018  3:55 PM  PATIENT:  Jeffery Thomas  67 y.o. male  PRE-OPERATIVE DIAGNOSIS:  Failed Left total knee arthroplasty  POST-OPERATIVE DIAGNOSIS:  Failed Left total knee arthroplasty  PROCEDURE:  Procedure(s) with comments: LEFT TOTAL KNEE REVISION (Left) - Adductor Block  SURGEON:  Surgeon(s) and Role:    Gaynelle Arabian, MD - Primary  PHYSICIAN ASSISTANT:   ASSISTANTS: Molli Barrows, PA-C   ANESTHESIA:   spinal  EBL:  25 mL   BLOOD ADMINISTERED:none  DRAINS: (Medium) Hemovact drain(s) in the right knee with  Suction Open   LOCAL MEDICATIONS USED:  OTHER Exparel  COUNTS:  YES  TOURNIQUET:   Total Tourniquet Time Documented: Thigh (Left) - 53 minutes Thigh (Left) - 23 minutes Total: Thigh (Left) - 76 minutes   DICTATION: .Other Dictation: Dictation Number [id number]  PLAN OF CARE: Admit to inpatient   PATIENT DISPOSITION:  PACU - hemodynamically stable.

## 2018-04-19 ENCOUNTER — Encounter (HOSPITAL_COMMUNITY): Payer: Self-pay | Admitting: Orthopedic Surgery

## 2018-04-19 LAB — CBC
HCT: 41.3 % (ref 39.0–52.0)
Hemoglobin: 13.5 g/dL (ref 13.0–17.0)
MCH: 29 pg (ref 26.0–34.0)
MCHC: 32.7 g/dL (ref 30.0–36.0)
MCV: 88.6 fL (ref 78.0–100.0)
Platelets: 222 10*3/uL (ref 150–400)
RBC: 4.66 MIL/uL (ref 4.22–5.81)
RDW: 13.1 % (ref 11.5–15.5)
WBC: 15.2 10*3/uL — ABNORMAL HIGH (ref 4.0–10.5)

## 2018-04-19 LAB — BASIC METABOLIC PANEL
Anion gap: 9 (ref 5–15)
BUN: 16 mg/dL (ref 6–20)
CALCIUM: 8.6 mg/dL — AB (ref 8.9–10.3)
CO2: 28 mmol/L (ref 22–32)
CREATININE: 1 mg/dL (ref 0.61–1.24)
Chloride: 101 mmol/L (ref 101–111)
GFR calc Af Amer: >60 mL/min (ref >60)
GLUCOSE: 142 mg/dL — AB (ref 65–99)
Potassium: 4.6 mmol/L (ref 3.5–5.1)
SODIUM: 138 mmol/L (ref 135–145)

## 2018-04-19 MED ORDER — METHOCARBAMOL 500 MG PO TABS: 500.0000 mg | ORAL_TABLET | Freq: Four times a day (QID) | ORAL | 0 refills | Status: DC | PRN

## 2018-04-19 MED ORDER — RIVAROXABAN 10 MG PO TABS: 10.0000 mg | ORAL_TABLET | Freq: Every day | ORAL | 0 refills | Status: DC

## 2018-04-19 NOTE — Progress Notes (Signed)
Physical Therapy Treatment Patient Details Name: Jeffery Thomas MRN: [id number] DOB: [birth year] Today's Date: 04/19/2018    History of Present Illness Pt s/p L TKR revision and with hx of MI last year    PT Comments    Pt continues motivated and progressing steadily with mobility.  Pt hopeful for return home tomorrow.   Follow Up Recommendations  Follow surgeon's recommendation for DC plan and follow-up therapies     Equipment Recommendations  Rolling walker with 5" wheels;3in1 (PT)    Recommendations for Other Services       Precautions / Restrictions Precautions Precautions: Knee;Fall Required Braces or Orthoses: Knee Immobilizer - Left Knee Immobilizer - Left: Discontinue once straight leg raise with < 10 degree lag Restrictions Weight Bearing Restrictions: No Other Position/Activity Restrictions: WBAT    Mobility  Bed Mobility               General bed mobility comments: Up in chair and requests back to same  Transfers Overall transfer level: Needs assistance Equipment used: Rolling walker (2 wheeled) Transfers: Sit to/from Stand Sit to Stand: Min assist;Min guard         General transfer comment: cues for LE management and use of UEs to self assist  Ambulation/Gait Ambulation/Gait assistance: Min assist;Min guard Gait Distance (Feet): 140 Feet Assistive device: Rolling walker (2 wheeled) Gait Pattern/deviations: Step-to pattern;Step-through pattern;Shuffle;Trunk flexed;Decreased step length - right;Decreased step length - left Gait velocity: decr   General Gait Details: cues for sequence, posture and position from [employer]             Wheelchair Mobility    Modified Rankin (Stroke Patients Only)       Balance                                            Cognition Arousal/Alertness: Awake/alert Behavior During Therapy: WFL for tasks assessed/performed Overall Cognitive Status: Within Functional Limits for  tasks assessed                                        Exercises Total Joint Exercises Ankle Circles/Pumps: AROM;Both;20 reps;Supine Quad Sets: AROM;Both;10 reps;Supine Heel Slides: AAROM;Left;15 reps;Supine Straight Leg Raises: AAROM;Left;10 reps;Supine    General Comments        Pertinent Vitals/Pain Pain Assessment: 0-10 Pain Score: 5  Pain Location: L knee Pain Descriptors / Indicators: Aching;Sore Pain Intervention(s): Limited activity within patient's tolerance;Monitored during session;Premedicated before session;Ice applied    Home Living                      Prior Function            PT Goals (current goals can now be found in the care plan section) Acute Rehab PT Goals Patient Stated Goal: Regain IND PT Goal Formulation: With patient Time For Goal Achievement: 04/26/18 Potential to Achieve Goals: Good Progress towards PT goals: Progressing toward goals    Frequency    7X/week      PT Plan Current plan remains appropriate    Co-evaluation              AM-PAC PT "6 Clicks" Daily Activity  Outcome Measure  Difficulty turning over in bed (including adjusting bedclothes, sheets and blankets)?: Unable  Difficulty moving from lying on back to sitting on the side of the bed? : Unable Difficulty sitting down on and standing up from a chair with arms (e.g., wheelchair, bedside commode, etc,.)?: Unable Help needed moving to and from a bed to chair (including a wheelchair)?: A Little Help needed walking in hospital room?: A Little Help needed climbing 3-5 steps with a railing? : A Little 6 Click Score: 12    End of Session Equipment Utilized During Treatment: Gait belt;Left knee immobilizer Activity Tolerance: Patient tolerated treatment well Patient left: in chair;with call bell/phone within reach Nurse Communication: Mobility status PT Visit Diagnosis: Difficulty in walking, not elsewhere classified (R26.2)     Time:  4163-8453 PT Time Calculation (min) (ACUTE ONLY): 27 min  Charges:  $Gait Training: 8-22 mins $Therapeutic Exercise: 8-22 mins                    G Codes:       Pg [phone]    Lettie Czarnecki 04/19/2018, 5:16 PM

## 2018-04-19 NOTE — Discharge Instructions (Signed)
° °Dr. Frank Aluisio °Total Joint Specialist °[facility] °[address] °[place], [place] [address] °[phone] ° °TOTAL KNEE REPLACEMENT POSTOPERATIVE DIRECTIONS ° °Knee Rehabilitation, Guidelines Following Surgery  °Results after knee surgery are often greatly improved when you follow the exercise, range of motion and muscle strengthening exercises prescribed by your doctor. Safety measures are also important to protect the knee from further injury. Any time any of these exercises cause you to have increased pain or swelling in your knee joint, decrease the amount until you are comfortable again and slowly increase them. If you have problems or questions, call your caregiver or physical therapist for advice.  ° °HOME CARE INSTRUCTIONS  °Remove items at home which could result in a fall. This includes throw rugs or furniture in walking pathways.  °· ICE to the affected knee every three hours for 30 minutes at a time and then as needed for pain and swelling.  Continue to use ice on the knee for pain and swelling from surgery. You may notice swelling that will progress down to the foot and ankle.  This is normal after surgery.  Elevate the leg when you are not up walking on it.   °· Continue to use the breathing machine which will help keep your temperature down.  It is common for your temperature to cycle up and down following surgery, especially at night when you are not up moving around and exerting yourself.  The breathing machine keeps your lungs expanded and your temperature down. °· Do not place pillow under knee, focus on keeping the knee straight while resting ° °DIET °You may resume your previous home diet once your are discharged from the hospital. ° °DRESSING / WOUND CARE / SHOWERING °You may change your dressing every day with sterile gauze.  Please use good hand washing techniques before changing the dressing.  Do not use any lotions or creams on the incision until instructed by your  surgeon. °You may start showering once you are discharged home but do not submerge the incision under water. Just pat the incision dry and apply a dry gauze dressing on daily. °Change the surgical dressing daily and reapply a dry dressing each time. ° °ACTIVITY °Walk with your walker as instructed. °Use walker as long as suggested by your caregivers. °Avoid periods of inactivity such as sitting longer than an hour when not asleep. This helps prevent blood clots.  °You may resume a sexual relationship in one month or when given the OK by your doctor.  °You may return to work once you are cleared by your doctor.  °Do not drive a car for 6 weeks or until released by you surgeon.  °Do not drive while taking narcotics. ° °WEIGHT BEARING °Weight bearing as tolerated with assist device (walker, cane, etc) as directed, use it as long as suggested by your surgeon or therapist, typically at least 4-6 weeks. ° °POSTOPERATIVE CONSTIPATION PROTOCOL °Constipation - defined medically as fewer than three stools per week and severe constipation as less than one stool per week. ° °One of the most common issues patients have following surgery is constipation.  Even if you have a regular bowel pattern at home, your normal regimen is likely to be disrupted due to multiple reasons following surgery.  Combination of anesthesia, postoperative narcotics, change in appetite and fluid intake all can affect your bowels.  In order to avoid complications following surgery, here are some recommendations in order to help you during your recovery period. ° °  Colace (docusate) - Pick up an over-the-counter form of Colace or another stool softener and take twice a day as long as you are requiring postoperative pain medications.  Take with a full glass of water daily.  If you experience loose stools or diarrhea, hold the colace until you stool forms back up.  If your symptoms do not get better within 1 week or if they get worse, check with your  doctor.  Dulcolax (bisacodyl) - Pick up over-the-counter and take as directed by the product packaging as needed to assist with the movement of your bowels.  Take with a full glass of water.  Use this product as needed if not relieved by Colace only.   MiraLax (polyethylene glycol) - Pick up over-the-counter to have on hand.  MiraLax is a solution that will increase the amount of water in your bowels to assist with bowel movements.  Take as directed and can mix with a glass of water, juice, soda, coffee, or tea.  Take if you go more than two days without a movement. Do not use MiraLax more than once per day. Call your doctor if you are still constipated or irregular after using this medication for 7 days in a row.  If you continue to have problems with postoperative constipation, please contact the office for further assistance and recommendations.  If you experience "the worst abdominal pain ever" or develop nausea or vomiting, please contact the office immediatly for further recommendations for treatment.  ITCHING  If you experience itching with your medications, try taking only a single pain pill, or even half a pain pill at a time.  You can also use Benadryl over the counter for itching or also to help with sleep.   TED HOSE STOCKINGS Wear the elastic stockings on both legs for three weeks following surgery during the day but you may remove then at night for sleeping.  MEDICATIONS See your medication summary on the After Visit Summary that the nursing staff will review with you prior to discharge.  You may have some home medications which will be placed on hold until you complete the course of blood thinner medication.  It is important for you to complete the blood thinner medication as prescribed by your surgeon.  Continue your approved medications as instructed at time of discharge.  Gabapentin 300 mg Protocol Take a 300 mg capsule three times a day for two weeks, Then a 300 mg capsule  twice a day for two weeks, Then a 300 mg capsule once a day for two weeks, then discontinue the Gabapentin.  PRECAUTIONS If you experience chest pain or shortness of breath - call 911 immediately for transfer to the hospital emergency department.  If you develop a fever greater that 101 F, purulent drainage from wound, increased redness or drainage from wound, foul odor from the wound/dressing, or calf pain - CONTACT YOUR SURGEON.                                                   FOLLOW-UP APPOINTMENTS Make sure you keep all of your appointments after your operation with your surgeon and caregivers. You should call the office at the above phone number and make an appointment for approximately two weeks after the date of your surgery or on the date instructed by your surgeon outlined in the "After  Visit Summary".   RANGE OF MOTION AND STRENGTHENING EXERCISES  Rehabilitation of the knee is important following a knee injury or an operation. After just a few days of immobilization, the muscles of the thigh which control the knee become weakened and shrink (atrophy). Knee exercises are designed to build up the tone and strength of the thigh muscles and to improve knee motion. Often times heat used for twenty to thirty minutes before working out will loosen up your tissues and help with improving the range of motion but do not use heat for the first two weeks following surgery. These exercises can be done on a training (exercise) mat, on the floor, on a table or on a bed. Use what ever works the best and is most comfortable for you Knee exercises include:  Leg Lifts - While your knee is still immobilized in a splint or cast, you can do straight leg raises. Lift the leg to 60 degrees, hold for 3 sec, and slowly lower the leg. Repeat 10-20 times 2-3 times daily. Perform this exercise against resistance later as your knee gets better.  Quad and Hamstring Sets - Tighten up the muscle on the front of the thigh  (Quad) and hold for 5-10 sec. Repeat this 10-20 times hourly. Hamstring sets are done by pushing the foot backward against an object and holding for 5-10 sec. Repeat as with quad sets.   Leg Slides: Lying on your back, slowly slide your foot toward your buttocks, bending your knee up off the floor (only go as far as is comfortable). Then slowly slide your foot back down until your leg is flat on the floor again.  Angel Wings: Lying on your back spread your legs to the side as far apart as you can without causing discomfort.  A rehabilitation program following serious knee injuries can speed recovery and prevent re-injury in the future due to weakened muscles. Contact your doctor or a physical therapist for more information on knee rehabilitation.   IF YOU ARE TRANSFERRED TO A SKILLED REHAB FACILITY If the patient is transferred to a skilled rehab facility following release from the hospital, a list of the current medications will be sent to the facility for the patient to continue.  When discharged from the skilled rehab facility, please have the facility set up the patient's [place] prior to being released. Also, the skilled facility will be responsible for providing the patient with their medications at time of release from the facility to include their pain medication, the muscle relaxants, and their blood thinner medication. If the patient is still at the rehab facility at time of the two week follow up appointment, the skilled rehab facility will also need to assist the patient in arranging follow up appointment in our office and any transportation needs.  MAKE SURE YOU:  Understand these instructions.  Get help right away if you are not doing well or get worse.    Pick up stool softner and laxative for home use following surgery while on pain medications. Do not submerge incision under water. Please use good hand washing techniques while changing dressing each day. May  shower starting three days after surgery. Please use a clean towel to pat the incision dry following showers. Continue to use ice for pain and swelling after surgery. Do not use any lotions or creams on the incision until instructed by your surgeon.  Information on my medicine - XARELTO (Rivaroxaban)  Why was Xarelto prescribed for you?  Xarelto was prescribed for you to reduce the risk of blood clots forming after orthopedic surgery. The medical term for these abnormal blood clots is venous thromboembolism (VTE).  What do you need to know about xarelto ? Take your Xarelto ONCE DAILY at the same time every day. You may take it either with or without food.  If you have difficulty swallowing the tablet whole, you may crush it and mix in applesauce just prior to taking your dose.  Take Xarelto exactly as prescribed by your doctor and DO NOT stop taking Xarelto without talking to the doctor who prescribed the medication.  Stopping without other VTE prevention medication to take the place of Xarelto may increase your risk of developing a clot.  After discharge, you should have regular check-up appointments with your healthcare provider that is prescribing your Xarelto.    What do you do if you miss a dose? If you miss a dose, take it as soon as you remember on the same day then continue your regularly scheduled once daily regimen the next day. Do not take two doses of Xarelto on the same day.   Important Safety Information A possible side effect of Xarelto is bleeding. You should call your healthcare provider right away if you experience any of the following: ? Bleeding from an injury or your nose that does not stop. ? Unusual colored urine (red or dark brown) or unusual colored stools (red or black). ? Unusual bruising for unknown reasons. ? A serious fall or if you hit your head (even if there is no bleeding).  Some medicines may interact with Xarelto and might increase your  risk of bleeding while on Xarelto. To help avoid this, consult your healthcare provider or pharmacist prior to using any new prescription or non-prescription medications, including herbals, vitamins, non-steroidal anti-inflammatory drugs (NSAIDs) and supplements.  This website has more information on Xarelto: [email or web address]

## 2018-04-19 NOTE — Progress Notes (Signed)
Spoke with patient at bedside. Confirmed plan for OP PT, already arranged. Needs a RW, contacted [facility] to deliver to the room. [phone]

## 2018-04-19 NOTE — Op Note (Signed)
NAME: Jeffery Thomas, Jeffery Thomas MEDICAL RECORD QM:[id number] ACCOUNT [id number] DATE OF BIRTH:29-Nov-1950 FACILITY: [facility] LOCATION: [facility] PHYSICIAN:Sol Englert Zella Ball, MD  OPERATIVE REPORT  DATE OF PROCEDURE:  04/18/2018  PREOPERATIVE DIAGNOSIS:  Failed loose left total knee arthroplasty.  POSTOPERATIVE DIAGNOSIS:  Failed loose left total knee arthroplasty.  PROCEDURE:  Left total knee arthroplasty revision.  SURGEON:  Gaynelle Arabian, MD.  ASSISTANT:  Arletta Bale, PA-C.  ANESTHESIA:  Spinal.  ESTIMATED BLOOD LOSS:  25 mL.    DRAINS:  Hemovac x1.  TOURNIQUET TIME:  Up 53 minutes at 300 mmHg;  down 8 minutes; up additional 22 minutes at 300 mmHg.  COMPLICATIONS:  None.  CONDITION:  Stable to recovery.  BRIEF CLINICAL NOTE:  The patient is a 68 year old male who had a total knee arthroplasty done elsewhere several years ago.  He has had progressively worsening pain and dysfunction.  An x-ray showed loosening of at least the tibial and possibly the  femoral component.  He presents now for total knee arthroplasty revision.  DESCRIPTION OF  FINDINGS:  After successful administration of spinal anesthetic, a tourniquet was placed high on the left thigh the left lower extremity was prepped and draped in the usual sterile fashion.  Extremity was wrapped in Esmarch and tourniquet  inflated to 300 mmHg.  A midline incision was made with a 10 blade through subcutaneous tissue to the level of the extensor mechanism.  Note that he only had about 80 degrees of flexion preoperatively.  A fresh blade was used to make a medial  parapatellar arthrotomy and the soft tissue over the proximal medial tibia subperiosteally elevated to the joint line with a knife and into the semimembranosus bursa with a Cobb elevator.  I did a thorough synovectomy and removed the scar tissue.  We  then did a quadriceps snip in order to allow the patella to evert.  The knee was then flexed 90 degrees.  A tibial polyethylene was  removed from the tibial tray.  The tibia was then subluxed forward and we easily removed the tibial tray, which had  migrated into a flexed position.  I then removed the cement from the proximal tibia.  The extramedullary tibial alignment guide is placed referencing proximally at the medial aspect of the tibial tubercle and distally along the second metatarsal axis and  tibial crest.  The block was pinned to remove about 3-4 mm off the cut bone surface.  I had to do it in about 4 degrees of posterior slope, which effectively got to the back of the defect posteriorly.  Size 5 was the most appropriate tibial component.   The proximal tibia was then prepared with the modular drill and the modular drill plus sleeve for the size 5.  We prepared proximally with broaching for a 29 mm sleeve.  Distally, we had already reamed up to 13 mm to prepare for the stem.  I then placed  a cement restrictor which was size 5 at the appropriate depth and a tibial canal.  The femur was then addressed.  The interface between the femoral component and bone was disrupted using osteotomes.  The femoral component was easily removed with minimal bone loss.  There is some osteolytic debris and a small osteolytic cyst in the  femur and that is removed with a rongeur.  We then used a drill to create a starting hole in the distal femur and the canal is thoroughly irrigated.  I reamed the canal up to 22 mm, which had an  excellent press fit and then a 22 mm reamer served as our  intramedullary cutting guide.  Distal femoral cutting block was placed on that and I removed minimal bone medially and had to go up 4 mm laterally, thus a 4 mm distal lateral augment was necessary.  Size 5 was also the most appropriate femoral size.  The  size 5 cutting block was placed and rotation was marked off the epicondylar axis as well as confirmed by creating a rectangular flexion gap.  Block was pinned in this position.  Anterior, posterior chamfer cuts  were made.  Posteriorly, I had to do an 8  mm augment posterolateral and a 4 mm augment posteromedial.  We then removed that block placed the intercondylar block and intercondylar cuts made for the TC3 component.  Trials were placed on the tibial size.  A size 5 MBT revision tray with 5 mm medial and lateral augments, a 29 mm sleeve and a 13 x 30 stem extension.  On the femoral side, it is a size 5 TC3 femur with a 22 x 75 stem in the +2 position with 5 degrees of  valgus distally.  There is a 4 mm augment laterally.  Posteriorly, there is a 4 mm augment medially and an 8 mm augment laterally.  The trials were placed with excellent fit.  With a 12.5 insert, full extension was achieved with excellent varus  varus/valgus and anterior/posterior balance throughout full range of motion.  Patella was again everted and the components is in good shape, so we left that and just did a patelloplasty removing all the soft tissue and bone from around the patellar  component and that tissue also which had overgrown the patellar component.  The components were then assembled on the back table and a tourniquet was released for initial tourniquet time of 53 minutes.  It was kept down for 8 minutes while the components were assembled on the back table.  Minor bleeding has stopped with  electrocautery.  After 8 minutes the leg was wrapped in Esmarch and tourniquet reinflated to 300 mmHg.  The component assembly is completed on the back table.  Three batches of gentamicin impregnated cement were mixed and once ready for implantation.  On  the tibial side, we cemented the stem and component, which is a size 5 MBT revision tray with 5 mm medial and lateral augments and 29 mm sleeve.  This is cemented into the proximal tibia and all extruded cement removed.  On the femoral side, we cemented  distally with the Press-fit stem.  Again, it is a size 5 TC3 femur with 4 mm distal lateral augment, 4 mm posteromedial augment, 8 mm  posterolateral augment and a 22 x 75 stem in the +2 position, 5 degrees of valgus.  A trial 12.5 insert was placed and  the knee held in full extension.  All extruded cement removed.  When the cement fully hardened,  a permanent 12.5 mm posterior stabilized TC3 rotating platform insert was placed into the tibial tray.  The knee was reduced with the same stability  parameters.  The wound was copiously irrigated with saline solution.  Twenty mL of Exparel mixed with 60 mL of saline were injected into the posterior capsule of the retinacular structures, the extensor mechanism and the subcu tissues.  The arthrotomy  was then closed as well as the quadriceps snip closed over a Hemovac drain.    Tourniquet was released for a total tourniquet time of about 75  minutes.  Subcutaneous was then closed with interrupted 2-0 Vicryl over a subcutaneous drain.  Skin is then closed with staples.  Incisions were cleaned and dried and a bulky sterile  dressing is applied.  He was then placed into a knee immobilizer, awakened and transported to recovery room in stable condition.    Please note that a surgical assistant was a medical necessity for this procedure to do it in a safe and expeditious manner.  Surgical assistant was necessary for retraction of vital ligaments and neurovascular structures and for proper positioning of the  limb for safe removal of the old implants and for safe and accurate placement of the new implants.  AN/NUANCE  D:04/18/2018 T:04/19/2018 JOB:[id number]

## 2018-04-19 NOTE — Evaluation (Signed)
Physical Therapy Evaluation Patient Details Name: Jeffery Thomas MRN: [id number] DOB: 04-16-1951 Today's Date: 04/19/2018   History of Present Illness  Pt s/p L TKR revision and with hx of MI last year  Clinical Impression  Pt s/p L TKR revision and presents with decreased L LE strength/ROM and post op pain limiting functional mobility.  Pt should progress to dc home with family assist and plans to start OP PT 04/23/18.    Follow Up Recommendations Follow surgeon's recommendation for DC plan and follow-up therapies    Equipment Recommendations  Rolling walker with 5" wheels;3in1 (PT)    Recommendations for Other Services       Precautions / Restrictions Precautions Precautions: Knee;Fall Required Braces or Orthoses: Knee Immobilizer - Left Knee Immobilizer - Left: Discontinue once straight leg raise with < 10 degree lag Restrictions Weight Bearing Restrictions: No Other Position/Activity Restrictions: WBAT      Mobility  Bed Mobility Overal bed mobility: Needs Assistance Bed Mobility: Supine to Sit     Supine to sit: Min assist     General bed mobility comments: cues for seqeunce and use of R LE to self assist  Transfers Overall transfer level: Needs assistance Equipment used: Rolling walker (2 wheeled) Transfers: Sit to/from Stand Sit to Stand: Min assist         General transfer comment: cues for LE management and use of UEs to self assist  Ambulation/Gait Ambulation/Gait assistance: Min assist Gait Distance (Feet): 110 Feet Assistive device: Rolling walker (2 wheeled) Gait Pattern/deviations: Step-to pattern;Step-through pattern;Shuffle;Trunk flexed;Decreased step length - right;Decreased step length - left Gait velocity: decr   General Gait Details: cues for sequence, posture and position from [employer]            Wheelchair Mobility    Modified Rankin (Stroke Patients Only)       Balance                                             Pertinent Vitals/Pain Pain Assessment: 0-10 Pain Score: 5  Pain Location: L knee Pain Descriptors / Indicators: Aching;Sore Pain Intervention(s): Limited activity within patient's tolerance;Monitored during session;Premedicated before session;Ice applied    Home Living Family/patient expects to be discharged to:: Private residence Living Arrangements: Spouse/significant other Available Help at Discharge: Family;Available PRN/intermittently Type of Home: House Home Access: Stairs to enter Entrance Stairs-Rails: None Entrance Stairs-Number of Steps: 1 Home Layout: One level;Laundry or work area in [employer]: None      Prior Function Level of Independence: Independent         Comments: Engineer, building services Dominance   Dominant Hand: Right    Extremity/Trunk Assessment   Upper Extremity Assessment Upper Extremity Assessment: Overall WFL for tasks assessed    Lower Extremity Assessment Lower Extremity Assessment: LLE deficits/detail LLE Deficits / Details: 2+/5 quads with AAROM at knee -15 - 45    Cervical / Trunk Assessment Cervical / Trunk Assessment: Normal  Communication   Communication: No difficulties  Cognition Arousal/Alertness: Awake/alert Behavior During Therapy: WFL for tasks assessed/performed Overall Cognitive Status: Within Functional Limits for tasks assessed                                        General Comments  Exercises Total Joint Exercises Ankle Circles/Pumps: AROM;Both;20 reps;Supine Quad Sets: AROM;Both;10 reps;Supine Heel Slides: AAROM;Left;15 reps;Supine Straight Leg Raises: AAROM;Left;10 reps;Supine   Assessment/Plan    PT Assessment Patient needs continued PT services  PT Problem List Decreased strength;Decreased range of motion;Decreased activity tolerance;Decreased mobility;Decreased knowledge of use of DME;Pain       PT Treatment Interventions DME instruction;Gait training;Stair  training;Functional mobility training;Therapeutic activities;Therapeutic exercise;Patient/family education    PT Goals (Current goals can be found in the Care Plan section)  Acute Rehab PT Goals Patient Stated Goal: Regain IND PT Goal Formulation: With patient Time For Goal Achievement: 04/26/18 Potential to Achieve Goals: Good    Frequency 7X/week   Barriers to discharge        Co-evaluation               AM-PAC PT "6 Clicks" Daily Activity  Outcome Measure Difficulty turning over in bed (including adjusting bedclothes, sheets and blankets)?: Unable Difficulty moving from lying on back to sitting on the side of the bed? : Unable Difficulty sitting down on and standing up from a chair with arms (e.g., wheelchair, bedside commode, etc,.)?: Unable Help needed moving to and from a bed to chair (including a wheelchair)?: A Little Help needed walking in hospital room?: A Little Help needed climbing 3-5 steps with a railing? : A Little 6 Click Score: 12    End of Session Equipment Utilized During Treatment: Gait belt;Left knee immobilizer Activity Tolerance: Patient tolerated treatment well Patient left: in chair;with call bell/phone within reach Nurse Communication: Mobility status PT Visit Diagnosis: Difficulty in walking, not elsewhere classified (R26.2)    Time: 0950-1030 PT Time Calculation (min) (ACUTE ONLY): 40 min   Charges:   PT Evaluation $PT Eval Low Complexity: 1 Low PT Treatments $Gait Training: 8-22 mins $Therapeutic Exercise: 8-22 mins   PT G Codes:        Pg [phone]   Paz Fuentes 04/19/2018, 1:04 PM

## 2018-04-19 NOTE — Progress Notes (Signed)
   Subjective: 1 Day Post-Op Procedure(s) (LRB): LEFT TOTAL KNEE REVISION (Left) Patient reports pain as mild.   Patient seen in rounds with Dr. Wynelle Link. Patient is well, and has had no acute complaints or problems. No issues overnight. No SOB or chest pain.  We will start therapy today.  Plan is to go Home after hospital stay.  Objective: Vital signs in last 24 hours: Temp:  [97.5 F (36.4 C)-98.8 F (37.1 C)] 97.6 F (36.4 C) (06/13 0636) Pulse Rate:  [45-71] 71 (06/13 0636) Resp:  [11-24] 15 (06/13 0636) BP: (121-180)/(68-119) 129/82 (06/13 0636) SpO2:  [94 %-100 %] 94 % (06/13 0636) Weight:  [97.1 kg (214 lb)] 97.1 kg (214 lb) (06/12 1208)  Intake/Output from previous day:  Intake/Output Summary (Last 24 hours) at 04/19/2018 0726 Last data filed at 04/19/2018 0200 Gross per 24 hour  Intake 3008.66 ml  Output 765 ml  Net 2243.66 ml     Labs: Recent Labs    04/19/18 0539  HGB 13.5   Recent Labs    04/19/18 0539  WBC 15.2*  RBC 4.66  HCT 41.3  PLT 222   Recent Labs    04/19/18 0539  NA 138  K 4.6  CL 101  CO2 28  BUN 16  CREATININE 1.00  GLUCOSE 142*  CALCIUM 8.6*    EXAM General - Patient is Alert and Oriented Extremity - Neurologically intact Intact pulses distally Dorsiflexion/Plantar flexion intact No cellulitis present Compartment soft Dressing - dressing C/D/I Motor Function - intact, moving foot and toes well on exam.  Both hemovacs pulled without difficulty.  Past Medical History:  Diagnosis Date  . Abdominal distention   . Acute encephalopathy   . Acute respiratory failure with hypoxia ([place])   . Acute systolic congestive heart failure ([place])   . Agitation   . 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 Benign essential HTN   . Bradycardia   . Cardiac device in situ   . Encounter for central line placement   . Encounter for imaging study to confirm orogastric (OG) tube placement   . Gastroesophageal reflux disease   .  HCAP (healthcare-associated pneumonia)   . History of cardiac arrest   . Hypertension   . Leukocytosis   . Prediabetes   . ST elevation myocardial infarction (STEMI) ([place])   . Streptococcal sore throat   . 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 Takatsuki syndrome   . Ventricular fibrillation ([place]) 09-Jul-2017    Assessment/Plan: 1 Day Post-Op Procedure(s) (LRB): LEFT TOTAL KNEE REVISION (Left) Principal Problem:   Failed total knee arthroplasty ([place])  Estimated body mass index is 29.02 kg/m as calculated from the following:   Height as of this encounter: 6' (1.829 m).   Weight as of this encounter: 97.1 kg (214 lb). Advance diet Up with therapy D/C IV fluids  When tolerating POs well  DVT Prophylaxis - Xarelto Weight-Bearing as tolerated  D/C O2 and Pulse OX and try on Room Air  Will start working with PT today. Plan for DC home tomorrow with outpatient therapy at [facility] pending progress and meeting his goals.    Ardeen Jourdain, PA-C Orthopaedic Surgery 04/19/2018, 7:26 AM

## 2018-04-19 NOTE — Plan of Care (Signed)
  Problem: Clinical Measurements: °Goal: Ability to maintain clinical measurements within normal limits will improve °Outcome: Progressing °  °Problem: Clinical Measurements: °Goal: Will remain free from infection °Outcome: Progressing °  °Problem: Nutrition: °Goal: Adequate nutrition will be maintained °Outcome: Progressing °  °Problem: Elimination: °Goal: Will not experience complications related to bowel motility °Outcome: Progressing °  °

## 2018-04-20 LAB — BASIC METABOLIC PANEL
ANION GAP: 7 (ref 5–15)
BUN: 19 mg/dL (ref 6–20)
CHLORIDE: 101 mmol/L (ref 101–111)
CO2: 32 mmol/L (ref 22–32)
Calcium: 9 mg/dL (ref 8.9–10.3)
Creatinine, Ser: 1 mg/dL (ref 0.61–1.24)
GFR calc Af Amer: >60 mL/min (ref >60)
GLUCOSE: 159 mg/dL — AB (ref 65–99)
POTASSIUM: 3.7 mmol/L (ref 3.5–5.1)
Sodium: 140 mmol/L (ref 135–145)

## 2018-04-20 LAB — CBC
HCT: 36.4 % — ABNORMAL LOW (ref 39.0–52.0)
HEMOGLOBIN: 11.9 g/dL — AB (ref 13.0–17.0)
MCH: 29.2 pg (ref 26.0–34.0)
MCHC: 32.7 g/dL (ref 30.0–36.0)
MCV: 89.4 fL (ref 78.0–100.0)
PLATELETS: 234 10*3/uL (ref 150–400)
RBC: 4.07 MIL/uL — AB (ref 4.22–5.81)
RDW: 13.3 % (ref 11.5–15.5)
WBC: 11.3 10*3/uL — AB (ref 4.0–10.5)

## 2018-04-20 MED ORDER — OXYCODONE HCL 5 MG PO TABS: 5.0000 mg | ORAL_TABLET | ORAL | 0 refills | Status: DC | PRN

## 2018-04-20 MED ORDER — TRAMADOL HCL 50 MG PO TABS: 50.0000 mg | ORAL_TABLET | Freq: Four times a day (QID) | ORAL | 0 refills | Status: DC | PRN

## 2018-04-20 NOTE — Discharge Summary (Signed)
Physician Discharge Summary   Patient ID: CAROLOS FECHER MRN: [id number] DOB/AGE: [birth year] 65 y.o.  Admit date: 04/18/2018 Discharge date: 04/20/2018  Primary Diagnosis: failed left total knee arthroplasty   Admission Diagnoses:  Past Medical History:  Diagnosis Date  . Abdominal distention   . Acute encephalopathy   . Acute respiratory failure with hypoxia ([place])   . Acute systolic congestive heart failure ([place])   . Agitation   . Anoxic-ischemic encephalopathy ([place]) 07/05/2017  . Benign essential HTN   . Bradycardia   . Cardiac device in situ   . Encounter for central line placement   . Encounter for imaging study to confirm orogastric (OG) tube placement   . Gastroesophageal reflux disease   . HCAP (healthcare-associated pneumonia)   . History of cardiac arrest   . Hypertension   . Leukocytosis   . Prediabetes   . ST elevation myocardial infarction (STEMI) ([place])   . Streptococcal sore throat   . 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 Takatsuki syndrome   . Ventricular fibrillation ([place]) July 26, 2017   Discharge Diagnoses:   Principal Problem:   Failed total knee arthroplasty ([place])  Estimated body mass index is 29.02 kg/m as calculated from the following:   Height as of this encounter: 6' (1.829 m).   Weight as of this encounter: 97.1 kg (214 lb).  Procedure:  Procedure(s) (LRB): LEFT TOTAL KNEE REVISION (Left)   Consults: None  HPI: The patient is a 67 year old male who had a total knee arthroplasty done elsewhere several years ago.  He has had progressively worsening pain and dysfunction.  An x-ray showed loosening of at least the tibial and possibly the  femoral component.  He presents now for total knee arthroplasty revision.  Laboratory Data: Admission on 04/18/2018, Discharged on 04/20/2018  Component Date Value Ref Range Status  . WBC 04/19/2018 15.2* 4.0 - 10.5 K/uL Final  . RBC 04/19/2018 4.66  4.22 - 5.81  MIL/uL Final  . Hemoglobin 04/19/2018 13.5  13.0 - 17.0 g/dL Final  . HCT 04/19/2018 41.3  39.0 - 52.0 % Final  . MCV 04/19/2018 88.6  78.0 - 100.0 fL Final  . MCH 04/19/2018 29.0  26.0 - 34.0 pg Final  . MCHC 04/19/2018 32.7  30.0 - 36.0 g/dL Final  . RDW 04/19/2018 13.1  11.5 - 15.5 % Final  . Platelets 04/19/2018 222  150 - 400 K/uL Final   Performed at [facility], [place] [address], [place], [place] [address]  . Sodium 04/19/2018 138  135 - 145 mmol/L Final  . Potassium 04/19/2018 4.6  3.5 - 5.1 mmol/L Final  . Chloride 04/19/2018 101  101 - 111 mmol/L Final  . CO2 04/19/2018 28  22 - 32 mmol/L Final  . Glucose, Bld 04/19/2018 142* 65 - 99 mg/dL Final  . BUN 04/19/2018 16  6 - 20 mg/dL Final  . Creatinine, Ser 04/19/2018 1.00  0.61 - 1.24 mg/dL Final  . Calcium 04/19/2018 8.6* 8.9 - 10.3 mg/dL Final  . GFR calc non Af Amer 04/19/2018 >60  >60 mL/min Final  . GFR calc Af Amer 04/19/2018 >60  >60 mL/min Final   Comment: (NOTE) The eGFR has been calculated using the CKD EPI equation. This calculation has not been validated in all clinical situations. eGFR's persistently <60 mL/min signify possible Chronic Kidney Disease.   Georgiann Hahn gap 04/19/2018 9  5 - 15 Final   Performed at [facility], [place] [address], [place], [place] [address]  . WBC 04/20/2018 11.3* 4.0 - 10.5 K/uL Final  . RBC 04/20/2018 4.07* 4.22 - 5.81 MIL/uL Final  . Hemoglobin 04/20/2018 11.9* 13.0 - 17.0 g/dL Final  . HCT 04/20/2018 36.4* 39.0 - 52.0 % Final  . MCV 04/20/2018 89.4  78.0 - 100.0 fL Final  . MCH 04/20/2018 29.2  26.0 - 34.0 pg Final  . MCHC 04/20/2018 32.7  30.0 - 36.0 g/dL Final  . RDW 04/20/2018 13.3  11.5 - 15.5 % Final  . Platelets 04/20/2018 234  150 - 400 K/uL Final   Performed at [facility], [place] [address] [place], [place] [address]  . Sodium 04/20/2018 140  135 - 145 mmol/L Final  . Potassium 04/20/2018 3.7  3.5 - 5.1 mmol/L Corrected    CORRECTED ON 06/14 AT 3300: PREVIOUSLY REPORTED AS 3.8 DELTA CHECK NOTED REPEATED TO VERIFY  . Chloride 04/20/2018 101  101 - 111 mmol/L Final  . CO2 04/20/2018 32  22 - 32 mmol/L Final  . Glucose, Bld 04/20/2018 159* 65 - 99 mg/dL Final  . BUN 04/20/2018 19  6 - 20 mg/dL Final  . Creatinine, Ser 04/20/2018 1.00  0.61 - 1.24 mg/dL Final  . Calcium 04/20/2018 9.0  8.9 - 10.3 mg/dL Final  . GFR calc non Af Amer 04/20/2018 >60  >60 mL/min Final  . GFR calc Af Amer 04/20/2018 >60  >60 mL/min Final   Comment: (NOTE) The eGFR has been calculated using the CKD EPI equation. This calculation has not been validated in all clinical situations. eGFR's persistently <60 mL/min signify possible Chronic Kidney Disease.   Georgiann Hahn gap 04/20/2018 7  5 - 15 Final   Performed at [facility], [place] [address] [place], [place] [address]  Hospital Outpatient Visit on 04/13/2018  Component Date Value Ref Range Status  . aPTT 04/13/2018 27  24 - 36 seconds Final   Performed at [facility], [place] [address], [place], [place] [address]  . WBC 04/13/2018 6.2  4.0 - 10.5 K/uL Final  . RBC 04/13/2018 5.14  4.22 - 5.81 MIL/uL Final  . Hemoglobin 04/13/2018 14.9  13.0 - 17.0 g/dL Final  . HCT 04/13/2018 45.1  39.0 - 52.0 % Final  . MCV 04/13/2018 87.7  78.0 - 100.0 fL Final  . MCH 04/13/2018 29.0  26.0 - 34.0 pg Final  . MCHC 04/13/2018 33.0  30.0 - 36.0 g/dL Final  . RDW 04/13/2018 13.1  11.5 - 15.5 % Final  . Platelets 04/13/2018 254  150 - 400 K/uL Final   Performed at [facility], [place] [address] [place], [place] [address]  . Sodium 04/13/2018 141  135 - 145 mmol/L Final  . Potassium 04/13/2018 4.0  3.5 - 5.1 mmol/L Final  . Chloride 04/13/2018 106  101 - 111 mmol/L Final  . CO2 04/13/2018 27  22 - 32 mmol/L Final  . Glucose, Bld 04/13/2018 104* 65 - 99 mg/dL Final  . BUN 04/13/2018 17  6 - 20 mg/dL Final  . Creatinine, Ser 04/13/2018 1.07  0.61 -  1.24 mg/dL Final  . Calcium 04/13/2018 8.8* 8.9 - 10.3 mg/dL Final  . Total Protein 04/13/2018 6.8  6.5 - 8.1 g/dL Final  . Albumin 04/13/2018 4.2  3.5 - 5.0 g/dL Final  . AST 04/13/2018 21  15 - 41 U/L Final  . ALT 04/13/2018 27  17 - 63 U/L Final  . Alkaline  Phosphatase 04/13/2018 59  38 - 126 U/L Final  . Total Bilirubin 04/13/2018 0.8  0.3 - 1.2 mg/dL Final  . GFR calc non Af Amer 04/13/2018 >60  >60 mL/min Final  . GFR calc Af Amer 04/13/2018 >60  >60 mL/min Final   Comment: (NOTE) The eGFR has been calculated using the CKD EPI equation. This calculation has not been validated in all clinical situations. eGFR's persistently <60 mL/min signify possible Chronic Kidney Disease.   Georgiann Hahn gap 04/13/2018 8  5 - 15 Final   Performed at [facility], [place] [address], [place], [place] [address]  . Prothrombin Time 04/13/2018 13.1  11.4 - 15.2 seconds Final  . INR 04/13/2018 1.00   Final   Performed at [facility], [place] [address] [place], [place] [address]  . ABO/RH(D) 04/13/2018 O POS   Final  . Antibody Screen 04/13/2018 NEG   Final  . Sample Expiration 04/13/2018 04/21/2018   Final  . Extend sample reason 04/13/2018    Final                   Value:NO TRANSFUSIONS OR PREGNANCY IN THE PAST 3 MONTHS Performed at [facility], [place] [address] [place], [place] [address]   . Color, Urine 04/13/2018 STRAW* YELLOW Final  . APPearance 04/13/2018 CLEAR  CLEAR Final  . Specific Gravity, Urine 04/13/2018 1.004* 1.005 - 1.030 Final  . pH 04/13/2018 6.0  5.0 - 8.0 Final  . Glucose, UA 04/13/2018 NEGATIVE  NEGATIVE mg/dL Final  . Hgb urine dipstick 04/13/2018 NEGATIVE  NEGATIVE Final  . Bilirubin Urine 04/13/2018 NEGATIVE  NEGATIVE Final  . Ketones, ur 04/13/2018 NEGATIVE  NEGATIVE mg/dL Final  . Protein, ur 04/13/2018 NEGATIVE  NEGATIVE mg/dL Final  . Nitrite 04/13/2018 NEGATIVE  NEGATIVE Final  . Leukocytes, UA 04/13/2018 NEGATIVE   NEGATIVE Final   Performed at [place] [address] [place], [place] [address]  . MRSA, PCR 04/13/2018 NEGATIVE  NEGATIVE Final  . Staphylococcus aureus 04/13/2018 POSITIVE* NEGATIVE Final   Comment: (NOTE) The Xpert SA Assay (FDA approved for NASAL specimens in patients 30 years of age and older), is one component of a comprehensive surveillance program. It is not intended to diagnose infection nor to guide or monitor treatment. Performed at [facility], [place] [address] [place], [place] [address]   . Hgb A1c MFr Bld 04/13/2018 6.1* 4.8 - 5.6 % Final   Comment: (NOTE) Pre diabetes:          5.7%-6.4% Diabetes:              >6.4% Glycemic control for   <7.0% adults with diabetes   . Mean Plasma Glucose 04/13/2018 128.37  mg/dL Final   Performed at [place] [address] [place], [place] [address]  . ABO/RH(D) 04/13/2018    Final                   Value:O POS Performed at [facility], [place] [address] [place],  [address]      X-Rays:No results found.  EKG: Orders placed or performed in visit on 10/09/17  . EKG 12-Lead     Hospital Course: Jeffery Thomas is a 67 y.o. who was admitted to [facility]. They were brought to the operating room on 04/18/2018 and underwent Procedure(s): LEFT TOTAL KNEE REVISION.  Patient tolerated the procedure well and was later transferred to  the recovery room and then to the orthopaedic floor for postoperative care.  They were given PO and IV analgesics for pain control following their surgery.  They were given 24 hours of postoperative antibiotics of  Anti-infectives (From admission, onward)   Start     Dose/Rate Route Frequency Ordered Stop   04/19/18 0600  ceFAZolin (ANCEF) IVPB 2g/100 mL premix     2 g 200 mL/hr over 30 Minutes Intravenous On call to O.R. 04/18/18 1206 04/18/18 1401   04/18/18 2000  ceFAZolin (ANCEF) IVPB 2g/100 mL premix     2  g 200 mL/hr over 30 Minutes Intravenous Every 6 hours 04/18/18 1744 04/19/18 0320     and started on DVT prophylaxis in the form of Xarelto.   PT and OT were ordered for total joint protocol.  Discharge planning consulted to help with postop disposition and equipment needs.  Patient had a good night on the evening of surgery. He started to get up OOB with therapy on POD #1. Both hemovac drains were pulled without difficulty. Patient was seen on rounds on POD #2 and was ready to go home. Dressing was changed on day two and the incision was clean, dry, and intact without drainage. Patient worked with therapy for two sessions that day, was meeting his goals and was discharged to home with outpatient physical therapy.   In the operating room on day of surgery, patient was noted to have left-sided scrotal edema. Upon further questioning the day following surgery, the patient revealed that he has not seen urology for this problem. Advised patient that following hospital discharge he should follow-up with urology to address this issue, patient expressed understanding.  Diet: Cardiac diet Activity:WBAT Follow-up: in 2 weeks at office with Dr. Wynelle Link Disposition - Home with outpatient therapy at [facility] Discharged Condition: stable  Discharge Instructions    Call MD / Call 911   Complete by:  As directed    If you experience chest pain or shortness of breath, CALL 911 and be transported to the hospital emergency room.  If you develope a fever above 101 F, pus (white drainage) or increased drainage or redness at the wound, or calf pain, call your surgeon's office.   Constipation Prevention   Complete by:  As directed    Drink plenty of fluids.  Prune juice may be helpful.  You may use a stool softener, such as Colace (over the counter) 100 mg twice a day.  Use MiraLax (over the counter) for constipation as needed.   Diet - low sodium heart healthy   Complete by:  As directed    Discharge  instructions   Complete by:  As directed    Dr. Gaynelle Arabian Total Joint Specialist [facility] [address] [place], [place] [address] [phone]  TOTAL KNEE REPLACEMENT POSTOPERATIVE DIRECTIONS  Knee Rehabilitation, Guidelines Following Surgery  Results after knee surgery are often greatly improved when you follow the exercise, range of motion and muscle strengthening exercises prescribed by your doctor. Safety measures are also important to protect the knee from further injury. Any time any of these exercises cause you to have increased pain or swelling in your knee joint, decrease the amount until you are comfortable again and slowly increase them. If you have problems or questions, call your caregiver or physical therapist for advice.   HOME CARE INSTRUCTIONS  Remove items at home which could result in a fall. This includes throw rugs or furniture in walking pathways.  ICE to the affected knee every three hours for 30 minutes at a time and then as needed for pain and swelling.  Continue to use ice on the knee for pain and swelling from surgery. You may notice swelling that will progress down to the foot and ankle.  This is normal after surgery.  Elevate the leg when you are not up walking on it.   Continue to use the breathing machine which will help keep your temperature down.  It is common for your temperature to cycle up and down following surgery, especially at night when you are not up moving around and exerting yourself.  The breathing machine keeps your lungs expanded and your temperature down. Do not place pillow under knee, focus on keeping the knee straight while resting  DIET You may resume your previous home diet once your are discharged from the hospital.  DRESSING / WOUND CARE / SHOWERING You may change your dressing every day with sterile gauze.  Please use good hand washing techniques before changing the dressing.  Do not use any lotions or creams on the  incision until instructed by your surgeon. You may start showering once you are discharged home but do not submerge the incision under water. Just pat the incision dry and apply a dry gauze dressing on daily. Change the surgical dressing daily and reapply a dry dressing each time.  ACTIVITY Walk with your walker as instructed. Use walker as long as suggested by your caregivers. Avoid periods of inactivity such as sitting longer than an hour when not asleep. This helps prevent blood clots.  You may resume a sexual relationship in one month or when given the OK by your doctor.  You may return to work once you are cleared by your doctor.  Do not drive a car for 6 weeks or until released by you surgeon.  Do not drive while taking narcotics.  WEIGHT BEARING Weight bearing as tolerated with assist device (walker, cane, etc) as directed, use it as long as suggested by your surgeon or therapist, typically at least 4-6 weeks.  POSTOPERATIVE CONSTIPATION PROTOCOL Constipation - defined medically as fewer than three stools per week and severe constipation as less than one stool per week.  One of the most common issues patients have following surgery is constipation.  Even if you have a regular bowel pattern at home, your normal regimen is likely to be disrupted due to multiple reasons following surgery.  Combination of anesthesia, postoperative narcotics, change in appetite and fluid intake all can affect your bowels.  In order to avoid complications following surgery, here are some recommendations in order to help you during your recovery period.  Colace (docusate) - Pick up an over-the-counter form of Colace or another stool softener and take twice a day as long as you are requiring postoperative pain medications.  Take with a full glass of water daily.  If you experience loose stools or diarrhea, hold the colace until you stool forms back up.  If your symptoms do not get better within 1 week or if they  get worse, check with your doctor.  Dulcolax (bisacodyl) - Pick up over-the-counter and take as directed by the product packaging as needed to assist with the movement of your bowels.  Take with a full glass of water.  Use this product as needed if not relieved by Colace only.   MiraLax (polyethylene glycol) - Pick up over-the-counter to have on hand.  MiraLax is a solution that will  increase the amount of water in your bowels to assist with bowel movements.  Take as directed and can mix with a glass of water, juice, soda, coffee, or tea.  Take if you go more than two days without a movement. Do not use MiraLax more than once per day. Call your doctor if you are still constipated or irregular after using this medication for 7 days in a row.  If you continue to have problems with postoperative constipation, please contact the office for further assistance and recommendations.  If you experience "the worst abdominal pain ever" or develop nausea or vomiting, please contact the office immediatly for further recommendations for treatment.  ITCHING  If you experience itching with your medications, try taking only a single pain pill, or even half a pain pill at a time.  You can also use Benadryl over the counter for itching or also to help with sleep.   TED HOSE STOCKINGS Wear the elastic stockings on both legs for three weeks following surgery during the day but you may remove then at night for sleeping.  MEDICATIONS See your medication summary on the "After Visit Summary" that the nursing staff will review with you prior to discharge.  You may have some home medications which will be placed on hold until you complete the course of blood thinner medication.  It is important for you to complete the blood thinner medication as prescribed by your surgeon.  Continue your approved medications as instructed at time of discharge.  Gabapentin 300 mg Protocol Take a 300 mg capsule three times a day for two  weeks, Then a 300 mg capsule twice a day for two weeks, Then a 300 mg capsule once a day for two weeks, then discontinue the Gabapentin.  PRECAUTIONS If you experience chest pain or shortness of breath - call 911 immediately for transfer to the hospital emergency department.  If you develop a fever greater that 101 F, purulent drainage from wound, increased redness or drainage from wound, foul odor from the wound/dressing, or calf pain - CONTACT YOUR SURGEON.                                                   FOLLOW-UP APPOINTMENTS Make sure you keep all of your appointments after your operation with your surgeon and caregivers. You should call the office at the above phone number and make an appointment for approximately two weeks after the date of your surgery or on the date instructed by your surgeon outlined in the "After Visit Summary".   RANGE OF MOTION AND STRENGTHENING EXERCISES  Rehabilitation of the knee is important following a knee injury or an operation. After just a few days of immobilization, the muscles of the thigh which control the knee become weakened and shrink (atrophy). Knee exercises are designed to build up the tone and strength of the thigh muscles and to improve knee motion. Often times heat used for twenty to thirty minutes before working out will loosen up your tissues and help with improving the range of motion but do not use heat for the first two weeks following surgery. These exercises can be done on a training (exercise) mat, on the floor, on a table or on a bed. Use what ever works the best and is most comfortable for you Knee exercises include:  Leg Lifts -  While your knee is still immobilized in a splint or cast, you can do straight leg raises. Lift the leg to 60 degrees, hold for 3 sec, and slowly lower the leg. Repeat 10-20 times 2-3 times daily. Perform this exercise against resistance later as your knee gets better.  Quad and Hamstring Sets - Tighten up the  muscle on the front of the thigh (Quad) and hold for 5-10 sec. Repeat this 10-20 times hourly. Hamstring sets are done by pushing the foot backward against an object and holding for 5-10 sec. Repeat as with quad sets.  Leg Slides: Lying on your back, slowly slide your foot toward your buttocks, bending your knee up off the floor (only go as far as is comfortable). Then slowly slide your foot back down until your leg is flat on the floor again. Angel Wings: Lying on your back spread your legs to the side as far apart as you can without causing discomfort.  A rehabilitation program following serious knee injuries can speed recovery and prevent re-injury in the future due to weakened muscles. Contact your doctor or a physical therapist for more information on knee rehabilitation.   IF YOU ARE TRANSFERRED TO A SKILLED REHAB FACILITY If the patient is transferred to a skilled rehab facility following release from the hospital, a list of the current medications will be sent to the facility for the patient to continue.  When discharged from the skilled rehab facility, please have the facility set up the patient's [place] prior to being released. Also, the skilled facility will be responsible for providing the patient with their medications at time of release from the facility to include their pain medication, the muscle relaxants, and their blood thinner medication. If the patient is still at the rehab facility at time of the two week follow up appointment, the skilled rehab facility will also need to assist the patient in arranging follow up appointment in our office and any transportation needs.  MAKE SURE YOU:  Understand these instructions.  Get help right away if you are not doing well or get worse.    Pick up stool softner and laxative for home use following surgery while on pain medications. Do not submerge incision under water. Please use good hand washing techniques while  changing dressing each day. May shower starting three days after surgery. Please use a clean towel to pat the incision dry following showers. Continue to use ice for pain and swelling after surgery. Do not use any lotions or creams on the incision until instructed by your surgeon.   Increase activity slowly as tolerated   Complete by:  As directed      Allergies as of 04/20/2018   No Known Allergies     Medication List    TAKE these medications   atorvastatin 20 MG tablet Commonly known as:  LIPITOR Take 1 tablet (20 mg total) by mouth daily.   carvedilol 25 MG tablet Commonly known as:  COREG Take 1 tablet (25 mg total) by mouth 2 (two) times daily.   losartan 100 MG tablet Commonly known as:  COZAAR Take 1 tablet (100 mg total) by mouth daily.   methocarbamol 500 MG tablet Commonly known as:  ROBAXIN Take 1 tablet (500 mg total) by mouth every 6 (six) hours as needed for muscle spasms.   montelukast 10 MG tablet Commonly known as:  SINGULAIR Take 10 mg by mouth every morning.   omeprazole 40 MG capsule Commonly known as:  PRILOSEC Take 40 mg by mouth daily before breakfast.   oxyCODONE 5 MG immediate release tablet Commonly known as:  Oxy IR/ROXICODONE Take 1-2 tablets (5-10 mg total) by mouth every 4 (four) hours as needed for moderate pain or severe pain.   rivaroxaban 10 MG Tabs tablet Commonly known as:  XARELTO Take 1 tablet (10 mg total) by mouth daily with breakfast.   spironolactone 25 MG tablet Commonly known as:  ALDACTONE TAKE ONE-HALF TABLET BY MOUTH TWO TIMES A DAY What changed:    how much to take  how to take this  when to take this  additional instructions   traMADol 50 MG tablet Commonly known as:  ULTRAM Take 1-2 tablets (50-100 mg total) by mouth every 6 (six) hours as needed for moderate pain (not responsive to oxycodone).            Durable Medical Equipment  (From admission, onward)        Start     Ordered   04/19/18  0921  For home use only DME Walker rolling  Once    Question:  Patient needs a walker to treat with the following condition  Answer:  Surgery, elective   04/19/18 7793     Follow-up Information    Gaynelle Arabian, MD. Schedule an appointment as soon as possible for a visit on 05/01/2018.   Specialty:  Orthopedic Surgery Contact information: [address] [place] [address] [place] [address] [phone]        [facility] - Dme Follow up.   Why:  walker Contact information: [address] [place] [address] [phone]           Signed: Theresa Duty, PA-C Orthopaedic Surgery 04/20/2018, 2:41 PM

## 2018-04-20 NOTE — Progress Notes (Signed)
D/C instructions and prescriptions were discussed and given to pt and pt's Wife. Pt and pt's Wife report no further questions or concerns.

## 2018-04-20 NOTE — Plan of Care (Signed)
Reviewed plan of care with patient, specifically pain control measures, safety precautions, IS use, and importance of notifying staff with any questions or concerns. Pt attentive and verbalized understanding of all education. 

## 2018-04-20 NOTE — Progress Notes (Addendum)
   Subjective: 2 Days Post-Op Procedure(s) (LRB): LEFT TOTAL KNEE REVISION (Left) Patient reports pain as mild.   Patient seen in rounds with Dr. Wynelle Link. Patient is well, and has had no acute complaints or problems. States he is ready to go home today. Denies chest pain, SOB or calf pain. Voiding without difficulty and positive flatus.  We will resume therapy today.  Plan is to go Home after hospital stay.  Objective: Vital signs in last 24 hours: Temp:  [97.7 F (36.5 C)-98.4 F (36.9 C)] 97.7 F (36.5 C) (06/14 0541) Pulse Rate:  [52-66] 52 (06/14 0541) Resp:  [12-18] 18 (06/14 0541) BP: (119-183)/(69-94) 131/72 (06/14 0541) SpO2:  [90 %-96 %] 96 % (06/14 0541)  Intake/Output from previous day:  Intake/Output Summary (Last 24 hours) at 04/20/2018 0714 Last data filed at 04/20/2018 0549 Gross per 24 hour  Intake 1360 ml  Output 325 ml  Net 1035 ml    Labs: Recent Labs    04/19/18 0539 04/20/18 0554  HGB 13.5 11.9*   Recent Labs    04/19/18 0539 04/20/18 0554  WBC 15.2* 11.3*  RBC 4.66 4.07*  HCT 41.3 36.4*  PLT 222 234   Recent Labs    04/19/18 0539 04/20/18 0554  NA 138 140  K 4.6 3.7  CL 101 101  CO2 28 32  BUN 16 19  CREATININE 1.00 1.00  GLUCOSE 142* 159*  CALCIUM 8.6* 9.0   EXAM General - Patient is Alert and Oriented Extremity - Neurologically intact Neurovascular intact Sensation intact distally Dorsiflexion/Plantar flexion intact Dressing - dressing C/D/I Motor Function - intact, moving foot and toes well on exam.   Past Medical History:  Diagnosis Date  . Abdominal distention   . Acute encephalopathy   . Acute respiratory failure with hypoxia ([place])   . Acute systolic congestive heart failure ([place])   . Agitation   . Anoxic-ischemic encephalopathy ([place]) 07/05/2017  . Benign essential HTN   . Bradycardia   . Cardiac device in situ   . Encounter for central line placement   . Encounter for imaging study to confirm orogastric (OG)  tube placement   . Gastroesophageal reflux disease   . HCAP (healthcare-associated pneumonia)   . History of cardiac arrest   . Hypertension   . Leukocytosis   . Prediabetes   . ST elevation myocardial infarction (STEMI) ([place])   . Streptococcal sore throat   . Sudden cardiac death [facility]) 27-Jun-2017   Sudden cardiac death/Takatsubo  syndrome  . Takatsuki syndrome   . Ventricular fibrillation ([place]) 06/27/2017    Assessment/Plan: 2 Days Post-Op Procedure(s) (LRB): LEFT TOTAL KNEE REVISION (Left) Principal Problem:   Failed total knee arthroplasty ([place])  Estimated body mass index is 29.02 kg/m as calculated from the following:   Height as of this encounter: 6' (1.829 m).   Weight as of this encounter: 97.1 kg (214 lb). Up with therapy D/C IV fluids  DVT Prophylaxis - Xarelto Weight Bearing As Tolerated  Pt doing very well this AM during rounds. Plan for D/C to home with outpatient PT if progresses with therapy and stable. Will follow-up in the office with Dr. Wynelle Link for 2 weeks.   Theresa Duty, PA-C Orthopaedic Surgery 04/20/2018, 7:14 AM

## 2018-04-20 NOTE — Progress Notes (Signed)
Physical Therapy Treatment Patient Details Name: Jeffery Thomas MRN: [id number] DOB: 22-Sep-1951 Today's Date: 04/20/2018    History of Present Illness Pt s/p L TKR revision and with hx of MI last year    PT Comments    Pt progressing steadily with mobility and eager for dc home.  Spouse present and reviewed don/doff KI, car transfers and stairs.     Follow Up Recommendations  Follow surgeon's recommendation for DC plan and follow-up therapies     Equipment Recommendations  Rolling walker with 5" wheels;3in1 (PT)    Recommendations for Other Services       Precautions / Restrictions Precautions Precautions: Knee;Fall Required Braces or Orthoses: Knee Immobilizer - Left Knee Immobilizer - Left: Discontinue once straight leg raise with < 10 degree lag Restrictions Weight Bearing Restrictions: No Other Position/Activity Restrictions: WBAT    Mobility  Bed Mobility Overal bed mobility: Needs Assistance Bed Mobility: Supine to Sit     Supine to sit: Min guard     General bed mobility comments: min cues for sequence  Transfers Overall transfer level: Needs assistance Equipment used: Rolling walker (2 wheeled) Transfers: Sit to/from Stand Sit to Stand: Min guard;Supervision         General transfer comment: cues for LE management and use of UEs to self assist  Ambulation/Gait Ambulation/Gait assistance: Min guard;Supervision Gait Distance (Feet): 140 Feet Assistive device: Rolling walker (2 wheeled) Gait Pattern/deviations: Step-to pattern;Step-through pattern;Shuffle;Trunk flexed;Decreased step length - right;Decreased step length - left Gait velocity: decr   General Gait Details: min cues for posture and position from RW   Stairs Stairs: Yes Stairs assistance: Min assist Stair Management: No rails;Step to pattern;Backwards;Forwards;With walker Number of Stairs: 5 General stair comments: 2 step bkwd, single step fwd and single step bkwd with RW and cues  for sequence and foot/RW placement   Wheelchair Mobility    Modified Rankin (Stroke Patients Only)       Balance                                            Cognition Arousal/Alertness: Awake/alert Behavior During Therapy: WFL for tasks assessed/performed Overall Cognitive Status: Within Functional Limits for tasks assessed                                        Exercises Total Joint Exercises Ankle Circles/Pumps: AROM;Both;20 reps;Supine Quad Sets: AROM;Both;10 reps;Supine Short Arc Quad: AAROM;Left;10 reps;Supine Heel Slides: AAROM;Left;Supine;20 reps Straight Leg Raises: AAROM;Left;Supine;20 reps    General Comments        Pertinent Vitals/Pain Pain Assessment: 0-10 Pain Score: 5  Pain Location: L knee Pain Descriptors / Indicators: Aching;Sore Pain Intervention(s): Premedicated before session;Monitored during session;Limited activity within patient's tolerance;Ice applied    Home Living                      Prior Function            PT Goals (current goals can now be found in the care plan section) Acute Rehab PT Goals Patient Stated Goal: Regain IND PT Goal Formulation: With patient Time For Goal Achievement: 04/26/18 Potential to Achieve Goals: Good Progress towards PT goals: Progressing toward goals    Frequency    7X/week  PT Plan Current plan remains appropriate    Co-evaluation              AM-PAC PT "6 Clicks" Daily Activity  Outcome Measure  Difficulty turning over in bed (including adjusting bedclothes, sheets and blankets)?: A Lot Difficulty moving from lying on back to sitting on the side of the bed? : A Lot Difficulty sitting down on and standing up from a chair with arms (e.g., wheelchair, bedside commode, etc,.)?: A Little Help needed moving to and from a bed to chair (including a wheelchair)?: A Little Help needed walking in hospital room?: A Little Help needed climbing 3-5  steps with a railing? : A Little 6 Click Score: 16    End of Session Equipment Utilized During Treatment: Gait belt;Left knee immobilizer Activity Tolerance: Patient tolerated treatment well Patient left: in chair;with call bell/phone within reach;with family/visitor present Nurse Communication: Mobility status PT Visit Diagnosis: Difficulty in walking, not elsewhere classified (R26.2)     Time: 2334-3568 PT Time Calculation (min) (ACUTE ONLY): 27 min  Charges:  $Gait Training: 8-22 mins $Therapeutic Exercise: 23-37 mins $Therapeutic Activity: 8-22 mins                    G Codes:       Pg [phone]    Shaleen Talamantez 04/20/2018, 2:30 PM

## 2018-04-20 NOTE — Progress Notes (Signed)
Physical Therapy Treatment Patient Details Name: Jeffery Thomas MRN: [id number] DOB: 14-Jul-1951 Today's Date: 04/20/2018    History of Present Illness Pt s/p L TKR revision and with hx of MI last year    PT Comments    Pt performed therex program and reviewed home therex program including self assisting with sheet and with written instruction provided and spouse present during session.   Follow Up Recommendations  Follow surgeon's recommendation for DC plan and follow-up therapies     Equipment Recommendations  Rolling walker with 5" wheels;3in1 (PT)    Recommendations for Other Services       Precautions / Restrictions Precautions Precautions: Knee;Fall Required Braces or Orthoses: Knee Immobilizer - Left Knee Immobilizer - Left: Discontinue once straight leg raise with < 10 degree lag Restrictions Weight Bearing Restrictions: No Other Position/Activity Restrictions: WBAT    Mobility  Bed Mobility                  Transfers                    Ambulation/Gait                 Stairs             Wheelchair Mobility    Modified Rankin (Stroke Patients Only)       Balance                                            Cognition Arousal/Alertness: Awake/alert Behavior During Therapy: WFL for tasks assessed/performed Overall Cognitive Status: Within Functional Limits for tasks assessed                                        Exercises Total Joint Exercises Ankle Circles/Pumps: AROM;Both;20 reps;Supine Quad Sets: AROM;Both;10 reps;Supine Short Arc Quad: AAROM;Left;10 reps;Supine Heel Slides: AAROM;Left;Supine;20 reps Straight Leg Raises: AAROM;Left;Supine;20 reps    General Comments        Pertinent Vitals/Pain Pain Assessment: 0-10 Pain Score: 5  Pain Location: L knee Pain Descriptors / Indicators: Aching;Sore Pain Intervention(s): Limited activity within patient's tolerance;Monitored  during session;Premedicated before session;Ice applied    Home Living                      Prior Function            PT Goals (current goals can now be found in the care plan section) Acute Rehab PT Goals Patient Stated Goal: Regain IND PT Goal Formulation: With patient Time For Goal Achievement: 04/26/18 Potential to Achieve Goals: Good Progress towards PT goals: Progressing toward goals    Frequency    7X/week      PT Plan Current plan remains appropriate    Co-evaluation              AM-PAC PT "6 Clicks" Daily Activity  Outcome Measure  Difficulty turning over in bed (including adjusting bedclothes, sheets and blankets)?: Unable Difficulty moving from lying on back to sitting on the side of the bed? : Unable Difficulty sitting down on and standing up from a chair with arms (e.g., wheelchair, bedside commode, etc,.)?: Unable Help needed moving to and from a bed to chair (including a wheelchair)?: A Little Help needed walking in  hospital room?: A Little Help needed climbing 3-5 steps with a railing? : A Little 6 Click Score: 12    End of Session   Activity Tolerance: Patient tolerated treatment well Patient left: in bed;with call bell/phone within reach Nurse Communication: Mobility status PT Visit Diagnosis: Difficulty in walking, not elsewhere classified (R26.2)     Time: 5974-1638 PT Time Calculation (min) (ACUTE ONLY): 27 min  Charges:  $Therapeutic Exercise: 23-37 mins                    G Codes:       Pg [phone]    Jamarquis Crull 04/20/2018, 2:24 PM

## 2018-04-23 ENCOUNTER — Ambulatory Visit: Attending: Surgical | Admitting: Physical Therapy

## 2018-04-23 ENCOUNTER — Ambulatory Visit (INDEPENDENT_AMBULATORY_CARE_PROVIDER_SITE_OTHER): Payer: Self-pay

## 2018-04-23 ENCOUNTER — Encounter: Payer: Self-pay | Admitting: Physical Therapy

## 2018-04-23 DIAGNOSIS — M25662 Stiffness of left knee, not elsewhere classified: Secondary | ICD-10-CM | POA: Diagnosis present

## 2018-04-23 DIAGNOSIS — I428 Other cardiomyopathies: Secondary | ICD-10-CM

## 2018-04-23 DIAGNOSIS — R600 Localized edema: Secondary | ICD-10-CM | POA: Insufficient documentation

## 2018-04-23 DIAGNOSIS — Z95810 Presence of automatic (implantable) cardiac defibrillator: Secondary | ICD-10-CM

## 2018-04-23 DIAGNOSIS — R262 Difficulty in walking, not elsewhere classified: Secondary | ICD-10-CM | POA: Diagnosis present

## 2018-04-23 DIAGNOSIS — M25562 Pain in left knee: Secondary | ICD-10-CM | POA: Insufficient documentation

## 2018-04-23 NOTE — Therapy (Signed)
[place] [address] [place], [place], [address] Phone: [phone]   Fax:  [phone]  Physical Therapy Evaluation  Patient Details  Name: Jeffery Thomas MRN: [id number] Date of Birth: 1951-07-15 Referring Provider: Wynelle Link   Encounter Date: 04/23/2018  PT End of Session - 04/23/18 0957    Visit Number  1    Date for PT Re-Evaluation  06/23/18    PT Start Time  0915    PT Stop Time  6387    PT Time Calculation (min)  60 min    Activity Tolerance  Patient limited by lethargy;Patient limited by pain    Behavior During Therapy  Anxious       Past Medical History:  Diagnosis Date  . Abdominal distention   . Acute encephalopathy   . Acute respiratory failure with hypoxia ([place])   . Acute systolic congestive heart failure ([place])   . Agitation   . 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 Benign essential HTN   . Bradycardia   . Cardiac device in situ   . Encounter for central line placement   . Encounter for imaging study to confirm orogastric (OG) tube placement   . Gastroesophageal reflux disease   . HCAP (healthcare-associated pneumonia)   . History of cardiac arrest   . Hypertension   . Leukocytosis   . Prediabetes   . ST elevation myocardial infarction (STEMI) ([place])   . Streptococcal sore throat   . Sudden cardiac death [facility]) 2017/07/22   Sudden cardiac death/Takatsubo  syndrome  . Takatsuki syndrome   . Ventricular fibrillation ([place]) 07/22/17    Past Surgical History:  Procedure Laterality Date  . ICD IMPLANT N/A 07/04/2017   SJM Fortify Assura VR ICD implanted by Dr Rayann Heman for secondary prevention after VF arrest  . LEFT HEART CATH AND CORONARY ANGIOGRAPHY N/A 22-Jul-2017   Procedure: LEFT HEART CATH AND CORONARY ANGIOGRAPHY;  Surgeon: Lorretta Harp, MD;  Location: [facility];  Service: Cardiovascular;  Laterality: N/A;  . MENISCUS REPAIR  1983  . TOTAL KNEE REVISION Left  04/18/2018   Procedure: LEFT TOTAL KNEE REVISION;  Surgeon: Gaynelle Arabian, MD;  Location: [facility];  Service: Orthopedics;  Laterality: Left;  Adductor Block    There were no vitals filed for this visit.   Subjective Assessment - 04/23/18 0919    Subjective  Patient reports a left TKR revision 04/18/18, No PT since out of hospital.  He had a left TKR in 2010 and had ato have a manipulation.  REports that the replacement failed so he had to have the revision.    Limitations  Walking;House hold activities    Patient Stated Goals  have less pain, better motion and walk better    Currently in Pain?  Yes    Pain Score  8     Pain Location  Knee    Pain Orientation  Left    Pain Descriptors / Indicators  Aching    Pain Type  Acute pain;Surgical pain    Pain Onset  In the past 7 days    Pain Frequency  Constant    Aggravating Factors   movemenents, bending, walking pain up to 10/10    Pain Relieving Factors  rest, ice and pain meds, at best pain a 3/10    Effect of Pain on Daily Activities  limits everything         [facility] PT Assessment - 04/23/18 0001  Assessment   Medical Diagnosis  s/p left TKR     Referring Provider  Aluisio    Onset Date/Surgical Date  04/18/18    Prior Therapy  no      Precautions   Precautions  ICD/Pacemaker      Balance Screen   Has the patient fallen in the past 6 months  No    Has the patient had a decrease in activity level because of a fear of falling?   No    Is the patient reluctant to leave their home because of a fear of falling?   No      Home Environment   Additional Comments  no stairs, does housework      Prior Function   Level of Independence  Independent    Vocation  Retired    Leisure  golf 5x/week, some other exercises and walking      Observation/Other Assessments-Edema    Edema  Circumferential      Circumferential Edema   Circumferential - Right  39cm    Circumferential - Left   45.5cm      ROM / Strength   AROM / PROM /  Strength  AROM;PROM;Strength      AROM   AROM Assessment Site  Knee    Right/Left Knee  Left    Left Knee Extension  25    Left Knee Flexion  55      PROM   PROM Assessment Site  Knee    Right/Left Knee  Left    Left Knee Extension  15    Left Knee Flexion  55      Strength   Overall Strength Comments  2/5 due to pain and gaurding      Palpation   Palpation comment  very swollen with pitting edema, very tender, warm to tocuh in the leg and knee area      Ambulation/Gait   Gait Comments  has a knee immobilizewr on, slow, uses a FWW, step to gait does not put full weight on the leg      Standardized Balance Assessment   Standardized Balance Assessment  Timed Up and Go Test      Timed Up and Go Test   Normal TUG (seconds)  47                Objective measurements completed on examination: See above findings.      [facility] Adult PT Treatment/Exercise - 04/23/18 0001      Exercises   Exercises  Knee/Hip      Knee/Hip Exercises: Aerobic   Nustep  level 1 x 5 minutes, needed cues to stay awake and bend the knee      Modalities   Modalities  Vasopneumatic      Vasopneumatic   Number Minutes Vasopneumatic   15 minutes    Vasopnuematic Location   Knee    Vasopneumatic Pressure  Low    Vasopneumatic Temperature   33             PT Education - 04/23/18 0957    Education Details  HEP for LLLD flex and ext, heel slides, QS and SAQ's    Person(s) Educated  Patient;Spouse    Methods  Explanation;Demonstration;Handout;Verbal cues;Tactile cues    Comprehension  Verbalized understanding       PT Short Term Goals - 04/23/18 1001      PT SHORT TERM GOAL #1   Title  independent with initial HEP  Time  2    Period  Weeks    Status  New        PT Long Term Goals - 04/23/18 1001      PT LONG TERM GOAL #1   Title  decrease pain 50%    Time  8    Period  Weeks    Status  New      PT LONG TERM GOAL #2   Title  incresae AROM to 10-110 degrees flexion     Time  8    Period  Weeks    Status  New      PT LONG TERM GOAL #3   Title  walk without device and minimal deviation    Time  8    Period  Weeks    Status  New      PT LONG TERM GOAL #4   Title  go up and down stairs step over step    Time  8    Period  Weeks    Status  New      PT LONG TERM GOAL #5   Title  perform RICE at home    Time  [place] - 04/23/18 0958    Clinical Impression Statement  Patient with a left TKR revision on 04/18/18, he had one in 2010 with the need for a manipulation.  He come in in an immobilizer and rating a high level of pain, he is swollen and has warmth, but negative Homan's.  He is hesitant to allow any motions, his AROM was 25-55 degrees flexion, very poor tolerance to movements and touch.  TUG time was 47 seconds.  He does not want to bend it and again has a high rating of pain    History and Personal Factors relevant to plan of care:  has an ICD, no estim, history of CVA    Clinical Presentation  Evolving    Clinical Decision Making  Moderate    Rehab Potential  Good    PT Frequency  3x / week    PT Duration  8 weeks    PT Treatment/Interventions  ADLs/Self Care Home Management;Cryotherapy;Gait training;Therapeutic exercise;Therapeutic activities;Functional mobility training;Stair training;Patient/family education;Manual techniques;Vasopneumatic Device    PT Next Visit Plan  add exercises and try to get him to move the leg and walk better    Consulted and Agree with Plan of Care  Patient       Patient will benefit from skilled therapeutic intervention in order to improve the following deficits and impairments:  Abnormal gait, Decreased range of motion, Difficulty walking, Decreased activity tolerance, Pain, Impaired flexibility, Decreased scar mobility, Increased edema, Decreased strength, Decreased mobility  Visit Diagnosis: Acute pain of left knee - Plan: PT plan of care  cert/re-cert  Stiffness of left knee, not elsewhere classified - Plan: PT plan of care cert/re-cert  Difficulty in walking, not elsewhere classified - Plan: PT plan of care cert/re-cert  Localized edema - Plan: PT plan of care cert/re-cert     Problem List Patient Active Problem List   Diagnosis Date Noted  . 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 Hyperlipidemia 10/24/2017  . Benign essential HTN   . History of cardiac arrest   . Streptococcal sore throat   . Anoxic-ischemic encephalopathy ([place]) 07/05/2017  . Cardiac device in situ   .  Gastroesophageal reflux disease   . Acute systolic congestive heart failure ([place])   . Takatsuki syndrome   . Leukocytosis   . Prediabetes   . HCAP (healthcare-associated pneumonia)   . Agitation   . Acute encephalopathy   . Bradycardia   . Essential hypertension   . Abdominal distention   . Encounter for imaging study to confirm orogastric (OG) tube placement   . Acute respiratory failure with hypoxia ([place])   . Encounter for central line placement   . Ventricular fibrillation ([place]) 2017-07-06  . Sudden cardiac death ([place]) 07-06-2017  . ST elevation myocardial infarction (STEMI) [facility])     Sumner Boast., PT 04/23/2018, 10:04 AM  [place], [place], [address] Phone: [phone]   Fax:  [phone]  Name: ABDOUL ENCINAS MRN: [id number] Date of Birth: 10-Nov-1950

## 2018-04-23 NOTE — Progress Notes (Signed)
EPIC Encounter for ICM Monitoring  Patient Name: Jeffery Thomas is a 67 y.o. male Date: 04/23/2018 Primary Care Physican: Bernerd Limbo, MD Primary Cardiologist:Berry Electrophysiologist:Allred Dry Weight:Previous weight202lbs      Attempted call to patient and unable to reach.  Left message to return call.  Transmission reviewed.  Total Knee replacement 6/12   Thoracic impedance abnormal suggesting fluid accumulation from 04/11/2018 - 04/17/2018 and starting 04/19/2018 which correlates with hospitalization for knee surgery.  Recommendations: NONE - Unable to reach.  Follow-up plan: ICM clinic phone appointment on 04/26/2018 to recheck fluid levels.    Copy of ICM check sent to Dr. Rayann Heman and Dr. Gwenlyn Found.   3 month ICM trend: 04/23/2018    1 Year ICM trend:       Rosalene Billings, RN 04/23/2018 11:18 AM

## 2018-04-24 ENCOUNTER — Telehealth: Payer: Self-pay

## 2018-04-24 NOTE — Telephone Encounter (Signed)
Remote ICM transmission received.  Attempted call to patient and left message to return call. 

## 2018-04-25 ENCOUNTER — Encounter: Payer: Self-pay | Admitting: Physical Therapy

## 2018-04-25 ENCOUNTER — Ambulatory Visit: Admitting: Physical Therapy

## 2018-04-25 DIAGNOSIS — M25562 Pain in left knee: Secondary | ICD-10-CM | POA: Diagnosis not present

## 2018-04-25 DIAGNOSIS — R600 Localized edema: Secondary | ICD-10-CM

## 2018-04-25 DIAGNOSIS — M25662 Stiffness of left knee, not elsewhere classified: Secondary | ICD-10-CM

## 2018-04-25 NOTE — Therapy (Signed)
[place], [place], [address] Phone: [phone]   Fax:  [phone]  Physical Therapy Treatment  Patient Details  Name: Jeffery Thomas MRN: [id number] Date of Birth: 22-May-1951 Referring Provider: Wynelle Link   Encounter Date: 04/25/2018  PT End of Session - 04/25/18 1604    Visit Number  2    Date for PT Re-Evaluation  06/23/18    PT Start Time  2993    PT Stop Time  7169    PT Time Calculation (min)  60 min    Activity Tolerance  Patient limited by lethargy;Patient limited by pain    Behavior During Therapy  Anxious       Past Medical History:  Diagnosis Date  . Abdominal distention   . Acute encephalopathy   . Acute respiratory failure with hypoxia ([place])   . Acute systolic congestive heart failure ([place])   . Agitation   . Anoxic-ischemic encephalopathy ([place]) 07/05/2017  . Benign essential HTN   . Bradycardia   . Cardiac device in situ   . Encounter for central line placement   . Encounter for imaging study to confirm orogastric (OG) tube placement   . Gastroesophageal reflux disease   . HCAP (healthcare-associated pneumonia)   . History of cardiac arrest   . Hypertension   . Leukocytosis   . Prediabetes   . ST elevation myocardial infarction (STEMI) ([place])   . Streptococcal sore throat   . Sudden cardiac death [facility]) 07-14-2017   Sudden cardiac death/Takatsubo  syndrome  . Takatsuki syndrome   . 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 ICD IMPLANT N/A 07/04/2017   SJM Fortify Assura VR ICD implanted by Dr Rayann Heman for secondary prevention after VF arrest  . LEFT HEART CATH AND CORONARY ANGIOGRAPHY N/A July 14, 2017   Procedure: LEFT HEART CATH AND CORONARY ANGIOGRAPHY;  Surgeon: Lorretta Harp, MD;  Location: [facility];  Service: Cardiovascular;  Laterality: N/A;  . MENISCUS REPAIR  1983  . TOTAL KNEE REVISION Left  04/18/2018   Procedure: LEFT TOTAL KNEE REVISION;  Surgeon: Gaynelle Arabian, MD;  [facility];  Service: Orthopedics;  Laterality: Left;  Adductor Block    There were no vitals filed for this visit.  Subjective Assessment - 04/25/18 1513    Subjective  "It feels ok"    Currently in Pain?  No/denies    Pain Score  0-No pain         [facility] PT Assessment - 04/25/18 0001      AROM   Left Knee Flexion  68                   [facility] Adult PT Treatment/Exercise - 04/25/18 0001      Knee/Hip Exercises: Aerobic   Nustep  level 1 x 6 minutes, needed cues to stay awake and bend the knee      Knee/Hip Exercises: Seated   Long Arc Quad  Left;1 set;10 reps    Other Seated Knee/Hip Exercises  quad sets x10       Knee/Hip Exercises: Supine   Short Arc Quad Sets  Left;1 set;10 reps      Modalities   Modalities  Vasopneumatic      Vasopneumatic   Number Minutes Vasopneumatic   15 minutes    Vasopnuematic Location   Knee    Vasopneumatic Pressure  Low    Vasopneumatic  Temperature   33      Manual Therapy   Manual Therapy  Passive ROM;Joint mobilization    Manual therapy comments  Dense soft tissues above L patella     Joint Mobilization  tibiofemoral Jt graded 1-2     Passive ROM  L knee flexiona and  extension               PT Short Term Goals - 04/23/18 1001      PT SHORT TERM GOAL #1   Title  independent with initial HEP    Time  2    Period  Weeks    Status  New        PT Long Term Goals - 04/23/18 1001      PT LONG TERM GOAL #1   Title  decrease pain 50%    Time  8    Period  Weeks    Status  New      PT LONG TERM GOAL #2   Title  incresae AROM to 10-110 degrees flexion    Time  8    Period  Weeks    Status  New      PT LONG TERM GOAL #3   Title  walk without device and minimal deviation    Time  8    Period  Weeks    Status  New      PT LONG TERM GOAL #4   Title  go up and down stairs step over step    Time  8    Period  Weeks     Status  New      PT LONG TERM GOAL #5   Title  perform RICE at home    Time  8    Period  Weeks    Status  New            Plan - 04/25/18 1605    Clinical Impression Statement  Pt has a very low pain tolerance, very slow PROM during MT. Pt was able to achieve some movement with SAQ and LAQ but reports pain throughout. Avoid bending L knee when on NuStep    Rehab Potential  Good    PT Frequency  3x / week    PT Duration  8 weeks    PT Treatment/Interventions  ADLs/Self Care Home Management;Cryotherapy;Gait training;Therapeutic exercise;Therapeutic activities;Functional mobility training;Stair training;Patient/family education;Manual techniques;Vasopneumatic Device    PT Next Visit Plan  add exercises and try to get him to move the leg and walk better       Patient will benefit from skilled therapeutic intervention in order to improve the following deficits and impairments:  Abnormal gait, Decreased range of motion, Difficulty walking, Decreased activity tolerance, Pain, Impaired flexibility, Decreased scar mobility, Increased edema, Decreased strength, Decreased mobility  Visit Diagnosis: Acute pain of left knee  Stiffness of left knee, not elsewhere classified  Localized edema     Problem List Patient Active Problem List   Diagnosis Date Noted  . 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 Hyperlipidemia 10/24/2017  . Benign essential HTN   . History of cardiac arrest   . Streptococcal sore throat   . Anoxic-ischemic encephalopathy ([place]) 07/05/2017  . Cardiac device in situ   . Gastroesophageal reflux disease   . Acute systolic congestive heart failure ([place])   . Takatsuki syndrome   . Leukocytosis   . Prediabetes   . HCAP (healthcare-associated pneumonia)   . Agitation   .  Acute encephalopathy   . Bradycardia   . Essential hypertension   . Abdominal distention   . Encounter for imaging study to confirm orogastric (OG) tube placement   . Acute  respiratory failure with hypoxia ([place])   . Encounter for central line placement   . Ventricular fibrillation ([place]) 07/01/17  . Sudden cardiac death ([place]) 07/01/17  . ST elevation myocardial infarction (STEMI) ([place])     Scot Jun, PTA 04/25/2018, 4:14 PM  [place], [place], [address] Phone: [phone]   Fax:  [phone]  Name: Jeffery Thomas MRN: [id number] Date of Birth: 1951/02/06

## 2018-04-26 ENCOUNTER — Encounter: Payer: Self-pay | Admitting: Physical Therapy

## 2018-04-26 ENCOUNTER — Ambulatory Visit: Admitting: Physical Therapy

## 2018-04-26 ENCOUNTER — Ambulatory Visit (INDEPENDENT_AMBULATORY_CARE_PROVIDER_SITE_OTHER): Payer: Self-pay

## 2018-04-26 DIAGNOSIS — M25662 Stiffness of left knee, not elsewhere classified: Secondary | ICD-10-CM

## 2018-04-26 DIAGNOSIS — Z95810 Presence of automatic (implantable) cardiac defibrillator: Secondary | ICD-10-CM

## 2018-04-26 DIAGNOSIS — M25562 Pain in left knee: Secondary | ICD-10-CM | POA: Diagnosis not present

## 2018-04-26 DIAGNOSIS — R600 Localized edema: Secondary | ICD-10-CM

## 2018-04-26 DIAGNOSIS — I428 Other cardiomyopathies: Secondary | ICD-10-CM

## 2018-04-26 DIAGNOSIS — R262 Difficulty in walking, not elsewhere classified: Secondary | ICD-10-CM

## 2018-04-26 LAB — CUP PACEART REMOTE DEVICE CHECK
Battery Remaining Longevity: 25 mo
Battery Remaining Percentage: 23 %
Battery Voltage: 2.8 V
Date Time Interrogation Session: 20190603185148
HIGH POWER IMPEDANCE MEASURED VALUE: 80 Ohm
HIGH POWER IMPEDANCE MEASURED VALUE: 80 Ohm
Implantable Lead Implant Date: 20180828
Implantable Pulse Generator Implant Date: 20180828
Lead Channel Impedance Value: 480 Ohm
Lead Channel Pacing Threshold Amplitude: 0.5 V
Lead Channel Pacing Threshold Pulse Width: 0.5 ms
MDC IDC LEAD LOCATION: 753860
MDC IDC MSMT LEADCHNL RV SENSING INTR AMPL: 11.8 mV
MDC IDC PG SERIAL: 7423068
MDC IDC SET LEADCHNL RV PACING AMPLITUDE: 2.5 V
MDC IDC SET LEADCHNL RV PACING PULSEWIDTH: 0.5 ms
MDC IDC SET LEADCHNL RV SENSING SENSITIVITY: 0.5 mV
MDC IDC STAT BRADY RV PERCENT PACED: 1 %

## 2018-04-26 NOTE — Therapy (Signed)
[place], [place], [address] Phone: [phone]   Fax:  [phone]  Physical Therapy Treatment  Patient Details  Name: Jeffery Thomas MRN: [id number] Date of Birth: [birth year] Referring Provider: Wynelle Link   Encounter Date: 04/26/2018  PT End of Session - 04/26/18 1744    Visit Number  3    Date for PT Re-Evaluation  06/23/18    PT Start Time  6767    PT Stop Time  1752    PT Time Calculation (min)  62 min       Past Medical History:  Diagnosis Date  . Abdominal distention   . Acute encephalopathy   . Acute respiratory failure with hypoxia ([place])   . Acute systolic congestive heart failure ([place])   . Agitation   . 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 Benign essential HTN   . Bradycardia   . Cardiac device in situ   . Encounter for central line placement   . Encounter for imaging study to confirm orogastric (OG) tube placement   . Gastroesophageal reflux disease   . HCAP (healthcare-associated pneumonia)   . History of cardiac arrest   . Hypertension   . Leukocytosis   . Prediabetes   . ST elevation myocardial infarction (STEMI) ([place])   . Streptococcal sore throat   . Sudden cardiac death [facility]) 07/14/2017   Sudden cardiac death/Takatsubo  syndrome  . Takatsuki syndrome   . Ventricular fibrillation ([place]) July 14, 2017    Past Surgical History:  Procedure Laterality Date  . ICD IMPLANT N/A 07/04/2017   SJM Fortify Assura VR ICD implanted by Dr Rayann Heman for secondary prevention after VF arrest  . LEFT HEART CATH AND CORONARY ANGIOGRAPHY N/A 07-14-17   Procedure: LEFT HEART CATH AND CORONARY ANGIOGRAPHY;  Surgeon: Lorretta Harp, MD;  Location: [facility];  Service: Cardiovascular;  Laterality: N/A;  . MENISCUS REPAIR  1983  . TOTAL KNEE REVISION Left 04/18/2018   Procedure: LEFT TOTAL KNEE REVISION;  Surgeon: Gaynelle Arabian, MD;  [facility];  Service:  Orthopedics;  Laterality: Left;  Adductor Block    There were no vitals filed for this visit.  Subjective Assessment - 04/26/18 1653    Subjective  amb in with RW -on toes and lack of ext- with cuing able to improve    Currently in Pain?  Yes    Pain Score  5     Pain Location  Knee    Pain Orientation  Left         [facility] PT Assessment - 04/26/18 0001      AROM   Left Knee Flexion  77                   [facility] Adult PT Treatment/Exercise - 04/26/18 0001      Knee/Hip Exercises: Aerobic   Nustep  L 2 6 min      Knee/Hip Exercises: Standing   Hip Flexion  Left;2 sets;10 reps;Knee bent      Knee/Hip Exercises: Seated   Long Arc Quad  Left;2 sets;10 reps PTA assist to decrease hip flex compensation    Hamstring Curl  Left;2 sets;10 reps yellow tband      Knee/Hip Exercises: Supine   Quad Sets  Right;10 reps    Short Arc Quad Sets  Left;2 sets;10 reps    Other Supine Knee/Hip Exercises  bridge with ball and ball rolling with PTA overpressure  Modalities   Modalities  Vasopneumatic      Vasopneumatic   Number Minutes Vasopneumatic   15 minutes    Vasopnuematic Location   Knee    Vasopneumatic Pressure  Low    Vasopneumatic Temperature   33      Manual Therapy   Manual Therapy  Soft tissue mobilization;Passive ROM;Muscle Energy Technique    Soft tissue mobilization  left quad     Passive ROM  flex and ext    Muscle Energy Technique  CR to increase flexion             PT Education - 04/26/18 1744    Education Details  quad set, SAQ,LAQ,chair scot flex    Person(s) Educated  Patient    Methods  Explanation;Demonstration;Handout    Comprehension  Verbalized understanding;Returned demonstration       PT Short Term Goals - 04/23/18 1001      PT SHORT TERM GOAL #1   Title  independent with initial HEP    Time  2    Period  Weeks    Status  New        PT Long Term Goals - 04/23/18 1001      PT LONG TERM GOAL #1   Title  decrease pain  50%    Time  8    Period  Weeks    Status  New      PT LONG TERM GOAL #2   Title  incresae AROM to 10-110 degrees flexion    Time  8    Period  Weeks    Status  New      PT LONG TERM GOAL #3   Title  walk without device and minimal deviation    Time  8    Period  Weeks    Status  New      PT LONG TERM GOAL #4   Title  go up and down stairs step over step    Time  8    Period  Weeks    Status  New      PT LONG TERM GOAL #5   Title  perform RICE at home    Time  8    Period  Weeks    Status  New            Plan - 04/26/18 1745    Clinical Impression Statement  pt very pain limited and needed tatcile and VCing to relax hip flex and quad as he holds tight and fights against mvmt. hip flex compensation . very poor gait with RW decreased wt bearing- cuing to decrease compensations., holds in flex and minus heel strike.issued HEP and stressed imp of compliance    PT Treatment/Interventions  ADLs/Self Care Home Management;Cryotherapy;Gait training;Therapeutic exercise;Therapeutic activities;Functional mobility training;Stair training;Patient/family education;Manual techniques;Vasopneumatic Device    PT Next Visit Plan  assess exercises and try to get him to move the leg and walk better       Patient will benefit from skilled therapeutic intervention in order to improve the following deficits and impairments:  Abnormal gait, Decreased range of motion, Difficulty walking, Decreased activity tolerance, Pain, Impaired flexibility, Decreased scar mobility, Increased edema, Decreased strength, Decreased mobility  Visit Diagnosis: Acute pain of left knee  Stiffness of left knee, not elsewhere classified  Localized edema  Difficulty in walking, not elsewhere classified     Problem List Patient Active Problem List   Diagnosis Date Noted  . Failed total knee arthroplasty (  [place]) 04/18/2018  . Hyperlipidemia 10/24/2017  . Benign essential HTN   . History of cardiac arrest    . Streptococcal sore throat   . 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 Cardiac device in situ   . Gastroesophageal reflux disease   . Acute systolic congestive heart failure ([place])   . Takatsuki syndrome   . Leukocytosis   . Prediabetes   . HCAP (healthcare-associated pneumonia)   . Agitation   . Acute encephalopathy   . Bradycardia   . Essential hypertension   . Abdominal distention   . Encounter for imaging study to confirm orogastric (OG) tube placement   . Acute respiratory failure with hypoxia ([place])   . Encounter for central line placement   . Ventricular fibrillation ([place]) 07/25/17  . Sudden cardiac death ([place]) 25-Jul-2017  . ST elevation myocardial infarction (STEMI) ([place])     PAYSEUR,ANGIE  PTA 04/26/2018, 5:51 PM  [place], [place], [address] Phone: [phone]   Fax:  [phone]  Name: Jeffery Thomas MRN: [id number] Date of Birth: Apr 12, 1951

## 2018-04-27 ENCOUNTER — Telehealth: Payer: Self-pay

## 2018-04-27 NOTE — Telephone Encounter (Signed)
Remote ICM transmission received.  Attempted call to patient and left message to return call. 

## 2018-04-27 NOTE — Progress Notes (Signed)
EPIC Encounter for ICM Monitoring  Patient Name: Jeffery Thomas is a 67 y.o. male Date: 04/27/2018 Primary Care Physican: Bernerd Limbo, MD Primary Cardiologist:Berry Electrophysiologist:Allred Dry Weight:Previous weight202lbs  Attempted call to patient and unable to reach.  Left message to return call.  Transmission reviewed.  Total Knee replacement 6/12   Thoracic impedance abnormal suggesting fluid accumulation from 04/11/2018 - 04/17/2018 and 04/19/2018 - 04/19/2018 which was during hospitalization for total knee.  Impedance also suggested dryness from 04/22/2018 but almost at baseline 04/26/2018   Recommendations: NONE - Unable to reach.  Follow-up plan: ICM clinic phone appointment on 05/21/2018.    Copy of ICM check sent to Dr. Rayann Heman.   3 month ICM trend: 04/26/2018    1 Year ICM trend:       Rosalene Billings, RN 04/27/2018 8:08 AM

## 2018-04-30 ENCOUNTER — Ambulatory Visit: Admitting: Physical Therapy

## 2018-04-30 ENCOUNTER — Encounter: Payer: Self-pay | Admitting: Physical Therapy

## 2018-04-30 DIAGNOSIS — R600 Localized edema: Secondary | ICD-10-CM

## 2018-04-30 DIAGNOSIS — R262 Difficulty in walking, not elsewhere classified: Secondary | ICD-10-CM

## 2018-04-30 DIAGNOSIS — M25562 Pain in left knee: Secondary | ICD-10-CM | POA: Diagnosis not present

## 2018-04-30 DIAGNOSIS — M25662 Stiffness of left knee, not elsewhere classified: Secondary | ICD-10-CM

## 2018-04-30 NOTE — Therapy (Signed)
During this treatment session, the therapist was present, participating in and directing the treatment. [place], [place], [address] Phone: [phone]   Fax:  [phone]  Physical Therapy Treatment  Patient Details  Name: Jeffery Thomas MRN: [id number] Date of Birth: 01/14/1951 Referring Provider: Wynelle Link   Encounter Date: 04/30/2018  PT End of Session - 04/30/18 1658    Visit Number  4    Date for PT Re-Evaluation  06/23/18    PT Start Time  6606    PT Stop Time  1656    PT Time Calculation (min)  62 min    Activity Tolerance  Patient limited by pain    Behavior During Therapy  [facility] for tasks assessed/performed       Past Medical History:  Diagnosis Date  . Abdominal distention   . Acute encephalopathy   . Acute respiratory failure with hypoxia ([place])   . Acute systolic congestive heart failure ([place])   . Agitation   . 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 Benign essential HTN   . Bradycardia   . Cardiac device in situ   . Encounter for central line placement   . Encounter for imaging study to confirm orogastric (OG) tube placement   . Gastroesophageal reflux disease   . HCAP (healthcare-associated pneumonia)   . History of cardiac arrest   . Hypertension   . Leukocytosis   . Prediabetes   . ST elevation myocardial infarction (STEMI) ([place])   . Streptococcal sore throat   . Sudden cardiac death [facility]) Jul 23, 2017   Sudden cardiac death/Takatsubo  syndrome  . Takatsuki syndrome   . Ventricular fibrillation ([place]) Jul 23, 2017    Past Surgical History:  Procedure Laterality Date  . ICD IMPLANT N/A 07/04/2017   SJM Fortify Assura VR ICD implanted by Dr Rayann Heman for secondary prevention after VF arrest  . LEFT HEART CATH AND CORONARY ANGIOGRAPHY N/A 23-Jul-2017   Procedure: LEFT HEART CATH AND CORONARY ANGIOGRAPHY;  Surgeon: Lorretta Harp, MD;  Location: [facility];   Service: Cardiovascular;  Laterality: N/A;  . MENISCUS REPAIR  1983  . TOTAL KNEE REVISION Left 04/18/2018   Procedure: LEFT TOTAL KNEE REVISION;  Surgeon: Gaynelle Arabian, MD;  [facility];  Service: Orthopedics;  Laterality: Left;  Adductor Block    There were no vitals filed for this visit.  Subjective Assessment - 04/30/18 1601    Subjective  Pt states that he is doing well today and that he had a good weekend. His knee is feeling a lot better and the pain is down today.     Currently in Pain?  Yes    Pain Score  3     Pain Location  Knee    Pain Orientation  Left    Pain Descriptors / Indicators  Aching    Pain Type  Acute pain;Surgical pain    Pain Frequency  Constant    Aggravating Factors   Movement, bending    Pain Relieving Factors  Ice, rest    Effect of Pain on Daily Activities  Getting in/out of driver's side (percieved)                       [facility] Adult PT Treatment/Exercise - 04/30/18 1640      Ambulation/Gait   Ambulation/Gait  Yes    Ambulation/Gait Assistance  6: Modified independent (Device/Increase time)    Ambulation Distance (Feet)  90 Feet    Assistive device  4-wheeled walker    Gait Pattern  Decreased hip/knee flexion - left;Decreased weight shift to left;Left foot flat;Left flexed knee in stance;Poor foot clearance - left    Ambulation Surface  Level;Indoor    Pre-Gait Activities  10 reps of step throughs with the L LE. PT facilitated heel strike at contact with knee extension at terminal stance and knee flexion at heel strike. Pt required verbal cues to maintain heel strike with L LE.     Gait Comments  Continues to amb with altered gait pattern and fixed knee throughout gait cycle.       Knee/Hip Exercises: Aerobic   Stepper  L4x6 min; UE and LE      Knee/Hip Exercises: Seated   Heel Slides  10 reps;AAROM;Left    Marching  Left;10 reps;AROM      Knee/Hip Exercises: Supine   Short Arc Quad Sets  10 reps;Left    Heel Slides  5  reps;AAROM 5 sec hold    Straight Leg Raises  10 reps;Left      Vasopneumatic   Number Minutes Vasopneumatic   15 minutes    Vasopnuematic Location   Knee    Vasopneumatic Pressure  Medium               PT Short Term Goals - 04/23/18 1001      PT SHORT TERM GOAL #1   Title  independent with initial HEP    Time  2    Period  Weeks    Status  New        PT Long Term Goals - 04/23/18 1001      PT LONG TERM GOAL #1   Title  decrease pain 50%    Time  8    Period  Weeks    Status  New      PT LONG TERM GOAL #2   Title  incresae AROM to 10-110 degrees flexion    Time  8    Period  Weeks    Status  New      PT LONG TERM GOAL #3   Title  walk without device and minimal deviation    Time  8    Period  Weeks    Status  New      PT LONG TERM GOAL #4   Title  go up and down stairs step over step    Time  8    Period  Weeks    Status  New      PT LONG TERM GOAL #5   Title  perform RICE at home    Time  8    Period  Weeks    Status  New            Plan - 04/30/18 1700    Clinical Impression Statement  Pt has noticed a significant improvement in pain since last visit. Pt continues to demonstrate impaired ROM into flexion and extension, which continues to limit his ability to perform ADLs. He will continue to benefit from physical therapy to address him ROM, impaired LE strength, and gait abnormalities to improve his functional ability.     Rehab Potential  Good    PT Treatment/Interventions  ADLs/Self Care Home Management;Cryotherapy;Gait training;Therapeutic exercise;Therapeutic activities;Functional mobility training;Stair training;Patient/family education;Manual techniques;Vasopneumatic Device    PT Next Visit Plan  Continue with gait activities and follow-up about LE elevation education    Consulted and Agree with Plan  of Care  Patient       Patient will benefit from skilled therapeutic intervention in order to improve the following deficits and  impairments:  Abnormal gait, Decreased range of motion, Difficulty walking, Decreased activity tolerance, Pain, Impaired flexibility, Decreased scar mobility, Increased edema, Decreased strength, Decreased mobility  Visit Diagnosis: No diagnosis found.     Problem List Patient Active Problem List   Diagnosis Date Noted  . 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 Hyperlipidemia 10/24/2017  . Benign essential HTN   . History of cardiac arrest   . Streptococcal sore throat   . 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 Cardiac device in situ   . Gastroesophageal reflux disease   . Acute systolic congestive heart failure ([place])   . Takatsuki syndrome   . Leukocytosis   . Prediabetes   . HCAP (healthcare-associated pneumonia)   . Agitation   . Acute encephalopathy   . Bradycardia   . Essential hypertension   . Abdominal distention   . Encounter for imaging study to confirm orogastric (OG) tube placement   . Acute respiratory failure with hypoxia ([place])   . Encounter for central line placement   . Ventricular fibrillation ([place]) 2017/07/12  . Sudden cardiac death ([place]) 12-Jul-2017  . ST elevation myocardial infarction (STEMI) ([place])     Shirline Frees , SPT 04/30/2018, 5:03 PM  [place], [place], [address] Phone: [phone]   Fax:  [phone]  Name: Jeffery Thomas MRN: [id number] Date of Birth: 25-Aug-1951

## 2018-05-02 ENCOUNTER — Encounter: Payer: Self-pay | Admitting: Physical Therapy

## 2018-05-02 ENCOUNTER — Ambulatory Visit: Admitting: Physical Therapy

## 2018-05-02 DIAGNOSIS — R600 Localized edema: Secondary | ICD-10-CM

## 2018-05-02 DIAGNOSIS — M25662 Stiffness of left knee, not elsewhere classified: Secondary | ICD-10-CM

## 2018-05-02 DIAGNOSIS — R262 Difficulty in walking, not elsewhere classified: Secondary | ICD-10-CM

## 2018-05-02 DIAGNOSIS — M25562 Pain in left knee: Secondary | ICD-10-CM | POA: Diagnosis not present

## 2018-05-02 NOTE — Therapy (Signed)
[facility] [place], [place], [address] Phone: [phone]   Fax:  [phone]  Physical Therapy Treatment  Patient Details  Name: Jeffery Thomas MRN: [id number] Date of Birth: 07/06/1951 Referring Provider: Wynelle Link   Encounter Date: 05/02/2018  PT End of Session - 05/02/18 1608    Visit Number  5    Date for PT Re-Evaluation  06/23/18    PT Start Time  1520    PT Stop Time  1624    PT Time Calculation (min)  64 min    Activity Tolerance  Patient limited by pain    Behavior During Therapy  [facility] for tasks assessed/performed       Past Medical History:  Diagnosis Date  . Abdominal distention   . Acute encephalopathy   . Acute respiratory failure with hypoxia ([place])   . Acute systolic congestive heart failure ([place])   . Agitation   . 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 Benign essential HTN   . Bradycardia   . Cardiac device in situ   . Encounter for central line placement   . Encounter for imaging study to confirm orogastric (OG) tube placement   . Gastroesophageal reflux disease   . HCAP (healthcare-associated pneumonia)   . History of cardiac arrest   . Hypertension   . Leukocytosis   . Prediabetes   . ST elevation myocardial infarction (STEMI) ([place])   . Streptococcal sore throat   . Sudden cardiac death [facility]) 07-23-17   Sudden cardiac death/Takatsubo  syndrome  . Takatsuki syndrome   . 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 ICD IMPLANT N/A 07/04/2017   SJM Fortify Assura VR ICD implanted by Dr Rayann Heman for secondary prevention after VF arrest  . LEFT HEART CATH AND CORONARY ANGIOGRAPHY N/A 23-Jul-2017   Procedure: LEFT HEART CATH AND CORONARY ANGIOGRAPHY;  Surgeon: Lorretta Harp, MD;  Location: [place] [facility];  Service: Cardiovascular;  Laterality: N/A;  . MENISCUS REPAIR  1983  . TOTAL KNEE REVISION Left  04/18/2018   Procedure: LEFT TOTAL KNEE REVISION;  Surgeon: Gaynelle Arabian, MD;  Location: [facility];  Service: Orthopedics;  Laterality: Left;  Adductor Block    There were no vitals filed for this visit.  Subjective Assessment - 05/02/18 1526    Subjective  Patient reports that he is feeling better, when I ask him while he is doing the nustep his pain he sasy a 1/10, I ask if he can go further and he said" I don't want to because my pain is a 1",,    Currently in Pain?  Yes    Pain Score  1     Pain Location  Knee    Pain Orientation  Left    Pain Descriptors / Indicators  Aching;Sore;Tightness                       [facility] Adult PT Treatment/Exercise - 05/02/18 0001      Ambulation/Gait   Gait Comments  worked on gait with a SPC, getting him to bear weight, heel toe cues, weight shift cues, and step length cues      Knee/Hip Exercises: Machines for Strengthening   Cybex Knee Extension  5# both legs a lot of verbalization with this due to pain 2x10    Cybex Knee Flexion  25# 2x10, then left only 10# 2x10  Knee/Hip Exercises: Standing   Heel Raises  15 reps;2 sets;1 second      Knee/Hip Exercises: Supine   Short Arc Quad Sets  Left;2 sets;10 reps;Limitations    Short Arc Quad Sets Limitations  cues needed    Other Supine Knee/Hip Exercises  bridge with ball and ball rolling with PTA overpressure      Vasopneumatic   Number Minutes Vasopneumatic   10 minutes    Vasopnuematic Location   Knee    Vasopneumatic Pressure  Medium    Vasopneumatic Temperature   33      Manual Therapy   Soft tissue mobilization  left quad     Passive ROM  flex and ext    Muscle Energy Technique  CR to increase flexion               PT Short Term Goals - 05/02/18 1611      PT SHORT TERM GOAL #1   Title  independent with initial HEP    Status  Partially Met        PT Long Term Goals - 04/23/18 1001      PT LONG TERM GOAL #1   Title  decrease pain 50%    Time  8     Period  Weeks    Status  New      PT LONG TERM GOAL #2   Title  incresae AROM to 10-110 degrees flexion    Time  8    Period  Weeks    Status  New      PT LONG TERM GOAL #3   Title  walk without device and minimal deviation    Time  8    Period  Weeks    Status  New      PT LONG TERM GOAL #4   Title  go up and down stairs step over step    Time  8    Period  Weeks    Status  New      PT LONG TERM GOAL #5   Title  perform RICE at home    Time  8    Period  Weeks    Status  New            Plan - 05/02/18 1609    Clinical Impression Statement  pateint saw MD yesterday, staple were removed, some bleeding today from one spot superior to the knee.  He is very gaurded needs a lot of cues for gait, tends to limp severely, when asked his pain level he says 1/10.  Was able to do well with cues for the [facility] and without device.  He does not allow PROM, screams out in pain with flexion    PT Next Visit Plan  continue to push ROM as he tolerates work on gait    Consulted and Agree with Plan of Care  Patient       Patient will benefit from skilled therapeutic intervention in order to improve the following deficits and impairments:  Abnormal gait, Decreased range of motion, Difficulty walking, Decreased activity tolerance, Pain, Impaired flexibility, Decreased scar mobility, Increased edema, Decreased strength, Decreased mobility  Visit Diagnosis: Acute pain of left knee  Stiffness of left knee, not elsewhere classified  Localized edema  Difficulty in walking, not elsewhere classified     Problem List Patient Active Problem List   Diagnosis Date Noted  . 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 Hyperlipidemia 10/24/2017  .  Benign essential HTN   . History of cardiac arrest   . Streptococcal sore throat   . Anoxic-ischemic encephalopathy ([place]) 07/05/2017  . Cardiac device in situ   . Gastroesophageal reflux disease   . Acute systolic congestive heart  failure ([place])   . Takatsuki syndrome   . Leukocytosis   . Prediabetes   . HCAP (healthcare-associated pneumonia)   . Agitation   . Acute encephalopathy   . Bradycardia   . Essential hypertension   . Abdominal distention   . Encounter for imaging study to confirm orogastric (OG) tube placement   . Acute respiratory failure with hypoxia ([place])   . Encounter for central line placement   . Ventricular fibrillation ([place]) Jun 27, 2017  . Sudden cardiac death ([place]) 06/27/2017  . ST elevation myocardial infarction (STEMI) ([place])     Sumner Boast., PT 05/02/2018, 4:12 PM  [place], [place], [address] Phone: [phone]   Fax:  [phone]  Name: Jeffery Thomas MRN: [id number] Date of Birth: Aug 10, 1951

## 2018-05-04 ENCOUNTER — Ambulatory Visit: Admitting: Physical Therapy

## 2018-05-04 ENCOUNTER — Encounter: Payer: Self-pay | Admitting: Physical Therapy

## 2018-05-04 DIAGNOSIS — M25562 Pain in left knee: Secondary | ICD-10-CM | POA: Diagnosis not present

## 2018-05-04 DIAGNOSIS — R600 Localized edema: Secondary | ICD-10-CM

## 2018-05-04 DIAGNOSIS — R262 Difficulty in walking, not elsewhere classified: Secondary | ICD-10-CM

## 2018-05-04 DIAGNOSIS — M25662 Stiffness of left knee, not elsewhere classified: Secondary | ICD-10-CM

## 2018-05-04 NOTE — Therapy (Signed)
[place], [place], [address] Phone: [phone]   Fax:  [phone]  Physical Therapy Treatment  Patient Details  Name: Jeffery Thomas MRN: [id number] Date of Birth: 10-Dec-1950 Referring Provider: Wynelle Link   Encounter Date: 05/04/2018  PT End of Session - 05/04/18 1122    Visit Number  6    Date for PT Re-Evaluation  06/23/18    PT Start Time  1020    PT Stop Time  1135    PT Time Calculation (min)  75 min       Past Medical History:  Diagnosis Date  . Abdominal distention   . Acute encephalopathy   . Acute respiratory failure with hypoxia ([place])   . Acute systolic congestive heart failure ([place])   . Agitation   . 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 Benign essential HTN   . Bradycardia   . Cardiac device in situ   . Encounter for central line placement   . Encounter for imaging study to confirm orogastric (OG) tube placement   . Gastroesophageal reflux disease   . HCAP (healthcare-associated pneumonia)   . History of cardiac arrest   . Hypertension   . Leukocytosis   . Prediabetes   . ST elevation myocardial infarction (STEMI) ([place])   . Streptococcal sore throat   . Sudden cardiac death [facility]) 07-12-2017   Sudden cardiac death/Takatsubo  syndrome  . Takatsuki syndrome   . Ventricular fibrillation ([place]) July 12, 2017    Past Surgical History:  Procedure Laterality Date  . ICD IMPLANT N/A 07/04/2017   SJM Fortify Assura VR ICD implanted by Dr Rayann Heman for secondary prevention after VF arrest  . LEFT HEART CATH AND CORONARY ANGIOGRAPHY N/A Jul 12, 2017   Procedure: LEFT HEART CATH AND CORONARY ANGIOGRAPHY;  Surgeon: Lorretta Harp, MD;  Location: [place] [facility];  Service: Cardiovascular;  Laterality: N/A;  . MENISCUS REPAIR  1983  . TOTAL KNEE REVISION Left 04/18/2018   Procedure: LEFT TOTAL KNEE REVISION;  Surgeon: Gaynelle Arabian, MD;  [facility];  Service:  Orthopedics;  Laterality: Left;  Adductor Block    There were no vitals filed for this visit.  Subjective Assessment - 05/04/18 1022    Subjective  amb in with RW, feeling okay    Currently in Pain?  Yes    Pain Score  2     Pain Location  Knee    Pain Orientation  Left         [facility] PT Assessment - 05/04/18 0001      AROM   Left Knee Extension  18    Left Knee Flexion  88                   [facility] Adult PT Treatment/Exercise - 05/04/18 0001      Ambulation/Gait   Gait Comments  worked on gait with RW and SPC cues for stride, knee flexion in swing and heel strike. okayed SPC fo rpractice in house only      Knee/Hip Exercises: Aerobic   Nustep  L 4 6 min - LE only      Knee/Hip Exercises: Machines for Strengthening   Cybex Knee Extension  5 times with help-painful    Cybex Knee Flexion  10# 2 sets 10. 1 set with PTA overpressure    Cybex Leg Press  30# seat # 10 10 times, # 9 10x, 30# calf raises 2 sets 10 for TKE  Knee/Hip Exercises: Seated   Long Arc Quad  Strengthening;Left;3 sets;5 reps PTA help to activate quad NOT hip flexor      Knee/Hip Exercises: Supine   Terminal Knee Extension  Left;2 sets;10 reps;Theraband    Theraband Level (Terminal Knee Extension)  Level 2 (Red)      Vasopneumatic   Number Minutes Vasopneumatic   15 minutes    Vasopnuematic Location   Knee    Vasopneumatic Pressure  Medium    Vasopneumatic Temperature   33      Manual Therapy   Manual Therapy  Passive ROM;Edema management;Soft tissue mobilization    Joint Mobilization  ext belt mob    Soft tissue mobilization  left quad/patella    Passive ROM  flex and ext    Muscle Energy Technique  CR to increase flexion             PT Education - 05/04/18 1121    Education Details  LLLD stretch for ex with 3#    Person(s) Educated  Patient;Spouse    Methods  Explanation;Demonstration    Comprehension  Verbalized understanding;Returned demonstration       PT Short  Term Goals - 05/02/18 1611      PT SHORT TERM GOAL #1   Title  independent with initial HEP    Status  Partially Met        PT Long Term Goals - 04/23/18 1001      PT LONG TERM GOAL #1   Title  decrease pain 50%    Time  8    Period  Weeks    Status  New      PT LONG TERM GOAL #2   Title  incresae AROM to 10-110 degrees flexion    Time  8    Period  Weeks    Status  New      PT LONG TERM GOAL #3   Title  walk without device and minimal deviation    Time  8    Period  Weeks    Status  New      PT LONG TERM GOAL #4   Title  go up and down stairs step over step    Time  8    Period  Weeks    Status  New      PT LONG TERM GOAL #5   Title  perform RICE at home    Time  8    Period  Weeks    Status  New            Plan - 05/04/18 1122    Clinical Impression Statement  agressive flex and ext with MT and PTA assist with machines/ex. pt pain limited but with cuing and time tolerated well. improved AROM but measurements taken after MT. pt needs add'l gait work as he has many compensatios. okayed SPC in home only for practice. focus on knee flexion in swing phase and heel strike ( wife present for instruction to help carry over)    PT Treatment/Interventions  ADLs/Self Care Home Management;Cryotherapy;Gait training;Therapeutic exercise;Therapeutic activities;Functional mobility training;Stair training;Patient/family education;Manual techniques;Vasopneumatic Device    PT Next Visit Plan  continue to push ROM as he tolerates work on gait       Patient will benefit from skilled therapeutic intervention in order to improve the following deficits and impairments:  Abnormal gait, Decreased range of motion, Difficulty walking, Decreased activity tolerance, Pain, Impaired flexibility, Decreased scar mobility, Increased edema, Decreased strength, Decreased  mobility  Visit Diagnosis: Acute pain of left knee  Stiffness of left knee, not elsewhere classified  Localized  edema  Difficulty in walking, not elsewhere classified     Problem List Patient Active Problem List   Diagnosis Date Noted  . 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 Hyperlipidemia 10/24/2017  . Benign essential HTN   . History of cardiac arrest   . Streptococcal sore throat   . Anoxic-ischemic encephalopathy ([place]) 07/05/2017  . Cardiac device in situ   . Gastroesophageal reflux disease   . Acute systolic congestive heart failure ([place])   . Takatsuki syndrome   . Leukocytosis   . Prediabetes   . HCAP (healthcare-associated pneumonia)   . Agitation   . Acute encephalopathy   . Bradycardia   . Essential hypertension   . Abdominal distention   . Encounter for imaging study to confirm orogastric (OG) tube placement   . Acute respiratory failure with hypoxia ([place])   . Encounter for central line placement   . Ventricular fibrillation ([place]) July 07, 2017  . Sudden cardiac death ([place]) 2017-07-07  . ST elevation myocardial infarction (STEMI) ([place])     Rhemi Balbach,ANGIE PTA 05/04/2018, 11:25 AM  [place], [place], [address] Phone: [phone]   Fax:  [phone]  Name: Jeffery Thomas MRN: [id number] Date of Birth: [birth year]

## 2018-05-07 ENCOUNTER — Ambulatory Visit: Payer: Medicare Other | Attending: Family Medicine | Admitting: Physical Therapy

## 2018-05-07 DIAGNOSIS — R600 Localized edema: Secondary | ICD-10-CM

## 2018-05-07 DIAGNOSIS — Z96652 Presence of left artificial knee joint: Secondary | ICD-10-CM | POA: Insufficient documentation

## 2018-05-07 DIAGNOSIS — M25562 Pain in left knee: Secondary | ICD-10-CM

## 2018-05-07 DIAGNOSIS — M25662 Stiffness of left knee, not elsewhere classified: Secondary | ICD-10-CM | POA: Insufficient documentation

## 2018-05-07 DIAGNOSIS — R262 Difficulty in walking, not elsewhere classified: Secondary | ICD-10-CM | POA: Insufficient documentation

## 2018-05-07 DIAGNOSIS — R609 Edema, unspecified: Secondary | ICD-10-CM | POA: Diagnosis not present

## 2018-05-07 NOTE — Therapy (Signed)
[place], [place], [address] Phone: [phone]   Fax:  [phone]  Physical Therapy Treatment  Patient Details  Name: Jeffery Thomas MRN: [id number] Date of Birth: [birth year] Referring Provider: Wynelle Link   Encounter Date: 05/07/2018  PT End of Session - 05/07/18 0849    Visit Number  7    Date for PT Re-Evaluation  06/23/18    PT Start Time  0849    PT Stop Time  0948    PT Time Calculation (min)  59 min    Activity Tolerance  Patient tolerated treatment well    Behavior During Therapy  [facility] for tasks assessed/performed       Past Medical History:  Diagnosis Date  . Abdominal distention   . Acute encephalopathy   . Acute respiratory failure with hypoxia ([place])   . Acute systolic congestive heart failure ([place])   . Agitation   . 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 Benign essential HTN   . Bradycardia   . Cardiac device in situ   . Encounter for central line placement   . Encounter for imaging study to confirm orogastric (OG) tube placement   . Gastroesophageal reflux disease   . HCAP (healthcare-associated pneumonia)   . History of cardiac arrest   . Hypertension   . Leukocytosis   . Prediabetes   . ST elevation myocardial infarction (STEMI) ([place])   . Streptococcal sore throat   . Sudden cardiac death [facility]) 2017/07/24   Sudden cardiac death/Takatsubo  syndrome  . Takatsuki syndrome   . Ventricular fibrillation ([place]) 07/24/17    Past Surgical History:  Procedure Laterality Date  . ICD IMPLANT N/A 07/04/2017   SJM Fortify Assura VR ICD implanted by Dr Rayann Heman for secondary prevention after VF arrest  . LEFT HEART CATH AND CORONARY ANGIOGRAPHY N/A 2017/07/24   Procedure: LEFT HEART CATH AND CORONARY ANGIOGRAPHY;  Surgeon: Lorretta Harp, MD;  Location: [place] [facility];  Service: Cardiovascular;  Laterality: N/A;  . MENISCUS REPAIR  1983  . TOTAL KNEE REVISION  Left 04/18/2018   Procedure: LEFT TOTAL KNEE REVISION;  Surgeon: Gaynelle Arabian, MD;  [facility];  Service: Orthopedics;  Laterality: Left;  Adductor Block    There were no vitals filed for this visit.  Subjective Assessment - 05/07/18 0855    Subjective  amb with SPC today    Limitations  Walking;House hold activities    Patient Stated Goals  have less pain, better motion and walk better    Currently in Pain?  Yes    Pain Score  1     Pain Location  Knee    Pain Orientation  Left    Pain Descriptors / Indicators  Aching;Sore;Tightness    Pain Type  Acute pain    Pain Onset  In the past 7 days         [facility] PT Assessment - 05/07/18 0001      AROM   Left Knee Extension  -15    Left Knee Flexion  78      PROM   Left Knee Extension  -12                   [facility] Adult PT Treatment/Exercise - 05/07/18 0001      Knee/Hip Exercises: Stretches   Active Hamstring Stretch  Left;1 rep;60 seconds prone of End of bed      Knee/Hip Exercises: Aerobic  Stationary Bike  L0 x 6 min partial revolutions    Nustep  L3 x 6 min      Knee/Hip Exercises: Standing   Heel Raises  Both;3 sets;10 reps    Forward Lunges  Left;20 reps for flexion stretch    Other Standing Knee Exercises  wt shifting onto LLE      Vasopneumatic   Number Minutes Vasopneumatic   15 minutes    Vasopnuematic Location   Knee    Vasopneumatic Pressure  Medium    Vasopneumatic Temperature   33      Manual Therapy   Manual Therapy  Joint mobilization;Passive ROM    Joint Mobilization  for flex/ext; patellar mobs    Passive ROM  flex in sitting and ext  in supine               PT Short Term Goals - 05/02/18 1611      PT SHORT TERM GOAL #1   Title  independent with initial HEP    Status  Partially Met        PT Long Term Goals - 04/23/18 1001      PT LONG TERM GOAL #1   Title  decrease pain 50%    Time  8    Period  Weeks    Status  New      PT LONG TERM GOAL #2   Title   incresae AROM to 10-110 degrees flexion    Time  8    Period  Weeks    Status  New      PT LONG TERM GOAL #3   Title  walk without device and minimal deviation    Time  8    Period  Weeks    Status  New      PT LONG TERM GOAL #4   Title  go up and down stairs step over step    Time  8    Period  Weeks    Status  New      PT LONG TERM GOAL #5   Title  perform RICE at home    Time  8    Period  Weeks    Status  New            Plan - 05/07/18 1330    Clinical Impression Statement  Patient amb with SPC today with good heel strike, but decreased stance time on LLE. tolerated manual therapy well. Small increase in extension, but flexion still significantly limited. LTGs are ongoing.    PT Frequency  3x / week    PT Duration  8 weeks    PT Treatment/Interventions  ADLs/Self Care Home Management;Cryotherapy;Gait training;Therapeutic exercise;Therapeutic activities;Functional mobility training;Stair training;Patient/family education;Manual techniques;Vasopneumatic Device    PT Next Visit Plan  continue to push ROM as he tolerates work on gait    PT Home Exercise Plan  prone knee ext stretch; standing lunges for flex    Consulted and Agree with Plan of Care  Patient       Patient will benefit from skilled therapeutic intervention in order to improve the following deficits and impairments:  Abnormal gait, Decreased range of motion, Difficulty walking, Decreased activity tolerance, Pain, Impaired flexibility, Decreased scar mobility, Increased edema, Decreased strength, Decreased mobility  Visit Diagnosis: Acute pain of left knee  Stiffness of left knee, not elsewhere classified  Localized edema  Difficulty in walking, not elsewhere classified     Problem List Patient Active Problem List  Diagnosis Date Noted  . 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 Hyperlipidemia 10/24/2017  . Benign essential HTN   . History of cardiac arrest   . Streptococcal sore  throat   . 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 Cardiac device in situ   . Gastroesophageal reflux disease   . Acute systolic congestive heart failure ([place])   . Takatsuki syndrome   . Leukocytosis   . Prediabetes   . HCAP (healthcare-associated pneumonia)   . Agitation   . Acute encephalopathy   . Bradycardia   . Essential hypertension   . Abdominal distention   . Encounter for imaging study to confirm orogastric (OG) tube placement   . Acute respiratory failure with hypoxia ([place])   . Encounter for central line placement   . Ventricular fibrillation ([place]) 07-10-17  . Sudden cardiac death ([place]) Jul 10, 2017  . ST elevation myocardial infarction (STEMI) ([place])     Nainika Newlun PT 05/07/2018, 1:33 PM  [place], [place], [address] Phone: [phone]   Fax:  [phone]  Name: Jeffery Thomas MRN: [id number] Date of Birth: 10/25/1951

## 2018-05-09 ENCOUNTER — Encounter: Payer: Self-pay | Admitting: Physical Therapy

## 2018-05-09 ENCOUNTER — Ambulatory Visit: Payer: Medicare Other | Admitting: Physical Therapy

## 2018-05-09 DIAGNOSIS — M25662 Stiffness of left knee, not elsewhere classified: Secondary | ICD-10-CM

## 2018-05-09 DIAGNOSIS — R600 Localized edema: Secondary | ICD-10-CM

## 2018-05-09 DIAGNOSIS — R262 Difficulty in walking, not elsewhere classified: Secondary | ICD-10-CM

## 2018-05-09 DIAGNOSIS — M25562 Pain in left knee: Secondary | ICD-10-CM

## 2018-05-09 NOTE — Therapy (Signed)
[place], [place], [address] Phone: [phone]   Fax:  [phone]  Physical Therapy Treatment  Patient Details  Name: Jeffery Thomas MRN: [id number] Date of Birth: [birth year] Referring Provider: Wynelle Link   Encounter Date: 05/09/2018  PT End of Session - 05/09/18 1216    Visit Number  8    Date for PT Re-Evaluation  06/23/18    PT Start Time  1130    PT Stop Time  1227    PT Time Calculation (min)  57 min    Activity Tolerance  Patient tolerated treatment well    Behavior During Therapy  [facility] for tasks assessed/performed       Past Medical History:  Diagnosis Date  . Abdominal distention   . Acute encephalopathy   . Acute respiratory failure with hypoxia ([place])   . Acute systolic congestive heart failure ([place])   . Agitation   . Anoxic-ischemic encephalopathy ([place]) 07/05/2017  . Benign essential HTN   . Bradycardia   . Cardiac device in situ   . Encounter for central line placement   . Encounter for imaging study to confirm orogastric (OG) tube placement   . Gastroesophageal reflux disease   . HCAP (healthcare-associated pneumonia)   . History of cardiac arrest   . Hypertension   . Leukocytosis   . Prediabetes   . ST elevation myocardial infarction (STEMI) ([place])   . Streptococcal sore throat   . Sudden cardiac death [facility]) 2017-07-03   Sudden cardiac death/Takatsubo  syndrome  . Takatsuki syndrome   . Ventricular fibrillation ([place]) 07-03-17    Past Surgical History:  Procedure Laterality Date  . ICD IMPLANT N/A 07/04/2017   SJM Fortify Assura VR ICD implanted by Dr Rayann Heman for secondary prevention after VF arrest  . LEFT HEART CATH AND CORONARY ANGIOGRAPHY N/A 07-03-17   Procedure: LEFT HEART CATH AND CORONARY ANGIOGRAPHY;  Surgeon: Lorretta Harp, MD;  Location: [facility];  Service: Cardiovascular;  Laterality: N/A;  . MENISCUS REPAIR  1983  . TOTAL KNEE REVISION  Left 04/18/2018   Procedure: LEFT TOTAL KNEE REVISION;  Surgeon: Gaynelle Arabian, MD;  Location: [facility];  Service: Orthopedics;  Laterality: Left;  Adductor Block    There were no vitals filed for this visit.  Subjective Assessment - 05/09/18 1132    Subjective  amb with SPC today, reports that things are fine    Currently in Pain?  No/denies    Pain Score  0-No pain                       [facility] Adult PT Treatment/Exercise - 05/09/18 0001      Knee/Hip Exercises: Aerobic   Nustep  L3 x 6 min      Knee/Hip Exercises: Machines for Strengthening   Cybex Knee Extension  5lb x10,  then some stretching 5x 10 sec     Cybex Knee Flexion  15# 2 sets 10.    Cybex Leg Press  30# seat # 10 10 times, 9 10 times, 8 10 times, 7 3 times       Knee/Hip Exercises: Supine   Quad Sets  Left;10 reps;1 set Assist from therapist       Manual Therapy   Manual Therapy  Joint mobilization;Passive ROM    Joint Mobilization  for flex/ext; patellar mobs    Passive ROM  flex in sitting and ext  in supine  PT Short Term Goals - 05/02/18 1611      PT SHORT TERM GOAL #1   Title  independent with initial HEP    Status  Partially Met        PT Long Term Goals - 04/23/18 1001      PT LONG TERM GOAL #1   Title  decrease pain 50%    Time  8    Period  Weeks    Status  New      PT LONG TERM GOAL #2   Title  incresae AROM to 10-110 degrees flexion    Time  8    Period  Weeks    Status  New      PT LONG TERM GOAL #3   Title  walk without device and minimal deviation    Time  8    Period  Weeks    Status  New      PT LONG TERM GOAL #4   Title  go up and down stairs step over step    Time  8    Period  Weeks    Status  New      PT LONG TERM GOAL #5   Title  perform RICE at home    Time  8    Period  Weeks    Status  New            Plan - 05/09/18 1219    Clinical Impression Statement  Good carry over from last treatment ambulating with SPC. Pain  during MT but pt able to tolerated. Pt did well with progressive lowering on leg press. L knee ROM remains limited. Pt unable to do a SAQ actively, requires assist from therapist.     Rehab Potential  Good    PT Frequency  3x / week    PT Treatment/Interventions  ADLs/Self Care Home Management;Cryotherapy;Gait training;Therapeutic exercise;Therapeutic activities;Functional mobility training;Stair training;Patient/family education;Manual techniques;Vasopneumatic Device    PT Next Visit Plan  continue to push ROM as he tolerates work on gait       Patient will benefit from skilled therapeutic intervention in order to improve the following deficits and impairments:  Abnormal gait, Decreased range of motion, Difficulty walking, Decreased activity tolerance, Pain, Impaired flexibility, Decreased scar mobility, Increased edema, Decreased strength, Decreased mobility  Visit Diagnosis: Acute pain of left knee  Stiffness of left knee, not elsewhere classified  Localized edema  Difficulty in walking, not elsewhere classified     Problem List Patient Active Problem List   Diagnosis Date Noted  . 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 Hyperlipidemia 10/24/2017  . Benign essential HTN   . History of cardiac arrest   . Streptococcal sore throat   . Anoxic-ischemic encephalopathy ([place]) 07/05/2017  . Cardiac device in situ   . Gastroesophageal reflux disease   . Acute systolic congestive heart failure ([place])   . Takatsuki syndrome   . Leukocytosis   . Prediabetes   . HCAP (healthcare-associated pneumonia)   . Agitation   . Acute encephalopathy   . Bradycardia   . Essential hypertension   . Abdominal distention   . Encounter for imaging study to confirm orogastric (OG) tube placement   . Acute respiratory failure with hypoxia ([place])   . Encounter for central line placement   . Ventricular fibrillation ([place]) 07/14/2017  . Sudden cardiac death ([place]) 2017/07/14  . ST elevation  myocardial infarction (STEMI) [facility])     Scot Jun 05/09/2018,  12:25 PM  [place], [place], [address] Phone: [phone]   Fax:  [phone]  Name: Jeffery Thomas MRN: [id number] Date of Birth: May 17, 1951

## 2018-05-14 ENCOUNTER — Ambulatory Visit: Payer: Medicare Other | Admitting: Physical Therapy

## 2018-05-14 ENCOUNTER — Encounter: Payer: Self-pay | Admitting: Physical Therapy

## 2018-05-14 DIAGNOSIS — M25562 Pain in left knee: Secondary | ICD-10-CM | POA: Diagnosis not present

## 2018-05-14 DIAGNOSIS — M25662 Stiffness of left knee, not elsewhere classified: Secondary | ICD-10-CM

## 2018-05-14 DIAGNOSIS — R262 Difficulty in walking, not elsewhere classified: Secondary | ICD-10-CM

## 2018-05-14 NOTE — Therapy (Signed)
[place], [place], [address] Phone: [phone]   Fax:  [phone]  Physical Therapy Treatment  Patient Details  Name: Jeffery Thomas MRN: [id number] Date of Birth: 02-18-1951 Referring Provider: Wynelle Link   Encounter Date: 05/14/2018  PT End of Session - 05/14/18 1648    Visit Number  9    Date for PT Re-Evaluation  06/23/18    PT Start Time  1600    PT Stop Time  1701    PT Time Calculation (min)  61 min    Activity Tolerance  Patient tolerated treatment well    Behavior During Therapy  [facility] for tasks assessed/performed       Past Medical History:  Diagnosis Date  . Abdominal distention   . Acute encephalopathy   . Acute respiratory failure with hypoxia ([place])   . Acute systolic congestive heart failure ([place])   . Agitation   . 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 Benign essential HTN   . Bradycardia   . Cardiac device in situ   . Encounter for central line placement   . Encounter for imaging study to confirm orogastric (OG) tube placement   . Gastroesophageal reflux disease   . HCAP (healthcare-associated pneumonia)   . History of cardiac arrest   . Hypertension   . Leukocytosis   . Prediabetes   . ST elevation myocardial infarction (STEMI) ([place])   . Streptococcal sore throat   . Sudden cardiac death [facility]) July 07, 2017   Sudden cardiac death/Takatsubo  syndrome  . Takatsuki syndrome   . Ventricular fibrillation ([place]) 07-07-17    Past Surgical History:  Procedure Laterality Date  . ICD IMPLANT N/A 07/04/2017   SJM Fortify Assura VR ICD implanted by Dr Rayann Heman for secondary prevention after VF arrest  . LEFT HEART CATH AND CORONARY ANGIOGRAPHY N/A Jul 07, 2017   Procedure: LEFT HEART CATH AND CORONARY ANGIOGRAPHY;  Surgeon: Lorretta Harp, MD;  Location: [facility];  Service: Cardiovascular;  Laterality: N/A;  . MENISCUS REPAIR  1983  . TOTAL KNEE REVISION  Left 04/18/2018   Procedure: LEFT TOTAL KNEE REVISION;  Surgeon: Gaynelle Arabian, MD;  [facility];  Service: Orthopedics;  Laterality: Left;  Adductor Block    There were no vitals filed for this visit.  Subjective Assessment - 05/14/18 1559    Subjective  amb with SPC today, "I am doing a lot of walking around"    Currently in Pain?  No/denies    Pain Score  0-No pain                       [facility] Adult PT Treatment/Exercise - 05/14/18 0001      Ambulation/Gait   Gait Comments  worked on gait without AD, decrease stance time on LLE. Cues to perfrome heel strike and to toe off.  L anke pronated during stance phase      Knee/Hip Exercises: Aerobic   Nustep  L3 x 7 min      Knee/Hip Exercises: Machines for Strengthening   Cybex Knee Extension  5lb x10,  then some stretching 5x 10 sec     Cybex Knee Flexion  20# 2 sets 10.    Cybex Leg Press  30# seat #9 10 times, 8 10 times, 8 10 times, 7 10 times, LLE no weight working on flexion x5       Knee/Hip Exercises: Standing   Other Standing Knee  Exercises  sit to stand X10 keeping LLE back.       Vasopneumatic   Number Minutes Vasopneumatic   15 minutes    Vasopnuematic Location   Knee    Vasopneumatic Pressure  Medium    Vasopneumatic Temperature   33      Manual Therapy   Manual Therapy  Joint mobilization;Passive ROM    Joint Mobilization  for flex; grades 3-4    Passive ROM  Extension in supine                PT Short Term Goals - 05/02/18 1611      PT SHORT TERM GOAL #1   Title  independent with initial HEP    Status  Partially Met        PT Long Term Goals - 04/23/18 1001      PT LONG TERM GOAL #1   Title  decrease pain 50%    Time  8    Period  Weeks    Status  New      PT LONG TERM GOAL #2   Title  incresae AROM to 10-110 degrees flexion    Time  8    Period  Weeks    Status  New      PT LONG TERM GOAL #3   Title  walk without device and minimal deviation    Time  8     Period  Weeks    Status  New      PT LONG TERM GOAL #4   Title  go up and down stairs step over step    Time  8    Period  Weeks    Status  New      PT LONG TERM GOAL #5   Title  perform RICE at home    Time  8    Period  Weeks    Status  New            Plan - 05/14/18 1649    Clinical Impression Statement  Pt continues to have knee pain at end ranges. Ambulates with L knee flexed causing limp. Tolerated increase flexion on leg press. Difficulty keeping LLE in contact with pad during bilat seated extension. reports pain when trying yo actively extend LLE.    Rehab Potential  Good    PT Frequency  3x / week    PT Treatment/Interventions  ADLs/Self Care Home Management;Cryotherapy;Gait training;Therapeutic exercise;Therapeutic activities;Functional mobility training;Stair training;Patient/family education;Manual techniques;Vasopneumatic Device    PT Next Visit Plan  continue to push ROM as he tolerates work on gait       Patient will benefit from skilled therapeutic intervention in order to improve the following deficits and impairments:  Abnormal gait, Decreased range of motion, Difficulty walking, Decreased activity tolerance, Pain, Impaired flexibility, Decreased scar mobility, Increased edema, Decreased strength, Decreased mobility  Visit Diagnosis: Acute pain of left knee  Stiffness of left knee, not elsewhere classified  Difficulty in walking, not elsewhere classified     Problem List Patient Active Problem List   Diagnosis Date Noted  . 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 Hyperlipidemia 10/24/2017  . Benign essential HTN   . History of cardiac arrest   . Streptococcal sore throat   . 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 Cardiac device in situ   . Gastroesophageal reflux disease   . Acute systolic congestive heart failure ([place])   . Takatsuki syndrome   . Leukocytosis   .  Prediabetes   . HCAP (healthcare-associated pneumonia)    . Agitation   . Acute encephalopathy   . Bradycardia   . Essential hypertension   . Abdominal distention   . Encounter for imaging study to confirm orogastric (OG) tube placement   . Acute respiratory failure with hypoxia ([place])   . Encounter for central line placement   . Ventricular fibrillation ([place]) 07/06/17  . Sudden cardiac death ([place]) July 06, 2017  . ST elevation myocardial infarction (STEMI) ([place])     Scot Jun, PTA 05/14/2018, 4:53 PM  [place], [place], [address] Phone: [phone]   Fax:  [phone]  Name: DENNIS HEGEMAN MRN: [id number] Date of Birth: December 27, 1950

## 2018-05-16 ENCOUNTER — Ambulatory Visit: Payer: Medicare Other | Admitting: Physical Therapy

## 2018-05-16 ENCOUNTER — Encounter: Payer: Self-pay | Admitting: Physical Therapy

## 2018-05-16 DIAGNOSIS — M25662 Stiffness of left knee, not elsewhere classified: Secondary | ICD-10-CM

## 2018-05-16 DIAGNOSIS — M25562 Pain in left knee: Secondary | ICD-10-CM

## 2018-05-16 DIAGNOSIS — R262 Difficulty in walking, not elsewhere classified: Secondary | ICD-10-CM

## 2018-05-16 DIAGNOSIS — R600 Localized edema: Secondary | ICD-10-CM

## 2018-05-16 NOTE — Therapy (Signed)
[place], [place], [address] Phone: [phone]   Fax:  [phone]  Physical Therapy Treatment Progress Note Reporting Period 04/23/18 to 05/16/18 for the first 10 visits  See note below for Objective Data and Assessment of Progress/Goals.      Patient Details  Name: Jeffery Thomas MRN: [id number] Date of Birth: 1951/07/27 Referring Provider: Wynelle Link   Encounter Date: 05/16/2018  PT End of Session - 05/16/18 0853    Visit Number  10    Date for PT Re-Evaluation  06/23/18    PT Start Time  0758    PT Stop Time  0900    PT Time Calculation (min)  62 min    Activity Tolerance  Patient tolerated treatment well;Patient limited by pain    Behavior During Therapy  [facility] for tasks assessed/performed       Past Medical History:  Diagnosis Date  . Abdominal distention   . Acute encephalopathy   . Acute respiratory failure with hypoxia ([place])   . Acute systolic congestive heart failure ([place])   . Agitation   . 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 Benign essential HTN   . Bradycardia   . Cardiac device in situ   . Encounter for central line placement   . Encounter for imaging study to confirm orogastric (OG) tube placement   . Gastroesophageal reflux disease   . HCAP (healthcare-associated pneumonia)   . History of cardiac arrest   . Hypertension   . Leukocytosis   . Prediabetes   . ST elevation myocardial infarction (STEMI) ([place])   . Streptococcal sore throat   . Sudden cardiac death [facility]) 07/26/2017   Sudden cardiac death/Takatsubo  syndrome  . Takatsuki syndrome   . Ventricular fibrillation ([place]) Jul 26, 2017    Past Surgical History:  Procedure Laterality Date  . ICD IMPLANT N/A 07/04/2017   SJM Fortify Assura VR ICD implanted by Dr Rayann Heman for secondary prevention after VF arrest  . LEFT HEART CATH AND CORONARY ANGIOGRAPHY N/A 2017/07/26   Procedure: LEFT HEART CATH AND  CORONARY ANGIOGRAPHY;  Surgeon: Lorretta Harp, MD;  Location: [facility];  Service: Cardiovascular;  Laterality: N/A;  . MENISCUS REPAIR  1983  . TOTAL KNEE REVISION Left 04/18/2018   Procedure: LEFT TOTAL KNEE REVISION;  Surgeon: Gaynelle Arabian, MD;  [facility];  Service: Orthopedics;  Laterality: Left;  Adductor Block    There were no vitals filed for this visit.  Subjective Assessment - 05/16/18 0805    Subjective  Pt reports that he was stiff this morning. Stated that he had difficulty extending his knee after last session.     Currently in Pain?  Yes    Pain Score  3     Pain Location  Knee    Pain Orientation  Right                       [facility] Adult PT Treatment/Exercise - 05/16/18 0001      Knee/Hip Exercises: Aerobic   Nustep  L3 x 7 min      Knee/Hip Exercises: Machines for Strengthening   Cybex Knee Flexion  20# 2 sets 10.    Cybex Leg Press  20lb 2x10, LLE TKE 20lb 2x10       Knee/Hip Exercises: Seated   Long Arc Quad  Left;1 set;10 reps      Vasopneumatic   Number Minutes Vasopneumatic  15 minutes    Vasopnuematic Location   Knee    Vasopneumatic Pressure  Medium    Vasopneumatic Temperature   33      Manual Therapy   Manual Therapy  Passive ROM    Passive ROM  Extension in supine                PT Short Term Goals - 05/02/18 1611      PT SHORT TERM GOAL #1   Title  independent with initial HEP    Status  Partially Met        PT Long Term Goals - 04/23/18 1001      PT LONG TERM GOAL #1   Title  decrease pain 50%    Time  8    Period  Weeks    Status  New      PT LONG TERM GOAL #2   Title  incresae AROM to 10-110 degrees flexion    Time  8    Period  Weeks    Status  New      PT LONG TERM GOAL #3   Title  walk without device and minimal deviation    Time  8    Period  Weeks    Status  New      PT LONG TERM GOAL #4   Title  go up and down stairs step over step    Time  8    Period  Weeks     Status  New      PT LONG TERM GOAL #5   Title  perform RICE at home    Time  [place] - 05/16/18 0854    Clinical Impression Statement  Backed session down due to pt reports of not being able to straighten his knee. Pt very guarded during PROM making it difficult to assess jt mobility. L knee extension was 15 is it did not regress. L knee is warm and swollen overall. Able to perform TKE on leg press with available ROM.    Rehab Potential  Good    PT Frequency  3x / week    PT Treatment/Interventions  ADLs/Self Care Home Management;Cryotherapy;Gait training;Therapeutic exercise;Therapeutic activities;Functional mobility training;Stair training;Patient/family education;Manual techniques;Vasopneumatic Device    PT Next Visit Plan  continue to push ROM as he tolerates work on gait       Patient will benefit from skilled therapeutic intervention in order to improve the following deficits and impairments:  Abnormal gait, Decreased range of motion, Difficulty walking, Decreased activity tolerance, Pain, Impaired flexibility, Decreased scar mobility, Increased edema, Decreased strength, Decreased mobility  Visit Diagnosis: Acute pain of left knee  Stiffness of left knee, not elsewhere classified  Difficulty in walking, not elsewhere classified  Localized edema     Problem List Patient Active Problem List   Diagnosis Date Noted  . 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 Hyperlipidemia 10/24/2017  . Benign essential HTN   . History of cardiac arrest   . Streptococcal sore throat   . Anoxic-ischemic encephalopathy ([place]) 07/05/2017  . Cardiac device in situ   . Gastroesophageal reflux disease   . Acute systolic congestive heart failure ([place])   . Takatsuki syndrome   . Leukocytosis   . Prediabetes   . HCAP (healthcare-associated pneumonia)   . Agitation   . Acute encephalopathy   .  Bradycardia   . Essential hypertension    . Abdominal distention   . Encounter for imaging study to confirm orogastric (OG) tube placement   . Acute respiratory failure with hypoxia ([place])   . Encounter for central line placement   . Ventricular fibrillation ([place]) 07-11-2017  . Sudden cardiac death ([place]) 07-11-17  . ST elevation myocardial infarction (STEMI) ([place])     Scot Jun, PTA 05/16/2018, 8:56 AM  [place], [place], [address] Phone: [phone]   Fax:  [phone]  Name: ISACC TURNEY MRN: [id number] Date of Birth: [birth year]

## 2018-05-18 ENCOUNTER — Ambulatory Visit: Payer: Medicare Other | Admitting: Physical Therapy

## 2018-05-18 DIAGNOSIS — R262 Difficulty in walking, not elsewhere classified: Secondary | ICD-10-CM

## 2018-05-18 DIAGNOSIS — R41844 Frontal lobe and executive function deficit: Secondary | ICD-10-CM

## 2018-05-18 DIAGNOSIS — M25662 Stiffness of left knee, not elsewhere classified: Secondary | ICD-10-CM

## 2018-05-18 DIAGNOSIS — I6782 Cerebral ischemia: Secondary | ICD-10-CM

## 2018-05-18 DIAGNOSIS — G931 Anoxic brain damage, not elsewhere classified: Secondary | ICD-10-CM

## 2018-05-18 DIAGNOSIS — R2681 Unsteadiness on feet: Secondary | ICD-10-CM

## 2018-05-18 DIAGNOSIS — M25562 Pain in left knee: Secondary | ICD-10-CM

## 2018-05-18 DIAGNOSIS — R600 Localized edema: Secondary | ICD-10-CM

## 2018-05-18 DIAGNOSIS — R41841 Cognitive communication deficit: Secondary | ICD-10-CM

## 2018-05-18 DIAGNOSIS — M6281 Muscle weakness (generalized): Secondary | ICD-10-CM

## 2018-05-18 NOTE — Therapy (Signed)
[place], [place], [address] Phone: [phone]   Fax:  [phone]  Physical Therapy Treatment  Patient Details  Name: Jeffery Thomas MRN: [id number] Date of Birth: 1951/05/19 Referring Provider: Wynelle Link   Encounter Date: 05/18/2018  PT End of Session - 05/18/18 0906    Visit Number  11    Date for PT Re-Evaluation  06/23/18    PT Start Time  0800    PT Stop Time  0905    PT Time Calculation (min)  65 min    Activity Tolerance  Patient tolerated treatment well;Patient limited by pain    Behavior During Therapy  [facility] for tasks assessed/performed       Past Medical History:  Diagnosis Date  . Abdominal distention   . Acute encephalopathy   . Acute respiratory failure with hypoxia ([place])   . Acute systolic congestive heart failure ([place])   . Agitation   . Anoxic-ischemic encephalopathy ([place]) 07/05/2017  . Benign essential HTN   . Bradycardia   . Cardiac device in situ   . Encounter for central line placement   . Encounter for imaging study to confirm orogastric (OG) tube placement   . Gastroesophageal reflux disease   . HCAP (healthcare-associated pneumonia)   . History of cardiac arrest   . Hypertension   . Leukocytosis   . Prediabetes   . ST elevation myocardial infarction (STEMI) ([place])   . Streptococcal sore throat   . Sudden cardiac death [facility]) 07-20-17   Sudden cardiac death/Takatsubo  syndrome  . Takatsuki syndrome   . Ventricular fibrillation ([place]) July 20, 2017    Past Surgical History:  Procedure Laterality Date  . ICD IMPLANT N/A 07/04/2017   SJM Fortify Assura VR ICD implanted by Dr Rayann Heman for secondary prevention after VF arrest  . LEFT HEART CATH AND CORONARY ANGIOGRAPHY N/A 2017/07/20   Procedure: LEFT HEART CATH AND CORONARY ANGIOGRAPHY;  Surgeon: Lorretta Harp, MD;  Location: [place] CV LAB;  Service: Cardiovascular;  Laterality: N/A;  . MENISCUS REPAIR  1983   . TOTAL KNEE REVISION Left 04/18/2018   Procedure: LEFT TOTAL KNEE REVISION;  Surgeon: Gaynelle Arabian, MD;  [facility];  Service: Orthopedics;  Laterality: Left;  Adductor Block    There were no vitals filed for this visit.  Subjective Assessment - 05/18/18 0806    Subjective  Pt reports that the knee is "doing pretty good, "feels like the knee is coming around finally." Pt entered facility ambulating on [facility] with no heel strike and in hip flexion.     Limitations  Walking;House hold activities    Pain Location  Knee    Pain Orientation  Left         [facility] PT Assessment - 05/18/18 0001      AROM   Left Knee Extension  25 seated at end of session    Left Knee Flexion  95                   [facility] Adult PT Treatment/Exercise - 05/18/18 0001      Ambulation/Gait   Gait Comments  worked on gait without AD, down and back facility hallway, cueing for heel strike and shorter steps to discourage swinging of LLE      Knee/Hip Exercises: Aerobic   Nustep  L3 x 7 min      Knee/Hip Exercises: Machines for Strengthening   Cybex Knee Extension  5lb x10  Bilateral, 5lb x LLE x 10      Knee/Hip Exercises: Standing   Terminal Knee Extension  2 sets;10 reps;AROM;Theraband yellow, standing     Forward Step Up  3 sets;Hand Hold: 1;Step Height: 4";Left;5 reps      Vasopneumatic   Number Minutes Vasopneumatic   15 minutes    Vasopnuematic Location   Knee    Vasopneumatic Pressure  Medium    Vasopneumatic Temperature   33      Manual Therapy   Manual Therapy  Joint mobilization    Passive ROM  Extension in seated with belt mob downward thrust with foot on belt             PT Education - 05/18/18 0859    Education Details  Reviewed positioning for extension at home with foot propped on table with small weight and ice on top of knee at home. Encouraged and instructed to avoid putting pillows under knee in recliner. Educated pt in walking with proper heel strike to  improve gait and decrease reliance of AD.     Person(s) Educated  Patient;Spouse    Methods  Explanation;Demonstration;Tactile cues    Comprehension  Verbalized understanding       PT Short Term Goals - 05/02/18 1611      PT SHORT TERM GOAL #1   Title  independent with initial HEP    Status  Partially Met        PT Long Term Goals - 04/23/18 1001      PT LONG TERM GOAL #1   Title  decrease pain 50%    Time  8    Period  Weeks    Status  New      PT LONG TERM GOAL #2   Title  incresae AROM to 10-110 degrees flexion    Time  8    Period  Weeks    Status  New      PT LONG TERM GOAL #3   Title  walk without device and minimal deviation    Time  8    Period  Weeks    Status  New      PT LONG TERM GOAL #4   Title  go up and down stairs step over step    Time  8    Period  Weeks    Status  New      PT LONG TERM GOAL #5   Title  perform RICE at home    Time  8    Period  Weeks    Status  New            Plan - 05/18/18 0907    Clinical Impression Statement  Focused on extension entire treatment. Pt tolerated seated knee mob well, but remains very guarded during PROM. Pt requires cueing to heel strike and load left leg. Left knee remains warm and swollen. Pt spouse reports him sitting in recliner with pillow under knee while legs elevated. Pt did very well with TKE with theraband in standing.     Rehab Potential  Good    PT Frequency  3x / week    PT Treatment/Interventions  ADLs/Self Care Home Management;Cryotherapy;Gait training;Therapeutic exercise;Therapeutic activities;Functional mobility training;Stair training;Patient/family education;Manual techniques;Vasopneumatic Device    PT Next Visit Plan  continue to push ROM as he tolerates work on gait, assess pt carry over from previous session in regard to extension and continue ROM        Patient will  benefit from skilled therapeutic intervention in order to improve the following deficits and impairments:   Abnormal gait, Decreased range of motion, Difficulty walking, Decreased activity tolerance, Pain, Impaired flexibility, Decreased scar mobility, Increased edema, Decreased strength, Decreased mobility  Visit Diagnosis: Acute pain of left knee  Stiffness of left knee, not elsewhere classified  Difficulty in walking, not elsewhere classified  Localized edema  Cognitive communication deficit  Frontal lobe and executive function deficit  Muscle weakness (generalized)  Unsteadiness on feet  Anoxic-ischemic encephalopathy [facility])     Problem List Patient Active Problem List   Diagnosis Date Noted  . 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 Hyperlipidemia 10/24/2017  . Benign essential HTN   . History of cardiac arrest   . Streptococcal sore throat   . Anoxic-ischemic encephalopathy ([place]) 07/05/2017  . Cardiac device in situ   . Gastroesophageal reflux disease   . Acute systolic congestive heart failure ([place])   . Takatsuki syndrome   . Leukocytosis   . Prediabetes   . HCAP (healthcare-associated pneumonia)   . Agitation   . Acute encephalopathy   . Bradycardia   . Essential hypertension   . Abdominal distention   . Encounter for imaging study to confirm orogastric (OG) tube placement   . Acute respiratory failure with hypoxia ([place])   . Encounter for central line placement   . Ventricular fibrillation ([place]) 2017/07/20  . Sudden cardiac death ([place]) 20-Jul-2017  . ST elevation myocardial infarction (STEMI) [facility])     Maryelizabeth Kaufmann, SPTA 05/18/2018, 9:19 AM  [place], [place], [address] Phone: [phone]   Fax:  [phone]  Name: BRALON ANTKOWIAK MRN: [id number] Date of Birth: 10/06/1951

## 2018-05-21 ENCOUNTER — Encounter: Payer: Self-pay | Admitting: Physical Therapy

## 2018-05-21 ENCOUNTER — Ambulatory Visit (INDEPENDENT_AMBULATORY_CARE_PROVIDER_SITE_OTHER): Payer: Medicare Other

## 2018-05-21 ENCOUNTER — Ambulatory Visit: Payer: Medicare Other | Admitting: Physical Therapy

## 2018-05-21 DIAGNOSIS — M25662 Stiffness of left knee, not elsewhere classified: Secondary | ICD-10-CM

## 2018-05-21 DIAGNOSIS — M25562 Pain in left knee: Secondary | ICD-10-CM | POA: Diagnosis not present

## 2018-05-21 DIAGNOSIS — I428 Other cardiomyopathies: Secondary | ICD-10-CM | POA: Diagnosis not present

## 2018-05-21 DIAGNOSIS — Z95810 Presence of automatic (implantable) cardiac defibrillator: Secondary | ICD-10-CM

## 2018-05-21 DIAGNOSIS — R262 Difficulty in walking, not elsewhere classified: Secondary | ICD-10-CM

## 2018-05-21 DIAGNOSIS — R600 Localized edema: Secondary | ICD-10-CM

## 2018-05-21 NOTE — Progress Notes (Signed)
EPIC Encounter for ICM Monitoring  Patient Name: Jeffery Thomas is a 67 y.o. male Date: 05/21/2018 Primary Care Physican: Bernerd Limbo, MD Primary Cardiologist:Berry Electrophysiologist:Allred Dry Weight:Previous weight202lbs     Attempted call to patient and unable to reach.  Left message to return call.  Transmission reviewed.    Thoracic impedance abnormal suggesting fluid accumulation starting 05/20/2018.  Also decreased impedance 05/13/2018 - 05/18/2018.  Recommendations: NONE - Unable to reach.  Follow-up plan: ICM clinic phone appointment on 05/29/2018 to recheck fluid levels.     Copy of ICM check sent to Dr. Rayann Heman and Dr Gwenlyn Found.   3 month ICM trend: 05/21/2018    1 Year ICM trend:       Rosalene Billings, RN 05/21/2018 8:45 AM

## 2018-05-21 NOTE — Therapy (Signed)
[place], [place], [address] Phone: [phone]   Fax:  [phone]  Physical Therapy Treatment  Patient Details  Name: Jeffery Thomas MRN: [id number] Date of Birth: [birth year] Referring Provider: Wynelle Link   Encounter Date: 05/21/2018  PT End of Session - 05/21/18 0929    Visit Number  12    Date for PT Re-Evaluation  06/23/18    PT Start Time  0835    PT Stop Time  0935    PT Time Calculation (min)  60 min    Activity Tolerance  Patient tolerated treatment well;Patient limited by pain    Behavior During Therapy  [facility] for tasks assessed/performed       Past Medical History:  Diagnosis Date  . Abdominal distention   . Acute encephalopathy   . Acute respiratory failure with hypoxia ([place])   . Acute systolic congestive heart failure ([place])   . Agitation   . 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 Benign essential HTN   . Bradycardia   . Cardiac device in situ   . Encounter for central line placement   . Encounter for imaging study to confirm orogastric (OG) tube placement   . Gastroesophageal reflux disease   . HCAP (healthcare-associated pneumonia)   . History of cardiac arrest   . Hypertension   . Leukocytosis   . Prediabetes   . ST elevation myocardial infarction (STEMI) ([place])   . Streptococcal sore throat   . Sudden cardiac death [facility]) 07/10/2017   Sudden cardiac death/Takatsubo  syndrome  . Takatsuki syndrome   . Ventricular fibrillation ([place]) 2017-07-10    Past Surgical History:  Procedure Laterality Date  . ICD IMPLANT N/A 07/04/2017   SJM Fortify Assura VR ICD implanted by Dr Rayann Heman for secondary prevention after VF arrest  . LEFT HEART CATH AND CORONARY ANGIOGRAPHY N/A 07/10/2017   Procedure: LEFT HEART CATH AND CORONARY ANGIOGRAPHY;  Surgeon: Lorretta Harp, MD;  Location: [employer] CV LAB;  Service: Cardiovascular;  Laterality: N/A;  . MENISCUS REPAIR  1983   . TOTAL KNEE REVISION Left 04/18/2018   Procedure: LEFT TOTAL KNEE REVISION;  Surgeon: Gaynelle Arabian, MD;  [facility];  Service: Orthopedics;  Laterality: Left;  Adductor Block    There were no vitals filed for this visit.  Subjective Assessment - 05/21/18 0841    Subjective  Reports that he is doing okay.  "Sore"    Currently in Pain?  Yes    Pain Score  2     Pain Location  Knee    Pain Orientation  Left         [facility] PT Assessment - 05/21/18 0001      AROM   Left Knee Extension  25    Left Knee Flexion  95      PROM   Left Knee Extension  10    Left Knee Flexion  105                   [facility] Adult PT Treatment/Exercise - 05/21/18 0001      Ambulation/Gait   Gait Comments  gait around the building, no device.      Knee/Hip Exercises: Stretches   Gastroc Stretch  Left;3 reps;20 seconds;Limitations    Gastroc Stretch Limitations  PT overpressure on the knee for better knee extnesion      Knee/Hip Exercises: Aerobic   Tread Mill  fwd and backwd with it  off him pushing to get more quad and better extension, needed some assist to push backward    Nustep  L3 x 7 min      Knee/Hip Exercises: Machines for Strengthening   Cybex Knee Extension  5# left only    Cybex Knee Flexion  25# with PT stretch at end range      Knee/Hip Exercises: Supine   Quad Sets  Left;3 sets;5 sets    Short Arc Quad Sets  Left;3 sets;10 reps;Limitations    Short Arc Quad Sets Limitations  3#      Vasopneumatic   Number Minutes Vasopneumatic   15 minutes    Vasopnuematic Location   Knee    Vasopneumatic Pressure  Medium    Vasopneumatic Temperature   33      Manual Therapy   Manual Therapy  Joint mobilization    Joint Mobilization  joint distraction    Passive ROM  Extension in seated with belt mob downward thrust with foot on belt               PT Short Term Goals - 05/02/18 1611      PT SHORT TERM GOAL #1   Title  independent with initial HEP    Status   Partially Met        PT Long Term Goals - 05/21/18 0932      PT LONG TERM GOAL #1   Title  decrease pain 50%    Status  Partially Met      PT LONG TERM GOAL #2   Title  incresae AROM to 10-110 degrees flexion    Status  Partially Met      PT LONG TERM GOAL #3   Title  walk without device and minimal deviation    Status  On-going      PT LONG TERM GOAL #4   Title  go up and down stairs step over step    Status  On-going      PT LONG TERM GOAL #5   Title  perform RICE at home    Status  Achieved            Plan - 05/21/18 0930    Clinical Impression Statement  Patient is doing better overall, his biggest issu is a lack of extension, he has a very bad gait pattern, he reports to [place] that this has been his normal for "years".  He has tolerated the PROM better recently but hsa been very gaurded and resistant from the beginning with this.    PT Frequency  3x / week    PT Duration  4 weeks    PT Treatment/Interventions  ADLs/Self Care Home Management;Cryotherapy;Gait training;Therapeutic exercise;Therapeutic activities;Functional mobility training;Stair training;Patient/family education;Manual techniques;Vasopneumatic Device    PT Next Visit Plan  continue to push ROM as he tolerates work on gait, assess pt carry over from previous session in regard to extension and continue ROM     Consulted and Agree with Plan of Care  Patient       Patient will benefit from skilled therapeutic intervention in order to improve the following deficits and impairments:  Abnormal gait, Decreased range of motion, Difficulty walking, Decreased activity tolerance, Pain, Impaired flexibility, Decreased scar mobility, Increased edema, Decreased strength, Decreased mobility  Visit Diagnosis: Acute pain of left knee  Stiffness of left knee, not elsewhere classified  Difficulty in walking, not elsewhere classified  Localized edema     Problem List Patient Active Problem List  Diagnosis Date  Noted  . 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 Hyperlipidemia 10/24/2017  . Benign essential HTN   . History of cardiac arrest   . Streptococcal sore throat   . 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 Cardiac device in situ   . Gastroesophageal reflux disease   . Acute systolic congestive heart failure ([place])   . Takatsuki syndrome   . Leukocytosis   . Prediabetes   . HCAP (healthcare-associated pneumonia)   . Agitation   . Acute encephalopathy   . Bradycardia   . Essential hypertension   . Abdominal distention   . Encounter for imaging study to confirm orogastric (OG) tube placement   . Acute respiratory failure with hypoxia ([place])   . Encounter for central line placement   . Ventricular fibrillation ([place]) 07-20-2017  . Sudden cardiac death ([place]) Jul 20, 2017  . ST elevation myocardial infarction (STEMI) [facility])     Sumner Boast., PT 05/21/2018, 9:33 AM  [place], [place], [address] Phone: [phone]   Fax:  [phone]  Name: Jeffery Thomas MRN: [id number] Date of Birth: 22-Feb-1951

## 2018-05-23 ENCOUNTER — Ambulatory Visit: Payer: Medicare Other | Admitting: Physical Therapy

## 2018-05-23 DIAGNOSIS — I6782 Cerebral ischemia: Secondary | ICD-10-CM

## 2018-05-23 DIAGNOSIS — M6281 Muscle weakness (generalized): Secondary | ICD-10-CM

## 2018-05-23 DIAGNOSIS — R41841 Cognitive communication deficit: Secondary | ICD-10-CM

## 2018-05-23 DIAGNOSIS — R600 Localized edema: Secondary | ICD-10-CM

## 2018-05-23 DIAGNOSIS — M25562 Pain in left knee: Secondary | ICD-10-CM

## 2018-05-23 DIAGNOSIS — R41844 Frontal lobe and executive function deficit: Secondary | ICD-10-CM

## 2018-05-23 DIAGNOSIS — G931 Anoxic brain damage, not elsewhere classified: Secondary | ICD-10-CM

## 2018-05-23 DIAGNOSIS — R262 Difficulty in walking, not elsewhere classified: Secondary | ICD-10-CM

## 2018-05-23 DIAGNOSIS — M25662 Stiffness of left knee, not elsewhere classified: Secondary | ICD-10-CM

## 2018-05-23 DIAGNOSIS — R2681 Unsteadiness on feet: Secondary | ICD-10-CM

## 2018-05-23 NOTE — Therapy (Signed)
[place], [place], [address] Phone: [phone]   Fax:  [phone]  Physical Therapy Treatment  Patient Details  Name: Jeffery Thomas MRN: [id number] Date of Birth: [birth year] Referring Provider: Wynelle Link   Encounter Date: 05/23/2018  PT End of Session - 05/23/18 0844    Visit Number  13    Date for PT Re-Evaluation  06/23/18    PT Start Time  0800    PT Stop Time  0856    PT Time Calculation (min)  56 min    Activity Tolerance  Patient tolerated treatment well;Patient limited by pain    Behavior During Therapy  [facility] for tasks assessed/performed       Past Medical History:  Diagnosis Date  . Abdominal distention   . Acute encephalopathy   . Acute respiratory failure with hypoxia ([place])   . Acute systolic congestive heart failure ([place])   . Agitation   . 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 Benign essential HTN   . Bradycardia   . Cardiac device in situ   . Encounter for central line placement   . Encounter for imaging study to confirm orogastric (OG) tube placement   . Gastroesophageal reflux disease   . HCAP (healthcare-associated pneumonia)   . History of cardiac arrest   . Hypertension   . Leukocytosis   . Prediabetes   . ST elevation myocardial infarction (STEMI) ([employer])   . Streptococcal sore throat   . Sudden cardiac death [facility]) 07/03/17   Sudden cardiac death/Takatsubo  syndrome  . Takatsuki syndrome   . Ventricular fibrillation ([place]) 07/03/17    Past Surgical History:  Procedure Laterality Date  . ICD IMPLANT N/A 07/04/2017   SJM Fortify Assura VR ICD implanted by Dr Rayann Heman for secondary prevention after VF arrest  . LEFT HEART CATH AND CORONARY ANGIOGRAPHY N/A 2017-07-03   Procedure: LEFT HEART CATH AND CORONARY ANGIOGRAPHY;  Surgeon: Lorretta Harp, MD;  Location: [place] CV LAB;  Service: Cardiovascular;  Laterality: N/A;  . MENISCUS REPAIR  1983   . TOTAL KNEE REVISION Left 04/18/2018   Procedure: LEFT TOTAL KNEE REVISION;  Surgeon: Gaynelle Arabian, MD;  [facility];  Service: Orthopedics;  Laterality: Left;  Adductor Block    There were no vitals filed for this visit.  Subjective Assessment - 05/23/18 0803    Subjective  Pt reports doing good. "Went to doc yesterday and he said I just need to work on getting it straight. He said everything else looks fine."     Currently in Pain?  No/denies                       [facility] Adult PT Treatment/Exercise - 05/23/18 0001      Ambulation/Gait   Gait Comments  gait around the building, no device.      Knee/Hip Exercises: Aerobic   Tread Mill  fwd and backwd with it off him pushing to get more quad and better extension, needed some assist to push backward    Nustep  L3 x 7 min      Knee/Hip Exercises: Machines for Strengthening   Cybex Knee Extension  5# left only 2 x 10    Cybex Knee Flexion  25# with PT stretch at end range      Vasopneumatic   Number Minutes Vasopneumatic   15 minutes    Vasopnuematic Location   Knee  Vasopneumatic Pressure  Medium    Vasopneumatic Temperature   33      Manual Therapy   Manual Therapy  Joint mobilization    Passive ROM  Extension in seated with belt mob downward thrust with foot on belt               PT Short Term Goals - 05/02/18 1611      PT SHORT TERM GOAL #1   Title  independent with initial HEP    Status  Partially Met        PT Long Term Goals - 05/21/18 0932      PT LONG TERM GOAL #1   Title  decrease pain 50%    Status  Partially Met      PT LONG TERM GOAL #2   Title  incresae AROM to 10-110 degrees flexion    Status  Partially Met      PT LONG TERM GOAL #3   Title  walk without device and minimal deviation    Status  On-going      PT LONG TERM GOAL #4   Title  go up and down stairs step over step    Status  On-going      PT LONG TERM GOAL #5   Title  perform RICE at home    Status   Achieved            Plan - 05/23/18 0846    Clinical Impression Statement  Pt reports doctor is pleased with progress, and that we need to focus on extension. Pt requires tactile and verbal cueing to load the heel and put the foot down during ambulation. Pt is guarded during PROM and mobs but is tolerating better than previously. Pt reports being more active outside of therapy.     Rehab Potential  Good    PT Frequency  3x / week    PT Duration  4 weeks    PT Treatment/Interventions  ADLs/Self Care Home Management;Cryotherapy;Gait training;Therapeutic exercise;Therapeutic activities;Functional mobility training;Stair training;Patient/family education;Manual techniques;Vasopneumatic Device    PT Next Visit Plan  continue to push ROM as he tolerates work on gait, assess pt carry over from previous session in regard to extension and continue ROM     PT Home Exercise Plan  prone knee ext stretch; standing lunges for flex       Patient will benefit from skilled therapeutic intervention in order to improve the following deficits and impairments:  Abnormal gait, Decreased range of motion, Difficulty walking, Decreased activity tolerance, Pain, Impaired flexibility, Decreased scar mobility, Increased edema, Decreased strength, Decreased mobility  Visit Diagnosis: Acute pain of left knee  Stiffness of left knee, not elsewhere classified  Difficulty in walking, not elsewhere classified  Localized edema  Cognitive communication deficit  Muscle weakness (generalized)  Frontal lobe and executive function deficit  Unsteadiness on feet  Anoxic-ischemic encephalopathy [facility])     Problem List Patient Active Problem List   Diagnosis Date Noted  . 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 Hyperlipidemia 10/24/2017  . Benign essential HTN   . History of cardiac arrest   . Streptococcal sore throat   . 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 Cardiac device in situ    . Gastroesophageal reflux disease   . Acute systolic congestive heart failure ([place])   . Takatsuki syndrome   . Leukocytosis   . Prediabetes   . HCAP (healthcare-associated pneumonia)   . Agitation   . Acute encephalopathy   .  Bradycardia   . Essential hypertension   . Abdominal distention   . Encounter for imaging study to confirm orogastric (OG) tube placement   . Acute respiratory failure with hypoxia ([place])   . Encounter for central line placement   . Ventricular fibrillation ([place]) 07/20/2017  . Sudden cardiac death ([place]) Jul 20, 2017  . ST elevation myocardial infarction (STEMI) [facility])     Maryelizabeth Kaufmann, SPTA 05/23/2018, 8:51 AM  [place], [place], [address] Phone: [phone]   Fax:  [phone]  Name: Jeffery Thomas MRN: [id number] Date of Birth: Mar 29, 1951

## 2018-05-25 ENCOUNTER — Encounter: Admitting: Physical Therapy

## 2018-05-29 ENCOUNTER — Ambulatory Visit (INDEPENDENT_AMBULATORY_CARE_PROVIDER_SITE_OTHER): Payer: Medicare Other

## 2018-05-29 ENCOUNTER — Telehealth: Payer: Self-pay

## 2018-05-29 DIAGNOSIS — I428 Other cardiomyopathies: Secondary | ICD-10-CM

## 2018-05-29 DIAGNOSIS — Z95810 Presence of automatic (implantable) cardiac defibrillator: Secondary | ICD-10-CM

## 2018-05-29 NOTE — Progress Notes (Signed)
EPIC Encounter for ICM Monitoring  Patient Name: Jeffery Thomas is a 67 y.o. male Date: 05/29/2018 Primary Care Physican: Bernerd Limbo, MD Primary Cardiologist:Berry Electrophysiologist:Allred Dry Weight:Previous weight202lbs        Attempted call to patient and unable to reach.  Left message to return call.  Transmission reviewed.    Thoracic impedance returned to normal since last ICM remote transmission 05/21/2018.  Recommendations: NONE - Unable to reach.  Follow-up plan: ICM clinic phone appointment on 06/21/2018.      Copy of ICM check sent to Dr. Rayann Heman.   3 month ICM trend: 05/29/2018    1 Year ICM trend:       Rosalene Billings, RN 05/29/2018 12:03 PM

## 2018-05-29 NOTE — Telephone Encounter (Signed)
Remote ICM transmission received.  Attempted call to patient and left message to return call. 

## 2018-05-30 ENCOUNTER — Ambulatory Visit: Payer: Medicare Other | Admitting: Physical Therapy

## 2018-05-30 DIAGNOSIS — R600 Localized edema: Secondary | ICD-10-CM

## 2018-05-30 DIAGNOSIS — M25662 Stiffness of left knee, not elsewhere classified: Secondary | ICD-10-CM

## 2018-05-30 DIAGNOSIS — M25562 Pain in left knee: Secondary | ICD-10-CM

## 2018-05-30 DIAGNOSIS — R262 Difficulty in walking, not elsewhere classified: Secondary | ICD-10-CM

## 2018-05-30 NOTE — Therapy (Signed)
[place], [place], [address] Phone: [phone]   Fax:  [phone]  Physical Therapy Treatment  Patient Details  Name: Jeffery Thomas MRN: [id number] Date of Birth: [birth year] Referring Provider: Wynelle Link   Encounter Date: 05/30/2018  PT End of Session - 05/30/18 0935    Visit Number  14    Date for PT Re-Evaluation  06/23/18    PT Start Time  0931    PT Stop Time  1023    PT Time Calculation (min)  52 min    Activity Tolerance  Patient tolerated treatment well;Patient limited by pain    Behavior During Therapy  [facility] for tasks assessed/performed       Past Medical History:  Diagnosis Date  . Abdominal distention   . Acute encephalopathy   . Acute respiratory failure with hypoxia ([place])   . Acute systolic congestive heart failure ([place])   . Agitation   . Anoxic-ischemic encephalopathy ([place]) 07/05/2017  . Benign essential HTN   . Bradycardia   . Cardiac device in situ   . Encounter for central line placement   . Encounter for imaging study to confirm orogastric (OG) tube placement   . Gastroesophageal reflux disease   . HCAP (healthcare-associated pneumonia)   . History of cardiac arrest   . Hypertension   . Leukocytosis   . Prediabetes   . ST elevation myocardial infarction (STEMI) ([place])   . Streptococcal sore throat   . Sudden cardiac death [facility]) July 02, 2017   Sudden cardiac death/Takatsubo  syndrome  . Takatsuki syndrome   . Ventricular fibrillation ([place]) 2017/07/02    Past Surgical History:  Procedure Laterality Date  . ICD IMPLANT N/A 07/04/2017   SJM Fortify Assura VR ICD implanted by Dr Rayann Heman for secondary prevention after VF arrest  . LEFT HEART CATH AND CORONARY ANGIOGRAPHY N/A 02-Jul-2017   Procedure: LEFT HEART CATH AND CORONARY ANGIOGRAPHY;  Surgeon: Lorretta Harp, MD;  Location: [place] [facility];  Service: Cardiovascular;  Laterality: N/A;  . MENISCUS REPAIR  1983   . TOTAL KNEE REVISION Left 04/18/2018   Procedure: LEFT TOTAL KNEE REVISION;  Surgeon: Gaynelle Arabian, MD;  [facility];  Service: Orthopedics;  Laterality: Left;  Adductor Block    There were no vitals filed for this visit.  Subjective Assessment - 05/30/18 0933    Subjective  Pt. doing well today.  Most pain still after prolonged walking.      Patient Stated Goals  have less pain, better motion and walk better    Currently in Pain?  No/denies    Pain Score  0-No pain Pain up to 4-5/10 after prolonged walking     Pain Location  Knee    Pain Orientation  Left    Pain Descriptors / Indicators  Aching;Sore;Tightness    Pain Type  Acute pain    Multiple Pain Sites  No                       [facility] Adult PT Treatment/Exercise - 05/30/18 0957      Knee/Hip Exercises: Stretches   Quad Stretch  Left;1 rep;60 seconds    Quad Stretch Limitations  prone with strap     Gastroc Stretch  2 reps;Left;30 seconds    Gastroc Stretch Limitations  leaning into wall       Knee/Hip Exercises: Aerobic   Nustep  L3 x 7 min  Knee/Hip Exercises: Standing   Terminal Knee Extension  Left;10 reps;Theraband    Theraband Level (Terminal Knee Extension)  Level 4 (Blue)    Terminal Knee Extension Limitations  some excessive hip motion     Step Down  Left;10 reps;Step Height: 4";Hand Hold: 1    Step Down Limitations  limited eccentric control       Vasopneumatic   Number Minutes Vasopneumatic   10 minutes    Vasopnuematic Location   Knee    Vasopneumatic Pressure  Medium    Vasopneumatic Temperature   33      Manual Therapy   Manual Therapy  Muscle Energy Technique    Manual therapy comments  supine and prone     Joint Mobilization  L knee A/P mobs     Soft tissue mobilization  left quad strumming     Passive ROM  Manual L HS+GS stretch x 30 sec     Muscle Energy Technique  L HS contract/relax stretch with therapist in prone and supine x 3 rounds each position                 PT Short Term Goals - 05/02/18 1611      PT SHORT TERM GOAL #1   Title  independent with initial HEP    Status  Partially Met        PT Long Term Goals - 05/21/18 0932      PT LONG TERM GOAL #1   Title  decrease pain 50%    Status  Partially Met      PT LONG TERM GOAL #2   Title  incresae AROM to 10-110 degrees flexion    Status  Partially Met      PT LONG TERM GOAL #3   Title  walk without device and minimal deviation    Status  On-going      PT LONG TERM GOAL #4   Title  go up and down stairs step over step    Status  On-going      PT LONG TERM GOAL #5   Title  perform RICE at home    Status  Achieved            Plan - 05/30/18 0939    Clinical Impression Statement  Pt. noting MD considering ordering L knee extension brace for him pending progress with therapy.  Jeffery Thomas tolerated all extension ROM and quad strengthening activities in session well today with addition of contract relax HS/quad stretches for hopeful reduction in muscular guarding and improvement in ROM.  Much difficulty with 4" eccentric step-down today requiring frequent cueing to upright posture and control of movement.  Pt. noting he is performing heel prop stretch for extension at home with assistance from wife.  Ended session with ice/compression per pt. request to reduce post-exercise swelling and pain.  Will continue to progress toward goals.      PT Treatment/Interventions  ADLs/Self Care Home Management;Cryotherapy;Gait training;Therapeutic exercise;Therapeutic activities;Functional mobility training;Stair training;Patient/family education;Manual techniques;Vasopneumatic Device    PT Home Exercise Plan  prone knee ext stretch; standing lunges for flex    Consulted and Agree with Plan of Care  Patient       Patient will benefit from skilled therapeutic intervention in order to improve the following deficits and impairments:  Abnormal gait, Decreased range of motion, Difficulty  walking, Decreased activity tolerance, Pain, Impaired flexibility, Decreased scar mobility, Increased edema, Decreased strength, Decreased mobility  Visit Diagnosis: Acute pain of left knee  Stiffness of left knee, not elsewhere classified  Difficulty in walking, not elsewhere classified  Localized edema     Problem List Patient Active Problem List   Diagnosis Date Noted  . 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 Hyperlipidemia 10/24/2017  . Benign essential HTN   . History of cardiac arrest   . Streptococcal sore throat   . Anoxic-ischemic encephalopathy ([place]) 07/05/2017  . Cardiac device in situ   . Gastroesophageal reflux disease   . Acute systolic congestive heart failure ([place])   . Takatsuki syndrome   . Leukocytosis   . Prediabetes   . HCAP (healthcare-associated pneumonia)   . Agitation   . Acute encephalopathy   . Bradycardia   . Essential hypertension   . Abdominal distention   . Encounter for imaging study to confirm orogastric (OG) tube placement   . Acute respiratory failure with hypoxia ([place])   . Encounter for central line placement   . Ventricular fibrillation ([place]) 2017-07-13  . Sudden cardiac death ([place]) 07/13/17  . ST elevation myocardial infarction (STEMI) [facility])     Bess Harvest, PTA 05/30/18 10:30 AM   [place], [place], [address] Phone: [phone]   Fax:  [phone]  Name: EWARD RUTIGLIANO MRN: [id number] Date of Birth: 11-30-1950

## 2018-05-31 ENCOUNTER — Ambulatory Visit: Payer: Medicare Other | Admitting: Physical Therapy

## 2018-05-31 DIAGNOSIS — M25662 Stiffness of left knee, not elsewhere classified: Secondary | ICD-10-CM

## 2018-05-31 DIAGNOSIS — R600 Localized edema: Secondary | ICD-10-CM

## 2018-05-31 DIAGNOSIS — M25562 Pain in left knee: Secondary | ICD-10-CM | POA: Diagnosis not present

## 2018-05-31 NOTE — Therapy (Signed)
[place], [place], [address] Phone: [phone]   Fax:  [phone]  Physical Therapy Treatment  Patient Details  Name: Jeffery Thomas MRN: [id number] Date of Birth: December 08, 1950 Referring Provider: Wynelle Link   Encounter Date: 05/31/2018  PT End of Session - 05/31/18 0932    Visit Number  15    Date for PT Re-Evaluation  06/23/18    PT Start Time  0932    PT Stop Time  1032    PT Time Calculation (min)  60 min    Activity Tolerance  Patient tolerated treatment well    Behavior During Therapy  [facility] for tasks assessed/performed       Past Medical History:  Diagnosis Date  . Abdominal distention   . Acute encephalopathy   . Acute respiratory failure with hypoxia ([place])   . Acute systolic congestive heart failure ([place])   . Agitation   . 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 Benign essential HTN   . Bradycardia   . Cardiac device in situ   . Encounter for central line placement   . Encounter for imaging study to confirm orogastric (OG) tube placement   . Gastroesophageal reflux disease   . HCAP (healthcare-associated pneumonia)   . History of cardiac arrest   . Hypertension   . Leukocytosis   . Prediabetes   . ST elevation myocardial infarction (STEMI) ([place])   . Streptococcal sore throat   . Sudden cardiac death [facility]) 07/22/17   Sudden cardiac death/Takatsubo  syndrome  . Takatsuki syndrome   . Ventricular fibrillation ([place]) 07-22-2017    Past Surgical History:  Procedure Laterality Date  . ICD IMPLANT N/A 07/04/2017   SJM Fortify Assura VR ICD implanted by Dr Rayann Heman for secondary prevention after VF arrest  . LEFT HEART CATH AND CORONARY ANGIOGRAPHY N/A 07-22-2017   Procedure: LEFT HEART CATH AND CORONARY ANGIOGRAPHY;  Surgeon: Lorretta Harp, MD;  Location: [place] [facility];  Service: Cardiovascular;  Laterality: N/A;  . MENISCUS REPAIR  1983  . TOTAL KNEE REVISION  Left 04/18/2018   Procedure: LEFT TOTAL KNEE REVISION;  Surgeon: Gaynelle Arabian, MD;  [facility];  Service: Orthopedics;  Laterality: Left;  Adductor Block    There were no vitals filed for this visit.  Subjective Assessment - 05/31/18 0934    Subjective  no new complaints    Currently in Pain?  No/denies         [facility] PT Assessment - 05/31/18 0001      AROM   Left Knee Extension  -14      PROM   Left Knee Extension  -12                   [facility] Adult PT Treatment/Exercise - 05/31/18 0001      Knee/Hip Exercises: Aerobic   Tread Mill  fwd/bwd x 20 each no assist    Nustep  L4 x 7 min      Knee/Hip Exercises: Machines for Strengthening   Cybex Knee Extension  5# left only 2 x 10 R assist at end range ext; left only down    Cybex Knee Flexion  25# with PT stretch at end range 2 x 10      Knee/Hip Exercises: Standing   Terminal Knee Extension  Left;10 reps;Theraband;2 sets 10 sec hod    Theraband Level (Terminal Knee Extension)  Level 4 (Blue)    Terminal Knee  Extension Limitations  cues for hip stabilization      Modalities   Modalities  Vasopneumatic      Vasopneumatic   Number Minutes Vasopneumatic   15 minutes    Vasopnuematic Location   Knee    Vasopneumatic Pressure  Medium    Vasopneumatic Temperature   34      Manual Therapy   Manual therapy comments  Prone    Soft tissue mobilization  IASTM to L HS and gastroc and post knee    Passive ROM  L HS stretch    Muscle Energy Technique  contract/relax L HS                PT Short Term Goals - 05/02/18 1611      PT SHORT TERM GOAL #1   Title  independent with initial HEP    Status  Partially Met        PT Long Term Goals - 05/21/18 0932      PT LONG TERM GOAL #1   Title  decrease pain 50%    Status  Partially Met      PT LONG TERM GOAL #2   Title  incresae AROM to 10-110 degrees flexion    Status  Partially Met      PT LONG TERM GOAL #3   Title  walk without device and  minimal deviation    Status  On-going      PT LONG TERM GOAL #4   Title  go up and down stairs step over step    Status  On-going      PT LONG TERM GOAL #5   Title  perform RICE at home    Status  Achieved            Plan - 05/31/18 1213    Clinical Impression Statement  Patient tolerated TE fairly well today. Greatest pain was with eccentric lowering from knee extension. Good response to prone contract/relax. Ext slowly improving.       Patient will benefit from skilled therapeutic intervention in order to improve the following deficits and impairments:     Visit Diagnosis: Acute pain of left knee  Stiffness of left knee, not elsewhere classified  Localized edema     Problem List Patient Active Problem List   Diagnosis Date Noted  . 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 Hyperlipidemia 10/24/2017  . Benign essential HTN   . History of cardiac arrest   . Streptococcal sore throat   . 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 Cardiac device in situ   . Gastroesophageal reflux disease   . Acute systolic congestive heart failure ([place])   . Takatsuki syndrome   . Leukocytosis   . Prediabetes   . HCAP (healthcare-associated pneumonia)   . Agitation   . Acute encephalopathy   . Bradycardia   . Essential hypertension   . Abdominal distention   . Encounter for imaging study to confirm orogastric (OG) tube placement   . Acute respiratory failure with hypoxia ([place])   . Encounter for central line placement   . Ventricular fibrillation ([place]) 2017-07-17  . Sudden cardiac death ([place]) July 17, 2017  . ST elevation myocardial infarction (STEMI) ([place])     Kiaja Shorty PT 05/31/2018, 12:16 PM  [place], [place], [address] Phone: [phone]   Fax:  [phone]  Name: Jeffery Thomas MRN: [id number] Date of Birth: [birth year]

## 2018-06-04 ENCOUNTER — Ambulatory Visit: Payer: Medicare Other | Admitting: Physical Therapy

## 2018-06-04 DIAGNOSIS — G931 Anoxic brain damage, not elsewhere classified: Secondary | ICD-10-CM

## 2018-06-04 DIAGNOSIS — M25662 Stiffness of left knee, not elsewhere classified: Secondary | ICD-10-CM

## 2018-06-04 DIAGNOSIS — M25562 Pain in left knee: Secondary | ICD-10-CM

## 2018-06-04 DIAGNOSIS — M6281 Muscle weakness (generalized): Secondary | ICD-10-CM

## 2018-06-04 DIAGNOSIS — R600 Localized edema: Secondary | ICD-10-CM

## 2018-06-04 DIAGNOSIS — R2681 Unsteadiness on feet: Secondary | ICD-10-CM

## 2018-06-04 DIAGNOSIS — R262 Difficulty in walking, not elsewhere classified: Secondary | ICD-10-CM

## 2018-06-04 DIAGNOSIS — I6782 Cerebral ischemia: Secondary | ICD-10-CM

## 2018-06-04 DIAGNOSIS — R41844 Frontal lobe and executive function deficit: Secondary | ICD-10-CM

## 2018-06-04 DIAGNOSIS — R41841 Cognitive communication deficit: Secondary | ICD-10-CM

## 2018-06-04 NOTE — Therapy (Addendum)
[place], [place], [address] Phone: [phone]   Fax:  [phone]  Physical Therapy Treatment  Patient Details  Name: Jeffery Thomas MRN: [id number] Date of Birth: 1950/11/09 Referring Provider: Wynelle Link   Encounter Date: 06/04/2018  PT End of Session - 06/04/18 1015    Visit Number  16    Date for PT Re-Evaluation  06/23/18    PT Start Time  0930    PT Stop Time  1028    PT Time Calculation (min)  58 min    Activity Tolerance  Patient tolerated treatment well    Behavior During Therapy  [facility] for tasks assessed/performed       Past Medical History:  Diagnosis Date  . Abdominal distention   . Acute encephalopathy   . Acute respiratory failure with hypoxia ([place])   . Acute systolic congestive heart failure ([place])   . Agitation   . 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 Benign essential HTN   . Bradycardia   . Cardiac device in situ   . Encounter for central line placement   . Encounter for imaging study to confirm orogastric (OG) tube placement   . Gastroesophageal reflux disease   . HCAP (healthcare-associated pneumonia)   . History of cardiac arrest   . Hypertension   . Leukocytosis   . Prediabetes   . ST elevation myocardial infarction (STEMI) ([place])   . Streptococcal sore throat   . Sudden cardiac death [facility]) July 10, 2017   Sudden cardiac death/Takatsubo  syndrome  . Takatsuki syndrome   . Ventricular fibrillation ([place]) 07/10/17    Past Surgical History:  Procedure Laterality Date  . ICD IMPLANT N/A 07/04/2017   SJM Fortify Assura VR ICD implanted by Dr Rayann Heman for secondary prevention after VF arrest  . LEFT HEART CATH AND CORONARY ANGIOGRAPHY N/A 07/10/17   Procedure: LEFT HEART CATH AND CORONARY ANGIOGRAPHY;  Surgeon: Lorretta Harp, MD;  Location: [place] [facility];  Service: Cardiovascular;  Laterality: N/A;  . MENISCUS REPAIR  1983  . TOTAL KNEE REVISION  Left 04/18/2018   Procedure: LEFT TOTAL KNEE REVISION;  Surgeon: Gaynelle Arabian, MD;  [facility];  Service: Orthopedics;  Laterality: Left;  Adductor Block    There were no vitals filed for this visit.  Subjective Assessment - 06/04/18 0933    Subjective  Pt reports having had an easy weekend and feeling good with no pain.    Currently in Pain?  No/denies                       [facility] Adult PT Treatment/Exercise - 06/04/18 0001      Knee/Hip Exercises: Aerobic   Nustep  L4 x 7 min      Knee/Hip Exercises: Machines for Strengthening   Cybex Knee Extension  5# left only 2 x 10    Cybex Knee Flexion  25# with PT stretch at end range 2 x 10      Knee/Hip Exercises: Standing   Terminal Knee Extension  Left;10 reps;Theraband;2 sets    Theraband Level (Terminal Knee Extension)  Level 4 (Blue)    Terminal Knee Extension Limitations  cues for hip stabilization      Knee/Hip Exercises: Supine   Straight Leg Raises  2 sets;10 reps;Left cues for breathing      Modalities   Modalities  Vasopneumatic      Vasopneumatic   Number Minutes Vasopneumatic  15 minutes    Vasopnuematic Location   Knee    Vasopneumatic Pressure  Medium    Vasopneumatic Temperature   34      Manual Therapy   Manual Therapy  Joint mobilization    Manual therapy comments  seated with leg propped on knee and foot on belt    Joint Mobilization  extension    Soft tissue mobilization  hamstring and calf                PT Short Term Goals - 05/02/18 1611      PT SHORT TERM GOAL #1   Title  independent with initial HEP    Status  Partially Met        PT Long Term Goals - 05/21/18 0932      PT LONG TERM GOAL #1   Title  decrease pain 50%    Status  Partially Met      PT LONG TERM GOAL #2   Title  incresae AROM to 10-110 degrees flexion    Status  Partially Met      PT LONG TERM GOAL #3   Title  walk without device and minimal deviation    Status  On-going      PT LONG  TERM GOAL #4   Title  go up and down stairs step over step    Status  On-going      PT LONG TERM GOAL #5   Title  perform RICE at home    Status  Achieved            Plan - 06/04/18 1016    Clinical Impression Statement  Pt tolerated treatment well. Pt continues to have pain with eccentric lowering from knee extension machine. Pt remains very guarded during most activities. Pt reported discussing casting with doctor to improve extension of LLE.     PT Frequency  3x / week    PT Duration  4 weeks    PT Treatment/Interventions  ADLs/Self Care Home Management;Cryotherapy;Gait training;Therapeutic exercise;Therapeutic activities;Functional mobility training;Stair training;Patient/family education;Manual techniques;Vasopneumatic Device    PT Next Visit Plan  continue to push ROM as he tolerates work on gait, assess pt carry over from previous session in regard to extension and continue ROM     PT Home Exercise Plan  prone knee ext stretch; standing lunges for flex       Patient will benefit from skilled therapeutic intervention in order to improve the following deficits and impairments:  Abnormal gait, Decreased range of motion, Difficulty walking, Decreased activity tolerance, Pain, Impaired flexibility, Decreased scar mobility, Increased edema, Decreased strength, Decreased mobility  Visit Diagnosis: Acute pain of left knee  Stiffness of left knee, not elsewhere classified  Localized edema  Difficulty in walking, not elsewhere classified  Cognitive communication deficit  Muscle weakness (generalized)  Frontal lobe and executive function deficit  Unsteadiness on feet  Anoxic-ischemic encephalopathy [facility])     Problem List Patient Active Problem List   Diagnosis Date Noted  . 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 Hyperlipidemia 10/24/2017  . Benign essential HTN   . History of cardiac arrest   . Streptococcal sore throat   . Anoxic-ischemic  encephalopathy ([place]) 07/05/2017  . Cardiac device in situ   . Gastroesophageal reflux disease   . Acute systolic congestive heart failure ([place])   . Takatsuki syndrome   . Leukocytosis   . Prediabetes   . HCAP (healthcare-associated pneumonia)   . Agitation   .  Acute encephalopathy   . Bradycardia   . Essential hypertension   . Abdominal distention   . Encounter for imaging study to confirm orogastric (OG) tube placement   . Acute respiratory failure with hypoxia ([place])   . Encounter for central line placement   . Ventricular fibrillation ([place]) June 30, 2017  . Sudden cardiac death ([place]) 06-30-2017  . ST elevation myocardial infarction (STEMI) [facility])     Maryelizabeth Kaufmann, SPTA 06/04/2018, 10:23 AM  [place], [place], [address] Phone: [phone]   Fax:  [phone]  Name: Jeffery Thomas MRN: [id number] Date of Birth: [birth year]  PHYSICAL THERAPY DISCHARGE SUMMARY  Visits from Start of Care:16  Current functional level related to goals / functional outcomes: See above   Remaining deficits: See above   Education / Equipment: HEP Plan: Patient agrees to discharge.  Patient goals were partially met. Patient is being discharged due to not returning since the last visit.  ?????    Madelyn Flavors, PT 12/18/18 1:32 PM;  [place], [place], [address] Phone: [phone]   Fax:  [phone]

## 2018-06-06 ENCOUNTER — Encounter: Payer: Medicare Other | Admitting: Physical Therapy

## 2018-06-08 ENCOUNTER — Encounter: Payer: Medicare Other | Admitting: Physical Therapy

## 2018-06-11 ENCOUNTER — Ambulatory Visit: Payer: Medicare Other | Admitting: Physical Therapy

## 2018-06-13 ENCOUNTER — Encounter: Payer: Medicare Other | Admitting: Physical Therapy

## 2018-06-15 ENCOUNTER — Encounter: Payer: Medicare Other | Admitting: Physical Therapy

## 2018-06-21 ENCOUNTER — Ambulatory Visit (INDEPENDENT_AMBULATORY_CARE_PROVIDER_SITE_OTHER): Payer: Medicare Other

## 2018-06-21 DIAGNOSIS — Z95810 Presence of automatic (implantable) cardiac defibrillator: Secondary | ICD-10-CM | POA: Diagnosis not present

## 2018-06-21 DIAGNOSIS — I428 Other cardiomyopathies: Secondary | ICD-10-CM

## 2018-06-22 NOTE — Progress Notes (Signed)
EPIC Encounter for ICM Monitoring  Patient Name: Jeffery Thomas is a 67 y.o. male Date: 06/22/2018 Primary Care Physican: Bernerd Limbo, MD Primary Cardiologist:Berry Electrophysiologist:Allred Dry Weight:Previous weight202lbs                                                               Attempted call to patient and unable to reach.  Left message to return call.  Transmission reviewed.    Thoracic impedance normal but was abnormal from 8/5 through 8/14 suggesting fluid accumulation.  Recommendations: NONE - Unable to reach.  Follow-up plan: ICM clinic phone appointment on 07/23/2018.      Copy of ICM check sent to Dr. Rayann Heman.   3 month ICM trend: 06/21/2018    1 Year ICM trend:       Rosalene Billings, RN 06/22/2018 9:38 AM

## 2018-06-25 ENCOUNTER — Other Ambulatory Visit: Payer: Self-pay | Admitting: Cardiovascular Disease

## 2018-06-25 DIAGNOSIS — E785 Hyperlipidemia, unspecified: Secondary | ICD-10-CM

## 2018-07-10 ENCOUNTER — Telehealth: Payer: Self-pay | Admitting: Cardiology

## 2018-07-10 ENCOUNTER — Ambulatory Visit: Payer: Medicare Other | Admitting: *Deleted

## 2018-07-10 NOTE — Progress Notes (Signed)
No transmission  

## 2018-07-10 NOTE — Telephone Encounter (Signed)
Spoke with pt and reminded pt of remote transmission that is due today. Pt verbalized understanding.   

## 2018-07-13 ENCOUNTER — Encounter: Payer: Self-pay | Admitting: Cardiology

## 2018-07-17 ENCOUNTER — Ambulatory Visit (INDEPENDENT_AMBULATORY_CARE_PROVIDER_SITE_OTHER): Payer: Medicare Other | Admitting: Family Medicine

## 2018-07-17 ENCOUNTER — Encounter: Payer: Self-pay | Admitting: Family Medicine

## 2018-07-17 VITALS — BP 130/82 | HR 50 | Temp 98.0°F | Ht 71.0 in | Wt 196.2 lb

## 2018-07-17 DIAGNOSIS — Z8674 Personal history of sudden cardiac arrest: Secondary | ICD-10-CM

## 2018-07-17 DIAGNOSIS — I469 Cardiac arrest, cause unspecified: Secondary | ICD-10-CM

## 2018-07-17 DIAGNOSIS — Z0001 Encounter for general adult medical examination with abnormal findings: Secondary | ICD-10-CM

## 2018-07-17 DIAGNOSIS — Z23 Encounter for immunization: Secondary | ICD-10-CM

## 2018-07-17 DIAGNOSIS — Z125 Encounter for screening for malignant neoplasm of prostate: Secondary | ICD-10-CM | POA: Diagnosis not present

## 2018-07-17 DIAGNOSIS — D485 Neoplasm of uncertain behavior of skin: Secondary | ICD-10-CM | POA: Diagnosis not present

## 2018-07-17 DIAGNOSIS — E785 Hyperlipidemia, unspecified: Secondary | ICD-10-CM | POA: Diagnosis not present

## 2018-07-17 DIAGNOSIS — Z0000 Encounter for general adult medical examination without abnormal findings: Secondary | ICD-10-CM | POA: Insufficient documentation

## 2018-07-17 LAB — COMPREHENSIVE METABOLIC PANEL
ALBUMIN: 4.3 g/dL (ref 3.5–5.2)
ALT: 12 U/L (ref 0–53)
AST: 9 U/L (ref 0–37)
Alkaline Phosphatase: 78 U/L (ref 39–117)
BILIRUBIN TOTAL: 0.5 mg/dL (ref 0.2–1.2)
BUN: 19 mg/dL (ref 6–23)
CALCIUM: 9.3 mg/dL (ref 8.4–10.5)
CO2: 28 mEq/L (ref 19–32)
CREATININE: 1 mg/dL (ref 0.40–1.50)
Chloride: 104 mEq/L (ref 96–112)
GFR: 79.67 mL/min (ref >60.00)
Glucose, Bld: 104 mg/dL — ABNORMAL HIGH (ref 70–99)
Potassium: 4.4 mEq/L (ref 3.5–5.1)
SODIUM: 140 meq/L (ref 135–145)
Total Protein: 6.9 g/dL (ref 6.0–8.3)

## 2018-07-17 LAB — CBC
HEMATOCRIT: 42.9 % (ref 39.0–52.0)
Hemoglobin: 13.8 g/dL (ref 13.0–17.0)
MCHC: 32.1 g/dL (ref 30.0–36.0)
MCV: 79 fl (ref 78.0–100.0)
Platelets: 261 10*3/uL (ref 150.0–400.0)
RBC: 5.43 Mil/uL (ref 4.22–5.81)
RDW: 16.2 % — ABNORMAL HIGH (ref 11.5–15.5)
WBC: 7.5 10*3/uL (ref 4.0–10.5)

## 2018-07-17 LAB — LIPID PANEL
CHOLESTEROL: 118 mg/dL (ref 0–200)
HDL: 37.5 mg/dL — ABNORMAL LOW (ref >39.00)
LDL Cholesterol: 65 mg/dL (ref 0–99)
NONHDL: 80.12
Total CHOL/HDL Ratio: 3
Triglycerides: 77 mg/dL (ref 0.0–149.0)
VLDL: 15.4 mg/dL (ref 0.0–40.0)

## 2018-07-17 LAB — PSA: PSA: 0.77 ng/mL (ref 0.10–4.00)

## 2018-07-17 LAB — TSH: TSH: 2.4 u[IU]/mL (ref 0.35–4.50)

## 2018-07-17 NOTE — Patient Instructions (Signed)

## 2018-07-17 NOTE — Progress Notes (Signed)
Jeffery Thomas - 67 y.o. male MRN [id number]  Date of birth: 24-Jan-1951  Subjective Chief Complaint  Patient presents with  . Annual Exam    HPI Jeffery Thomas is a 66 y.o. 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  He is also followed by cardiology and orthopedics.  Had knee replacement earlier this year but is having some problems with stiffness since operation.  From a cardiac standpoint he has an ICD and denies any palpitations, anginal or CHF symptoms.  He remains fairly active and tries to follow a healthy diet.  He is up to date on colon cancer screening.  He has never had pneumonia vaccine and doesn't take flu vaccines.  He did quit smoking last year.    He does have concern about skin lesion on chest.  Has been there for several years.  Would scab over and heal previously but now has become larger and will not heal.   Review of Systems  Constitutional: Negative for chills, fever, malaise/fatigue and weight loss.  HENT: Negative for congestion, ear pain and sore throat.   Eyes: Negative for blurred vision, double vision and pain.  Respiratory: Negative for cough and shortness of breath.   Cardiovascular: Negative for chest pain and palpitations.  Gastrointestinal: Negative for abdominal pain, blood in stool, constipation, heartburn and nausea.  Genitourinary: Negative for dysuria and urgency.  Musculoskeletal: Negative for joint pain and myalgias.  Skin:       Skin lesion   Neurological: Negative for dizziness and headaches.  Endo/Heme/Allergies: Does not bruise/bleed easily.  Psychiatric/Behavioral: Negative for depression. The patient is not nervous/anxious and does not have insomnia.       No Known Allergies  Past Medical History:  Diagnosis Date  . Abdominal distention   . Acute encephalopathy   . Acute respiratory failure with hypoxia ([place])   . Acute systolic  congestive heart failure ([place])   . Agitation   . 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 Benign essential HTN   . Bradycardia   . Cardiac device in situ   . Encounter for central line placement   . Encounter for imaging study to confirm orogastric (OG) tube placement   . Gastroesophageal reflux disease   . HCAP (healthcare-associated pneumonia)   . History of cardiac arrest   . Hypertension   . Leukocytosis   . Prediabetes   . ST elevation myocardial infarction (STEMI) ([place])   . Streptococcal sore throat   . Sudden cardiac death [facility]) 07/04/17   Sudden cardiac death/Takatsubo  syndrome  . Takatsuki syndrome   . 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 ICD IMPLANT N/A 07/04/2017   SJM Fortify Assura VR ICD implanted by Dr Rayann Heman for secondary prevention after VF arrest  . LEFT HEART CATH AND CORONARY ANGIOGRAPHY N/A 07-04-17   Procedure: LEFT HEART CATH AND CORONARY ANGIOGRAPHY;  Surgeon: Lorretta Harp, MD;  Location: [place] [facility];  Service: Cardiovascular;  Laterality: N/A;  . MENISCUS REPAIR  1983  . TOTAL KNEE REVISION Left 04/18/2018   Procedure: LEFT TOTAL KNEE REVISION;  Surgeon: Gaynelle Arabian, MD;  Location: [facility];  Service: Orthopedics;  Laterality: Left;  Adductor Block    Social History   Socioeconomic History  . Marital status: Married    Spouse name: Not on file  . Number of children:  Not on file  . Years of education: Not on file  . Highest education level: Not on file  Occupational History  . Not on file  Social Needs  . Financial resource strain: Not on file  . Food insecurity:    Worry: Not on file    Inability: Not on file  . Transportation needs:    Medical: Not on file    Non-medical: Not on file  Tobacco Use  . Smoking status: Former Smoker    Types: Cigars    Last attempt to quit: 06/26/2017    Years since quitting: 1.0  . Smokeless tobacco: Never Used  Substance  and Sexual Activity  . Alcohol use: Not Currently  . Drug use: Never  . Sexual activity: Not on file  Lifestyle  . Physical activity:    Days per week: Not on file    Minutes per session: Not on file  . Stress: Not on file  Relationships  . Social connections:    Talks on phone: Not on file    Gets together: Not on file    Attends religious service: Not on file    Active member of club or organization: Not on file    Attends meetings of clubs or organizations: Not on file    Relationship status: Not on file  Other Topics Concern  . Not on file  Social History Narrative  . Not on file    Family History  Problem Relation Age of Onset  . Hypertension Mother   . Diabetes Father   . Cancer Brother        pancreatic    Health Maintenance  Topic Date Due  . Hepatitis C Screening  05/04/1951  . COLONOSCOPY  05/22/2003  . PNA vac Low Risk Adult (1 of 2 - PCV13) 05/21/2018  . INFLUENZA VACCINE  03/06/2019 (Originally 06/07/2018)  . TETANUS/TDAP  07/18/2019 (Originally 05/21/1972)  . HIV Screening  Completed    ----------------------------------------------------------------------------------------------------------------------------------------------------------------------------------------------------------------- Physical Exam BP 130/82 (BP Location: Left Arm, Patient Position: Sitting, Cuff Size: Normal)   Pulse (!) 50   Temp 98 F (36.7 C) (Oral)   Ht '5\' 11"'  (1.803 m)   Wt 196 lb 3.2 oz (89 kg)   SpO2 95%   BMI 27.36 kg/m   Physical Exam  Constitutional: He is oriented to person, place, and time. He appears well-nourished. No distress.  HENT:  Head: Normocephalic and atraumatic.  Right Ear: External ear normal.  Left Ear: External ear normal.  Mouth/Throat: Oropharynx is clear and moist.  Eyes: No scleral icterus.  Neck: Normal range of motion. No thyromegaly present.  Cardiovascular: Normal rate, regular rhythm, normal heart sounds and intact distal pulses.    ICD visible L upper chest wall.   Pulmonary/Chest: Effort normal and breath sounds normal.  Abdominal: Soft. Bowel sounds are normal. He exhibits no distension. There is no tenderness. There is no guarding.  Musculoskeletal: He exhibits no edema.  Lymphadenopathy:    He has no cervical adenopathy.  Neurological: He is alert and oriented to person, place, and time. No cranial nerve deficit. He exhibits normal muscle tone.  Skin: Skin is warm and dry. No rash noted.  ~3cm oblong lesion to R upper chest wall.  Ulcerated central appearance with rolled/pearly borders.    Psychiatric: He has a normal mood and affect. His behavior is normal.    ------------------------------------------------------------------------------------------------------------------------------------------------------------------------------------------------------------------- Assessment and Plan  Neoplasm of uncertain behavior of skin Area with appearance of [facility] Referral placed to dermatology.  Well adult exam Well adult Orders Placed This Encounter  Procedures  . Pneumococcal conjugate vaccine 13-valent  . Comp Met (CMET)  . TSH  . Lipid Profile  . CBC  . PSA  . Ambulatory referral to Dermatology    Referral Priority:   Routine    Referral Type:   Consultation    Referral Reason:   Specialty Services Required    Requested Specialty:   Dermatology    Number of Visits Requested:   1  Immunizations:  Prevnar 13, declines flu vaccine Screenings:  PSA.  Up to date on colon cancer screening.  Referral to derm for skin lesion Anticipatory guidance/Risk factor reduction:  Per AVS  Sudden cardiac death [facility]) Follows with cardiology S/p ICD Stable at this time.

## 2018-07-17 NOTE — Assessment & Plan Note (Signed)
Follows with cardiology S/p ICD Stable at this time.

## 2018-07-17 NOTE — Assessment & Plan Note (Signed)
Area with appearance of [facility].  Referral placed to dermatology.   

## 2018-07-17 NOTE — Assessment & Plan Note (Signed)
Well adult Orders Placed This Encounter  Procedures  . Pneumococcal conjugate vaccine 13-valent  . Comp Met (CMET)  . TSH  . Lipid Profile  . CBC  . PSA  . Ambulatory referral to Dermatology    Referral Priority:   Routine    Referral Type:   Consultation    Referral Reason:   Specialty Services Required    Requested Specialty:   Dermatology    Number of Visits Requested:   1  Immunizations:  Prevnar 13, declines flu vaccine Screenings:  PSA.  Up to date on colon cancer screening.  Referral to derm for skin lesion Anticipatory guidance/Risk factor reduction:  Per AVS

## 2018-07-20 ENCOUNTER — Inpatient Hospital Stay (HOSPITAL_COMMUNITY)
Admission: AD | Admit: 2018-07-20 | Discharge: 2018-07-22 | DRG: 245 | Disposition: A | Discharge status: Home / Self Care | Payer: Medicare Other | Source: Ambulatory Visit | Attending: Internal Medicine | Admitting: Internal Medicine

## 2018-07-20 ENCOUNTER — Encounter: Payer: Self-pay | Admitting: Internal Medicine

## 2018-07-20 ENCOUNTER — Telehealth: Payer: Self-pay | Admitting: Cardiology

## 2018-07-20 DIAGNOSIS — R402360 Coma scale, best motor response, obeys commands, unspecified time: Secondary | ICD-10-CM | POA: Diagnosis present

## 2018-07-20 DIAGNOSIS — Z792 Long term (current) use of antibiotics: Secondary | ICD-10-CM | POA: Diagnosis not present

## 2018-07-20 DIAGNOSIS — T82111A Breakdown (mechanical) of cardiac pulse generator (battery), initial encounter: Principal | ICD-10-CM | POA: Diagnosis present

## 2018-07-20 DIAGNOSIS — Z8639 Personal history of other endocrine, nutritional and metabolic disease: Secondary | ICD-10-CM

## 2018-07-20 DIAGNOSIS — R000 Tachycardia, unspecified: Secondary | ICD-10-CM | POA: Diagnosis present

## 2018-07-20 DIAGNOSIS — Z4502 Encounter for adjustment and management of automatic implantable cardiac defibrillator: Secondary | ICD-10-CM | POA: Diagnosis not present

## 2018-07-20 DIAGNOSIS — R402250 Coma scale, best verbal response, oriented, unspecified time: Secondary | ICD-10-CM | POA: Diagnosis present

## 2018-07-20 DIAGNOSIS — Z95810 Presence of automatic (implantable) cardiac defibrillator: Secondary | ICD-10-CM

## 2018-07-20 DIAGNOSIS — Z8674 Personal history of sudden cardiac arrest: Secondary | ICD-10-CM

## 2018-07-20 DIAGNOSIS — R402140 Coma scale, eyes open, spontaneous, unspecified time: Secondary | ICD-10-CM | POA: Diagnosis present

## 2018-07-20 DIAGNOSIS — Z87891 Personal history of nicotine dependence: Secondary | ICD-10-CM | POA: Diagnosis not present

## 2018-07-20 DIAGNOSIS — E785 Hyperlipidemia, unspecified: Secondary | ICD-10-CM | POA: Diagnosis present

## 2018-07-20 DIAGNOSIS — I252 Old myocardial infarction: Secondary | ICD-10-CM

## 2018-07-20 DIAGNOSIS — K219 Gastro-esophageal reflux disease without esophagitis: Secondary | ICD-10-CM | POA: Diagnosis present

## 2018-07-20 DIAGNOSIS — Z79899 Other long term (current) drug therapy: Secondary | ICD-10-CM

## 2018-07-20 DIAGNOSIS — Y711 Therapeutic (nonsurgical) and rehabilitative cardiovascular devices associated with adverse incidents: Secondary | ICD-10-CM | POA: Diagnosis present

## 2018-07-20 DIAGNOSIS — I4901 Ventricular fibrillation: Secondary | ICD-10-CM | POA: Diagnosis not present

## 2018-07-20 DIAGNOSIS — Z87820 Personal history of traumatic brain injury: Secondary | ICD-10-CM | POA: Diagnosis not present

## 2018-07-20 DIAGNOSIS — Z8679 Personal history of other diseases of the circulatory system: Secondary | ICD-10-CM

## 2018-07-20 DIAGNOSIS — I10 Essential (primary) hypertension: Secondary | ICD-10-CM | POA: Diagnosis present

## 2018-07-20 LAB — COMPREHENSIVE METABOLIC PANEL
ALBUMIN: 3.8 g/dL (ref 3.5–5.0)
ALT: 16 U/L (ref 0–44)
ANION GAP: 9 (ref 5–15)
AST: 14 U/L — AB (ref 15–41)
Alkaline Phosphatase: 77 U/L (ref 38–126)
BILIRUBIN TOTAL: 0.6 mg/dL (ref 0.3–1.2)
BUN: 17 mg/dL (ref 8–23)
CHLORIDE: 108 mmol/L (ref 98–111)
CO2: 26 mmol/L (ref 22–32)
Calcium: 9.1 mg/dL (ref 8.9–10.3)
Creatinine, Ser: 1.03 mg/dL (ref 0.61–1.24)
GFR calc Af Amer: >60 mL/min (ref >60)
Glucose, Bld: 122 mg/dL — ABNORMAL HIGH (ref 70–99)
Potassium: 3.4 mmol/L — ABNORMAL LOW (ref 3.5–5.1)
Sodium: 143 mmol/L (ref 135–145)
TOTAL PROTEIN: 6.3 g/dL — AB (ref 6.5–8.1)

## 2018-07-20 LAB — CBC WITH DIFFERENTIAL/PLATELET
ABS IMMATURE GRANULOCYTES: 0 10*3/uL (ref 0.0–0.1)
BASOS ABS: 0.1 10*3/uL (ref 0.0–0.1)
Basophils Relative: 1 %
Eosinophils Absolute: 0.4 10*3/uL (ref 0.0–0.7)
Eosinophils Relative: 5 %
HEMATOCRIT: 42.6 % (ref 39.0–52.0)
HEMOGLOBIN: 13.2 g/dL (ref 13.0–17.0)
IMMATURE GRANULOCYTES: 0 %
LYMPHS ABS: 1.6 10*3/uL (ref 0.7–4.0)
LYMPHS PCT: 22 %
MCH: 25.5 pg — ABNORMAL LOW (ref 26.0–34.0)
MCHC: 31 g/dL (ref 30.0–36.0)
MCV: 82.2 fL (ref 78.0–100.0)
Monocytes Absolute: 0.6 10*3/uL (ref 0.1–1.0)
Monocytes Relative: 8 %
NEUTROS ABS: 4.7 10*3/uL (ref 1.7–7.7)
Neutrophils Relative %: 64 %
Platelets: 242 10*3/uL (ref 150–400)
RBC: 5.18 MIL/uL (ref 4.22–5.81)
RDW: 14.8 % (ref 11.5–15.5)
WBC: 7.3 10*3/uL (ref 4.0–10.5)

## 2018-07-20 LAB — PROTIME-INR
INR: 1.04
PROTHROMBIN TIME: 13.5 s (ref 11.4–15.2)

## 2018-07-20 LAB — APTT: APTT: 27 s (ref 24–36)

## 2018-07-20 MED ORDER — MONTELUKAST SODIUM 10 MG PO TABS
10.0000 mg | ORAL_TABLET | Freq: Every day | ORAL | Status: DC
Start: 2018-07-21 — End: 2018-07-22
  Administered 2018-07-21 – 2018-07-22 (×2): 10 mg via ORAL
  Filled 2018-07-20 (×2): qty 1

## 2018-07-20 MED ORDER — ACETAMINOPHEN 325 MG PO TABS
650.0000 mg | ORAL_TABLET | ORAL | Status: DC | PRN
  Administered 2018-07-21: 650 mg via ORAL
  Filled 2018-07-20: qty 2

## 2018-07-20 MED ORDER — CARVEDILOL 25 MG PO TABS
25.0000 mg | ORAL_TABLET | Freq: Two times a day (BID) | ORAL | Status: DC
  Administered 2018-07-21 (×2): 25 mg via ORAL
  Filled 2018-07-20: qty 1
  Filled 2018-07-20: qty 2
  Filled 2018-07-20: qty 1

## 2018-07-20 MED ORDER — LOSARTAN POTASSIUM 50 MG PO TABS
100.0000 mg | ORAL_TABLET | Freq: Every day | ORAL | Status: DC
  Administered 2018-07-21 – 2018-07-22 (×2): 100 mg via ORAL
  Filled 2018-07-20 (×2): qty 2

## 2018-07-20 MED ORDER — SODIUM CHLORIDE 0.9% FLUSH: 3.0000 mL | INTRAVENOUS | Status: DC | PRN

## 2018-07-20 MED ORDER — SODIUM CHLORIDE 0.9 % IV SOLN: 250.0000 mL | INTRAVENOUS | Status: DC | PRN

## 2018-07-20 MED ORDER — ATORVASTATIN CALCIUM 20 MG PO TABS
20.0000 mg | ORAL_TABLET | Freq: Every day | ORAL | Status: DC
  Administered 2018-07-21 – 2018-07-22 (×2): 20 mg via ORAL
  Filled 2018-07-20 (×2): qty 1

## 2018-07-20 MED ORDER — SPIRONOLACTONE 25 MG PO TABS
25.0000 mg | ORAL_TABLET | Freq: Every day | ORAL | Status: DC
  Administered 2018-07-21 – 2018-07-22 (×2): 25 mg via ORAL
  Filled 2018-07-20 (×2): qty 1

## 2018-07-20 MED ORDER — ONDANSETRON HCL 4 MG/2ML IJ SOLN: 4.0000 mg | Freq: Four times a day (QID) | INTRAMUSCULAR | Status: DC | PRN

## 2018-07-20 MED ORDER — SODIUM CHLORIDE 0.9% FLUSH
3.0000 mL | Freq: Two times a day (BID) | INTRAVENOUS | Status: DC
  Administered 2018-07-21 – 2018-07-22 (×3): 3 mL via INTRAVENOUS

## 2018-07-20 MED ORDER — FAMOTIDINE 20 MG PO TABS: 20.0000 mg | ORAL_TABLET | Freq: Two times a day (BID) | ORAL | Status: DC

## 2018-07-20 MED ORDER — PANTOPRAZOLE SODIUM 40 MG PO TBEC
80.0000 mg | DELAYED_RELEASE_TABLET | Freq: Every day | ORAL | Status: DC
  Administered 2018-07-21 – 2018-07-22 (×2): 80 mg via ORAL
  Filled 2018-07-20 (×2): qty 2

## 2018-07-20 NOTE — Telephone Encounter (Signed)
Pt called with vibration of his ICD has happened twice - last time 20 min ago.  He has no symptoms. Pt did send in transmission.  Dr. Caryl Comes available and also called [employer] --will call pt back with information.

## 2018-07-20 NOTE — Telephone Encounter (Signed)
Pt's device on check was not longer working.  Dr. Caryl Comes called the patient to have him come to ER but we were able to arrange a direct admit bed.  Pt will be asked to wait in waiting room of ER until bed ready.  Admitting has his cell number.  I have let triage nurse know that pt will wait in ER until bed ready.

## 2018-07-20 NOTE — H&P (Addendum)
CARDIOLOGY H&P  HPI:  Jeffery Thomas is a 67 y.o. 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  Patient had ICD put in for secondary prevention a proximally 1 year ago.  He noted that today he has felt 2 episodes where his ICD "buzzed."  He has not felt any shocks or palpitations.  He denies any symptoms and is in fact feeling quite well.  His device was interrogated remotely and was found to be at [facility].  Given his VF arrest within the past year, he was recommended to come to the hospital for admission for generator change.  Review of Systems:     Cardiac Review of Systems: {Y] = yes [ ]  = no  Chest Pain [    ]  Resting SOB [   ] Exertional SOB  [  ]  Orthopnea [  ]   Pedal Edema [   ]    Palpitations [  ] Syncope  [  ]   Presyncope [   ]  General Review of Systems: [Y] = yes [  ]=no Constitional: recent weight change [  ]; anorexia [  ]; fatigue [  ]; nausea [  ]; night sweats [  ]; fever [  ]; or chills [  ];                                                                     Dental: poor dentition[  ];   Eye : blurred vision [  ]; diplopia [   ]; vision changes [  ];  Amaurosis fugax[  ]; Resp: cough [  ];  wheezing[  ];  hemoptysis[  ]; shortness of breath[  ]; paroxysmal nocturnal dyspnea[  ]; dyspnea on exertion[  ]; or orthopnea[  ];  GI:  gallstones[  ], vomiting[  ];  dysphagia[  ]; melena[  ];  hematochezia [  ]; heartburn[  ];   GU: kidney stones [  ]; hematuria[  ];   dysuria [  ];  nocturia[  ];               Skin: rash [  ], swelling[  ];, hair loss[  ];  peripheral edema[  ];  or itching[  ]; Musculosketetal: myalgias[  ];  joint swelling[  ];  joint erythema[  ];  joint pain[  ];  back pain[  ];  Heme/Lymph: bruising[  ];  bleeding[  ];  anemia[  ];  Neuro: TIA[  ];  headaches[  ];  stroke[  ];  vertigo[  ];  seizures[  ];   paresthesias[  ];  difficulty walking[  ];  Psych:depression[  ];  anxiety[  ];  Endocrine: diabetes[  ];  thyroid dysfunction[  ];  Other:  Past Medical History:  Diagnosis Date  . Abdominal distention   . Acute encephalopathy   . Acute respiratory failure with hypoxia ([place])   . Acute systolic congestive heart failure ([place])   . Agitation   . 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 Benign essential HTN   . Bradycardia   . Cardiac device in situ   . Encounter for central  line placement   . Encounter for imaging study to confirm orogastric (OG) tube placement   . Gastroesophageal reflux disease   . HCAP (healthcare-associated pneumonia)   . History of cardiac arrest   . Hypertension   . Leukocytosis   . Prediabetes   . ST elevation myocardial infarction (STEMI) ([place])   . Streptococcal sore throat   . Sudden cardiac death [facility]) Jun 29, 2017   Sudden cardiac death/Takatsubo  syndrome  . Takatsuki syndrome   . Ventricular fibrillation ([place]) 29-Jun-2017    Prior to Admission medications   Medication Sig Start Date End Date Taking? Authorizing Provider  atorvastatin (LIPITOR) 20 MG tablet TAKE 1 TABLET DAILY Patient taking differently: Take 20 mg by mouth daily.  2018-06-29  Yes Lorretta Harp, MD  carvedilol (COREG) 25 MG tablet Take 1 tablet (25 mg total) by mouth 2 (two) times daily. 02/02/18  Yes Lorretta Harp, MD  ibuprofen (ADVIL,MOTRIN) 200 MG tablet Take 400 mg by mouth every 6 (six) hours as needed (pain).   Yes [provider]  losartan (COZAAR) 100 MG tablet Take 1 tablet (100 mg total) by mouth daily. 03/09/18  Yes Lorretta Harp, MD  montelukast (SINGULAIR) 10 MG tablet Take 10 mg by mouth daily.    Yes [provider]  omeprazole (PRILOSEC) 40 MG capsule Take 40 mg by mouth daily.  01/31/18  Yes [provider]  spironolactone (ALDACTONE) 25 MG tablet TAKE ONE-HALF TABLET BY MOUTH TWO TIMES A DAY Patient taking differently: Take 25 mg by mouth daily.  02/12/18  Yes Lorretta Harp, MD  amoxicillin  (AMOXIL) 875 MG tablet Take 875 mg by mouth 2 (two) times daily.    [provider]  rivaroxaban (XARELTO) 10 MG TABS tablet Take 1 tablet (10 mg total) by mouth daily with breakfast. Patient not taking: Reported on 07/20/2018 04/19/18   Ardeen Jourdain, PA-C     No Known Allergies  Social History   Socioeconomic History  . Marital status: Married    Spouse name: Not on file  . Number of children: Not on file  . Years of education: Not on file  . Highest education level: Not on file  Occupational History  . Not on file  Social Needs  . Financial resource strain: Not on file  . Food insecurity:    Worry: Not on file    Inability: Not on file  . Transportation needs:    Medical: Not on file    Non-medical: Not on file  Tobacco Use  . Smoking status: Former Smoker    Types: Cigars    Last attempt to quit: 06-29-17    Years since quitting: 1.0  . Smokeless tobacco: Never Used  Substance and Sexual Activity  . Alcohol use: Not Currently  . Drug use: Never  . Sexual activity: Not on file  Lifestyle  . Physical activity:    Days per week: Not on file    Minutes per session: Not on file  . Stress: Not on file  Relationships  . Social connections:    Talks on phone: Not on file    Gets together: Not on file    Attends religious service: Not on file    Active member of club or organization: Not on file    Attends meetings of clubs or organizations: Not on file    Relationship status: Not on file  . Intimate partner violence:    Fear of current or ex partner: Not on file  Emotionally abused: Not on file    Physically abused: Not on file    Forced sexual activity: Not on file  Other Topics Concern  . Not on file  Social History Narrative  . Not on file    Family History  Problem Relation Age of Onset  . Hypertension Mother   . Diabetes Father   . Cancer Brother        pancreatic    PHYSICAL EXAM: Vitals:   07/20/18 2105  BP: (!) 150/83  Pulse:  (!) 57  Resp: 18  Temp: 97.6 F (36.4 C)  SpO2: 96%   General:  Well appearing. No respiratory difficulty HEENT: normal Neck: supple. no JVD. Carotids 2+ bilat; no bruits. No lymphadenopathy or thryomegaly appreciated. Cor: PMI nondisplaced. Regular rate & rhythm. No rubs, gallops or murmurs. Lungs: clear Abdomen: soft, nontender, nondistended. No hepatosplenomegaly. No bruits or masses. Good bowel sounds. Extremities: no cyanosis, clubbing, rash, edema Neuro: alert & oriented x 3, cranial nerves grossly intact. moves all 4 extremities w/o difficulty. Affect pleasant.  ECG: pending  Results for orders placed or performed during the hospital encounter of 07/20/18 (from the past 24 hour(s))  Comprehensive metabolic panel     Status: Abnormal   Collection Time: 07/20/18  9:42 PM  Result Value Ref Range   Sodium 143 135 - 145 mmol/L   Potassium 3.4 (L) 3.5 - 5.1 mmol/L   Chloride 108 98 - 111 mmol/L   CO2 26 22 - 32 mmol/L   Glucose, Bld 122 (H) 70 - 99 mg/dL   BUN 17 8 - 23 mg/dL   Creatinine, Ser 1.03 0.61 - 1.24 mg/dL   Calcium 9.1 8.9 - 10.3 mg/dL   Total Protein 6.3 (L) 6.5 - 8.1 g/dL   Albumin 3.8 3.5 - 5.0 g/dL   AST 14 (L) 15 - 41 U/L   ALT 16 0 - 44 U/L   Alkaline Phosphatase 77 38 - 126 U/L   Total Bilirubin 0.6 0.3 - 1.2 mg/dL   GFR calc non Af Amer >60 >60 mL/min   GFR calc Af Amer >60 >60 mL/min   Anion gap 9 5 - 15  CBC WITH DIFFERENTIAL     Status: Abnormal   Collection Time: 07/20/18  9:42 PM  Result Value Ref Range   WBC 7.3 4.0 - 10.5 K/uL   RBC 5.18 4.22 - 5.81 MIL/uL   Hemoglobin 13.2 13.0 - 17.0 g/dL   HCT 42.6 39.0 - 52.0 %   MCV 82.2 78.0 - 100.0 fL   MCH 25.5 (L) 26.0 - 34.0 pg   MCHC 31.0 30.0 - 36.0 g/dL   RDW 14.8 11.5 - 15.5 %   Platelets 242 150 - 400 K/uL   Neutrophils Relative % 64 %   Neutro Abs 4.7 1.7 - 7.7 K/uL   Lymphocytes Relative 22 %   Lymphs Abs 1.6 0.7 - 4.0 K/uL   Monocytes Relative 8 %   Monocytes Absolute 0.6 0.1 - 1.0  K/uL   Eosinophils Relative 5 %   Eosinophils Absolute 0.4 0.0 - 0.7 K/uL   Basophils Relative 1 %   Basophils Absolute 0.1 0.0 - 0.1 K/uL   Immature Granulocytes 0 %   Abs Immature Granulocytes 0.0 0.0 - 0.1 K/uL  Protime-INR     Status: None   Collection Time: 07/20/18  9:42 PM  Result Value Ref Range   Prothrombin Time 13.5 11.4 - 15.2 seconds   INR 1.04   APTT  Status: None   Collection Time: 07/20/18  9:42 PM  Result Value Ref Range   aPTT 27 24 - 36 seconds   No results found.   ASSESSMENT: THEOPHIL THIVIERGE is a 67 y.o. 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 ICD has only been in place for 1 year. Plan to have patient see EP in AM. No evidence of clinical heart failure.   PLAN/DISCUSSION:  #) ICD battery depletion:  - admit for monitoring given history of VF arrest - will have EP see in AM - continue home medications  Marcie Mowers, MD Cardiology Fellow, PGY-6

## 2018-07-20 NOTE — Progress Notes (Unsigned)
Patient called with a vibratory alert.  A transmission was sent.  Premature battery depletion.  Device implant 1 year ago.  Implanted for secondary prevention following a VF arrest.  Patient has been advised to come to the hospital.  Dr Elliot Cousin will also be advised

## 2018-07-21 ENCOUNTER — Encounter (HOSPITAL_COMMUNITY): Payer: Self-pay

## 2018-07-21 ENCOUNTER — Other Ambulatory Visit: Payer: Self-pay

## 2018-07-21 DIAGNOSIS — I4901 Ventricular fibrillation: Secondary | ICD-10-CM

## 2018-07-21 LAB — HIV ANTIBODY (ROUTINE TESTING W REFLEX): HIV Screen 4th Generation wRfx: NONREACTIVE

## 2018-07-21 MED ORDER — CEFAZOLIN SODIUM-DEXTROSE 2-4 GM/100ML-% IV SOLN
2.0000 g | INTRAVENOUS | Status: DC
  Filled 2018-07-21 (×2): qty 100

## 2018-07-21 MED ORDER — SODIUM CHLORIDE 0.9 % IV SOLN
INTRAVENOUS | Status: DC
  Administered 2018-07-22: 07:00:00 via INTRAVENOUS

## 2018-07-21 MED ORDER — SODIUM CHLORIDE 0.9 % IV SOLN
80.0000 mg | INTRAVENOUS | Status: DC
  Filled 2018-07-21 (×2): qty 2

## 2018-07-21 NOTE — Progress Notes (Signed)
HR in low 50's. Per cardiology MD Lovena Le and PA Bhagat  continue current dose of Coreg.  Mahmoud Blazejewski, RN

## 2018-07-21 NOTE — Progress Notes (Addendum)
MRSA swab sent to lab.   Pt independently performing CHG bath as instructed.  Pt performed mouth care.   Patient has some residual questions for MD before signing consent.

## 2018-07-21 NOTE — Progress Notes (Signed)
Progress Note  Patient Name: Jeffery Thomas Date of Encounter: 07/21/2018  Primary Cardiologist: No primary care provider on file.   Subjective   No chest pain or shortness of breath.  Frustrated that he is still in the hospital  Inpatient Medications    Scheduled Meds: . atorvastatin  20 mg Oral Daily  . carvedilol  25 mg Oral BID  . losartan  100 mg Oral Daily  . montelukast  10 mg Oral Daily  . pantoprazole  80 mg Oral Daily  . sodium chloride flush  3 mL Intravenous Q12H  . spironolactone  25 mg Oral Daily   Continuous Infusions: . sodium chloride     PRN Meds: sodium chloride, acetaminophen, ondansetron (ZOFRAN) IV, sodium chloride flush   Vital Signs    Vitals:   07/20/18 2105 07/21/18 0016 07/21/18 0542 07/21/18 0923  BP: (!) 150/83 116/63 (!) 155/82 (!) 158/92  Pulse: (!) 57 (!) 58 (!) 56 (!) 55  Resp: 18 18 18 18   Temp: 97.6 F (36.4 C) 98.3 F (36.8 C) 97.8 F (36.6 C)   TempSrc: Oral Oral Oral   SpO2: 96% 96% 96% 97%  Weight: 91.2 kg  90.8 kg   Height: 5\' 11"  (1.803 m)       Intake/Output Summary (Last 24 hours) at 07/21/2018 1109 Last data filed at 07/21/2018 0932 Gross per 24 hour  Intake 120 ml  Output 200 ml  Net -80 ml   Filed Weights   07/20/18 2105 07/21/18 0542  Weight: 91.2 kg 90.8 kg    Telemetry    Normal sinus rhythm- Personally Reviewed  ECG    Normal sinus rhythm- Personally Reviewed  Physical Exam   GEN: No acute distress.   Neck: No JVD Cardiac: RRR, no murmurs, rubs, or gallops.  Respiratory: Clear to auscultation bilaterally. GI: Soft, nontender, non-distended  MS: No edema; No deformity. Neuro:  Nonfocal  Psych: Normal affect   Labs    Chemistry Recent Labs  Lab 07/17/18 0931 07/20/18 2142  NA 140 143  K 4.4 3.4*  CL 104 108  CO2 28 26  GLUCOSE 104* 122*  BUN 19 17  CREATININE 1.00 1.03  CALCIUM 9.3 9.1  PROT 6.9 6.3*  ALBUMIN 4.3 3.8  AST 9 14*  ALT 12 16  ALKPHOS 78 77  BILITOT 0.5 0.6    GFRNONAA  --  >60  GFRAA  --  >60  ANIONGAP  --  9     Hematology Recent Labs  Lab 07/17/18 0931 07/20/18 2142  WBC 7.5 7.3  RBC 5.43 5.18  HGB 13.8 13.2  HCT 42.9 42.6  MCV 79.0 82.2  MCH  --  25.5*  MCHC 32.1 31.0  RDW 16.2* 14.8  PLT 261.0 242    Cardiac EnzymesNo results for input(s): TROPONINI in the last 168 hours. No results for input(s): TROPIPOC in the last 168 hours.   BNPNo results for input(s): BNP, PROBNP in the last 168 hours.   DDimer No results for input(s): DDIMER in the last 168 hours.   Radiology    No results found.  Cardiac Studies   None  Patient Profile     67 y.o. male admitted with premature ICD battery failure, 1 year after initial implant  Assessment & Plan    1.  Ventricular fibrillation -he is status post ventricular fibrillation cardiac arrest approximately 13 months ago.  Because of this, he will require inpatient hospitalization on telemetry until his new device can be  placed 2.  ICD system failure -the patient is pending ICD generator change out.  We will hopefully be able to do this tomorrow if not Monday.  For questions or updates, please contact [place] Please consult [email or web address] for contact info under Cardiology/STEMI.      Signed, Cristopher Peru, MD  07/21/2018, 11:09 AM  Patient ID: Jeffery Thomas, male   DOB: 1951-03-21, 67 y.o.   MRN: [id number]

## 2018-07-22 ENCOUNTER — Inpatient Hospital Stay (HOSPITAL_COMMUNITY)
Admission: AD | Disposition: A | Discharge status: Home / Self Care | Payer: Self-pay | Source: Ambulatory Visit | Attending: Internal Medicine

## 2018-07-22 DIAGNOSIS — Z4502 Encounter for adjustment and management of automatic implantable cardiac defibrillator: Secondary | ICD-10-CM

## 2018-07-22 HISTORY — PX: ICD GENERATOR CHANGEOUT: EP1231

## 2018-07-22 LAB — SURGICAL PCR SCREEN
MRSA, PCR: NEGATIVE
Staphylococcus aureus: POSITIVE — AB

## 2018-07-22 SURGERY — ICD GENERATOR CHANGEOUT
Anesthesia: LOCAL

## 2018-07-22 MED ORDER — ACETAMINOPHEN 325 MG PO TABS: 325.0000 mg | ORAL_TABLET | ORAL | Status: DC | PRN

## 2018-07-22 MED ORDER — CEFAZOLIN SODIUM-DEXTROSE 2-4 GM/100ML-% IV SOLN
INTRAVENOUS | Status: AC
  Filled 2018-07-22: qty 100

## 2018-07-22 MED ORDER — CARVEDILOL 25 MG PO TABS: 12.5000 mg | ORAL_TABLET | Freq: Two times a day (BID) | ORAL | 3 refills | Status: DC

## 2018-07-22 MED ORDER — SPIRONOLACTONE 25 MG PO TABS: 25.0000 mg | ORAL_TABLET | Freq: Every day | ORAL | 6 refills | Status: DC

## 2018-07-22 MED ORDER — LIDOCAINE HCL 1 % IJ SOLN
INTRAMUSCULAR | Status: AC
  Filled 2018-07-22: qty 20

## 2018-07-22 MED ORDER — SODIUM CHLORIDE 0.9 % IV SOLN
80.0000 mg | INTRAVENOUS | Status: AC
  Administered 2018-07-22: 80 mg

## 2018-07-22 MED ORDER — MIDAZOLAM HCL 5 MG/5ML IJ SOLN
INTRAMUSCULAR | Status: DC | PRN
  Administered 2018-07-22 (×3): 1 mg
  Administered 2018-07-22: 1 mg via INTRAVENOUS

## 2018-07-22 MED ORDER — SODIUM CHLORIDE 0.9 % IV SOLN
INTRAVENOUS | Status: AC
  Filled 2018-07-22: qty 2

## 2018-07-22 MED ORDER — MUPIROCIN 2 % EX OINT
TOPICAL_OINTMENT | Freq: Two times a day (BID) | CUTANEOUS | Status: DC
  Administered 2018-07-22: 1 via NASAL
  Administered 2018-07-22: 11:00:00 via NASAL
  Filled 2018-07-22: qty 22

## 2018-07-22 MED ORDER — FENTANYL CITRATE (PF) 100 MCG/2ML IJ SOLN
INTRAMUSCULAR | Status: DC | PRN
  Administered 2018-07-22: 25 ug via INTRAVENOUS
  Administered 2018-07-22 (×2): 12.5 ug via INTRAVENOUS

## 2018-07-22 MED ORDER — FENTANYL CITRATE (PF) 100 MCG/2ML IJ SOLN
INTRAMUSCULAR | Status: AC
  Filled 2018-07-22: qty 2

## 2018-07-22 MED ORDER — CEFAZOLIN SODIUM-DEXTROSE 2-3 GM-%(50ML) IV SOLR
INTRAVENOUS | Status: AC | PRN
  Administered 2018-07-22: 2 g via INTRAVENOUS

## 2018-07-22 MED ORDER — ONDANSETRON HCL 4 MG/2ML IJ SOLN: 4.0000 mg | Freq: Four times a day (QID) | INTRAMUSCULAR | Status: DC | PRN

## 2018-07-22 MED ORDER — MIDAZOLAM HCL 5 MG/5ML IJ SOLN
INTRAMUSCULAR | Status: AC
  Filled 2018-07-22: qty 5

## 2018-07-22 MED ORDER — CEFAZOLIN SODIUM-DEXTROSE 2-4 GM/100ML-% IV SOLN: 2.0000 g | INTRAVENOUS | Status: DC

## 2018-07-22 SURGICAL SUPPLY — 4 items
CABLE SURGICAL S-101-97-12 (CABLE) ×2 IMPLANT
ICD ELLIPSE VR CD1411-36Q (ICD Generator) ×2 IMPLANT
PAD PRO RADIOLUCENT 2001M-C (PAD) ×2 IMPLANT
TRAY PACEMAKER INSERTION (PACKS) ×2 IMPLANT

## 2018-07-22 NOTE — Progress Notes (Signed)
Progress Note  Patient Name: Jeffery Thomas Date of Encounter: 07/22/2018  Primary Cardiologist: Quay Burow, MD   Subjective   Stable this morning. No chest pain or sob.   Inpatient Medications    Scheduled Meds: . atorvastatin  20 mg Oral Daily  . carvedilol  25 mg Oral BID  . [START ON 07/23/2018] gentamicin irrigation  80 mg Irrigation To SSTC  . losartan  100 mg Oral Daily  . montelukast  10 mg Oral Daily  . mupirocin ointment   Nasal BID  . pantoprazole  80 mg Oral Daily  . sodium chloride flush  3 mL Intravenous Q12H  . spironolactone  25 mg Oral Daily   Continuous Infusions: . sodium chloride    . sodium chloride 50 mL/hr at 07/22/18 0659  . [START ON 07/23/2018]  ceFAZolin (ANCEF) IV     PRN Meds: sodium chloride, acetaminophen, ondansetron (ZOFRAN) IV, sodium chloride flush   Vital Signs    Vitals:   07/21/18 1748 07/21/18 1934 07/21/18 2346 07/22/18 0454  BP: (!) 143/80 111/62 113/75 (!) 145/93  Pulse: (!) 55 (!) 55 (!) 51 (!) 53  Resp: 20 20 20 20   Temp: 97.7 F (36.5 C) 97.8 F (36.6 C) 97.9 F (36.6 C) 97.8 F (36.6 C)  TempSrc: Oral Oral Oral Oral  SpO2: 97% 97% 97% 96%  Weight:    88.7 kg  Height:        Intake/Output Summary (Last 24 hours) at 07/22/2018 0807 Last data filed at 07/21/2018 2300 Gross per 24 hour  Intake 483 ml  Output 1000 ml  Net -517 ml   Filed Weights   07/20/18 2105 07/21/18 0542 07/22/18 0454  Weight: 91.2 kg 90.8 kg 88.7 kg    Telemetry    Sinus bradycardia - Personally Reviewed  ECG    none - Personally Reviewed  Physical Exam   GEN: No acute distress.   Neck: No JVD Cardiac: Reg brady, no murmurs, rubs, or gallops.  Respiratory: Clear to auscultation bilaterally. GI: Soft, nontender, non-distended  MS: No edema; No deformity. Neuro:  Nonfocal  Psych: Normal affect   Labs    Chemistry Recent Labs  Lab 07/17/18 0931 07/20/18 2142  NA 140 143  K 4.4 3.4*  CL 104 108  CO2 28 26    GLUCOSE 104* 122*  BUN 19 17  CREATININE 1.00 1.03  CALCIUM 9.3 9.1  PROT 6.9 6.3*  ALBUMIN 4.3 3.8  AST 9 14*  ALT 12 16  ALKPHOS 78 77  BILITOT 0.5 0.6  GFRNONAA  --  >60  GFRAA  --  >60  ANIONGAP  --  9     Hematology Recent Labs  Lab 07/17/18 0931 07/20/18 2142  WBC 7.5 7.3  RBC 5.43 5.18  HGB 13.8 13.2  HCT 42.9 42.6  MCV 79.0 82.2  MCH  --  25.5*  MCHC 32.1 31.0  RDW 16.2* 14.8  PLT 261.0 242    Cardiac EnzymesNo results for input(s): TROPONINI in the last 168 hours. No results for input(s): TROPIPOC in the last 168 hours.   BNPNo results for input(s): BNP, PROBNP in the last 168 hours.   DDimer No results for input(s): DDIMER in the last 168 hours.   Radiology    No results found.  Cardiac Studies   none  Patient Profile     67 y.o. male admitted with premature battery depletion on his ICD in the setting of a secondary prevention indication  Assessment &  Plan    1. ICD battery depletion - we will plan to proceed with ICD gen change today and dc home later today. 2. VF - he has had no ventricular arrhythmias 3. Sinus bradycardia - he will likely need his beta blocker reduced.  For questions or updates, please contact [place] Please consult [email or web address] for contact info under Cardiology/STEMI.      Signed, Cristopher Peru, MD  07/22/2018, 8:07 AM  Patient ID: [name], male   DOB: [birth year], 67 y.o.   MRN: [id number]

## 2018-07-22 NOTE — Discharge Instructions (Signed)

## 2018-07-22 NOTE — Discharge Summary (Addendum)
Discharge Summary    Patient ID: Jeffery Thomas MRN: [id number]; DOB: [birth year]  Admit date: 07/20/2018 Discharge date: 07/22/2018  Primary Care Provider: Bernerd Limbo, MD  Primary Cardiologist: Quay Burow, MD  Primary Electrophysiologist:Dr. Caryl Comes  Discharge Diagnoses    Active Problems:   ICD (implantable cardioverter-defibrillator) battery depletion  Hx of VF arrest  Allergies No Known Allergies  Diagnostic Studies/Procedures    ICD GENERATOR CHANGEOUT  Conclusion   Conclusion: Successful removal of a [employer]. Jude ICD which had premature battery depletion, and insertion of a new [employer]. Jude ICD in a patient with a prior VF arrest.  Jeffery Thomas,M.D.  Procedural Details/Technique   Technical Details EP Procedure Note  Preoperative diagnosis: ICD with premature battery depletion, prior VF arrest  Postoperative diagnosis: same as preoperative diagnosis  Procedure Performed: removal of the ICD generator which had depleted and insertion of a new ICD generator  Description of the Procedure: after informed consent was obtained, the patient was prepped and draped in a sterile fashion. 30 cc of lidocaine was infiltrated into the left infraclavicular region. IV versed and fentanyl was used for sedation. A 5 cm incision was made and electrocautery used to dissect down to the fascia. The old ICD was removed and the new [employer]. Jude ICD [phone] was connected to the old ICD lead. The R waves measured 11 mV, the impedence was 450 ohms, and the threshold was 0.5 V at 0.5 ms.  The pocket was irrigated with antibiotic irrigation. The incision was closed with 2 layers of vicryl suture. Benzoin and steristrips were painted on the skin. A bandage was placed. The patient was return to his room in stable condition.   Complications: none immediately.   Estimated blood loss <50 mL.  During this procedure the patient was administered the following to achieve and maintain moderate conscious  sedation: Versed 3 mg, Fentanyl 50 mcg, while the patient's heart rate, blood pressure, and oxygen saturation were continuously monitored. The period of conscious sedation was 19 minutes, of which I was present face-to-face 100% of this time.      History of Present Illness     Jeffery Thomas is a 67 y.o. 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  Patient had ICD put in for secondary prevention a proximally 1 year ago.  He has felt 2 episodes where his ICD "buzzed."  He has not felt any shocks or palpitations.  He denies any symptoms and is in fact feeling quite well.  His device was interrogated remotely and was found to be at [facility]. Given his VF arrest within the past year, he was recommended to come to the hospital for admission for generator change.  Hospital Course     Consultants: None  Patient was admitted for monitoring given hx of VF arrest. No arrhthymias or shock during admission. He under went successful removal of a St. Jude ICD which had premature battery depletion, and insertion of a new St. Jude ICD. He was noted bradycardic on tele and reduced coreg to 12.5mg  BID at discharge. Seen by Dr. Lovena Le and deemed stable for discharge.   Discharge Vitals Blood pressure 138/81, pulse (!) 59, temperature 97.6 F (36.4 C), temperature source Oral, resp. rate 15, height 5\' 11"  (1.803 m), weight 88.7 kg, SpO2 94 %.  Filed Weights   07/20/18 2105 07/21/18 0542 07/22/18 0454  Weight: 91.2 kg 90.8 kg 88.7 kg  Labs & Radiologic Studies    CBC Recent Labs    07/20/18 2142  WBC 7.3  NEUTROABS 4.7  HGB 13.2  HCT 42.6  MCV 82.2  PLT 160   Basic Metabolic Panel Recent Labs    07/20/18 2142  NA 143  K 3.4*  CL 108  CO2 26  GLUCOSE 122*  BUN 17  CREATININE 1.03  CALCIUM 9.1   Liver Function Tests Recent Labs    07/20/18 2142  AST 14*  ALT 16  ALKPHOS 77  BILITOT 0.6  PROT 6.3*    ALBUMIN 3.8    _____________  No results found. Disposition   Pt is being discharged home today in good condition.  Follow-up Plans & Appointments    Follow-up Information    [facility] up.   Specialty:  Cardiology Why:  office will call call with time and date of wound check  Contact information: [address] [place] [phone]         Discharge Instructions    Diet - low sodium heart healthy   Complete by:  As directed    Increase activity slowly   Complete by:  As directed       Discharge Medications   Allergies as of 07/22/2018   No Known Allergies     Medication List    STOP taking these medications   amoxicillin 875 MG tablet Commonly known as:  AMOXIL   rivaroxaban 10 MG Tabs tablet Commonly known as:  XARELTO     TAKE these medications   atorvastatin 20 MG tablet Commonly known as:  LIPITOR TAKE 1 TABLET DAILY   carvedilol 25 MG tablet Commonly known as:  COREG Take 0.5 tablets (12.5 mg total) by mouth 2 (two) times daily. What changed:  how much to take   ibuprofen 200 MG tablet Commonly known as:  ADVIL,MOTRIN Take 400 mg by mouth every 6 (six) hours as needed (pain).   losartan 100 MG tablet Commonly known as:  COZAAR Take 1 tablet (100 mg total) by mouth daily.   montelukast 10 MG tablet Commonly known as:  SINGULAIR Take 10 mg by mouth daily.   omeprazole 40 MG capsule Commonly known as:  PRILOSEC Take 40 mg by mouth daily.   spironolactone 25 MG tablet Commonly known as:  ALDACTONE Take 1 tablet (25 mg total) by mouth daily.        Acute coronary syndrome (MI, NSTEMI, STEMI, etc) this admission?: No.    Outstanding Labs/Studies   None  Duration of Discharge Encounter   Greater than 30 minutes including physician time.  Jarrett Soho, PA 07/22/2018, 12:57 PM  EP Attending  Patient seen and examined. Agree with above. He  has undergone ICD gen change and is stable for DC home. Usual followup.  Mikle Bosworth.D.

## 2018-07-22 NOTE — Interval H&P Note (Signed)
History and Physical Interval Note:  07/22/2018 8:18 AM  Jeffery Thomas  has presented today for surgery, with the diagnosis of eol GEN  The various methods of treatment have been discussed with the patient and family. After consideration of risks, benefits and other options for treatment, the patient has consented to  Procedure(s): ICD [place] (N/A) as a surgical intervention .  The patient's history has been reviewed, patient examined, no change in status, stable for surgery.  I have reviewed the patient's chart and labs.  Questions were answered to the patient's satisfaction.     Cristopher Peru

## 2018-07-22 NOTE — Progress Notes (Signed)
Discussed discharge instruction with pt and wife.  Discussed S&S of infection, medications and follow up appointments. Sent instruction home with family including handout for  ICD battery change aftercare.  Pt did not have any further questions

## 2018-07-22 NOTE — H&P (View-Only) (Signed)
Progress Note  Patient Name: Jeffery Thomas Date of Encounter: 07/22/2018  Primary Cardiologist: Quay Burow, MD   Subjective   Stable this morning. No chest pain or sob.   Inpatient Medications    Scheduled Meds: . atorvastatin  20 mg Oral Daily  . carvedilol  25 mg Oral BID  . [START ON 07/23/2018] gentamicin irrigation  80 mg Irrigation To SSTC  . losartan  100 mg Oral Daily  . montelukast  10 mg Oral Daily  . mupirocin ointment   Nasal BID  . pantoprazole  80 mg Oral Daily  . sodium chloride flush  3 mL Intravenous Q12H  . spironolactone  25 mg Oral Daily   Continuous Infusions: . sodium chloride    . sodium chloride 50 mL/hr at 07/22/18 0659  . [START ON 07/23/2018]  ceFAZolin (ANCEF) IV     PRN Meds: sodium chloride, acetaminophen, ondansetron (ZOFRAN) IV, sodium chloride flush   Vital Signs    Vitals:   07/21/18 1748 07/21/18 1934 07/21/18 2346 07/22/18 0454  BP: (!) 143/80 111/62 113/75 (!) 145/93  Pulse: (!) 55 (!) 55 (!) 51 (!) 53  Resp: 20 20 20 20   Temp: 97.7 F (36.5 C) 97.8 F (36.6 C) 97.9 F (36.6 C) 97.8 F (36.6 C)  TempSrc: Oral Oral Oral Oral  SpO2: 97% 97% 97% 96%  Weight:    88.7 kg  Height:        Intake/Output Summary (Last 24 hours) at 07/22/2018 0807 Last data filed at 07/21/2018 2300 Gross per 24 hour  Intake 483 ml  Output 1000 ml  Net -517 ml   Filed Weights   07/20/18 2105 07/21/18 0542 07/22/18 0454  Weight: 91.2 kg 90.8 kg 88.7 kg    Telemetry    Sinus bradycardia - Personally Reviewed  ECG    none - Personally Reviewed  Physical Exam   GEN: No acute distress.   Neck: No JVD Cardiac: Reg brady, no murmurs, rubs, or gallops.  Respiratory: Clear to auscultation bilaterally. GI: Soft, nontender, non-distended  MS: No edema; No deformity. Neuro:  Nonfocal  Psych: Normal affect   Labs    Chemistry Recent Labs  Lab 07/17/18 0931 07/20/18 2142  NA 140 143  K 4.4 3.4*  CL 104 108  CO2 28 26    GLUCOSE 104* 122*  BUN 19 17  CREATININE 1.00 1.03  CALCIUM 9.3 9.1  PROT 6.9 6.3*  ALBUMIN 4.3 3.8  AST 9 14*  ALT 12 16  ALKPHOS 78 77  BILITOT 0.5 0.6  GFRNONAA  --  >60  GFRAA  --  >60  ANIONGAP  --  9     Hematology Recent Labs  Lab 07/17/18 0931 07/20/18 2142  WBC 7.5 7.3  RBC 5.43 5.18  HGB 13.8 13.2  HCT 42.9 42.6  MCV 79.0 82.2  MCH  --  25.5*  MCHC 32.1 31.0  RDW 16.2* 14.8  PLT 261.0 242    Cardiac EnzymesNo results for input(s): TROPONINI in the last 168 hours. No results for input(s): TROPIPOC in the last 168 hours.   BNPNo results for input(s): BNP, PROBNP in the last 168 hours.   DDimer No results for input(s): DDIMER in the last 168 hours.   Radiology    No results found.  Cardiac Studies   none  Patient Profile     67 y.o. male admitted with premature battery depletion on his ICD in the setting of a secondary prevention indication  Assessment &  Plan    1. ICD battery depletion - we will plan to proceed with ICD gen change today and dc home later today. 2. VF - he has had no ventricular arrhythmias 3. Sinus bradycardia - he will likely need his beta blocker reduced.  For questions or updates, please contact [place] Please consult [email or web address] for contact info under Cardiology/STEMI.      Signed, Cristopher Peru, MD  07/22/2018, 8:07 AM  Patient ID: Jeffery Thomas, male   DOB: 1951-04-15, 66 y.o.   MRN: [id number]

## 2018-07-22 NOTE — Care Management Note (Signed)
Case Management Note  Patient Details  Name: Jeffery Thomas MRN: [id number] Date of Birth: 24-Mar-1951  Subjective/Objective:      Pt presented for ICD replacement as his 67 year old ICD battery failed.  Pt is from home with wife and has recently recovered from knee replacement surgery. Wife at [facility] and obviously involved in care, asking appropriate questions and reporting some information.  Pt has completed PT and now walking unassisted but wearing brace to aid in flexion.  Pt is in close contact with cardiologist (and office staff who read his ICD reports) and has recently changed PCP to Dr. Luetta Nutting [facility]) so he could be within the [place]. Pt now drives self to appointments and receives medication through mail-order delivery.   Pt states he follows low-Na diet and weighs self as directed.           Action/Plan: No CM needs at this time.  Pt and wife are proactive in health care needs.  Expected Discharge Date:  07/22/18               Expected Discharge Plan:     In-House Referral:   N/A  Discharge planning Services     Post Acute Care Choice:    Choice offered to:     DME Arranged:    DME Agency:     HH Arranged:    HH Agency:     Status of Service:     If discussed at [employer], dates discussed:    Additional Comments:  Claudie Leach, RN 07/22/2018, 11:01 AM

## 2018-07-23 ENCOUNTER — Ambulatory Visit (INDEPENDENT_AMBULATORY_CARE_PROVIDER_SITE_OTHER): Payer: Medicare Other

## 2018-07-23 ENCOUNTER — Encounter (HOSPITAL_COMMUNITY): Payer: Self-pay | Admitting: Internal Medicine

## 2018-07-23 DIAGNOSIS — I428 Other cardiomyopathies: Secondary | ICD-10-CM

## 2018-07-23 DIAGNOSIS — Z95810 Presence of automatic (implantable) cardiac defibrillator: Secondary | ICD-10-CM

## 2018-07-23 MED FILL — Lidocaine HCl Local Inj 1%: INTRAMUSCULAR | Qty: 60 | Status: AC

## 2018-07-23 NOTE — Progress Notes (Signed)
Patient had premature battery depletion and generator change out procedure 07/22/2018.  ICM remote transmission rescheduled for 09/03/2018

## 2018-07-24 ENCOUNTER — Other Ambulatory Visit: Payer: Self-pay | Admitting: Cardiovascular Disease

## 2018-08-01 ENCOUNTER — Ambulatory Visit (INDEPENDENT_AMBULATORY_CARE_PROVIDER_SITE_OTHER): Payer: Medicare Other | Admitting: *Deleted

## 2018-08-01 DIAGNOSIS — I4901 Ventricular fibrillation: Secondary | ICD-10-CM | POA: Diagnosis not present

## 2018-08-07 ENCOUNTER — Encounter: Payer: Self-pay | Admitting: Family Medicine

## 2018-08-07 ENCOUNTER — Ambulatory Visit (INDEPENDENT_AMBULATORY_CARE_PROVIDER_SITE_OTHER): Payer: Medicare Other | Admitting: Family Medicine

## 2018-08-07 VITALS — BP 98/70 | HR 68 | Temp 98.2°F | Ht 71.0 in | Wt 194.0 lb

## 2018-08-07 DIAGNOSIS — M545 Low back pain, unspecified: Secondary | ICD-10-CM | POA: Insufficient documentation

## 2018-08-07 MED ORDER — CYCLOBENZAPRINE HCL 10 MG PO TABS: 10.0000 mg | ORAL_TABLET | Freq: Three times a day (TID) | ORAL | 0 refills | Status: DC | PRN

## 2018-08-07 MED ORDER — KETOROLAC TROMETHAMINE 60 MG/2ML IM SOLN
60.0000 mg | Freq: Once | INTRAMUSCULAR | Status: AC
  Administered 2018-08-07: 60 mg via INTRAMUSCULAR

## 2018-08-07 MED ORDER — METHYLPREDNISOLONE 4 MG PO TBPK: ORAL_TABLET | ORAL | 0 refills | Status: DC

## 2018-08-07 NOTE — Progress Notes (Signed)
Jeffery Thomas - 67 y.o. male MRN [id number]  Date of birth: [birth year]  Subjective Chief Complaint  Patient presents with  . Back Pain    HPI Jeffery Thomas is a 67 y.o. male here today with complaint of low back pain.  He reports that he was moving a 25lb safe yesterday and his back "locked up".   Has remained painful into today.  Denies radiation of pain, numbness or tingling.  Denies weakness in his legs.   Tried one of his wife's muscle relaxers (5mg  flexeril) last night but did not get a whole lot of relief from this.    ROS:  A comprehensive ROS was completed and negative except as noted per HPI  No Known Allergies  Past Medical History:  Diagnosis Date  . Abdominal distention   . Acute encephalopathy   . Acute respiratory failure with hypoxia ([place])   . Acute systolic congestive heart failure ([place])   . Agitation   . 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 Benign essential HTN   . Bradycardia   . Cardiac device in situ   . Encounter for central line placement   . Encounter for imaging study to confirm orogastric (OG) tube placement   . Gastroesophageal reflux disease   . HCAP (healthcare-associated pneumonia)   . History of cardiac arrest   . Hypertension   . Leukocytosis   . Prediabetes   . ST elevation myocardial infarction (STEMI) ([place])   . Streptococcal sore throat   . Sudden cardiac death [facility]) 07-19-17   Sudden cardiac death/Takatsubo  syndrome  . Takatsuki syndrome   . Ventricular fibrillation ([place]) 19-Jul-2017    Past Surgical History:  Procedure Laterality Date  . ICD GENERATOR CHANGEOUT N/A 07/22/2018   Procedure: [place];  Surgeon: Evans Lance, MD;  Location: [facility];  Service: Cardiovascular;  Laterality: N/A;  . ICD IMPLANT N/A 07/04/2017   SJM Fortify Assura VR ICD implanted by Dr Rayann Heman for secondary prevention after VF arrest  . LEFT HEART CATH AND CORONARY ANGIOGRAPHY N/A 07-19-17   Procedure: LEFT  HEART CATH AND CORONARY ANGIOGRAPHY;  Surgeon: Lorretta Harp, MD;  Location: [facility];  Service: Cardiovascular;  Laterality: N/A;  . MENISCUS REPAIR  1983  . TOTAL KNEE REVISION Left 04/18/2018   Procedure: LEFT TOTAL KNEE REVISION;  Surgeon: Gaynelle Arabian, MD;  Location: [facility];  Service: Orthopedics;  Laterality: Left;  Adductor Block    Social History   Socioeconomic History  . Marital status: Married    Spouse name: Not on file  . Number of children: Not on file  . Years of education: Not on file  . Highest education level: Not on file  Occupational History  . Not on file  Social Needs  . Financial resource strain: Not on file  . Food insecurity:    Worry: Not on file    Inability: Not on file  . Transportation needs:    Medical: Not on file    Non-medical: Not on file  Tobacco Use  . Smoking status: Former Smoker    Types: Cigars    Last attempt to quit: 07/19/2017    Years since quitting: 1.1  . Smokeless tobacco: Never Used  Substance and Sexual Activity  . Alcohol use: Not Currently  . Drug use: Never  . Sexual activity: Not on file  Lifestyle  . Physical activity:    Days per week: Not on file    Minutes  per session: Not on file  . Stress: Not on file  Relationships  . Social connections:    Talks on phone: Not on file    Gets together: Not on file    Attends religious service: Not on file    Active member of club or organization: Not on file    Attends meetings of clubs or organizations: Not on file    Relationship status: Not on file  Other Topics Concern  . Not on file  Social History Narrative  . Not on file    Family History  Problem Relation Age of Onset  . Hypertension Mother   . Diabetes Father   . Cancer Brother        pancreatic    Health Maintenance  Topic Date Due  . Hepatitis C Screening  1951/10/15  . COLONOSCOPY  05/22/2003  . INFLUENZA VACCINE  03/06/2019 (Originally 06/07/2018)  . TETANUS/TDAP  07/18/2019  (Originally 05/21/1972)  . PNA vac Low Risk Adult (2 of 2 - PPSV23) 07/18/2019  . HIV Screening  Completed    ----------------------------------------------------------------------------------------------------------------------------------------------------------------------------------------------------------------- Physical Exam BP 98/70   Pulse 68   Temp 98.2 F (36.8 C) (Oral)   Ht 5\' 11"  (1.803 m)   Wt 194 lb (88 kg)   SpO2 98%   BMI 27.06 kg/m   Physical Exam  Constitutional: He is oriented to person, place, and time. He appears well-nourished. No distress.  HENT:  Head: Normocephalic and atraumatic.  Eyes: No scleral icterus.  Musculoskeletal:  ROM of L spine limited in flexion and rotational movement.  Negative SLR bilaterally.  Normal strength and sensation.   Neurological: He is alert and oriented to person, place, and time. He displays normal reflexes. He exhibits normal muscle tone.  Psychiatric: He has a normal mood and affect. His behavior is normal.    ------------------------------------------------------------------------------------------------------------------------------------------------------------------------------------------------------------------- Assessment and Plan  Acute left-sided low back pain without sciatica -Low back strain.  Reviewed proper lifting technique.  -Injection of toradol given today -Start medrol dosepak and flexeril 10mg  -Given handout for HEP -Call or return if not improving.

## 2018-08-07 NOTE — Assessment & Plan Note (Signed)
-  Low back strain.  Reviewed proper lifting technique.  -Injection of toradol given today -Start medrol dosepak and flexeril 10mg  -Given handout for HEP -Call or return if not improving.

## 2018-08-07 NOTE — Patient Instructions (Signed)
Low Back Sprain Rehab  Ask your health care provider which exercises are safe for you. Do exercises exactly as told by your health care provider and adjust them as directed. It is normal to feel mild stretching, pulling, tightness, or discomfort as you do these exercises, but you should stop right away if you feel sudden pain or your pain gets worse. Do not begin these exercises until told by your health care provider.  Stretching and range of motion exercises  These exercises warm up your muscles and joints and improve the movement and flexibility of your back. These exercises also help to relieve pain, numbness, and tingling.  Exercise A: Lumbar rotation    1. Lie on your back on a firm surface and bend your knees.  2. Straighten your arms out to your sides so each arm forms an "L" shape with a side of your body (a 90 degree angle).  3. Slowly move both of your knees to one side of your body until you feel a stretch in your lower back. Try not to let your shoulders move off of the floor.  4. Hold for __________ seconds.  5. Tense your abdominal muscles and slowly move your knees back to the starting position.  6. Repeat this exercise on the other side of your body.  Repeat __________ times. Complete this exercise __________ times a day.  Exercise B: Prone extension on elbows    1. Lie on your abdomen on a firm surface.  2. Prop yourself up on your elbows.  3. Use your arms to help lift your chest up until you feel a gentle stretch in your abdomen and your lower back.  ? This will place some of your body weight on your elbows. If this is uncomfortable, try stacking pillows under your chest.  ? Your hips should stay down, against the surface that you are lying on. Keep your hip and back muscles relaxed.  4. Hold for __________ seconds.  5. Slowly relax your upper body and return to the starting position.  Repeat __________ times. Complete this exercise __________ times a day.  Strengthening exercises  These  exercises build strength and endurance in your back. Endurance is the ability to use your muscles for a long time, even after they get tired.  Exercise C: Pelvic tilt  1. Lie on your back on a firm surface. Bend your knees and keep your feet flat.  2. Tense your abdominal muscles. Tip your pelvis up toward the ceiling and flatten your lower back into the floor.  ? To help with this exercise, you may place a small towel under your lower back and try to push your back into the towel.  3. Hold for __________ seconds.  4. Let your muscles relax completely before you repeat this exercise.  Repeat __________ times. Complete this exercise __________ times a day.  Exercise D: Alternating arm and leg raises    1. Get on your hands and knees on a firm surface. If you are on a hard floor, you may want to use padding to cushion your knees, such as an exercise mat.  2. Line up your arms and legs. Your hands should be below your shoulders, and your knees should be below your hips.  3. Lift your left leg behind you. At the same time, raise your right arm and straighten it in front of you.  ? Do not lift your leg higher than your hip.  ? Do not lift your arm   higher than your shoulder.  ? Keep your abdominal and back muscles tight.  ? Keep your hips facing the ground.  ? Do not arch your back.  ? Keep your balance carefully, and do not hold your breath.  4. Hold for __________ seconds.  5. Slowly return to the starting position and repeat with your right leg and your left arm.  Repeat __________ times. Complete this exercise __________ times a day.  Exercise E: Abdominal set with straight leg raise    1. Lie on your back on a firm surface.  2. Bend one of your knees and keep your other leg straight.  3. Tense your abdominal muscles and lift your straight leg up, 4-6 inches (10-15 cm) off the ground.  4. Keep your abdominal muscles tight and hold for __________ seconds.  ? Do not hold your breath.  ? Do not arch your back. Keep it  flat against the ground.  5. Keep your abdominal muscles tense as you slowly lower your leg back to the starting position.  6. Repeat with your other leg.  Repeat __________ times. Complete this exercise __________ times a day.  Posture and body mechanics    Body mechanics refers to the movements and positions of your body while you do your daily activities. Posture is part of body mechanics. Good posture and healthy body mechanics can help to relieve stress in your body's tissues and joints. Good posture means that your spine is in its natural S-curve position (your spine is neutral), your shoulders are pulled back slightly, and your head is not tipped forward. The following are general guidelines for applying improved posture and body mechanics to your everyday activities.  Standing    · When standing, keep your spine neutral and your feet about hip-width apart. Keep a slight bend in your knees. Your ears, shoulders, and hips should line up.  · When you do a task in which you stand in one place for a long time, place one foot up on a stable object that is 2-4 inches (5-10 cm) high, such as a footstool. This helps keep your spine neutral.  Sitting    · When sitting, keep your spine neutral and keep your feet flat on the floor. Use a footrest, if necessary, and keep your thighs parallel to the floor. Avoid rounding your shoulders, and avoid tilting your head forward.  · When working at a desk or a computer, keep your desk at a height where your hands are slightly lower than your elbows. Slide your chair under your desk so you are close enough to maintain good posture.  · When working at a computer, place your monitor at a height where you are looking straight ahead and you do not have to tilt your head forward or downward to look at the screen.  Resting    · When lying down and resting, avoid positions that are most painful for you.  · If you have pain with activities such as sitting, bending, stooping, or squatting  (flexion-based activities), lie in a position in which your body does not bend very much. For example, avoid curling up on your side with your arms and knees near your chest (fetal position).  · If you have pain with activities such as standing for a long time or reaching with your arms (extension-based activities), lie with your spine in a neutral position and bend your knees slightly. Try the following positions:  · Lying on your side with a   pillow between your knees.  · Lying on your back with a pillow under your knees.  Lifting    · When lifting objects, keep your feet at least shoulder-width apart and tighten your abdominal muscles.  · Bend your knees and hips and keep your spine neutral. It is important to lift using the strength of your legs, not your back. Do not lock your knees straight out.  · Always ask for help to lift heavy or awkward objects.  This information is not intended to replace advice given to you by your health care provider. Make sure you discuss any questions you have with your health care provider.  Document Released: 10/24/2005 Document Revised: 06/30/2016 Document Reviewed: 08/05/2015  Elsevier Interactive Patient Education © 2018 [employer]

## 2018-08-08 LAB — CUP PACEART INCLINIC DEVICE CHECK
Battery Remaining Longevity: 106 mo
Brady Statistic RV Percent Paced: 0.06 %
Date Time Interrogation Session: 20190925150114
HIGH POWER IMPEDANCE MEASURED VALUE: 66.375
Implantable Pulse Generator Implant Date: 20190915
Lead Channel Pacing Threshold Amplitude: 0.5 V
Lead Channel Pacing Threshold Amplitude: 0.5 V
Lead Channel Pacing Threshold Pulse Width: 0.5 ms
Lead Channel Setting Pacing Amplitude: 2.5 V
Lead Channel Setting Pacing Pulse Width: 0.5 ms
Lead Channel Setting Sensing Sensitivity: 0.5 mV
MDC IDC LEAD IMPLANT DT: 20180828
MDC IDC LEAD LOCATION: 753860
MDC IDC MSMT LEADCHNL RV IMPEDANCE VALUE: 437.5 Ohm
MDC IDC MSMT LEADCHNL RV PACING THRESHOLD PULSEWIDTH: 0.5 ms
MDC IDC MSMT LEADCHNL RV SENSING INTR AMPL: 11.9 mV
Pulse Gen Serial Number: 9851932

## 2018-08-08 NOTE — Progress Notes (Signed)
Wound check appointment. Steri-strips removed. Wound without redness or edema. Incision edges approximated, wound well healed. Normal device function. Threshold, sensing, and impedances consistent with implant measurements. Device programmed at appropriate safety margins. Histogram distribution appropriate for patient and level of activity. No ventricular arrhythmias noted. Patient educated about wound care, arm mobility, and shock plan. ROV in 3 months with JA.

## 2018-09-03 ENCOUNTER — Ambulatory Visit (INDEPENDENT_AMBULATORY_CARE_PROVIDER_SITE_OTHER): Payer: Medicare Other

## 2018-09-03 DIAGNOSIS — I428 Other cardiomyopathies: Secondary | ICD-10-CM | POA: Diagnosis not present

## 2018-09-03 DIAGNOSIS — Z95810 Presence of automatic (implantable) cardiac defibrillator: Secondary | ICD-10-CM | POA: Diagnosis not present

## 2018-09-04 ENCOUNTER — Telehealth: Payer: Self-pay

## 2018-09-04 NOTE — Telephone Encounter (Signed)
Remote ICM transmission received.  Attempted call to patient regarding ICM remote transmission and left message to return call   

## 2018-09-04 NOTE — Progress Notes (Signed)
EPIC Encounter for ICM Monitoring  Patient Name: Jeffery Thomas is a 67 y.o. male Date: 09/04/2018 Primary Care Physican: Bernerd Limbo, MD Primary Cardiologist:Berry Electrophysiologist:Allred Dry Weight:194lbs (latest office weight)       Attempted call to patient and unable to reach.  Left message to return call.  Transmission reviewed.    Thoracic impedance normal.   Recommendations: Unable to reach.  Follow-up plan: ICM clinic phone appointment on 10/08/2018.   Office appointment scheduled 10/29/2018 with Dr. Rayann Heman.    Copy of ICM check sent to Dr. Rayann Heman.   3 month ICM trend: 09/03/2018    1 Year ICM trend:       Rosalene Billings, RN 09/04/2018 3:14 PM

## 2018-10-08 ENCOUNTER — Ambulatory Visit (INDEPENDENT_AMBULATORY_CARE_PROVIDER_SITE_OTHER): Payer: Medicare Other

## 2018-10-08 DIAGNOSIS — Z95810 Presence of automatic (implantable) cardiac defibrillator: Secondary | ICD-10-CM | POA: Diagnosis not present

## 2018-10-08 DIAGNOSIS — I428 Other cardiomyopathies: Secondary | ICD-10-CM

## 2018-10-09 ENCOUNTER — Telehealth: Payer: Self-pay

## 2018-10-09 NOTE — Progress Notes (Signed)
EPIC Encounter for ICM Monitoring  Patient Name: Jeffery Thomas is a 67 y.o. male Date: 10/09/2018 Primary Care Physican: Bernerd Limbo, MD Primary Cardiologist:Berry Electrophysiologist:Allred Last Weight:194lbs (latest office weight)                                                         Attempted call to patient and unable to reach.  Left message to return call.  Transmission reviewed.    Thoracic impedance abnormal suggesting fluid accumulation since 10/03/2018.  Labs: 07/20/2018 Creatinine 1.03, BUN 17, Potassium 3.4, Sodium 143 07/17/2018 Creatinine 1.00, BUN 19, Potassium 4.4, Sodium 140, eGFR 79-67  04/20/2018 Creatinine 1.00, BUN 19, Potassium 3.7, Sodium 140  04/19/2018 Creatinine 1.00, BUN 16, Potassium 4.6, Sodium 138  04/13/2018 Creatinine 1.07, BUN 17, Potassium 4.0, Sodium 141  A complete set of results can be found in Results Review.  Recommendations: Unable to reach.  Follow-up plan: ICM clinic phone appointment on 10/25/2018 to recheck fluid levels. Defib office check scheduled 10/29/2018 with Dr. Rayann Heman.    Copy of ICM check sent to Dr. Rayann Heman and Dr Gwenlyn Found for review and recommendations if needed.   3 month ICM trend: 10/08/2018    1 Year ICM trend:       Rosalene Billings, RN 10/09/2018 10:17 AM

## 2018-10-09 NOTE — Telephone Encounter (Signed)
Remote ICM transmission received.  Attempted call to patient regarding ICM remote transmission and left message to return call   

## 2018-10-25 ENCOUNTER — Ambulatory Visit (INDEPENDENT_AMBULATORY_CARE_PROVIDER_SITE_OTHER): Payer: Medicare Other

## 2018-10-25 DIAGNOSIS — I428 Other cardiomyopathies: Secondary | ICD-10-CM

## 2018-10-25 DIAGNOSIS — Z95810 Presence of automatic (implantable) cardiac defibrillator: Secondary | ICD-10-CM

## 2018-10-26 NOTE — Progress Notes (Signed)
EPIC Encounter for ICM Monitoring  Patient Name: Jeffery Thomas is a 67 y.o. male Date: 10/26/2018 Primary Care Physican: Bernerd Limbo, MD Primary Cardiologist:Berry Electrophysiologist:Allred Last Weight:194lbs (latest office weight)  Transmission reviewed.   Thoracic impedance return to normal since last remote transmission.  Labs: 07/20/2018 Creatinine 1.03, BUN 17, Potassium 3.4, Sodium 143 07/17/2018 Creatinine 1.00, BUN 19, Potassium 4.4, Sodium 140, eGFR 79-67  04/20/2018 Creatinine 1.00, BUN 19, Potassium 3.7, Sodium 140  04/19/2018 Creatinine 1.00, BUN 16, Potassium 4.6, Sodium 138  04/13/2018 Creatinine 1.07, BUN 17, Potassium 4.0, Sodium 141  A complete set of results can be found in Results Review.  Recommendations:None  Follow-up plan: ICM clinic phone appointment on2/17/2020. Defib office check scheduled 11/23/2018 with Dr.Allred.   Copy of ICM check sent to Dr.Allred.  3 month ICM trend: 10/25/2018    1 Year ICM trend:       Rosalene Billings, RN 10/26/2018 3:21 PM

## 2018-10-29 ENCOUNTER — Encounter: Payer: Medicare Other | Admitting: Internal Medicine

## 2018-11-09 ENCOUNTER — Other Ambulatory Visit: Payer: Self-pay | Admitting: Family Medicine

## 2018-11-09 NOTE — Telephone Encounter (Signed)
Patient has not been seen by any providers associated with [facility].  PCP is listed Dr. Coletta Memos. / Patient should contact his PCP for refills. / Patient did see Dr. Luetta Nutting once, but that is not his PCP

## 2018-11-09 NOTE — Telephone Encounter (Signed)
Copied from [place] [id number]. Topic: Quick Communication - Rx Refill/Question >> Nov 09, 2018  9:19 AM Sheran Luz wrote: Medication:  montelukast (SINGULAIR) 10 MG tablet and omeprazole (PRILOSEC) 40 MG capsule   Patient is requesting a refill of these medications. Advised patient he needs appointment.   Preferred Pharmacy (with phone number or street name):[facility] at [place], [place] - [place] [phone] (Phone) [phone] (Fax)

## 2018-11-14 MED ORDER — MONTELUKAST SODIUM 10 MG PO TABS: 10.0000 mg | ORAL_TABLET | Freq: Every day | ORAL | 2 refills | Status: DC

## 2018-11-14 MED ORDER — OMEPRAZOLE 40 MG PO CPDR: 40.0000 mg | DELAYED_RELEASE_CAPSULE | Freq: Every day | ORAL | 2 refills | Status: DC

## 2018-11-14 NOTE — Addendum Note (Signed)
Addended by: Valli Glance F on: 11/14/2018 10:31 AM   Modules accepted: Orders

## 2018-11-14 NOTE — Telephone Encounter (Signed)
Requested medication (s) are due for refill today -yes  Requested medication (s) are on the active medication list -yes  Future visit scheduled -no  Last refill: singular- historical                 Omeprazole- written 01/31/18 historical  Notes to clinic: patient is requesting PCP write these medications they are listed as historical on list- sent for provider review.  Requested Prescriptions  Pending Prescriptions Disp Refills   montelukast (SINGULAIR) 10 MG tablet      Sig: Take 1 tablet (10 mg total) by mouth daily.     Pulmonology:  Leukotriene Inhibitors Passed - 11/14/2018 10:31 AM      Passed - Valid encounter within last 12 months    Recent Outpatient Visits          3 months ago Acute left-sided low back pain without sciatica   [facility] [place] Matthews, [place], DO   4 months ago Need for pneumococcal vaccination   [facility] [place] [facility], [place], DO            omeprazole (PRILOSEC) 40 MG capsule      Sig: Take 1 capsule (40 mg total) by mouth daily.     Gastroenterology: Proton Pump Inhibitors Passed - 11/14/2018 10:31 AM      Passed - Valid encounter within last 12 months    Recent Outpatient Visits          3 months ago Acute left-sided low back pain without sciatica   [facility] [place] [facility], [place], DO   4 months ago Need for pneumococcal vaccination   [facility] [place] [facility], [place], [place]           Refused Prescriptions Disp Refills   montelukast (SINGULAIR) 10 MG tablet      Sig: Take 1 tablet (10 mg total) by mouth daily.     Pulmonology:  Leukotriene Inhibitors Passed - 11/14/2018 10:31 AM      Passed - Valid encounter within last 12 months    Recent Outpatient Visits          3 months ago Acute left-sided low back pain without sciatica   [facility] [place] [facility], [place], DO   4 months ago Need for pneumococcal vaccination   [facility] [place] [facility],  [place], DO            omeprazole (PRILOSEC) 40 MG capsule      Sig: Take 1 capsule (40 mg total) by mouth daily.     Gastroenterology: Proton Pump Inhibitors Passed - 11/14/2018 10:31 AM      Passed - Valid encounter within last 12 months    Recent Outpatient Visits          3 months ago Acute left-sided low back pain without sciatica   [facility] [place] [facility], [place], DO   4 months ago Need for pneumococcal vaccination   [facility] [place] [facility], [place], [place]              Requested Prescriptions  Pending Prescriptions Disp Refills   montelukast (SINGULAIR) 10 MG tablet      Sig: Take 1 tablet (10 mg total) by mouth daily.     Pulmonology:  Leukotriene Inhibitors Passed - 11/14/2018 10:31 AM      Passed - Valid encounter within last 12 months    Recent Outpatient Visits          3 months ago Acute  left-sided low back pain without sciatica   [facility] [place] Matthews, [place], DO   4 months ago Need for pneumococcal vaccination   [facility] [place] [facility], [place], DO            omeprazole (PRILOSEC) 40 MG capsule      Sig: Take 1 capsule (40 mg total) by mouth daily.     Gastroenterology: Proton Pump Inhibitors Passed - 11/14/2018 10:31 AM      Passed - Valid encounter within last 12 months    Recent Outpatient Visits          3 months ago Acute left-sided low back pain without sciatica   [facility] [place] [facility], [place], DO   4 months ago Need for pneumococcal vaccination   [facility] [place] [facility], [place], [place]           Refused Prescriptions Disp Refills   montelukast (SINGULAIR) 10 MG tablet      Sig: Take 1 tablet (10 mg total) by mouth daily.     Pulmonology:  Leukotriene Inhibitors Passed - 11/14/2018 10:31 AM      Passed - Valid encounter within last 12 months    Recent Outpatient Visits          3 months ago Acute left-sided low back pain without sciatica    [facility] [place] Matthews, [place], DO   4 months ago Need for pneumococcal vaccination   [facility] [place] [facility], [place], DO            omeprazole (PRILOSEC) 40 MG capsule      Sig: Take 1 capsule (40 mg total) by mouth daily.     Gastroenterology: Proton Pump Inhibitors Passed - 11/14/2018 10:31 AM      Passed - Valid encounter within last 12 months    Recent Outpatient Visits          3 months ago Acute left-sided low back pain without sciatica   [facility] [place] Matthews, [place], DO   4 months ago Need for pneumococcal vaccination   [facility] [place], [place], [place]

## 2018-11-14 NOTE — Addendum Note (Signed)
Addended by: Verlene Mayer A on: 11/14/2018 11:11 AM   Modules accepted: Orders

## 2018-11-14 NOTE — Telephone Encounter (Signed)
Pt checking status on his med refill. He is an established pt with Dr. Zigmund Daniel. Had his new pt appt on 07/17/18. Pt states he is out of medication and needs the refill asap if possible.

## 2018-11-23 ENCOUNTER — Encounter: Payer: Self-pay | Admitting: Internal Medicine

## 2018-11-23 ENCOUNTER — Ambulatory Visit (INDEPENDENT_AMBULATORY_CARE_PROVIDER_SITE_OTHER): Payer: Medicare Other | Admitting: Internal Medicine

## 2018-11-23 VITALS — BP 146/88 | HR 52 | Ht 71.0 in | Wt 208.0 lb

## 2018-11-23 DIAGNOSIS — R001 Bradycardia, unspecified: Secondary | ICD-10-CM

## 2018-11-23 DIAGNOSIS — I10 Essential (primary) hypertension: Secondary | ICD-10-CM | POA: Diagnosis not present

## 2018-11-23 DIAGNOSIS — I428 Other cardiomyopathies: Secondary | ICD-10-CM | POA: Diagnosis not present

## 2018-11-23 DIAGNOSIS — Z95810 Presence of automatic (implantable) cardiac defibrillator: Secondary | ICD-10-CM

## 2018-11-23 DIAGNOSIS — I4901 Ventricular fibrillation: Secondary | ICD-10-CM

## 2018-11-23 NOTE — Patient Instructions (Addendum)
Medication Instructions:  Your physician recommends that you continue on your current medications as directed. Please refer to the Current Medication list given to you today.  Labwork: None ordered.  Testing/Procedures: None ordered.  Follow-Up: Your physician wants you to follow-up in: one year with Chanetta Marshall NP.   You will receive a reminder letter in the mail two months in advance. If you don't receive a letter, please call our office to schedule the follow-up appointment.  Remote monitoring is used to monitor your ICD from home. This monitoring reduces the number of office visits required to check your device to one time per year. It allows [place] to keep an eye on the functioning of your device to ensure it is working properly. You are scheduled for a device check from home on 12/24/2018. You may send your transmission at any time that day. If you have a wireless device, the transmission will be sent automatically. After your physician reviews your transmission, you will receive a postcard with your next transmission date.  Any Other Special Instructions Will Be Listed Below (If Applicable).  If you need a refill on your cardiac medications before your next appointment, please call your pharmacy.

## 2018-11-23 NOTE — Progress Notes (Signed)
PCP: Luetta Nutting, DO Primary Cardiologist: Dr Gwenlyn Found Primary EP: Dr Janace Litten is a 68 y.o. male who presents today for routine electrophysiology followup.  He had premature ICD battery depletion and underwent urgent generator change by Dr Lovena Le 07/22/18.  Since last being seen in our clinic, the patient reports doing very well.  Today, he denies symptoms of palpitations, chest pain, shortness of breath,  lower extremity edema, dizziness, presyncope, syncope, or ICD shocks.  The patient is otherwise without complaint today.  He is exercising regularly and without complaint.  Past Medical History:  Diagnosis Date  . Abdominal distention   . Acute encephalopathy   . Acute respiratory failure with hypoxia ([place])   . Acute systolic congestive heart failure ([place])   . Agitation   . 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 Benign essential HTN   . Bradycardia   . Cardiac device in situ   . Encounter for central line placement   . Encounter for imaging study to confirm orogastric (OG) tube placement   . Gastroesophageal reflux disease   . HCAP (healthcare-associated pneumonia)   . History of cardiac arrest   . Hypertension   . Leukocytosis   . Prediabetes   . ST elevation myocardial infarction (STEMI) ([place])   . Streptococcal sore throat   . 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 Takatsuki syndrome   . Ventricular fibrillation ([place]) Jul 02, 2017   Past Surgical History:  Procedure Laterality Date  . ICD GENERATOR CHANGEOUT N/A 07/22/2018   Procedure: [place];  Surgeon: Evans Lance, MD;  Location: [facility];  Service: Cardiovascular;  Laterality: N/A;  . ICD IMPLANT N/A 07/04/2017   SJM Fortify Assura VR ICD implanted by Dr Rayann Heman for secondary prevention after VF arrest  . LEFT HEART CATH AND CORONARY ANGIOGRAPHY N/A July 02, 2017   Procedure: LEFT HEART CATH AND CORONARY ANGIOGRAPHY;  Surgeon:  Lorretta Harp, MD;  Location: [facility];  Service: Cardiovascular;  Laterality: N/A;  . MENISCUS REPAIR  1983  . TOTAL KNEE REVISION Left 04/18/2018   Procedure: LEFT TOTAL KNEE REVISION;  Surgeon: Gaynelle Arabian, MD;  Location: [facility];  Service: Orthopedics;  Laterality: Left;  Adductor Block    ROS- all systems are reviewed and negative except as per HPI above  Current Outpatient Medications  Medication Sig Dispense Refill  . atorvastatin (LIPITOR) 20 MG tablet Take 20 mg by mouth daily.    . carvedilol (COREG) 25 MG tablet Take 0.5 tablets (12.5 mg total) by mouth 2 (two) times daily. 90 tablet 3  . ibuprofen (ADVIL,MOTRIN) 200 MG tablet Take 400 mg by mouth every 6 (six) hours as needed (pain).    Mar[place] Kitchen losartan (COZAAR) 100 MG tablet Take 1 tablet (100 mg total) by mouth daily. 90 tablet 3  . montelukast (SINGULAIR) 10 MG tablet Take 1 tablet (10 mg total) by mouth daily. 90 tablet 2  . omeprazole (PRILOSEC) 40 MG capsule Take 1 capsule (40 mg total) by mouth daily. 90 capsule 2  . spironolactone (ALDACTONE) 25 MG tablet Take 1 tablet (25 mg total) by mouth daily. 30 tablet 6   No current facility-administered medications for this visit.     Physical Exam: Vitals:   11/23/18 1530  BP: (!) 146/88  Pulse: (!) 52  SpO2: 98%  Weight: 208 lb (94.3 kg)  Height: 5\' 11"  (1.803 m)    GEN- The patient is well appearing,  alert and oriented x 3 today.   Head- normocephalic, atraumatic Eyes-  Sclera clear, conjunctiva pink Ears- hearing intact Oropharynx- clear Lungs- Clear to ausculation bilaterally, normal work of breathing Chest- ICD pocket is well healed Heart- Regular rate and rhythm, no murmurs, rubs or gallops, PMI not laterally displaced GI- soft, NT, ND, + BS Extremities- no clubbing, cyanosis, or edema  ICD interrogation- reviewed in detail today,  See PACEART report  ekg tracing ordered today is personally reviewed and shows sinus rhythm 52 bpm, PR 148 msec, QRS  90 msec, Qtc 405 msec  Wt Readings from Last 3 Encounters:  11/23/18 208 lb (94.3 kg)  08/07/18 194 lb (88 kg)  07/22/18 195 lb 9.6 oz (88.7 kg)    Assessment and Plan:  1.  Chronic systolic dysfunction EF has recovered  2. VF arrest Normal ICD function  3. HTN Stable  No change required today  [place] Follow-up with Dr Gwenlyn Found as scheduled Return to see EP NP in a year  Thompson Grayer MD, [facility] 11/23/2018 3:53 PM

## 2018-11-26 LAB — CUP PACEART INCLINIC DEVICE CHECK
HIGH POWER IMPEDANCE MEASURED VALUE: 67.5 Ohm
Implantable Lead Location: 753860
Implantable Pulse Generator Implant Date: 20190915
Lead Channel Impedance Value: 450 Ohm
Lead Channel Pacing Threshold Amplitude: 0.5 V
Lead Channel Pacing Threshold Pulse Width: 0.5 ms
Lead Channel Setting Pacing Amplitude: 2.5 V
Lead Channel Setting Pacing Pulse Width: 0.5 ms
Lead Channel Setting Sensing Sensitivity: 0.5 mV
MDC IDC LEAD IMPLANT DT: 20180828
MDC IDC MSMT BATTERY REMAINING LONGEVITY: 103 mo
MDC IDC MSMT LEADCHNL RV PACING THRESHOLD AMPLITUDE: 0.5 V
MDC IDC MSMT LEADCHNL RV PACING THRESHOLD PULSEWIDTH: 0.5 ms
MDC IDC MSMT LEADCHNL RV SENSING INTR AMPL: 11.9 mV
MDC IDC SESS DTM: 20200117213950
MDC IDC STAT BRADY RV PERCENT PACED: 0.02 %
Pulse Gen Serial Number: 9851932

## 2018-12-12 ENCOUNTER — Ambulatory Visit (INDEPENDENT_AMBULATORY_CARE_PROVIDER_SITE_OTHER): Payer: Medicare Other | Admitting: Family Medicine

## 2018-12-12 ENCOUNTER — Encounter: Payer: Self-pay | Admitting: Family Medicine

## 2018-12-12 VITALS — BP 138/76 | HR 62 | Temp 97.9°F | Ht 71.0 in | Wt 201.0 lb

## 2018-12-12 DIAGNOSIS — N5089 Other specified disorders of the male genital organs: Secondary | ICD-10-CM | POA: Diagnosis not present

## 2018-12-12 NOTE — Progress Notes (Signed)
Jeffery Thomas - 68 y.o. male MRN [id number]  Date of birth: 12/01/1950  Subjective Chief Complaint  Patient presents with  . Groin Swelling    has been ongoing for six months-has improved some-denies pain or urinary issues.    HPI Jeffery Thomas is a 68 y.o. male with history of CAD and CHF here today with complaint of scrotal swelling. He reports that he has noticed symptoms over the past 6 months.  He denies pain associated with this but is concerned by the amount of swelling.  He hasn't had any difficulty with urination or pain with intercourse.  Bowels are moving normally and he denies fever, increased shortness of breath, or chest pain.    ROS:  A comprehensive ROS was completed and negative except as noted per HPI  No Known Allergies  Past Medical History:  Diagnosis Date  . Abdominal distention   . Acute encephalopathy   . Acute respiratory failure with hypoxia ([place])   . Acute systolic congestive heart failure ([place])   . Agitation   . Anoxic-ischemic encephalopathy ([place]) 07/05/2017  . Benign essential HTN   . Bradycardia   . Cardiac device in situ   . Encounter for central line placement   . Encounter for imaging study to confirm orogastric (OG) tube placement   . Gastroesophageal reflux disease   . HCAP (healthcare-associated pneumonia)   . History of cardiac arrest   . Hypertension   . Leukocytosis   . Prediabetes   . ST elevation myocardial infarction (STEMI) ([place])   . Streptococcal sore throat   . Sudden cardiac death [facility]) 2017-07-12   Sudden cardiac death/Takatsubo  syndrome  . Takatsuki syndrome   . Ventricular fibrillation ([place]) 07-12-2017    Past Surgical History:  Procedure Laterality Date  . ICD GENERATOR CHANGEOUT N/A 07/22/2018   Procedure: [place];  Surgeon: Evans Lance, MD;  Location: [place] [facility];  Service: Cardiovascular;  Laterality: N/A;  . ICD IMPLANT N/A 07/04/2017   SJM Fortify Assura VR ICD implanted by Dr Rayann Heman  for secondary prevention after VF arrest  . LEFT HEART CATH AND CORONARY ANGIOGRAPHY N/A 07/12/2017   Procedure: LEFT HEART CATH AND CORONARY ANGIOGRAPHY;  Surgeon: Lorretta Harp, MD;  Location: [place] [facility];  Service: Cardiovascular;  Laterality: N/A;  . MENISCUS REPAIR  1983  . TOTAL KNEE REVISION Left 04/18/2018   Procedure: LEFT TOTAL KNEE REVISION;  Surgeon: Gaynelle Arabian, MD;  Location: [facility];  Service: Orthopedics;  Laterality: Left;  Adductor Block    Social History   Socioeconomic History  . Marital status: Married    Spouse name: Not on file  . Number of children: Not on file  . Years of education: Not on file  . Highest education level: Not on file  Occupational History  . Not on file  Social Needs  . Financial resource strain: Not on file  . Food insecurity:    Worry: Not on file    Inability: Not on file  . Transportation needs:    Medical: Not on file    Non-medical: Not on file  Tobacco Use  . Smoking status: Former Smoker    Types: Cigars    Last attempt to quit: 12-Jul-2017    Years since quitting: 1.4  . Smokeless tobacco: Never Used  Substance and Sexual Activity  . Alcohol use: Not Currently  . Drug use: Never  . Sexual activity: Not on file  Lifestyle  . Physical  activity:    Days per week: Not on file    Minutes per session: Not on file  . Stress: Not on file  Relationships  . Social connections:    Talks on phone: Not on file    Gets together: Not on file    Attends religious service: Not on file    Active member of club or organization: Not on file    Attends meetings of clubs or organizations: Not on file    Relationship status: Not on file  Other Topics Concern  . Not on file  Social History Narrative  . Not on file    Family History  Problem Relation Age of Onset  . Hypertension Mother   . Diabetes Father   . Cancer Brother        pancreatic    Health Maintenance  Topic Date Due  . Hepatitis C Screening  Apr 09, 1951    . COLONOSCOPY  05/22/2003  . INFLUENZA VACCINE  03/06/2019 (Originally 06/07/2018)  . TETANUS/TDAP  07/18/2019 (Originally 05/21/1972)  . PNA vac Low Risk Adult (2 of 2 - PPSV23) 07/18/2019  . HIV Screening  Completed    ----------------------------------------------------------------------------------------------------------------------------------------------------------------------------------------------------------------- Physical Exam BP 138/76   Pulse 62   Temp 97.9 F (36.6 C) (Oral)   Ht 5\' 11"  (1.803 m)   Wt 201 lb (91.2 kg)   SpO2 98%   BMI 28.03 kg/m   Physical Exam Constitutional:      Appearance: Normal appearance.  HENT:     Head: Normocephalic and atraumatic.     Mouth/Throat:     Mouth: Mucous membranes are moist.  Neck:     Musculoskeletal: Neck supple.  Cardiovascular:     Rate and Rhythm: Normal rate and regular rhythm.  Pulmonary:     Effort: Pulmonary effort is normal.     Breath sounds: Normal breath sounds.  Abdominal:     General: Abdomen is flat.     Palpations: Abdomen is soft.  Genitourinary:    Comments: Edematous scrotum, R testicle difficult to palpate due to swelling.  L testicle palpable without obvious mass or tenderness. Skin:    Findings: No rash.  Neurological:     Mental Status: He is alert.     ------------------------------------------------------------------------------------------------------------------------------------------------------------------------------------------------------------------- Assessment and Plan  Scrotal swelling -Weight has been stable, does not appear to be retaining any significant fluid elsewhere.  Given that R testicle is difficult to palpate will obtain US for scrotum for further evaluation.

## 2018-12-12 NOTE — Assessment & Plan Note (Signed)
-  Weight has been stable, does not appear to be retaining any significant fluid elsewhere.  Given that R testicle is difficult to palpate will obtain US for scrotum for further evaluation.

## 2018-12-18 ENCOUNTER — Ambulatory Visit
Admission: RE | Admit: 2018-12-18 | Discharge: 2018-12-18 | Disposition: A | Discharge status: Home / Self Care | Payer: Medicare Other | Source: Ambulatory Visit | Attending: Family Medicine | Admitting: Family Medicine

## 2018-12-18 DIAGNOSIS — N5089 Other specified disorders of the male genital organs: Secondary | ICD-10-CM

## 2018-12-19 ENCOUNTER — Other Ambulatory Visit: Payer: Self-pay | Admitting: Family Medicine

## 2018-12-19 DIAGNOSIS — N433 Hydrocele, unspecified: Secondary | ICD-10-CM

## 2018-12-19 NOTE — Progress Notes (Signed)
Large hydrocele (collection of fluid in sheath surrounding testicle) noted on the L.  This may continue to improve some.  If becomes more bothersome we can arrange for him to see a urologist.

## 2018-12-24 ENCOUNTER — Ambulatory Visit (INDEPENDENT_AMBULATORY_CARE_PROVIDER_SITE_OTHER): Payer: Medicare Other

## 2018-12-24 ENCOUNTER — Telehealth: Payer: Self-pay

## 2018-12-24 DIAGNOSIS — I4901 Ventricular fibrillation: Secondary | ICD-10-CM

## 2018-12-24 DIAGNOSIS — I428 Other cardiomyopathies: Secondary | ICD-10-CM | POA: Diagnosis not present

## 2018-12-24 DIAGNOSIS — Z95810 Presence of automatic (implantable) cardiac defibrillator: Secondary | ICD-10-CM

## 2018-12-24 LAB — CUP PACEART REMOTE DEVICE CHECK
Battery Remaining Longevity: 101 mo
Battery Remaining Percentage: 95.5 %
Battery Voltage: 3.2 V
Brady Statistic RV Percent Paced: 1 %
Date Time Interrogation Session: 20200217090034
HIGH POWER IMPEDANCE MEASURED VALUE: 69 Ohm
HIGH POWER IMPEDANCE MEASURED VALUE: 69 Ohm
Implantable Lead Implant Date: 20180828
Lead Channel Impedance Value: 460 Ohm
Lead Channel Pacing Threshold Amplitude: 0.5 V
Lead Channel Sensing Intrinsic Amplitude: 11.9 mV
MDC IDC LEAD LOCATION: 753860
MDC IDC MSMT LEADCHNL RV PACING THRESHOLD PULSEWIDTH: 0.5 ms
MDC IDC PG IMPLANT DT: 20190915
MDC IDC SET LEADCHNL RV PACING AMPLITUDE: 2.5 V
MDC IDC SET LEADCHNL RV PACING PULSEWIDTH: 0.5 ms
MDC IDC SET LEADCHNL RV SENSING SENSITIVITY: 0.5 mV
Pulse Gen Serial Number: 9851932

## 2018-12-24 NOTE — Progress Notes (Signed)
EPIC Encounter for ICM Monitoring  Patient Name: Jeffery Thomas is a 68 y.o. male Date: 12/24/2018 Primary Care Physican: Luetta Nutting, DO Primary Cardiologist:Berry Electrophysiologist:Allred Last Weight:208lbs (latest office weight)  Attempted call to patient and unable to reach.Left message to return call.Transmission reviewed.   Thoracic impedance normal.  Labs: 07/20/2018 Creatinine 1.03, BUN 17, Potassium 3.4, Sodium 143 07/17/2018 Creatinine 1.00, BUN 19, Potassium 4.4, Sodium 140, eGFR 79-67  04/20/2018 Creatinine 1.00, BUN 19, Potassium 3.7, Sodium 140  04/19/2018 Creatinine 1.00, BUN 16, Potassium 4.6, Sodium 138  04/13/2018 Creatinine 1.07, BUN 17, Potassium 4.0, Sodium 141  A complete set of results can be found in Results Review.  Recommendations:Unable to reach.  Follow-up plan: ICM clinic phone appointment on3/23/2020  Copy of ICM check sent to Dr.Allred   3 month ICM trend: 12/24/2018    1 Year ICM trend:       Rosalene Billings, RN 12/24/2018 3:13 PM

## 2018-12-24 NOTE — Telephone Encounter (Signed)
Remote ICM transmission received.  Attempted call to patient regarding ICM remote transmission and left detailed message, per DPR, with next ICM remote transmission date of 01/28/2019.  Advised to return call for any fluid symptoms or questions.    

## 2019-01-02 NOTE — Progress Notes (Signed)
Remote ICD transmission.   

## 2019-01-07 ENCOUNTER — Other Ambulatory Visit: Payer: Self-pay | Admitting: General Surgery

## 2019-01-08 ENCOUNTER — Telehealth: Payer: Self-pay | Admitting: *Deleted

## 2019-01-08 NOTE — Telephone Encounter (Signed)
   [facility] Pre-operative Risk Assessment    Request for surgical clearance:  1. What type of surgery is being performed? Open repair of left inguinal hernia repair with mesh and hydrocele repair   2. When is this surgery scheduled? TBD   3. What type of clearance is required (medical clearance vs. Pharmacy clearance to hold med vs. Both)?ASPIRIN  4. Are there any medications that need to be held prior to surgery and how long?they are asking for direction  5. Practice name and name of physician performing surgery? Dr Elder Negus   6. What is your office phone number [phone]    7.   What is your office fax number [phone] attn jacqueline haggett, rma  8.   Anesthesia type (None, local, MAC, general) ? general   Jeffery Thomas 01/08/2019, 11:22 AM  _________________________________________________________________   (provider comments below)

## 2019-01-08 NOTE — Telephone Encounter (Signed)
   Primary Cardiologist: Quay Burow, MD  Chart reviewed as part of pre-operative protocol coverage. Patient was contacted 01/08/2019 in reference to pre-operative risk assessment for pending surgery as outlined below.  Jeffery Thomas was last seen on 11/23/2018 by Dr. Rayann Heman.  Since that day, Jeffery Thomas has done well. He says he has a new lease on life. He is working out at [employer] 6 days per week (he is actually there now) and says he has lots of energy. He denies chest pain, shortness of breath, dizziness or syncope. He has hx of sudden cardiac death with finding of Takotsubo cardiomyopathy. Cardiac cath showed no obstructive CAD. His heart function normalized. He has an ICD with last generator change on 07/22/18.   Therefore, based on ACC/AHA guidelines, the patient would be at acceptable risk for the planned procedure without further cardiovascular testing.   ICD precautions will need to be observed with surgery.   The patient does not have aspirin on his med list. He tells me that he only takes it occasionally. Therefore, it is OK to hold aspirin for surgery as needed.   I will route this recommendation to the requesting party via Epic fax function and remove from pre-op pool.  Please call with questions.  Daune Perch, NP 01/08/2019, 12:00 PM

## 2019-01-21 ENCOUNTER — Other Ambulatory Visit: Payer: Self-pay

## 2019-01-21 DIAGNOSIS — K219 Gastro-esophageal reflux disease without esophagitis: Secondary | ICD-10-CM

## 2019-01-21 MED ORDER — OMEPRAZOLE 40 MG PO CPDR: 40.0000 mg | DELAYED_RELEASE_CAPSULE | Freq: Every day | ORAL | 2 refills | Status: DC

## 2019-01-21 NOTE — Telephone Encounter (Signed)
Rx request refill via fax from [place]. LOV was 12/12/2018

## 2019-01-28 ENCOUNTER — Other Ambulatory Visit: Payer: Self-pay

## 2019-01-28 ENCOUNTER — Ambulatory Visit (INDEPENDENT_AMBULATORY_CARE_PROVIDER_SITE_OTHER): Payer: Medicare Other

## 2019-01-28 DIAGNOSIS — Z95810 Presence of automatic (implantable) cardiac defibrillator: Secondary | ICD-10-CM

## 2019-01-28 DIAGNOSIS — I428 Other cardiomyopathies: Secondary | ICD-10-CM

## 2019-01-30 NOTE — Progress Notes (Signed)
EPIC Encounter for ICM Monitoring  Patient Name: Jeffery Thomas is a 68 y.o. male Date: 01/30/2019 Primary Care Physican: Luetta Nutting, DO Primary Cardiologist:Berry Electrophysiologist:Allred 2/05/2020Weight:201lbs - office weight  Transmission reviewed.   Thoracic impedancenormal.  Labs: 07/20/2018 Creatinine1.03, BUN17, Potassium3.4, Sodium143 07/17/2018 Creatinine1.00, BUN19, Potassium4.4, [id number], [id number]  04/20/2018 Creatinine1.00, BUN19, Potassium3.7, Sodium140  06/13/2019Creatinine 1.00, BUN16, Potassium4.6, Sodium138  06/07/2019Creatinine 1.07, BUN17, Potassium4.0, Sodium141 A complete set of results can be found in Results Review.  Recommendations:None  Follow-up plan: ICM clinic phone appointment on4/27/2020  Copy of ICM check sent to Dr.Allred  3 month ICM trend: 01/28/2019    1 Year ICM trend:       Rosalene Billings, RN 01/30/2019 12:09 PM

## 2019-02-16 ENCOUNTER — Other Ambulatory Visit: Payer: Self-pay | Admitting: Cardiovascular Disease

## 2019-03-04 ENCOUNTER — Other Ambulatory Visit: Payer: Self-pay

## 2019-03-04 ENCOUNTER — Ambulatory Visit (INDEPENDENT_AMBULATORY_CARE_PROVIDER_SITE_OTHER): Payer: Medicare Other

## 2019-03-04 DIAGNOSIS — I428 Other cardiomyopathies: Secondary | ICD-10-CM

## 2019-03-04 DIAGNOSIS — Z95810 Presence of automatic (implantable) cardiac defibrillator: Secondary | ICD-10-CM

## 2019-03-05 NOTE — Progress Notes (Signed)
EPIC Encounter for ICM Monitoring  Patient Name: Jeffery Thomas is a 68 y.o. male Date: 03/05/2019 Primary Care Physican: Luetta Nutting, DO Primary Cardiologist:Berry Electrophysiologist:Allred LastWeight:201lbs - office weight  Transmission reviewed.   [place] Thoracic impedancenormal.  Labs: 07/20/2018 Creatinine1.03, BUN17, Potassium3.4, [id number] 07/17/2018 Creatinine1.00, BUN19, Potassium4.4, [id number], [id number]  04/20/2018 Creatinine1.00, BUN19, Potassium3.7, Sodium140  06/13/2019Creatinine 1.00, BUN16, Potassium4.6, Sodium138  06/07/2019Creatinine 1.07, BUN17, Potassium4.0, Sodium141 A complete set of results can be found in Results Review.  Recommendations:None  Follow-up plan: ICM clinic phone appointment on6/11/2018  Copy of ICM check sent to Dr.Allred  3 month ICM trend: 03/04/2019    1 Year ICM trend:       Rosalene Billings, RN 03/05/2019 10:36 AM

## 2019-03-13 ENCOUNTER — Other Ambulatory Visit: Payer: Self-pay | Admitting: Urology

## 2019-03-14 ENCOUNTER — Other Ambulatory Visit: Payer: Self-pay | Admitting: Urology

## 2019-03-25 ENCOUNTER — Other Ambulatory Visit: Payer: Self-pay

## 2019-03-25 ENCOUNTER — Encounter: Payer: Medicare Other | Admitting: *Deleted

## 2019-03-26 ENCOUNTER — Encounter (HOSPITAL_BASED_OUTPATIENT_CLINIC_OR_DEPARTMENT_OTHER): Payer: Self-pay | Admitting: *Deleted

## 2019-03-26 ENCOUNTER — Telehealth: Payer: Self-pay

## 2019-03-26 ENCOUNTER — Other Ambulatory Visit: Payer: Self-pay

## 2019-03-26 NOTE — Progress Notes (Signed)
SPOKE W/  _ pt via phone     SCREENING SYMPTOMS OF COVID 19:   COUGH-- no  RUNNY NOSE---  no  SORE THROAT--- no  NASAL CONGESTION---- no  SNEEZING---- no  SHORTNESS OF BREATH--- no  DIFFICULTY BREATHING--- no  TEMP >100.0 ----- no  UNEXPLAINED BODY ACHES------ no  CHILLS -------- no  HEADACHES --------- no  LOSS OF SMELL/ TASTE -------- no   HAVE YOU OR ANY FAMILY MEMBER TRAVELLED PAST 14 DAYS OUT OF THE   COUNTY--- no STATE----  no COUNTRY----  no  HAVE YOU OR ANY FAMILY MEMBER BEEN EXPOSED TO ANYONE WITH COVID 19?    Denies.

## 2019-03-26 NOTE — Telephone Encounter (Signed)
Left message for patient to remind of missed remote transmission.  

## 2019-03-26 NOTE — Progress Notes (Addendum)
Spoke w/ pt via phone for pre-op interview.  Npo after mn.  Arrive at 0530.  Needs istat 8.  Current ekg in chart and epic. Will take coreg, lipitor, and prilosec am dos w/ sips of water. Faxed ICD device orders to dr allred office.  Telephone cardiac clearance , dr berry, dated 01-08-2019 in epic and chart.  Dr Jerrilyn Cairo note dated 11-23-2018 and ICD check dated 03-04-2019 in epic.and chart.  Anesthesia reviewed chart, Konrad Felix PA, ok to proceed.

## 2019-03-26 NOTE — Anesthesia Preprocedure Evaluation (Addendum)
Anesthesia Evaluation  Patient identified by MRN, date of birth, ID band Patient awake    Reviewed: Allergy & Precautions, NPO status , Patient's Chart, lab work & pertinent test results, reviewed documented beta blocker date and time   Airway Mallampati: II  TM Distance: >3 FB Neck ROM: Full    Dental  (+) Dental Advisory Given   Pulmonary sleep apnea , former smoker,    Pulmonary exam normal breath sounds clear to auscultation       Cardiovascular hypertension, Pt. on medications and Pt. on home beta blockers + Past MI and +CHF  Normal cardiovascular exam+ Cardiac Defibrillator  Rhythm:Regular Rate:Normal  Echo 2018 - Left ventricle: The cavity size was normal. Wall thickness was normal. Systolic function was normal. The estimated ejection fraction was in the range of 60% to 65%. - Aorta: Ascending aorta is mildly dilated at 40 mm.    Neuro/Psych negative neurological ROS  negative psych ROS   GI/Hepatic Neg liver ROS, GERD  ,  Endo/Other  negative endocrine ROS  Renal/GU negative Renal ROS     Musculoskeletal negative musculoskeletal ROS (+)   Abdominal (+) + obese,   Peds  Hematology negative hematology ROS (+)   Anesthesia Other Findings Day of surgery medications reviewed with the patient.  Reproductive/Obstetrics                                                            Anesthesia Evaluation  Patient identified by MRN, date of birth, ID band Patient awake    Reviewed: Allergy & Precautions, NPO status , Patient's Chart, lab work & pertinent test results  Airway Mallampati: II  TM Distance: >3 FB Neck ROM: Full    Dental   Pulmonary former smoker,    Pulmonary exam normal        Cardiovascular hypertension, Pt. on medications + Past MI  Normal cardiovascular exam+ Cardiac Defibrillator      Neuro/Psych    GI/Hepatic GERD  Medicated and Controlled,   Endo/Other    Renal/GU      Musculoskeletal   Abdominal   Peds  Hematology   Anesthesia Other Findings   Reproductive/Obstetrics                             Anesthesia Physical Anesthesia Plan  ASA: III  Anesthesia Plan: Spinal   Post-op Pain Management:  Regional for Post-op pain   Induction:   PONV Risk Score and Plan: 1 and Ondansetron  Airway Management Planned: Simple Face Mask  Additional Equipment:   Intra-op Plan:   Post-operative Plan:   Informed Consent: I have reviewed the patients History and Physical, chart, labs and discussed the procedure including the risks, benefits and alternatives for the proposed anesthesia with the patient or authorized representative who has indicated his/her understanding and acceptance.     Plan Discussed with: CRNA and Surgeon  Anesthesia Plan Comments:         Anesthesia Quick Evaluation  Anesthesia Physical Anesthesia Plan  ASA: III  Anesthesia Plan: General   Post-op Pain Management:    Induction: Intravenous  PONV Risk Score and Plan: 3 and Ondansetron, Dexamethasone and Midazolam  Airway Management Planned: LMA  Additional Equipment: None  Intra-op Plan:   Post-operative  Plan: Extubation in OR  Informed Consent: I have reviewed the patients History and Physical, chart, labs and discussed the procedure including the risks, benefits and alternatives for the proposed anesthesia with the patient or authorized representative who has indicated his/her understanding and acceptance.     Dental advisory given  Plan Discussed with: CRNA  Anesthesia Plan Comments: (See PAT note 03/26/19, Konrad Felix, PA-C)       Anesthesia Quick Evaluation

## 2019-03-26 NOTE — Progress Notes (Signed)
Anesthesia Chart Review   Case:  [id number] Date/Time:  04/02/19 0730   Procedure:  HYDROCELECTOMY ADULT (Left )   Anesthesia type:  General   Pre-op diagnosis:  LEFT HYDROCELE   Location:  [facility] OR ROOM 1 / [place]   Surgeon:  Festus Aloe, MD      DISCUSSION: 68 yo former smoker (quit 07/10/17) with h/o HTN, STEMI, Chronic systolic dysfunction, AICD, GERD, pre-diabetes, left hydrocele scheduled for above procedure 04/02/19 with Dr. Festus Aloe.   Pt cleared by cardiology 01/08/2019.  Per Daune Perch, NP, "Jeffery Thomas was last seen on 11/23/2018 by Dr. Rayann Heman.  Since that day, Jeffery Thomas has done well. He says he has a new lease on life. He is working out at [employer] 6 days per week (he is actually there now) and says he has lots of energy. He denies chest pain, shortness of breath, dizziness or syncope. He has hx of sudden cardiac death with finding of Takotsubo cardiomyopathy. Cardiac cath showed no obstructive CAD. His heart function normalized. He has an ICD with last generator change on 07/22/18. Therefore, based on ACC/AHA guidelines, the patient would be at acceptable risk for the planned procedure without further cardiovascular testing. ICD precautions will need to be observed with surgery. The patient does not have aspirin on his med list. He tells me that he only takes it occasionally. Therefore, it is OK to hold aspirin for surgery as needed."  Last ICD transmission 03/04/2019.  ICD prescription requested, pending.   Pt can proceed with planned procedure barring acute status change.  VS: There were no vitals taken for this visit.  PROVIDERS: Luetta Nutting, DO is PCP   Thompson Grayer, MD is electrophysiologist   Quay Burow, MD is Cardiologist  LABS: Labs DOS (all labs ordered are listed, but only abnormal results are displayed)  Labs Reviewed - No data to display   IMAGES:   EKG: 11/23/2018 Rate 52 bpm Sinus bradycardia  Nonspecific  ST abnormality   CV: Echo 10/09/17 Study Conclusions  - Left ventricle: The cavity size was normal. Wall thickness was   normal. Systolic function was normal. The estimated ejection   fraction was in the range of 60% to 65%. - Aorta: Ascending aorta is mildly dilated at 40 mm.  Cardiac Cath 10-Jul-2017  There is severe left ventricular systolic dysfunction.  LV end diastolic pressure is mildly elevated.  The left ventricular ejection fraction is less than 25% by visual estimate. Past Medical History:  Diagnosis Date  . Abdominal distention   . Acute encephalopathy   . Acute respiratory failure with hypoxia ([place])   . Acute systolic congestive heart failure ([place])   . Agitation   . Anoxic-ischemic encephalopathy ([place]) 07/05/2017  . Benign essential HTN   . Bradycardia   . Cardiac device in situ   . Encounter for central line placement   . Encounter for imaging study to confirm orogastric (OG) tube placement   . Gastroesophageal reflux disease   . HCAP (healthcare-associated pneumonia)   . History of cardiac arrest   . Hypertension   . Leukocytosis   . Prediabetes   . ST elevation myocardial infarction (STEMI) ([place])   . Streptococcal sore throat   . Sudden cardiac death [facility]) 2017/07/10   Sudden cardiac death/Takatsubo  syndrome  . Takatsuki syndrome   . Ventricular fibrillation ([place]) 2017/07/10    Past Surgical History:  Procedure Laterality Date  . ICD GENERATOR CHANGEOUT N/A 07/22/2018  Procedure: ICD GENERATOR CHANGEOUT;  Surgeon: Evans Lance, MD;  Location: [place] [facility];  Service: Cardiovascular;  Laterality: N/A;  . ICD IMPLANT N/A 07/04/2017   SJM Fortify Assura VR ICD implanted by Dr Rayann Heman for secondary prevention after VF arrest  . LEFT HEART CATH AND CORONARY ANGIOGRAPHY N/A 06/26/2017   Procedure: LEFT HEART CATH AND CORONARY ANGIOGRAPHY;  Surgeon: Lorretta Harp, MD;  Location: [place] [facility];  Service: Cardiovascular;  Laterality: N/A;  .  MENISCUS REPAIR  1983  . TOTAL KNEE REVISION Left 04/18/2018   Procedure: LEFT TOTAL KNEE REVISION;  Surgeon: Gaynelle Arabian, MD;  Location: [facility];  Service: Orthopedics;  Laterality: Left;  Adductor Block    MEDICATIONS: No current facility-administered medications for this encounter.    Marland Kitchen atorvastatin (LIPITOR) 20 MG tablet  . carvedilol (COREG) 25 MG tablet  . ibuprofen (ADVIL,MOTRIN) 200 MG tablet  . losartan (COZAAR) 100 MG tablet  . montelukast (SINGULAIR) 10 MG tablet  . omeprazole (PRILOSEC) 40 MG capsule  . spironolactone (ALDACTONE) 25 MG tablet   Maia Plan [facility] Pre-Surgical Testing [phone] 03/26/19 11:06 AM

## 2019-03-29 ENCOUNTER — Other Ambulatory Visit (HOSPITAL_COMMUNITY)
Admission: RE | Admit: 2019-03-29 | Discharge: 2019-03-29 | Disposition: A | Discharge status: Home / Self Care | Payer: Medicare Other | Source: Ambulatory Visit | Attending: Urology | Admitting: Urology

## 2019-03-29 ENCOUNTER — Other Ambulatory Visit: Payer: Self-pay

## 2019-03-29 DIAGNOSIS — Z1159 Encounter for screening for other viral diseases: Secondary | ICD-10-CM | POA: Diagnosis present

## 2019-03-29 NOTE — Progress Notes (Signed)
SPOKE W/  _[name]     SCREENING SYMPTOMS OF COVID 19:   COUGH--no  RUNNY NOSE--no   SORE THROAT---no  NASAL CONGESTION----no  SNEEZING----no SHORTNESS OF BREATH---no  DIFFICULTY BREATHING---no  TEMP >100.0 -----no  UNEXPLAINED BODY ACHES------no  CHILLS -------- no  HEADACHES ---------no  LOSS OF SMELL/ TASTE --------no    HAVE YOU OR ANY FAMILY MEMBER TRAVELLED PAST 14 DAYS OUT OF THE   COUNTY---no STATE----no COUNTRY----no  HAVE YOU OR ANY FAMILY MEMBER BEEN EXPOSED TO ANYONE WITH COVID 19?   no

## 2019-03-30 LAB — NOVEL CORONAVIRUS, NAA (HOSP ORDER, SEND-OUT TO REF LAB; TAT 18-24 HRS): SARS-CoV-2, NAA: NOT DETECTED

## 2019-04-02 ENCOUNTER — Encounter (HOSPITAL_BASED_OUTPATIENT_CLINIC_OR_DEPARTMENT_OTHER)
Admission: RE | Disposition: A | Discharge status: Home / Self Care | Payer: Self-pay | Source: Home / Self Care | Attending: Urology

## 2019-04-02 ENCOUNTER — Ambulatory Visit (HOSPITAL_BASED_OUTPATIENT_CLINIC_OR_DEPARTMENT_OTHER): Payer: Medicare Other | Admitting: Physician Assistant

## 2019-04-02 ENCOUNTER — Encounter (HOSPITAL_BASED_OUTPATIENT_CLINIC_OR_DEPARTMENT_OTHER): Payer: Self-pay

## 2019-04-02 ENCOUNTER — Ambulatory Visit (HOSPITAL_BASED_OUTPATIENT_CLINIC_OR_DEPARTMENT_OTHER)
Admission: RE | Admit: 2019-04-02 | Discharge: 2019-04-02 | Disposition: A | Discharge status: Home / Self Care | Payer: Medicare Other | Attending: Urology | Admitting: Urology

## 2019-04-02 ENCOUNTER — Other Ambulatory Visit: Payer: Self-pay | Admitting: General Surgery

## 2019-04-02 DIAGNOSIS — Z79899 Other long term (current) drug therapy: Secondary | ICD-10-CM | POA: Diagnosis not present

## 2019-04-02 DIAGNOSIS — G4733 Obstructive sleep apnea (adult) (pediatric): Secondary | ICD-10-CM | POA: Insufficient documentation

## 2019-04-02 DIAGNOSIS — Z87891 Personal history of nicotine dependence: Secondary | ICD-10-CM | POA: Insufficient documentation

## 2019-04-02 DIAGNOSIS — R7303 Prediabetes: Secondary | ICD-10-CM | POA: Insufficient documentation

## 2019-04-02 DIAGNOSIS — K219 Gastro-esophageal reflux disease without esophagitis: Secondary | ICD-10-CM | POA: Diagnosis not present

## 2019-04-02 DIAGNOSIS — I252 Old myocardial infarction: Secondary | ICD-10-CM | POA: Insufficient documentation

## 2019-04-02 DIAGNOSIS — N433 Hydrocele, unspecified: Secondary | ICD-10-CM | POA: Diagnosis present

## 2019-04-02 DIAGNOSIS — Z6827 Body mass index (BMI) 27.0-27.9, adult: Secondary | ICD-10-CM | POA: Insufficient documentation

## 2019-04-02 DIAGNOSIS — E669 Obesity, unspecified: Secondary | ICD-10-CM | POA: Diagnosis not present

## 2019-04-02 DIAGNOSIS — Z95810 Presence of automatic (implantable) cardiac defibrillator: Secondary | ICD-10-CM | POA: Diagnosis not present

## 2019-04-02 DIAGNOSIS — I110 Hypertensive heart disease with heart failure: Secondary | ICD-10-CM | POA: Diagnosis not present

## 2019-04-02 DIAGNOSIS — I5022 Chronic systolic (congestive) heart failure: Secondary | ICD-10-CM | POA: Diagnosis not present

## 2019-04-02 DIAGNOSIS — Z1159 Encounter for screening for other viral diseases: Secondary | ICD-10-CM | POA: Insufficient documentation

## 2019-04-02 DIAGNOSIS — M314 Aortic arch syndrome [Takayasu]: Secondary | ICD-10-CM | POA: Insufficient documentation

## 2019-04-02 DIAGNOSIS — N430 Encysted hydrocele: Secondary | ICD-10-CM

## 2019-04-02 HISTORY — DX: Prediabetes: R73.03

## 2019-04-02 HISTORY — DX: Obstructive sleep apnea (adult) (pediatric): G47.33

## 2019-04-02 HISTORY — DX: Hydrocele, unspecified: N43.3

## 2019-04-02 HISTORY — DX: Chronic systolic (congestive) heart failure: I50.22

## 2019-04-02 HISTORY — DX: Alcohol abuse, in remission: F10.11

## 2019-04-02 HISTORY — DX: Other cardiomyopathies: I42.8

## 2019-04-02 HISTORY — PX: HYDROCELE EXCISION: SHX482

## 2019-04-02 LAB — POCT I-STAT, CHEM 8
BUN: 24 mg/dL — ABNORMAL HIGH (ref 8–23)
Calcium, Ion: 1.23 mmol/L (ref 1.15–1.40)
Chloride: 100 mmol/L (ref 98–111)
Creatinine, Ser: 1.1 mg/dL (ref 0.61–1.24)
Glucose, Bld: 119 mg/dL — ABNORMAL HIGH (ref 70–99)
HCT: 48 % (ref 39.0–52.0)
Hemoglobin: 16.3 g/dL (ref 13.0–17.0)
Potassium: 3.9 mmol/L (ref 3.5–5.1)
Sodium: 137 mmol/L (ref 135–145)
TCO2: 25 mmol/L (ref 22–32)

## 2019-04-02 SURGERY — HYDROCELECTOMY
Anesthesia: General | Laterality: Left

## 2019-04-02 MED ORDER — BUPIVACAINE-EPINEPHRINE 0.5% -1:200000 IJ SOLN
INTRAMUSCULAR | Status: DC | PRN
  Administered 2019-04-02: 2 mL

## 2019-04-02 MED ORDER — LIDOCAINE 2% (20 MG/ML) 5 ML SYRINGE
INTRAMUSCULAR | Status: AC
  Filled 2019-04-02: qty 5

## 2019-04-02 MED ORDER — DEXAMETHASONE SODIUM PHOSPHATE 10 MG/ML IJ SOLN
INTRAMUSCULAR | Status: AC
  Filled 2019-04-02: qty 1

## 2019-04-02 MED ORDER — PROPOFOL 10 MG/ML IV BOLUS
INTRAVENOUS | Status: DC | PRN
  Administered 2019-04-02: 150 mg via INTRAVENOUS

## 2019-04-02 MED ORDER — CEFAZOLIN SODIUM-DEXTROSE 2-4 GM/100ML-% IV SOLN
2.0000 g | INTRAVENOUS | Status: DC
  Filled 2019-04-02: qty 100

## 2019-04-02 MED ORDER — KETOROLAC TROMETHAMINE 30 MG/ML IJ SOLN
INTRAMUSCULAR | Status: AC
  Filled 2019-04-02: qty 1

## 2019-04-02 MED ORDER — CHLORHEXIDINE GLUCONATE CLOTH 2 % EX PADS
6.0000 | MEDICATED_PAD | Freq: Once | CUTANEOUS | Status: DC
  Filled 2019-04-02: qty 6

## 2019-04-02 MED ORDER — FENTANYL CITRATE (PF) 100 MCG/2ML IJ SOLN
INTRAMUSCULAR | Status: AC
  Filled 2019-04-02: qty 2

## 2019-04-02 MED ORDER — ACETAMINOPHEN 500 MG PO TABS
ORAL_TABLET | ORAL | Status: AC
  Filled 2019-04-02: qty 2

## 2019-04-02 MED ORDER — PROPOFOL 10 MG/ML IV BOLUS
INTRAVENOUS | Status: AC
  Filled 2019-04-02: qty 40

## 2019-04-02 MED ORDER — FENTANYL CITRATE (PF) 100 MCG/2ML IJ SOLN
25.0000 ug | INTRAMUSCULAR | Status: DC | PRN
  Filled 2019-04-02: qty 1

## 2019-04-02 MED ORDER — MEPERIDINE HCL 25 MG/ML IJ SOLN
6.2500 mg | INTRAMUSCULAR | Status: DC | PRN
  Filled 2019-04-02: qty 1

## 2019-04-02 MED ORDER — ACETAMINOPHEN 500 MG PO TABS
1000.0000 mg | ORAL_TABLET | ORAL | Status: DC
  Filled 2019-04-02: qty 2

## 2019-04-02 MED ORDER — LIDOCAINE 2% (20 MG/ML) 5 ML SYRINGE
INTRAMUSCULAR | Status: DC | PRN
  Administered 2019-04-02: 100 mg via INTRAVENOUS

## 2019-04-02 MED ORDER — LACTATED RINGERS IV SOLN
INTRAVENOUS | Status: DC
  Administered 2019-04-02: 06:00:00 via INTRAVENOUS
  Filled 2019-04-02: qty 1000

## 2019-04-02 MED ORDER — ONDANSETRON HCL 4 MG/2ML IJ SOLN
INTRAMUSCULAR | Status: DC | PRN
  Administered 2019-04-02: 4 mg via INTRAVENOUS

## 2019-04-02 MED ORDER — CELECOXIB 200 MG PO CAPS
200.0000 mg | ORAL_CAPSULE | ORAL | Status: DC
  Filled 2019-04-02: qty 1

## 2019-04-02 MED ORDER — LIDOCAINE-EPINEPHRINE (PF) 1 %-1:200000 IJ SOLN
INTRAMUSCULAR | Status: DC | PRN
  Administered 2019-04-02: 2 mL

## 2019-04-02 MED ORDER — KETOROLAC TROMETHAMINE 30 MG/ML IJ SOLN
INTRAMUSCULAR | Status: DC | PRN
  Administered 2019-04-02: 30 mg via INTRAVENOUS

## 2019-04-02 MED ORDER — KETAMINE HCL 10 MG/ML IJ SOLN: INTRAMUSCULAR | Status: DC | PRN

## 2019-04-02 MED ORDER — CELECOXIB 200 MG PO CAPS
ORAL_CAPSULE | ORAL | Status: AC
  Filled 2019-04-02: qty 1

## 2019-04-02 MED ORDER — GABAPENTIN 300 MG PO CAPS
ORAL_CAPSULE | ORAL | Status: AC
  Filled 2019-04-02: qty 1

## 2019-04-02 MED ORDER — MIDAZOLAM HCL 5 MG/5ML IJ SOLN
INTRAMUSCULAR | Status: DC | PRN
  Administered 2019-04-02: 2 mg via INTRAVENOUS

## 2019-04-02 MED ORDER — CEFAZOLIN SODIUM-DEXTROSE 2-4 GM/100ML-% IV SOLN
2.0000 g | INTRAVENOUS | Status: AC
  Administered 2019-04-02: 2 g via INTRAVENOUS
  Filled 2019-04-02: qty 100

## 2019-04-02 MED ORDER — FENTANYL CITRATE (PF) 100 MCG/2ML IJ SOLN
INTRAMUSCULAR | Status: DC | PRN
  Administered 2019-04-02: 50 ug via INTRAVENOUS

## 2019-04-02 MED ORDER — CEFAZOLIN SODIUM-DEXTROSE 2-4 GM/100ML-% IV SOLN
INTRAVENOUS | Status: AC
  Filled 2019-04-02: qty 100

## 2019-04-02 MED ORDER — DEXAMETHASONE SODIUM PHOSPHATE 10 MG/ML IJ SOLN
INTRAMUSCULAR | Status: DC | PRN
  Administered 2019-04-02: 5 mg via INTRAVENOUS

## 2019-04-02 MED ORDER — MIDAZOLAM HCL 2 MG/2ML IJ SOLN
INTRAMUSCULAR | Status: AC
  Filled 2019-04-02: qty 2

## 2019-04-02 MED ORDER — ONDANSETRON HCL 4 MG/2ML IJ SOLN
INTRAMUSCULAR | Status: AC
  Filled 2019-04-02: qty 2

## 2019-04-02 MED ORDER — GABAPENTIN 300 MG PO CAPS
300.0000 mg | ORAL_CAPSULE | ORAL | Status: DC
  Filled 2019-04-02: qty 1

## 2019-04-02 SURGICAL SUPPLY — 32 items
BLADE SURG 15 STRL LF DISP TIS (BLADE) ×1 IMPLANT
BLADE SURG 15 STRL SS (BLADE) ×1
BNDG GAUZE ELAST 4 BULKY (GAUZE/BANDAGES/DRESSINGS) ×2 IMPLANT
COVER BACK TABLE 60X90IN (DRAPES) ×2 IMPLANT
COVER MAYO STAND STRL (DRAPES) ×2 IMPLANT
COVER WAND RF STERILE (DRAPES) ×4 IMPLANT
DRAIN PENROSE 18X1/4 LTX STRL (WOUND CARE) IMPLANT
DRAPE LAPAROTOMY 100X72 PEDS (DRAPES) ×2 IMPLANT
ELECT REM PT RETURN 9FT ADLT (ELECTROSURGICAL) ×2
ELECTRODE REM PT RTRN 9FT ADLT (ELECTROSURGICAL) ×1 IMPLANT
GLOVE BIOGEL M STRL SZ7.5 (GLOVE) ×2 IMPLANT
GOWN STRL REUS W/ TWL XL LVL3 (GOWN DISPOSABLE) ×1 IMPLANT
GOWN STRL REUS W/TWL XL LVL3 (GOWN DISPOSABLE) ×1
KIT TURNOVER CYSTO (KITS) ×2 IMPLANT
MANIFOLD NEPTUNE II (INSTRUMENTS) IMPLANT
NEEDLE HYPO 22GX1.5 SAFETY (NEEDLE) ×2 IMPLANT
NS IRRIG 500ML POUR BTL (IV SOLUTION) ×2 IMPLANT
PACK BASIN DAY SURGERY FS (CUSTOM PROCEDURE TRAY) ×2 IMPLANT
PENCIL BUTTON HOLSTER BLD 10FT (ELECTRODE) ×2 IMPLANT
SPONGE LAP 4X18 RFD (DISPOSABLE) ×1 IMPLANT
SUPPORT SCROTAL LG STRP (MISCELLANEOUS) IMPLANT
SUPPORT SCROTAL MED ADLT STRP (MISCELLANEOUS) ×1 IMPLANT
SUT CHROMIC 3 0 SH 27 (SUTURE) ×1 IMPLANT
SUT PDS AB 4-0 RB1 27 (SUTURE) IMPLANT
SUT VIC AB 2-0 SH 27 (SUTURE) ×1
SUT VIC AB 2-0 SH 27XBRD (SUTURE) ×1 IMPLANT
SYR BULB IRRIGATION 50ML (SYRINGE) ×2 IMPLANT
SYR CONTROL 10ML LL (SYRINGE) IMPLANT
TRAY DSU PREP LF (CUSTOM PROCEDURE TRAY) ×2 IMPLANT
TUBE CONNECTING 12X1/4 (SUCTIONS) ×2 IMPLANT
WATER STERILE IRR 500ML POUR (IV SOLUTION) IMPLANT
YANKAUER SUCT BULB TIP NO VENT (SUCTIONS) ×2 IMPLANT

## 2019-04-02 NOTE — Op Note (Signed)
Preoperative diagnosis: left hydrocele   Postoperative diagnosis: left hydrocele   Procedure: Left hydrocelectomy  Surgeon: Samara Deist; Gen  Indication for procedure: 68 yo male with bothersome left hydrocele.   Findings: normal circumcised penis with mass or lesion. Right testicle palpably normal. Left hydrocele 400 ml. Normal appearing and palpably normal left epididymis and testicle. No herniation of bowel into the scrotum was noted. The hydrocele was isolated and blind ending.   Description of procedure: After consent was obtained the patient was brought to the OR and placed supine. After adequate anesthesia he was prepped and draped in the usual sterile fashion. A timeout was performed. Local (0.5% marcaine/1% lidocaine) was placed and a 4 cm left transverse scrotal incision was made. Dissection through the dartos fascia to the hydrocele sac. The sac was opened and some fluid spilled. The sac was drained of 400 ml clear, straw colored fluid. The left testicle was examined. The hydrocele sac was trimmed on the lateral side, everted and sutured to itself loosely around the cord. Hemostasis was excellent. The testicle was placed into the left hemiscrotum after irrigation without torsion. The dartos was closed with interrupted 2-0 vicryl and the skin closed with interrupted 3-0 chromic. Telfa, fluff and jock strap applied. He was awakened and taken to the recovery room in stable condition.   Complications: none  EBL: minimal  Specimens: none  Drains: none  Disposition: stable to PACU

## 2019-04-02 NOTE — Discharge Instructions (Signed)
Hydrocelectomy, Adult, Care After This sheet gives you information about how to care for yourself after your procedure. Your health care provider may also give you more specific instructions. If you have problems or questions, contact your health care provider. What can I expect after the procedure? After your procedure, it is common to have mild discomfort, swelling, and bruising in the pouch that holds your testicles (scrotum). Follow these instructions at home: Bathing  You may shower starting tomorrow on Apr 03, 2019.  Do not spray the incision directly.  Pat it dry.    If you were told to wear an athletic support strap, take it off when you shower.  Do not take a bath or submerge the incision.  Incision care   Follow instructions from your health care provider about how to take care of your incision. Make sure you: ? Wash your hands with soap and water before you change your bandage (dressing). If soap and water are not available, use hand sanitizer. ? Change your dressing as told by your health care provider. ? Leave stitches (sutures) in place.  Check your incision and scrotum every day for signs of infection. Check for: ? More redness, swelling, or pain. ? Blood or fluid. ? Warmth. ? Pus or a bad smell. Managing pain, stiffness, and swelling  If directed, apply ice to the injured area: ? Put ice in a plastic bag. ? Place a towel between your skin and the bag. ? Leave the ice on for 20-30 minutes, 2-3 times per day. Driving  Do not drive for 24 hours if you were given a sedative.  Do not drive or use heavy machinery while taking prescription pain medicine.  Ask your health care provider when it is safe to drive. Activity  Do not do any activities that require great strength and energy (are vigorous) for as long as told by your health care provider.  Return to your normal activities as told by your health care provider. Ask your health care provider what activities are  safe for you.  Do not lift anything that is heavier than 10 lb (4.5 kg) until your health care provider says that it is safe. General instructions  Take over-the-counter and prescription medicines only as told by your health care provider.  Keep all follow-up visits as told by your health care provider. This is important.  If you were given an athletic support strap, wear it as told by your health care provider.  If you had a drain put in during the procedure, you will need to return for a follow-up visit to have it removed. Contact a health care provider if:  Your pain is not controlled with medicine.  You have more redness or swelling around your scrotum.  You have blood or fluid coming from your scrotum.  Your incision feels warm to the touch.  You have pus or a bad smell coming from your scrotum.  You have a fever. This information is not intended to replace advice given to you by your health care provider. Make sure you discuss any questions you have with your health care provider. Document Released: 07/15/2015 Document Revised: 07/23/2016 Document Reviewed: 07/23/2016 Elsevier Interactive Patient Education  2019 [facility] Care Instructions  Activity: Get plenty of rest for the remainder of the day. A responsible individual must stay with you for 24 hours following the procedure.  For the next 24 hours, DO NOT: -Drive a car -Paediatric nurse -Drink alcoholic beverages -  Take any medication unless instructed by your physician -Make any legal decisions or sign important papers.  Meals: Start with liquid foods such as gelatin or soup. Progress to regular foods as tolerated. Avoid greasy, spicy, heavy foods. If nausea and/or vomiting occur, drink only clear liquids until the nausea and/or vomiting subsides. Call your physician if vomiting continues.  Special Instructions/Symptoms: Your throat may feel dry or sore from the anesthesia or the  breathing tube placed in your throat during surgery. If this causes discomfort, gargle with warm salt water. The discomfort should disappear within 24 hours.  If you had a scopolamine patch placed behind your ear for the management of post- operative nausea and/or vomiting:  1. The medication in the patch is effective for 72 hours, after which it should be removed.  Wrap patch in a tissue and discard in the trash. Wash hands thoroughly with soap and water. 2. You may remove the patch earlier than 72 hours if you experience unpleasant side effects which may include dry mouth, dizziness or visual disturbances. 3. Avoid touching the patch. Wash your hands with soap and water after contact with the patch.

## 2019-04-02 NOTE — H&P (Addendum)
H&P  Chief Complaint: left hydrocele  History of Present Illness: 68 yo male with symptomatic left hydrocele for 10 months or more. [place] Feb 2020 revealed a 15 cm left hydrocele. Otherwise he is well. No voiding complaints. He has a left inguinal hernia repair coming up in about a month. He wanted to go ahead and get the hydrocele resolved.   Past Medical History:  Diagnosis Date  . Chronic systolic CHF (congestive heart failure) ([place])   . Gastroesophageal reflux disease   . History of alcohol abuse    quit 08/ 20/ 2018  . History of cardiac arrest   . History of encephalopathy 07/05/2017   anoxic-ischemic   . History of ST elevation myocardial infarction (STEMI) 06/26/2017   sudden cardiac arrest /  per cardiac cath 06-26-2017 normal coronaries, ef <20% , severe LVSD; dx takatsuki syndrome  . History of sudden cardiac arrest 06/26/2017   VF arrest / STEMI   . Hypertension   . 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 Left hydrocele   . Left inguinal hernia   . NICM (nonischemic cardiomyopathy) ([place])    06-26-2017  per cardiac cath , ef <20% (echo 35-40%)  ;  last echo 10-09-2014 ef 60-65%  . OSA (obstructive sleep apnea)    03-26-2019 per pt had sleep study after heart attack, was told no recommendation , but did stop smoking and alcohol  . Pre-diabetes   . 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 CATARACT EXTRACTION W/ INTRAOCULAR LENS IMPLANT Left 2017  . ICD GENERATOR CHANGEOUT N/A 07/22/2018   Procedure: [place];  Surgeon: Evans Lance, MD;  Location: [facility];  Service: Cardiovascular;  Laterality: N/A;  . ICD IMPLANT N/A 07/04/2017   SJM Fortify Assura VR ICD implanted by Dr Rayann Heman for secondary prevention after VF arrest  . LEFT  HEART CATH AND CORONARY ANGIOGRAPHY N/A 06/26/2017   Procedure: LEFT HEART CATH AND CORONARY ANGIOGRAPHY;  Surgeon: Lorretta Harp, MD;  Location: [facility];  Service: Cardiovascular;  Laterality: N/A;  . MENISCUS REPAIR Left 1983   left knee  . TOTAL KNEE ARTHROPLASTY Left 2009  approx.  Marland Kitchen TOTAL KNEE REVISION Left 04/18/2018   Procedure: LEFT TOTAL KNEE REVISION;  Surgeon: Gaynelle Arabian, MD;  Location: [facility];  Service: Orthopedics;  Laterality: Left;  Adductor Block    Home Medications:  Medications Prior to Admission  Medication Sig Dispense Refill Last Dose  . atorvastatin (LIPITOR) 20 MG tablet Take 20 mg by mouth daily.   04/02/2019 at 0500  . carvedilol (COREG) 25 MG tablet Take 0.5 tablets (12.5 mg total) by mouth 2 (two) times daily. 90 tablet 3 04/02/2019 at 0500  . Cholecalciferol (VITAMIN D3) 125 MCG (5000 UT) CAPS Take 1 capsule by mouth daily.   Past Week at Unknown time  . ibuprofen (ADVIL,MOTRIN) 200 MG tablet Take 400 mg by mouth every 6 (six) hours as needed (pain).   Past Week at Unknown time  . losartan (COZAAR) 100 MG tablet TAKE 1 TABLET DAILY (Patient taking differently: No sig reported) 90 tablet 3 04/01/2019 at Unknown time  . montelukast (SINGULAIR) 10 MG tablet Take 1 tablet (10 mg total) by mouth daily. (Patient taking differently: Take 10 mg by mouth daily. )  90 tablet 2 03/31/2019  . omeprazole (PRILOSEC) 40 MG capsule Take 1 capsule (40 mg total) by mouth daily. (Patient taking differently: Take 40 mg by mouth daily. ) 90 capsule 2 04/02/2019 at 0500  . spironolactone (ALDACTONE) 25 MG tablet Take 1 tablet (25 mg total) by mouth daily. 30 tablet 6 04/01/2019 at Unknown time   Allergies: No Known Allergies  Family History  Problem Relation Age of Onset  . Hypertension Mother   . Diabetes Father   . Cancer Brother        pancreatic   Social History:  reports that he quit smoking about 21 months ago. His smoking use included cigars. He has never used  smokeless tobacco. He reports previous alcohol use. He reports that he does not use drugs.  ROS: A complete review of systems was performed.  All systems are negative except for pertinent findings as noted. Review of Systems  All other systems reviewed and are negative.    Physical Exam:  Vital signs in last 24 hours: Temp:  [98.3 F (36.8 C)] 98.3 F (36.8 C) (05/26 0534) Pulse Rate:  [65] 65 (05/26 0534) Resp:  [16] 16 (05/26 0534) BP: (144)/(99) 144/99 (05/26 0534) SpO2:  [96 %] 96 % (05/26 0534) Weight:  [91.4 kg] 91.4 kg (05/26 0534) General:  Alert and oriented, No acute distress HEENT: Normocephalic, atraumatic Neck: No JVD or lymphadenopathy Cardiovascular: Regular rate and rhythm Lungs: Regular rate and effort Abdomen: Soft, nontender, nondistended, no abdominal masses Back: No CVA tenderness Extremities: No edema Neurologic: Grossly intact GU: circumcised penis normal, large left scrotal fluid sac, right testicle palpable.   Laboratory Data:  Results for orders placed or performed during the hospital encounter of 04/02/19 (from the past 24 hour(s))  I-STAT, chem 8     Status: Abnormal   Collection Time: 04/02/19  6:35 AM  Result Value Ref Range   Sodium 137 135 - 145 mmol/L   Potassium 3.9 3.5 - 5.1 mmol/L   Chloride 100 98 - 111 mmol/L   BUN 24 (H) 8 - 23 mg/dL   Creatinine, Ser 1.10 0.61 - 1.24 mg/dL   Glucose, Bld 119 (H) 70 - 99 mg/dL   Calcium, Ion 1.23 1.15 - 1.40 mmol/L   TCO2 25 22 - 32 mmol/L   Hemoglobin 16.3 13.0 - 17.0 g/dL   HCT 48.0 39.0 - 52.0 %   Recent Results (from the past 240 hour(s))  Novel Coronavirus, NAA (hospital order; send-out to ref lab)     Status: None   Collection Time: 03/29/19  9:38 AM  Result Value Ref Range Status   SARS-CoV-2, NAA NOT DETECTED NOT DETECTED Final    Comment: (NOTE) Testing was performed using the cobas(R) SARS-CoV-2 test. This test was developed and its performance characteristics determined by [employer]. This test has not been FDA cleared or approved. This test has been authorized by FDA under an Emergency Use Authorization (EUA). This test is only authorized for the duration of time the declaration that circumstances exist justifying the authorization of the emergency use of in vitro diagnostic tests for detection of SARS-CoV-2 virus and/or diagnosis of COVID-19 infection under section 564(b)(1) of the Act, 21 U.S.C. 664QIH-4(V)(4), unless the authorization is terminated or revoked sooner. When diagnostic testing is negative, the possibility of a false negative result should be considered in the context of a patient's recent exposures and the presence of clinical signs and symptoms consistent with COVID-19. An individual without symptoms of COVID-19 and  who is not shedding SARS-CoV-2 virus would expect to have  a negative (not detected) result in this assay. Performed At: [facility] [address] [place], [place] [id number] Rush Farmer MD BR:[id number]    Bright  Final    Comment: Performed at [facility], [place] [address], [place], [place] [address]   Creatinine: Recent Labs    04/02/19 3159  CREATININE 1.10    Impression/Assessment:  Left hydrocele  Plan:  I discussed with the patient the nature, potential benefits, risks and alternatives to left hydrocelectomy, including side effects of the proposed treatment, the likelihood of the patient achieving the goals of the procedure, and any potential problems that might occur during the procedure or recuperation. We also discussed he has a small right hydrocele but some fluid around the testicle is physiologic and we won't approach the right side. We also discussed the risk of proceeding with possible exposure to novel coronavirus and the real risk of delay, including the expectation that a delay of 6-8 more weeks may be required to emerge from an environment in which COVID-19  is less prevalent. All questions answered. Patient elects to proceed. We discussed post-op drain care if needed. We discussed activity limitations.    Festus Aloe 04/02/2019, 7:27 AM

## 2019-04-02 NOTE — Transfer of Care (Signed)
Immediate Anesthesia Transfer of Care Note  Patient: Jeffery Thomas  Procedure(s) Performed: HYDROCELECTOMY ADULT (Left )  Patient Location: PACU  Anesthesia Type:General  Level of Consciousness: alert  and oriented  Airway & Oxygen Therapy: Patient Spontanous Breathing and Patient connected to nasal cannula oxygen  Post-op Assessment: Report given to RN and Post -op Vital signs reviewed and stable  Post vital signs: Reviewed and stable  Last Vitals:  Vitals Value Taken Time  BP    Temp    Pulse 62 04/02/2019  8:41 AM  Resp 12 04/02/2019  8:41 AM  SpO2 98 % 04/02/2019  8:41 AM  Vitals shown include unvalidated device data.  Last Pain:  Vitals:   04/02/19 0534  TempSrc: Oral  PainSc: 0-No pain      Patients Stated Pain Goal: 5 (84/03/75 4360)  Complications: No apparent anesthesia complications

## 2019-04-02 NOTE — Anesthesia Procedure Notes (Signed)
Procedure Name: LMA Insertion Date/Time: 04/02/2019 7:41 AM Performed by: Gwyndolyn Saxon, CRNA Pre-anesthesia Checklist: Patient identified, Emergency Drugs available, Suction available and Patient being monitored Patient Re-evaluated:Patient Re-evaluated prior to induction Oxygen Delivery Method: Circle system utilized Preoxygenation: Pre-oxygenation with 100% oxygen Induction Type: IV induction Ventilation: Mask ventilation without difficulty LMA: LMA inserted LMA Size: 4.0 Grade View: Grade I Number of attempts: 1 Placement Confirmation: positive ETCO2 and breath sounds checked- equal and bilateral Tube secured with: Tape Dental Injury: Teeth and Oropharynx as per pre-operative assessment

## 2019-04-02 NOTE — Anesthesia Postprocedure Evaluation (Signed)
Anesthesia Post Note  Patient: Jeffery Thomas  Procedure(s) Performed: HYDROCELECTOMY ADULT (Left )     Patient location during evaluation: PACU Anesthesia Type: General Level of consciousness: sedated and patient cooperative Pain management: pain level controlled Vital Signs Assessment: post-procedure vital signs reviewed and stable Respiratory status: spontaneous breathing Cardiovascular status: stable Anesthetic complications: no    Last Vitals:  Vitals:   04/02/19 0900 04/02/19 0940  BP: (!) 156/90 (!) 152/98  Pulse: (!) 58 (!) 57  Resp: 12 16  Temp:  36.5 C  SpO2: 98% 96%    Last Pain:  Vitals:   04/02/19 0940  TempSrc:   PainSc: 0-No pain                 Nolon Nations

## 2019-04-03 ENCOUNTER — Encounter: Payer: Self-pay | Admitting: Cardiology

## 2019-04-03 ENCOUNTER — Encounter (HOSPITAL_BASED_OUTPATIENT_CLINIC_OR_DEPARTMENT_OTHER): Payer: Self-pay | Admitting: Urology

## 2019-04-03 NOTE — Progress Notes (Signed)
Letter  

## 2019-04-08 ENCOUNTER — Ambulatory Visit (INDEPENDENT_AMBULATORY_CARE_PROVIDER_SITE_OTHER): Payer: Medicare Other

## 2019-04-08 DIAGNOSIS — Z95810 Presence of automatic (implantable) cardiac defibrillator: Secondary | ICD-10-CM | POA: Diagnosis not present

## 2019-04-08 DIAGNOSIS — I428 Other cardiomyopathies: Secondary | ICD-10-CM | POA: Diagnosis not present

## 2019-04-09 ENCOUNTER — Telehealth: Payer: Self-pay

## 2019-04-09 NOTE — Telephone Encounter (Signed)
Left message for patient to remind of missed remote transmission.  

## 2019-04-10 ENCOUNTER — Other Ambulatory Visit: Payer: Self-pay | Admitting: Internal Medicine

## 2019-04-10 MED ORDER — CARVEDILOL 25 MG PO TABS: 12.5000 mg | ORAL_TABLET | Freq: Two times a day (BID) | ORAL | 1 refills | Status: DC

## 2019-04-10 NOTE — Progress Notes (Signed)
EPIC Encounter for ICM Monitoring  Patient Name: Jeffery Thomas is a 68 y.o. male Date: 04/10/2019 Primary Care Physican: Luetta Nutting, DO Primary Cardiologist:Berry Electrophysiologist:Allred LastWeight:201lbs -office weight  Transmission reviewed and results sent via mychart.   [place] Thoracic impedancenormal but was abnormal suggesting fluid accumulation from 5/26-5/30 which correlates with scheduled outpatient surgery on 5/26.   Labs: 07/20/2018 Creatinine1.03Gerhard Munch, Potassium3.4, [id number] 07/17/2018 Creatinine1.00, BUN19, Potassium4.4, Sodium140, [id number]  04/20/2018 Creatinine1.00, BUN19, Potassium3.7, Sodium140  06/13/2019Creatinine 1.00, BUN16, Potassium4.6, Sodium138  06/07/2019Creatinine 1.07, BUN17, Potassium4.0, Sodium141 A complete set of results can be found in Results Review.  Recommendations: Advised to call if experiencing any fluid symptoms.  Follow-up plan: ICM clinic phone appointment on7/04/2019  Copy of ICM check sent to Dr.Allred   3 month ICM trend: 04/09/2019    1 Year ICM trend:       Rosalene Billings, RN 04/10/2019 9:11 AM

## 2019-05-02 ENCOUNTER — Other Ambulatory Visit: Payer: Self-pay

## 2019-05-02 ENCOUNTER — Encounter (HOSPITAL_COMMUNITY)
Admission: RE | Admit: 2019-05-02 | Discharge: 2019-05-02 | Disposition: A | Discharge status: Home / Self Care | Payer: Medicare Other | Source: Ambulatory Visit | Attending: General Surgery | Admitting: General Surgery

## 2019-05-02 ENCOUNTER — Encounter (HOSPITAL_COMMUNITY): Payer: Self-pay

## 2019-05-02 DIAGNOSIS — Z01812 Encounter for preprocedural laboratory examination: Secondary | ICD-10-CM | POA: Diagnosis not present

## 2019-05-02 DIAGNOSIS — Z1159 Encounter for screening for other viral diseases: Secondary | ICD-10-CM | POA: Insufficient documentation

## 2019-05-02 DIAGNOSIS — K4090 Unilateral inguinal hernia, without obstruction or gangrene, not specified as recurrent: Secondary | ICD-10-CM | POA: Diagnosis not present

## 2019-05-02 HISTORY — DX: Unilateral inguinal hernia, without obstruction or gangrene, not specified as recurrent: K40.90

## 2019-05-02 HISTORY — DX: Acute myocardial infarction, unspecified: I21.9

## 2019-05-02 LAB — BASIC METABOLIC PANEL
Anion gap: 10 (ref 5–15)
BUN: 16 mg/dL (ref 8–23)
CO2: 24 mmol/L (ref 22–32)
Calcium: 9.4 mg/dL (ref 8.9–10.3)
Chloride: 102 mmol/L (ref 98–111)
Creatinine, Ser: 1.02 mg/dL (ref 0.61–1.24)
GFR calc Af Amer: >60 mL/min (ref >60)
GFR calc non Af Amer: >60 mL/min (ref >60)
Glucose, Bld: 105 mg/dL — ABNORMAL HIGH (ref 70–99)
Potassium: 4.6 mmol/L (ref 3.5–5.1)
Sodium: 136 mmol/L (ref 135–145)

## 2019-05-02 LAB — CBC
HCT: 48 % (ref 39.0–52.0)
Hemoglobin: 15.5 g/dL (ref 13.0–17.0)
MCH: 27.9 pg (ref 26.0–34.0)
MCHC: 32.3 g/dL (ref 30.0–36.0)
MCV: 86.3 fL (ref 80.0–100.0)
Platelets: 264 10*3/uL (ref 150–400)
RBC: 5.56 MIL/uL (ref 4.22–5.81)
RDW: 12.7 % (ref 11.5–15.5)
WBC: 6.3 10*3/uL (ref 4.0–10.5)
nRBC: 0 % (ref 0.0–0.2)

## 2019-05-02 NOTE — Pre-Procedure Instructions (Signed)
   Jeffery Thomas  05/02/2019      Your procedure is scheduled on Tuesday, May 07, 2019  Report to [facility] Admitting at 9:00 A.M.  Call this number if you have problems the morning of surgery:  [phone]   Remember: Brush your teeth the morning of surgery with your regular toothpaste.  Do not eat after midnight Monday, April 09, 2019  You may drink clear liquids until 8:00A.M. .  Clear liquids allowed are:                    Water, Juice (non-citric and without pulp), Carbonated beverages, Clear Tea, Black Coffee only, Plain Jell-O only, Gatorade and Plain Popsicles only    Take these medicines the morning of surgery with A SIP OF WATER : atorvastatin (LIPITOR), carvedilol (COREG), montelukast (SINGULAIR), omeprazole (PRILOSEC) Stop taking Aspirin (unless otherwise advised by surgeon), vitamins, fish oil and herbal medications. Do not take any NSAIDs ie: Ibuprofen, Advil, Naproxen (Aleve), Motrin, BC and Goody Powder; stop now.   Do not wear jewelry, make-up or nail polish.  Do not wear lotions, powders, or perfumes, or deodorant.  Do not shave 48 hours prior to surgery.  Men may shave face and neck.  Do not bring valuables to the hospital.  [facility] is not responsible for any belongings or valuables.  Contacts, dentures or bridgework may not be worn into surgery.  For patients admitted to the hospital, discharge time will be determined by your treatment team.  Patients discharged the day of surgery will not be allowed to drive home.  Special instructions: Shower the night before and morning of surgery with CHG.  Please read over the following fact sheets that you were given. Pain Booklet, Coughing and Deep Breathing and Surgical Site Infection Prevention

## 2019-05-02 NOTE — Progress Notes (Signed)
Pt denies SOB and chest pain. Pt stated that he is under the care of Dr. Gwenlyn Found Cardiology and Dr. Rayann Heman, Cardiology (EP). Pt stated that a stress test was performed > 10 years ago. Pt stated that he is under the care of Dr. Luetta Nutting, PCP. Pt denies having a chest x ray within the last year. Pt denies recent labs. Peri-op prescription for ICD initiated; awaiting response.  Pt denies that he and spouse tested positive for COVID-19. Pt denies that he and spouse experienced the following symptoms:  Cough yes/no: No Fever (>100.25F)  yes/no: No Runny nose yes/no: No Sore throat yes/no: No Difficulty breathing/shortness of breath  yes/no: No  Have you or a family member traveled in the last 14 days and where? yes/no: No   Pt reminded that hospital visitation restrictions are in effect and the importance of the restrictions.   Pt verbalized understanding of al pre-op instructions.  Pt chart forwarded to PA, Anesthesiology, for review of cardiac history.

## 2019-05-03 ENCOUNTER — Other Ambulatory Visit (HOSPITAL_COMMUNITY)
Admission: RE | Admit: 2019-05-03 | Discharge: 2019-05-03 | Disposition: A | Discharge status: Home / Self Care | Payer: Medicare Other | Source: Ambulatory Visit | Attending: General Surgery | Admitting: General Surgery

## 2019-05-03 DIAGNOSIS — Z01812 Encounter for preprocedural laboratory examination: Secondary | ICD-10-CM | POA: Diagnosis not present

## 2019-05-03 LAB — SARS CORONAVIRUS 2 (TAT 6-24 HRS): SARS Coronavirus 2: NEGATIVE

## 2019-05-03 NOTE — Anesthesia Preprocedure Evaluation (Addendum)
Anesthesia Evaluation  Patient identified by MRN, date of birth, ID band Patient awake    Reviewed: Allergy & Precautions, NPO status , Patient's Chart, lab work & pertinent test results, reviewed documented beta blocker date and time   History of Anesthesia Complications Negative for: history of anesthetic complications  Airway Mallampati: II  TM Distance: >3 FB Neck ROM: Full    Dental  (+) Dental Advisory Given   Pulmonary sleep apnea (does not use CPAP) , COPD,  COPD inhaler, former smoker,  05/03/2019 SARS coronavirus NEG   breath sounds clear to auscultation       Cardiovascular hypertension, Pt. on medications and Pt. on home beta blockers (-) angina+ Past MI  + dysrhythmias Ventricular Tachycardia + Cardiac Defibrillator (h/o VT arrest, device has never shocked him)  Rhythm:Regular Rate:Normal  12/18 ECHO: EF 60-65%, valves OK 8/18 sudden cardiac arrest 8/18 Cath: normal coronaries, EF<20%, severe LVSD; dx takotsubo syndrome      Neuro/Psych negative neurological ROS     GI/Hepatic GERD  Medicated and Controlled,(+)     substance abuse (sober since 2018)  alcohol use,   Endo/Other  negative endocrine ROS  Renal/GU negative Renal ROS     Musculoskeletal   Abdominal   Peds  Hematology negative hematology ROS (+)   Anesthesia Other Findings   Reproductive/Obstetrics                           Anesthesia Physical Anesthesia Plan  ASA: III  Anesthesia Plan: General   Post-op Pain Management: GA combined w/ Regional for post-op pain   Induction: Intravenous  PONV Risk Score and Plan: 2 and Ondansetron and Dexamethasone  Airway Management Planned: LMA  Additional Equipment:   Intra-op Plan:   Post-operative Plan:   Informed Consent: I have reviewed the patients History and Physical, chart, labs and discussed the procedure including the risks, benefits and alternatives for  the proposed anesthesia with the patient or authorized representative who has indicated his/her understanding and acceptance.     Dental advisory given  Plan Discussed with: CRNA and Surgeon  Anesthesia Plan Comments: (PAT note written 05/03/2019 by Myra Gianotti, PA-C. Has St. Jude ICD.)      Anesthesia Quick Evaluation

## 2019-05-03 NOTE — Progress Notes (Signed)
Anesthesia Chart Review:  Case: [id number] Date/Time: 05/07/19 1045   Procedure: OPEN REPAIR LEFT INGUINAL HERNIA WITH MESH (Left )   Anesthesia type: General   Pre-op diagnosis: left inguinal hernia   Location: [facility] ROOM 09 / [facility]   Surgeon: Fanny Skates, MD      DISCUSSION: Patient is a 68 year old male scheduled for the above procedure.  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   He underwent left hydrocelectomy 04/02/19 and had presurgical cardiology input for that procedure ("acceptable risk"). EF has recovered. ICD prescription requested.   He is for presurgical COVID testing on 05/03/19.  If negative and otherwise no acute changes and I would anticipate that he can proceed as planned.   VS: BP (!) 149/87   Pulse (!) 58   Temp (!) 36.2 C (Temporal)   Resp 20   Ht 6' (1.829 m)   Wt 91.1 kg   SpO2 95%   BMI 27.23 kg/m    PROVIDERS: Luetta Nutting, DO is PCP Quay Burow, MD is cardiologist Thompson Grayer, MD is EP cardiologist. Last visit 11/23/18. Normal ICD function. One year office follow-up recommended.   LABS: Labs reviewed: Acceptable for surgery. (all labs ordered are listed, but only abnormal results are displayed)  Labs Reviewed  BASIC METABOLIC PANEL - Abnormal; Notable for the following components:      Result Value   Glucose, Bld 105 (*)    All other components within normal limits  CBC    EKG: 11/23/18: SB at 52 bpm, non-specific ST abnormality.   CV: Echo 10/09/17 Study Conclusions - Left ventricle: The cavity size was normal. Wall thickness was normal. Systolic function  was normal. The estimated ejection fraction was in the range of 60% to 65%. - Aorta: Ascending aorta is mildly dilated at 40 mm. (Comparison LVEF 35-40% 06/28/17)  Cardiac Cath 06/26/2017  There is severe left ventricular systolic dysfunction.  LV end diastolic pressure is mildly elevated. The left ventricular ejection fraction is less than 25% by visual estimate. - Mr. Helms has normal coronary arteries and severe LV dysfunction in a configuration consistent with "Takatsubo  Syndrome". His EF is approximately 20%.   Past Medical History:  Diagnosis Date  . Chronic systolic CHF (congestive heart failure) ([place])   . Gastroesophageal reflux disease   . History of alcohol abuse    quit 08/ 20/ 2018  . History of cardiac arrest   . History of encephalopathy 07/05/2017   anoxic-ischemic   . History of ST elevation myocardial infarction (STEMI) 06/26/2017   sudden cardiac arrest /  per cardiac cath 06-26-2017 normal coronaries, ef <20% , severe LVSD; dx takatsuki syndrome  . History of sudden cardiac arrest 06/26/2017   VF arrest / STEMI   . Hypertension   . 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 Inguinal hernia    left  . Left hydrocele   . Left inguinal hernia   . Myocardial infarction ([place])   . NICM (nonischemic cardiomyopathy) ([place])    06-26-2017  per cardiac cath , ef <20% (echo 35-40%)  ;  last echo 10-09-2014 ef  60-65%  . OSA (obstructive sleep apnea)    03-26-2019 per pt had sleep study after heart attack, was told no recommendation , but did stop smoking and alcohol  . Pre-diabetes    denies  . 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 CATARACT EXTRACTION W/ INTRAOCULAR LENS IMPLANT Left 2017  . COLONOSCOPY    . HYDROCELE EXCISION Left 04/02/2019    Procedure: HYDROCELECTOMY ADULT;  Surgeon: Festus Aloe, MD;  Location: [facility];  Service: Urology;  Laterality: Left;  . ICD GENERATOR CHANGEOUT N/A 07/22/2018   Procedure: [place];  Surgeon: Evans Lance, MD;  Location: [facility];  Service: Cardiovascular;  Laterality: N/A;  . ICD IMPLANT N/A 07/04/2017   SJM Fortify Assura VR ICD implanted by Dr Rayann Heman for secondary prevention after VF arrest  . LEFT HEART CATH AND CORONARY ANGIOGRAPHY N/A 06/26/2017   Procedure: LEFT HEART CATH AND CORONARY ANGIOGRAPHY;  Surgeon: Lorretta Harp, MD;  Location: [facility];  Service: Cardiovascular;  Laterality: N/A;  . MENISCUS REPAIR Left 1983   left knee  . TOTAL KNEE ARTHROPLASTY Left 2009  approx.  Marland Kitchen TOTAL KNEE REVISION Left 04/18/2018   Procedure: LEFT TOTAL KNEE REVISION;  Surgeon: Gaynelle Arabian, MD;  Location: [facility];  Service: Orthopedics;  Laterality: Left;  Adductor Block    MEDICATIONS: . atorvastatin (LIPITOR) 20 MG tablet  . carvedilol (COREG) 25 MG tablet  . Cholecalciferol (VITAMIN D3) 125 MCG (5000 UT) CAPS  . ibuprofen (ADVIL,MOTRIN) 200 MG tablet  . losartan (COZAAR) 100 MG tablet  . montelukast (SINGULAIR) 10 MG tablet  . omeprazole (PRILOSEC) 40 MG capsule  . spironolactone (ALDACTONE) 25 MG tablet   No current facility-administered medications for this encounter.     Myra Gianotti, PA-C Surgical Short Stay/Anesthesiology [facility] Phone [phone] [facility] Phone [phone] 05/03/2019 2:59 PM

## 2019-05-04 NOTE — H&P (Signed)
Jeffery Thomas Location: [facility] Patient #: [id number] DOB: [birth year] Married / Language: English / Race: White Male        History of Present Illness       This is a pleasant, 68 year old man, referred by Dr. Festus Aloe of [facility] for a preop evaluation of left inguinal hernia in anticipation of left hydrocelectomy as simultaneous procedure. Luetta Nutting, D.O. provides primary care. Jeffery Thomas is his cardiologist.     He's had a large hydrocele on the left side for 2 years. He saw Dr. Junious Silk. Dr. Junious Silk examined him and felt a small inguinal hernia above the hydrocele. He doesn't have any classic symptoms for hernia and was not aware of this but he does have a small reducible left inguinal hernia and a 5 cm hydrocele.     Comorbidities include myocardial infarction with cardiac arrest August 2019. He had CPR and was placed in hypothermic recovery and recovered. He has internal defibrillator on the left side placed by Dr. Rayann Heman. He has done well and is back to playing golf several times a week. He takes an aspirin daily. Hypertension. Left total knee replacement 2. Internal defibrillator. He is to have sleep apnea but once he stopped drinking that resolved. Family history reveals mother died of heart disease. Father died of alcoholism and renal disease. Social history reveals he is married. One child. Smokes cigars when he plays golf. Quit alcohol intake 9 months ago. 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      Importantly, his daughter is getting married in June in [place] and he wants to be healthy for that.      I told him he had a left inguinal hernia. Not immediately threatening to him but if he is going to have the hydrocele repaired he might as well have the hernia repaired same time due to the natural history of enlargement and pain and low risk but definite risk of incarceration. He very  much wants to have both done at the same time I will coordinate with Dr. Junious Silk. Plan open repair of left inguinal hernia with mesh through inguinal incision Dr. Junious Silk can repair the hydrocele through the same incision if he wishes or scrotal incision if that would be better We should do this surgery at [facility] because of his internal defibrillator This can be done as an outpatient from my point of view Stop aspirin 4 days preop Cardiac risk assessment and clearance requested from Dr. Quay Thomas    Addendum Note He called and informed us that he did not want combined procedures. Plan hydrocelectomy in late May. Plan [facility] repair June 30.   Past Surgical History  Knee Surgery  Left. Oral Surgery   Diagnostic Studies History Colonoscopy  1-5 years ago  Allergies No Known Drug Allergies  Allergies Reconciled   Medication History  Atorvastatin Calcium (20MG  Tablet, Oral) Active. Carvedilol (25MG  Tablet, Oral) Active. Omeprazole (40MG  Capsule DR, Oral) Active. Losartan Potassium (100MG  Tablet, Oral) Active. Montelukast Sodium (10MG  Tablet, Oral) Active. Spironolactone (25MG  Tablet, Oral) Active. Cyclobenzaprine HCl (10MG  Tablet, Oral) Active. methylPREDNISolone (4MG  Tab Ther Pack, Oral) Active. Medications Reconciled  Social History  Alcohol use  Recently quit alcohol use. Caffeine use  Coffee. Illicit drug use  Remotely quit drug use. Tobacco use  Former smoker.  Family History  Alcohol Abuse  Father. Diabetes Mellitus  Father. Heart Disease  Mother. Kidney Disease  Father.  Other Problems  Congestive Heart Failure  Gastroesophageal Reflux Disease  High blood pressure  Hypercholesterolemia  Sleep Apnea     Review of Systems  General Not Present- Appetite Loss, Chills, Fatigue, Fever, Night Sweats, Weight Gain and Weight Loss. Skin Not Present- Change in Wart/Mole, Dryness, Hives, Jaundice, New Lesions, Non-Healing  Wounds, Rash and Ulcer. HEENT Not Present- Earache, Hearing Loss, Hoarseness, Nose Bleed, Oral Ulcers, Ringing in the Ears, Seasonal Allergies, Sinus Pain, Sore Throat, Visual Disturbances, Wears glasses/contact lenses and Yellow Eyes. Respiratory Not Present- Bloody sputum, Chronic Cough, Difficulty Breathing, Snoring and Wheezing. Cardiovascular Not Present- Chest Pain, Difficulty Breathing Lying Down, Leg Cramps, Palpitations, Rapid Heart Rate, Shortness of Breath and Swelling of Extremities. Gastrointestinal Not Present- Abdominal Pain, Bloating, Bloody Stool, Change in Bowel Habits, Chronic diarrhea, Constipation, Difficulty Swallowing, Excessive gas, Gets full quickly at meals, Hemorrhoids, Indigestion, Nausea, Rectal Pain and Vomiting. Musculoskeletal Not Present- Back Pain, Joint Pain, Joint Stiffness, Muscle Pain, Muscle Weakness and Swelling of Extremities. Neurological Not Present- Decreased Memory, Fainting, Headaches, Numbness, Seizures, Tingling, Tremor, Trouble walking and Weakness. Psychiatric Not Present- Anxiety, Bipolar, Change in Sleep Pattern, Depression, Fearful and Frequent crying. Endocrine Not Present- Cold Intolerance, Excessive Hunger, Hair Changes, Heat Intolerance, Hot flashes and New Diabetes. Hematology Not Present- Blood Thinners, Easy Bruising, Excessive bleeding, Gland problems, HIV and Persistent Infections.  Vitals  Weight: 205.5 lb Height: 72in Body Surface Area: 2.16 m Body Mass Index: 27.87 kg/m  Temp.: 97.67F (Oral)  Pulse: 66 (Regular)  BP: 130/82(Sitting, Left Arm, Standard)       Physical Exam General Mental Status-Alert. General Appearance-Consistent with stated age. Hydration-Well hydrated. Voice-Normal.  Head and Neck Head-normocephalic, atraumatic with no lesions or palpable masses. Trachea-midline. Thyroid Gland Characteristics - normal size and consistency.  Eye Eyeball - Bilateral-Extraocular movements  intact. Sclera/Conjunctiva - Bilateral-No scleral icterus.  Chest and Lung Exam Chest and lung exam reveals -quiet, even and easy respiratory effort with no use of accessory muscles and on auscultation, normal breath sounds, no adventitious sounds and normal vocal resonance. Inspection Chest Wall - Normal. Back - normal. Note: Internal defibrillator palpable left infraclavicular area   Cardiovascular Cardiovascular examination reveals -normal heart sounds, regular rate and rhythm with no murmurs and normal pedal pulses bilaterally.  Abdomen Inspection Skin - Scar - no surgical scars. Palpation/Percussion Palpation and Percussion of the abdomen reveal - Soft, Non Tender, No Rebound tenderness, No Rigidity (guarding) and No hepatosplenomegaly. Auscultation Auscultation of the abdomen reveals - Bowel sounds normal. Note: Examined supine and standing. He has a large left scrotal hydrocele. Nontender and smooth. Separate and distinct from this above is a smaller inguinal hernia. He clearly has a bulge but it is not particularly tender and not large. I do not feel a hernia on the right groin. No femoral mass   Neurologic Neurologic evaluation reveals -alert and oriented x 3 with no impairment of recent or remote memory. Mental Status-Normal.  Musculoskeletal Normal Exam - Left-Upper Extremity Strength Normal and Lower Extremity Strength Normal. Normal Exam - Right-Upper Extremity Strength Normal and Lower Extremity Strength Normal.  Lymphatic Head & Neck  General Head & Neck Lymphatics: Bilateral - Description - Normal. Axillary  General Axillary Region: Bilateral - Description - Normal. Tenderness - Non Tender. Femoral & Inguinal  Generalized Femoral & Inguinal Lymphatics: Bilateral - Description - Normal. Tenderness - Non Tender.    Assessment & Plan   LEFT INGUINAL HERNIA (K40.90).    You have a small, reducible left inguinal hernia above your large  hydrocele  It is reasonable to offer elective repair of your left inguinal hernia at the same time that you have the hydrocele repaired by Dr. Junious Silk you state that you would like to have this done I have discussed the indications, techniques, and risk of this surgery with you in detail  No sports, heavy lifting, or golf for 1 month after the surgery Because of your internal defibrillator I think we should do this at [facility]  I will contact Dr. Junious Silk and coordinate the surgery  We will ask Dr. Gwenlyn Found to provide a cardiac clearance Discontinue aspirin 4 days preoperative okay with Dr. 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 Dalbert Batman, M.D., [facility] General and Minimally invasive Surgery Breast and Colorectal Surgery Office:   [phone] Pager:   [phone]

## 2019-05-07 ENCOUNTER — Ambulatory Visit (HOSPITAL_COMMUNITY)
Admission: RE | Admit: 2019-05-07 | Discharge: 2019-05-07 | Disposition: A | Discharge status: Home / Self Care | Payer: Medicare Other | Attending: General Surgery | Admitting: General Surgery

## 2019-05-07 ENCOUNTER — Encounter (HOSPITAL_COMMUNITY)
Admission: RE | Disposition: A | Discharge status: Home / Self Care | Payer: Self-pay | Source: Home / Self Care | Attending: General Surgery

## 2019-05-07 ENCOUNTER — Other Ambulatory Visit: Payer: Self-pay

## 2019-05-07 ENCOUNTER — Ambulatory Visit (HOSPITAL_COMMUNITY): Payer: Medicare Other | Admitting: Anesthesiology

## 2019-05-07 ENCOUNTER — Encounter (HOSPITAL_COMMUNITY): Payer: Self-pay | Admitting: General Surgery

## 2019-05-07 ENCOUNTER — Ambulatory Visit (HOSPITAL_COMMUNITY): Payer: Medicare Other | Admitting: Vascular Surgery

## 2019-05-07 DIAGNOSIS — I509 Heart failure, unspecified: Secondary | ICD-10-CM | POA: Insufficient documentation

## 2019-05-07 DIAGNOSIS — G4730 Sleep apnea, unspecified: Secondary | ICD-10-CM | POA: Insufficient documentation

## 2019-05-07 DIAGNOSIS — J449 Chronic obstructive pulmonary disease, unspecified: Secondary | ICD-10-CM | POA: Diagnosis not present

## 2019-05-07 DIAGNOSIS — Z7982 Long term (current) use of aspirin: Secondary | ICD-10-CM | POA: Diagnosis not present

## 2019-05-07 DIAGNOSIS — Z96652 Presence of left artificial knee joint: Secondary | ICD-10-CM | POA: Insufficient documentation

## 2019-05-07 DIAGNOSIS — K4090 Unilateral inguinal hernia, without obstruction or gangrene, not specified as recurrent: Secondary | ICD-10-CM

## 2019-05-07 DIAGNOSIS — E7800 Pure hypercholesterolemia, unspecified: Secondary | ICD-10-CM | POA: Insufficient documentation

## 2019-05-07 DIAGNOSIS — I110 Hypertensive heart disease with heart failure: Secondary | ICD-10-CM | POA: Diagnosis not present

## 2019-05-07 DIAGNOSIS — Z8674 Personal history of sudden cardiac arrest: Secondary | ICD-10-CM | POA: Diagnosis not present

## 2019-05-07 DIAGNOSIS — I252 Old myocardial infarction: Secondary | ICD-10-CM | POA: Diagnosis not present

## 2019-05-07 DIAGNOSIS — K219 Gastro-esophageal reflux disease without esophagitis: Secondary | ICD-10-CM | POA: Insufficient documentation

## 2019-05-07 DIAGNOSIS — Z95810 Presence of automatic (implantable) cardiac defibrillator: Secondary | ICD-10-CM | POA: Insufficient documentation

## 2019-05-07 DIAGNOSIS — Z8249 Family history of ischemic heart disease and other diseases of the circulatory system: Secondary | ICD-10-CM | POA: Insufficient documentation

## 2019-05-07 DIAGNOSIS — F17290 Nicotine dependence, other tobacco product, uncomplicated: Secondary | ICD-10-CM | POA: Diagnosis not present

## 2019-05-07 DIAGNOSIS — Z79899 Other long term (current) drug therapy: Secondary | ICD-10-CM | POA: Diagnosis not present

## 2019-05-07 HISTORY — PX: INGUINAL HERNIA REPAIR: SHX194

## 2019-05-07 HISTORY — DX: Unilateral inguinal hernia, without obstruction or gangrene, not specified as recurrent: K40.90

## 2019-05-07 SURGERY — REPAIR, HERNIA, INGUINAL, ADULT
Anesthesia: General | Site: Inguinal | Laterality: Left

## 2019-05-07 MED ORDER — 0.9 % SODIUM CHLORIDE (POUR BTL) OPTIME
TOPICAL | Status: DC | PRN
  Administered 2019-05-07: 1000 mL

## 2019-05-07 MED ORDER — EPHEDRINE 5 MG/ML INJ
INTRAVENOUS | Status: AC
  Filled 2019-05-07: qty 10

## 2019-05-07 MED ORDER — ONDANSETRON HCL 4 MG/2ML IJ SOLN
INTRAMUSCULAR | Status: AC
  Filled 2019-05-07: qty 6

## 2019-05-07 MED ORDER — HYDROMORPHONE HCL 1 MG/ML IJ SOLN: 0.2500 mg | INTRAMUSCULAR | Status: DC | PRN

## 2019-05-07 MED ORDER — MEPERIDINE HCL 25 MG/ML IJ SOLN: 6.2500 mg | INTRAMUSCULAR | Status: DC | PRN

## 2019-05-07 MED ORDER — PROPOFOL 10 MG/ML IV BOLUS
INTRAVENOUS | Status: DC | PRN
  Administered 2019-05-07: 150 mg via INTRAVENOUS

## 2019-05-07 MED ORDER — HYDROCODONE-ACETAMINOPHEN 5-325 MG PO TABS: 1.0000 | ORAL_TABLET | Freq: Four times a day (QID) | ORAL | 0 refills | Status: DC | PRN

## 2019-05-07 MED ORDER — MIDAZOLAM HCL 2 MG/2ML IJ SOLN: 0.5000 mg | Freq: Once | INTRAMUSCULAR | Status: DC | PRN

## 2019-05-07 MED ORDER — BUPIVACAINE-EPINEPHRINE 0.5% -1:200000 IJ SOLN
INTRAMUSCULAR | Status: DC | PRN
  Administered 2019-05-07: .1 mL

## 2019-05-07 MED ORDER — CHLORHEXIDINE GLUCONATE CLOTH 2 % EX PADS: 6.0000 | MEDICATED_PAD | Freq: Once | CUTANEOUS | Status: DC

## 2019-05-07 MED ORDER — ACETAMINOPHEN 500 MG PO TABS
ORAL_TABLET | ORAL | Status: AC
  Filled 2019-05-07: qty 2

## 2019-05-07 MED ORDER — DEXAMETHASONE SODIUM PHOSPHATE 10 MG/ML IJ SOLN
INTRAMUSCULAR | Status: DC | PRN
  Administered 2019-05-07: 10 mg via INTRAVENOUS

## 2019-05-07 MED ORDER — CEFAZOLIN SODIUM-DEXTROSE 2-4 GM/100ML-% IV SOLN
INTRAVENOUS | Status: AC
  Filled 2019-05-07: qty 100

## 2019-05-07 MED ORDER — LIDOCAINE 2% (20 MG/ML) 5 ML SYRINGE
INTRAMUSCULAR | Status: DC | PRN
  Administered 2019-05-07: 40 mg via INTRAVENOUS

## 2019-05-07 MED ORDER — CELECOXIB 200 MG PO CAPS
ORAL_CAPSULE | ORAL | Status: AC
  Filled 2019-05-07: qty 1

## 2019-05-07 MED ORDER — SODIUM CHLORIDE 0.9% FLUSH: 3.0000 mL | Freq: Two times a day (BID) | INTRAVENOUS | Status: DC

## 2019-05-07 MED ORDER — BUPIVACAINE-EPINEPHRINE (PF) 0.25% -1:200000 IJ SOLN
INTRAMUSCULAR | Status: AC
  Filled 2019-05-07: qty 30

## 2019-05-07 MED ORDER — FENTANYL CITRATE (PF) 250 MCG/5ML IJ SOLN
INTRAMUSCULAR | Status: DC | PRN
  Administered 2019-05-07: 50 ug via INTRAVENOUS

## 2019-05-07 MED ORDER — GABAPENTIN 300 MG PO CAPS
ORAL_CAPSULE | ORAL | Status: AC
  Filled 2019-05-07: qty 1

## 2019-05-07 MED ORDER — EPHEDRINE SULFATE 50 MG/ML IJ SOLN
INTRAMUSCULAR | Status: DC | PRN
  Administered 2019-05-07: 10 mg via INTRAVENOUS

## 2019-05-07 MED ORDER — CELECOXIB 200 MG PO CAPS
200.0000 mg | ORAL_CAPSULE | ORAL | Status: AC
  Administered 2019-05-07: 200 mg via ORAL

## 2019-05-07 MED ORDER — MIDAZOLAM HCL 2 MG/2ML IJ SOLN
INTRAMUSCULAR | Status: AC
  Filled 2019-05-07: qty 2

## 2019-05-07 MED ORDER — PHENYLEPHRINE 40 MCG/ML (10ML) SYRINGE FOR IV PUSH (FOR BLOOD PRESSURE SUPPORT)
PREFILLED_SYRINGE | INTRAVENOUS | Status: AC
  Filled 2019-05-07: qty 20

## 2019-05-07 MED ORDER — PROMETHAZINE HCL 25 MG/ML IJ SOLN: 6.2500 mg | INTRAMUSCULAR | Status: DC | PRN

## 2019-05-07 MED ORDER — LACTATED RINGERS IV SOLN: INTRAVENOUS | Status: DC

## 2019-05-07 MED ORDER — MIDAZOLAM HCL 2 MG/2ML IJ SOLN
INTRAMUSCULAR | Status: AC
  Administered 2019-05-07: 1 mg
  Filled 2019-05-07: qty 2

## 2019-05-07 MED ORDER — DEXAMETHASONE SODIUM PHOSPHATE 10 MG/ML IJ SOLN
INTRAMUSCULAR | Status: AC
  Filled 2019-05-07: qty 3

## 2019-05-07 MED ORDER — HYDROCODONE-ACETAMINOPHEN 5-325 MG PO TABS
ORAL_TABLET | ORAL | Status: AC
  Filled 2019-05-07: qty 2

## 2019-05-07 MED ORDER — MIDAZOLAM HCL 2 MG/2ML IJ SOLN
INTRAMUSCULAR | Status: DC | PRN
  Administered 2019-05-07 (×2): 1 mg via INTRAVENOUS

## 2019-05-07 MED ORDER — BUPIVACAINE-EPINEPHRINE (PF) 0.5% -1:200000 IJ SOLN
INTRAMUSCULAR | Status: DC | PRN
  Administered 2019-05-07: 30 mL

## 2019-05-07 MED ORDER — FENTANYL CITRATE (PF) 250 MCG/5ML IJ SOLN
INTRAMUSCULAR | Status: AC
  Filled 2019-05-07: qty 5

## 2019-05-07 MED ORDER — FENTANYL CITRATE (PF) 100 MCG/2ML IJ SOLN
INTRAMUSCULAR | Status: AC
  Administered 2019-05-07: 100 ug
  Filled 2019-05-07: qty 2

## 2019-05-07 MED ORDER — ACETAMINOPHEN 500 MG PO TABS
1000.0000 mg | ORAL_TABLET | ORAL | Status: AC
  Administered 2019-05-07: 1000 mg via ORAL

## 2019-05-07 MED ORDER — SODIUM CHLORIDE 0.9 % IV SOLN
INTRAVENOUS | Status: DC | PRN
  Administered 2019-05-07: 40 mL

## 2019-05-07 MED ORDER — LACTATED RINGERS IV SOLN
INTRAVENOUS | Status: DC | PRN
  Administered 2019-05-07: 10:00:00 via INTRAVENOUS

## 2019-05-07 MED ORDER — ONDANSETRON HCL 4 MG/2ML IJ SOLN
INTRAMUSCULAR | Status: DC | PRN
  Administered 2019-05-07: 4 mg via INTRAVENOUS

## 2019-05-07 MED ORDER — LIDOCAINE 2% (20 MG/ML) 5 ML SYRINGE
INTRAMUSCULAR | Status: AC
  Filled 2019-05-07: qty 15

## 2019-05-07 MED ORDER — PROPOFOL 10 MG/ML IV BOLUS
INTRAVENOUS | Status: AC
  Filled 2019-05-07: qty 20

## 2019-05-07 MED ORDER — GABAPENTIN 300 MG PO CAPS
300.0000 mg | ORAL_CAPSULE | ORAL | Status: AC
  Administered 2019-05-07: 300 mg via ORAL

## 2019-05-07 MED ORDER — HYDROCODONE-ACETAMINOPHEN 5-325 MG PO TABS
1.0000 | ORAL_TABLET | Freq: Four times a day (QID) | ORAL | Status: DC | PRN
  Administered 2019-05-07: 2 via ORAL

## 2019-05-07 MED ORDER — LACTATED RINGERS IV SOLN: INTRAVENOUS | Status: DC | PRN

## 2019-05-07 MED ORDER — CEFAZOLIN SODIUM-DEXTROSE 2-4 GM/100ML-% IV SOLN
2.0000 g | INTRAVENOUS | Status: AC
  Administered 2019-05-07: 2 g via INTRAVENOUS

## 2019-05-07 SURGICAL SUPPLY — 38 items
BLADE CLIPPER SURG (BLADE) IMPLANT
CANISTER SUCT 3000ML PPV (MISCELLANEOUS) ×2 IMPLANT
CHLORAPREP W/TINT 26 (MISCELLANEOUS) ×2 IMPLANT
COVER SURGICAL LIGHT HANDLE (MISCELLANEOUS) ×2 IMPLANT
COVER WAND RF STERILE (DRAPES) ×2 IMPLANT
DERMABOND ADVANCED (GAUZE/BANDAGES/DRESSINGS) ×1
DERMABOND ADVANCED .7 DNX12 (GAUZE/BANDAGES/DRESSINGS) ×1 IMPLANT
DRAIN PENROSE 1/2X12 LTX STRL (WOUND CARE) IMPLANT
DRAPE LAPAROTOMY TRNSV 102X78 (DRAPES) ×2 IMPLANT
ELECT REM PT RETURN 9FT ADLT (ELECTROSURGICAL) ×2
ELECTRODE REM PT RTRN 9FT ADLT (ELECTROSURGICAL) ×1 IMPLANT
GLOVE EUDERMIC 7 POWDERFREE (GLOVE) ×2 IMPLANT
GOWN STRL REUS W/ TWL LRG LVL3 (GOWN DISPOSABLE) ×1 IMPLANT
GOWN STRL REUS W/ TWL XL LVL3 (GOWN DISPOSABLE) ×1 IMPLANT
GOWN STRL REUS W/TWL LRG LVL3 (GOWN DISPOSABLE) ×1
GOWN STRL REUS W/TWL XL LVL3 (GOWN DISPOSABLE) ×1
KIT BASIN OR (CUSTOM PROCEDURE TRAY) ×2 IMPLANT
KIT TURNOVER KIT B (KITS) ×2 IMPLANT
MESH ULTRAPRO 3X6 7.6X15CM (Mesh General) ×1 IMPLANT
NDL HYPO 25GX1X1/2 BEV (NEEDLE) ×1 IMPLANT
NEEDLE HYPO 25GX1X1/2 BEV (NEEDLE) ×2 IMPLANT
NS IRRIG 1000ML POUR BTL (IV SOLUTION) ×2 IMPLANT
PACK GENERAL/GYN (CUSTOM PROCEDURE TRAY) ×2 IMPLANT
PAD ARMBOARD 7.5X6 YLW CONV (MISCELLANEOUS) ×2 IMPLANT
PENCIL SMOKE EVACUATOR (MISCELLANEOUS) ×2 IMPLANT
SUT MNCRL AB 4-0 PS2 18 (SUTURE) ×2 IMPLANT
SUT PROLENE 2 0 CT2 30 (SUTURE) ×6 IMPLANT
SUT SILK 2 0 (SUTURE) ×1
SUT SILK 2 0 SH (SUTURE) IMPLANT
SUT SILK 2-0 18XBRD TIE 12 (SUTURE) ×1 IMPLANT
SUT VIC AB 2-0 BRD 54 (SUTURE) ×2 IMPLANT
SUT VIC AB 2-0 CT1 27 (SUTURE) ×1
SUT VIC AB 2-0 CT1 TAPERPNT 27 (SUTURE) ×1 IMPLANT
SUT VIC AB 3-0 SH 27 (SUTURE) ×1
SUT VIC AB 3-0 SH 27XBRD (SUTURE) ×1 IMPLANT
SYR CONTROL 10ML LL (SYRINGE) ×2 IMPLANT
TOWEL GREEN STERILE FF (TOWEL DISPOSABLE) ×2 IMPLANT
TOWEL OR 17X26 10 PK STRL BLUE (TOWEL DISPOSABLE) ×2 IMPLANT

## 2019-05-07 NOTE — Transfer of Care (Signed)
Immediate Anesthesia Transfer of Care Note  Patient: Jeffery Thomas  Procedure(s) Performed: OPEN REPAIR LEFT INGUINAL HERNIA WITH MESH (Left Inguinal)  Patient Location: PACU  Anesthesia Type:General and Regional  Level of Consciousness: sedated  Airway & Oxygen Therapy: Patient Spontanous Breathing and Patient connected to nasal cannula oxygen  Post-op Assessment: Report given to RN and Post -op Vital signs reviewed and stable  Post vital signs: Reviewed and stable  Last Vitals:  Vitals Value Taken Time  BP 130/82 05/07/19 1223  Temp 36.3 C 05/07/19 1223  Pulse 50 05/07/19 1230  Resp 12 05/07/19 1230  SpO2 100 % 05/07/19 1230  Vitals shown include unvalidated device data.  Last Pain:  Vitals:   05/07/19 1223  TempSrc:   PainSc: 0-No pain         Complications: No apparent anesthesia complications

## 2019-05-07 NOTE — Anesthesia Procedure Notes (Signed)
Anesthesia Regional Block: TAP block   Pre-Anesthetic Checklist: ,, timeout performed, Correct Patient, Correct Site, Correct Laterality, Correct Procedure, Correct Position, site marked, Risks and benefits discussed,  Surgical consent,  Pre-op evaluation,  At surgeon's request and post-op pain management  Laterality: Left  Prep: chloraprep       Needles:  Injection technique: Single-shot  Needle Type: Echogenic Needle     Needle Length: 9cm  Needle Gauge: 21     Additional Needles:   Procedures:,,,, ultrasound used (permanent image in chart),,,,  Narrative:  Start time: 05/07/2019 9:54 AM End time: 05/07/2019 10:03 AM Injection made incrementally with aspirations every 5 mL.  Performed by: Personally  Anesthesiologist: Annye Asa, MD  Additional Notes: Pt identified in Holding room.  Monitors applied. Working IV access confirmed. Sterile prep, drape L flank.  #21ga ECHOgenic needle into TAP with US guidance.  30cc 0.5% Bupivacaine with 1:200k epi injected incrementally after negative test dose.  Patient asymptomatic, VSS, no heme aspirated, tolerated well.  Jenita Seashore, MD

## 2019-05-07 NOTE — Interval H&P Note (Signed)
History and Physical Interval Note:  05/07/2019 9:10 AM  Jeffery Thomas  has presented today for surgery, with the diagnosis of left inguinal hernia.  The various methods of treatment have been discussed with the patient and family. After consideration of risks, benefits and other options for treatment, the patient has consented to  Procedure(s): OPEN REPAIR LEFT INGUINAL HERNIA WITH MESH (Left) as a surgical intervention.  The patient's history has been reviewed, patient examined, no change in status, stable for surgery.  I have reviewed the patient's chart and labs.  Questions were answered to the patient's satisfaction.     Adin Hector

## 2019-05-07 NOTE — Discharge Instructions (Signed)
[facility] _____[facility]   INGUINAL HERNIA REPAIR: POST OP INSTRUCTIONS  Always review your discharge instruction sheet given to you by the facility where your surgery was performed. IF YOU HAVE DISABILITY OR FAMILY LEAVE FORMS, YOU MUST BRING THEM TO THE OFFICE FOR PROCESSING.   DO NOT GIVE THEM TO YOUR DOCTOR.  1. A  prescription for pain medication may be given to you upon discharge.  Take your pain medication as prescribed, if needed.  If narcotic pain medicine is not needed, then you may take acetaminophen (Tylenol) or ibuprofen (Advil) as needed. 2. Take your usually prescribed medications unless otherwise directed. If you need a refill on your pain medication, please contact your pharmacy.  They will contact our office to request authorization. Prescriptions will not be filled after 5 pm or on week-ends. 3. You should follow a light diet the first 24 hours after arrival home, such as soup and crackers, etc.  Be sure to include lots of fluids daily.  Resume your normal diet the day after surgery. 4.Most patients will experience some swelling and bruising in the groin and scrotum.  Ice packs and reclining will help.  Swelling and bruising can take several days to resolve.  6. It is common to experience some constipation if taking pain medication after surgery.  Increasing fluid intake and taking a stool softener (such as Colace) will usually help or prevent this problem from occurring.  A mild laxative (Milk of Magnesia or Miralax) should be taken according to package directions if there are no bowel movements after 48 hours. 7. Unless discharge instructions indicate otherwise, you may remove your bandages 24-48 hours after surgery, and you may shower at that time.  You may have steri-strips (small skin tapes) in place directly over the incision.  These strips should be left on the skin for 7-10 days.  If your surgeon used skin glue on the incision, you may shower in 24 hours.  The  glue will flake off over the next 2-3 weeks.  Any sutures or staples will be removed at the office during your follow-up visit. 8. ACTIVITIES:  You may resume regular (light) daily activities beginning the next day--such as daily self-care, walking, climbing stairs--gradually increasing activities as tolerated.  You may have sexual intercourse when it is comfortable.  Refrain from any heavy lifting or straining until approved by your doctor.  a.You may drive when you are no longer taking prescription pain medication, you can comfortably wear a seatbelt, and you can safely maneuver your car and apply brakes. b.RETURN TO WORK:   _____________________________________________  9.You should see your doctor in the office for a follow-up appointment approximately 2-3 weeks after your surgery.  Make sure that you call for this appointment within a day or two after you arrive home to insure a convenient appointment time. 10.OTHER INSTRUCTIONS: _________________________    _____________________________________  WHEN TO CALL YOUR DOCTOR: 1. Fever over 101.0 2. Inability to urinate 3. Nausea and/or vomiting 4. Extreme swelling or bruising 5. Continued bleeding from incision. 6. Increased pain, redness, or drainage from the incision  The clinic staff is available to answer your questions during regular business hours.  Please dont hesitate to call and ask to speak to one of the nurses for clinical concerns.  If you have a medical emergency, go to the nearest emergency room or call 911.  A surgeon from [facility] is always on call at the hospital   [address], [employer]  [address], [place], [place]  [address] ?  [address], [place], [place]   [address] [phone] ? [phone] ? FAX [phone] Web site: [email or web address]            Managing Your Pain After Surgery Without Opioids    Thank you for participating in our program to help patients  manage their pain after surgery without opioids. This is part of our effort to provide you with the best care possible, without exposing you or your family to the risk that opioids pose.  What pain can I expect after surgery? You can expect to have some pain after surgery. This is normal. The pain is typically worse the day after surgery, and quickly begins to get better. Many studies have found that many patients are able to manage their pain after surgery with Over-the-Counter (OTC) medications such as Tylenol and Motrin. If you have a condition that does not allow you to take Tylenol or Motrin, notify your surgical team.  How will I manage my pain? The best strategy for controlling your pain after surgery is around the clock pain control with Tylenol (acetaminophen) and Motrin (ibuprofen or Advil). Alternating these medications with each other allows you to maximize your pain control. In addition to Tylenol and Motrin, you can use heating pads or ice packs on your incisions to help reduce your pain.  How will I alternate your regular strength over-the-counter pain medication? You will take a dose of pain medication every three hours. ; Start by taking 650 mg of Tylenol (2 pills of 325 mg) ; 3 hours later take 600 mg of Motrin (3 pills of 200 mg) ; 3 hours after taking the Motrin take 650 mg of Tylenol ; 3 hours after that take 600 mg of Motrin.   - 1 -  See example - if your first dose of Tylenol is at 12:00 PM   12:00 PM Tylenol 650 mg (2 pills of 325 mg)  3:00 PM Motrin 600 mg (3 pills of 200 mg)  6:00 PM Tylenol 650 mg (2 pills of 325 mg)  9:00 PM Motrin 600 mg (3 pills of 200 mg)  Continue alternating every 3 hours   We recommend that you follow this schedule around-the-clock for at least 3 days after surgery, or until you feel that it is no longer needed. Use the table on the last page of this handout to keep track of the medications you are taking. Important: Do not take more  than 3000mg  of Tylenol or 3200mg  of Motrin in a 24-hour period. Do not take ibuprofen/Motrin if you have a history of bleeding stomach ulcers, severe kidney disease, &/or actively taking a blood thinner  What if I still have pain? If you have pain that is not controlled with the over-the-counter pain medications (Tylenol and Motrin or Advil) you might have what we call breakthrough pain. You will receive a prescription for a small amount of an opioid pain medication such as Oxycodone, Tramadol, or Tylenol with Codeine. Use these opioid pills in the first 24 hours after surgery if you have breakthrough pain. Do not take more than 1 pill every 4-6 hours.  If you still have uncontrolled pain after using all opioid pills, don't hesitate to call our staff using the number provided. We will help make sure you are managing your pain in the best way possible, and if necessary, we can provide a prescription for additional pain medication.   Day 1    Time  Name of Medication Number of pills taken  Amount of Acetaminophen  Pain Level   Comments  AM PM       AM PM       AM PM       AM PM       AM PM       AM PM       AM PM       AM PM       Total Daily amount of Acetaminophen Do not take more than  3,000 mg per day      Day 2    Time  Name of Medication Number of pills taken  Amount of Acetaminophen  Pain Level   Comments  AM PM       AM PM       AM PM       AM PM       AM PM       AM PM       AM PM       AM PM       Total Daily amount of Acetaminophen Do not take more than  3,000 mg per day      Day 3    Time  Name of Medication Number of pills taken  Amount of Acetaminophen  Pain Level   Comments  AM PM       AM PM       AM PM       AM PM          AM PM       AM PM       AM PM       AM PM       Total Daily amount of Acetaminophen Do not take more than  3,000 mg per day      Day 4    Time  Name of Medication Number of pills taken  Amount of  Acetaminophen  Pain Level   Comments  AM PM       AM PM       AM PM       AM PM       AM PM       AM PM       AM PM       AM PM       Total Daily amount of Acetaminophen Do not take more than  3,000 mg per day      Day 5    Time  Name of Medication Number of pills taken  Amount of Acetaminophen  Pain Level   Comments  AM PM       AM PM       AM PM       AM PM       AM PM       AM PM       AM PM       AM PM       Total Daily amount of Acetaminophen Do not take more than  3,000 mg per day       Day 6    Time  Name of Medication Number of pills taken  Amount of Acetaminophen  Pain Level  Comments  AM PM       AM PM       AM PM       AM PM       AM  PM       AM PM       AM PM       AM PM       Total Daily amount of Acetaminophen Do not take more than  3,000 mg per day      Day 7    Time  Name of Medication Number of pills taken  Amount of Acetaminophen  Pain Level   Comments  AM PM       AM PM       AM PM       AM PM       AM PM       AM PM       AM PM       AM PM       Total Daily amount of Acetaminophen Do not take more than  3,000 mg per day        For additional information about how and where to safely dispose of unused opioid medications - [employer]  Disclaimer: This document contains information and/or instructional materials adapted from [place] for the typical patient with your condition. It does not replace medical advice from your health care provider because your experience may differ from that of the typical patient. Talk to your health care provider if you have any questions about this document, your condition or your treatment plan. Adapted from [place]

## 2019-05-07 NOTE — Anesthesia Procedure Notes (Signed)
Procedure Name: LMA Insertion Date/Time: 05/07/2019 11:06 AM Performed by: Clearnce Sorrel, CRNA Pre-anesthesia Checklist: Patient identified, Emergency Drugs available, Suction available, Patient being monitored and Timeout performed Patient Re-evaluated:Patient Re-evaluated prior to induction Oxygen Delivery Method: Circle system utilized Preoxygenation: Pre-oxygenation with 100% oxygen Induction Type: IV induction LMA: LMA inserted LMA Size: 4.0 Number of attempts: 1 Placement Confirmation: positive ETCO2 and breath sounds checked- equal and bilateral Tube secured with: Tape Dental Injury: Teeth and Oropharynx as per pre-operative assessment

## 2019-05-07 NOTE — Op Note (Signed)
Patient Name:           Jeffery Thomas   Date of Surgery:        05/07/2019  Pre op Diagnosis:      Left inguinal hernia  Post op Diagnosis:    Left inguinal hernia  Procedure:                 Open repair left inguinal hernia, implantation of mesh, Karl Pock repair)  Surgeon:                     Edsel Petrin. Dalbert Batman, M.D., FACS  Assistant:                      OR staff  Operative Indications:   This is a pleasant, 68 year old man, referred by Dr. Festus Aloe of [facility] for a preop evaluation of left inguinal hernia .  He recently underwent repair of scrotal hydrocele and has done well from that. Luetta Nutting, D.O. provides primary care. Quay Burow is his cardiologist.      Dr. Junious Silk examined him and felt a small inguinal hernia above the hydrocele. He doesn't have any classic symptoms for hernia and was not aware of this but he does have a small reducible left inguinal hernia and a 5 cm hydrocele.     Comorbidities include myocardial infarction with cardiac arrest August 2019. He had CPR and was placed in hypothermic recovery and recovered. He has internal defibrillator on the left side placed by Dr. Rayann Heman. He has done well and is back to playing golf several times a week. He takes an aspirin daily. Hypertension. Left total knee replacement 2. Internal defibrillator.  Social history revealsSmokes cigars when he plays golf. Quit alcohol intake 9 months ago. 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      I told him he had a left inguinal hernia. Not immediately threatening to him but if he is going to have the hydrocele repaired he might as well have the hernia repaired due to the natural history of enlargement and pain and low risk but definite risk of incarceration. He very much wants to have both done Dr. Junious Silk repaired his hydrocele 3 or 4 weeks ago and he has completely recovered from that.  He is brought to the  operating room for elective repair of his left inguinal hernia  Operative Findings:       He had a moderate sized but obvious direct left inguinal hernia.  He did not have any evidence of indirect hernia.  This was repaired with a 3 inch x 6 inch piece of ultra pro mesh.  The surgery was uneventful  Procedure in Detail:          Following the induction of general LMA anesthesia the patient's abdomen and genitalia were prepped and draped in a sterile fashion.  Intravenous antibiotics were given.  Surgical timeout was performed.     TA PP block was performed preop.  I injected a 50% solution of Exparel into all of the tissue layers for prolonged local anesthesia.      A transverse incision was made in the left groin overlying the left inguinal canal.  Dissection was carried down through the subcutaneous tissue, exposing the aponeurosis of the external oblique.  The external oblique was incised in the direction of its fibers, opening up the external inguinal ring.  The external oblique was dissected away  from the underlying tissues and self-retaining retractors were placed.  The cord structures were mobilized and encircled with a Penrose drain.  Some cremasteric muscle fibers were skeletonized.  I dissected the direct hernia bulge away from the cord structures.  The direct bulge was inspected and then reduced and oversewn with a running suture of 2-0 Vicryl which held it reduced during the repair.  The cord structures were inspected carefully and I did not find any evidence of indirect hernia.     The floor of the inguinal canal was repaired and reinforced with an onlay graft of ultra pro mesh, 3 inch x 6 inches.  The mesh was trimmed at the corners to accommodate the anatomy of the inguinal floor.  The mesh was sutured in place with running sutures of 2-0 Prolene and interrupted mattress sutures of 2-0 Prolene.  The mesh was sutured so as to generously overlap the fascia at the pubic tubercle, then along the  inguinal ligament inferiorly.  Medially, superiorly, and superior laterally several mattress sutures of Prolene were placed.  The mesh was incised laterally so as to wrap around the cord structures at the internal ring.  Further Prolene sutures were placed laterally to complete the repair.  This provided secure coverage both medial and lateral to the internal ring but allowed an adequate fingertip opening for the cord structures.  Hemostasis was excellent.  The wound was irrigated.  The external oblique was closed with a running suture of 2-0 Vicryl placing the cord structures deep to the external oblique.  Scarpa's fascia was closed with 3-0 Vicryl sutures and the skin closed with subcuticular 4-0 Monocryl and Dermabond.  Patient tolerated the procedure well and was taken to PACU in stable condition.  EBL 10 cc.  Counts correct.  Complications none.   Addendum: I logged onto the PMP aware website and reviewed his prescription medication history.     Edsel Petrin. Dalbert Batman, M.D., FACS General and Minimally Invasive Surgery Breast and Colorectal Surgery  05/07/2019 12:13 PM

## 2019-05-07 NOTE — Anesthesia Postprocedure Evaluation (Signed)
Anesthesia Post Note  Patient: Jeffery Thomas  Procedure(s) Performed: OPEN REPAIR LEFT INGUINAL HERNIA WITH MESH (Left Inguinal)     Patient location during evaluation: PACU Anesthesia Type: General and Regional Level of consciousness: awake and alert, patient cooperative and oriented Pain management: pain level controlled Vital Signs Assessment: post-procedure vital signs reviewed and stable Respiratory status: spontaneous breathing, nonlabored ventilation and respiratory function stable Cardiovascular status: blood pressure returned to baseline and stable Postop Assessment: no apparent nausea or vomiting Anesthetic complications: no    Last Vitals:  Vitals:   05/07/19 1223 05/07/19 1237  BP: 130/82 134/85  Pulse: (!) 50 (!) 54  Resp: 11 12  Temp: (!) 36.3 C   SpO2: 100% 100%    Last Pain:  Vitals:   05/07/19 1223  TempSrc:   PainSc: 0-No pain                 Anitha Kreiser,E. Brinda Focht

## 2019-05-08 ENCOUNTER — Encounter (HOSPITAL_COMMUNITY): Payer: Self-pay | Admitting: General Surgery

## 2019-05-15 IMAGING — DX DG CHEST 1V PORT
1 series · 1 of 1 positions shown · non-contrast
Comparison: None.

CLINICAL DATA: 66 y/o  M; post CPR and intubation.

EXAM:
PORTABLE CHEST 1 VIEW

[chest ap]
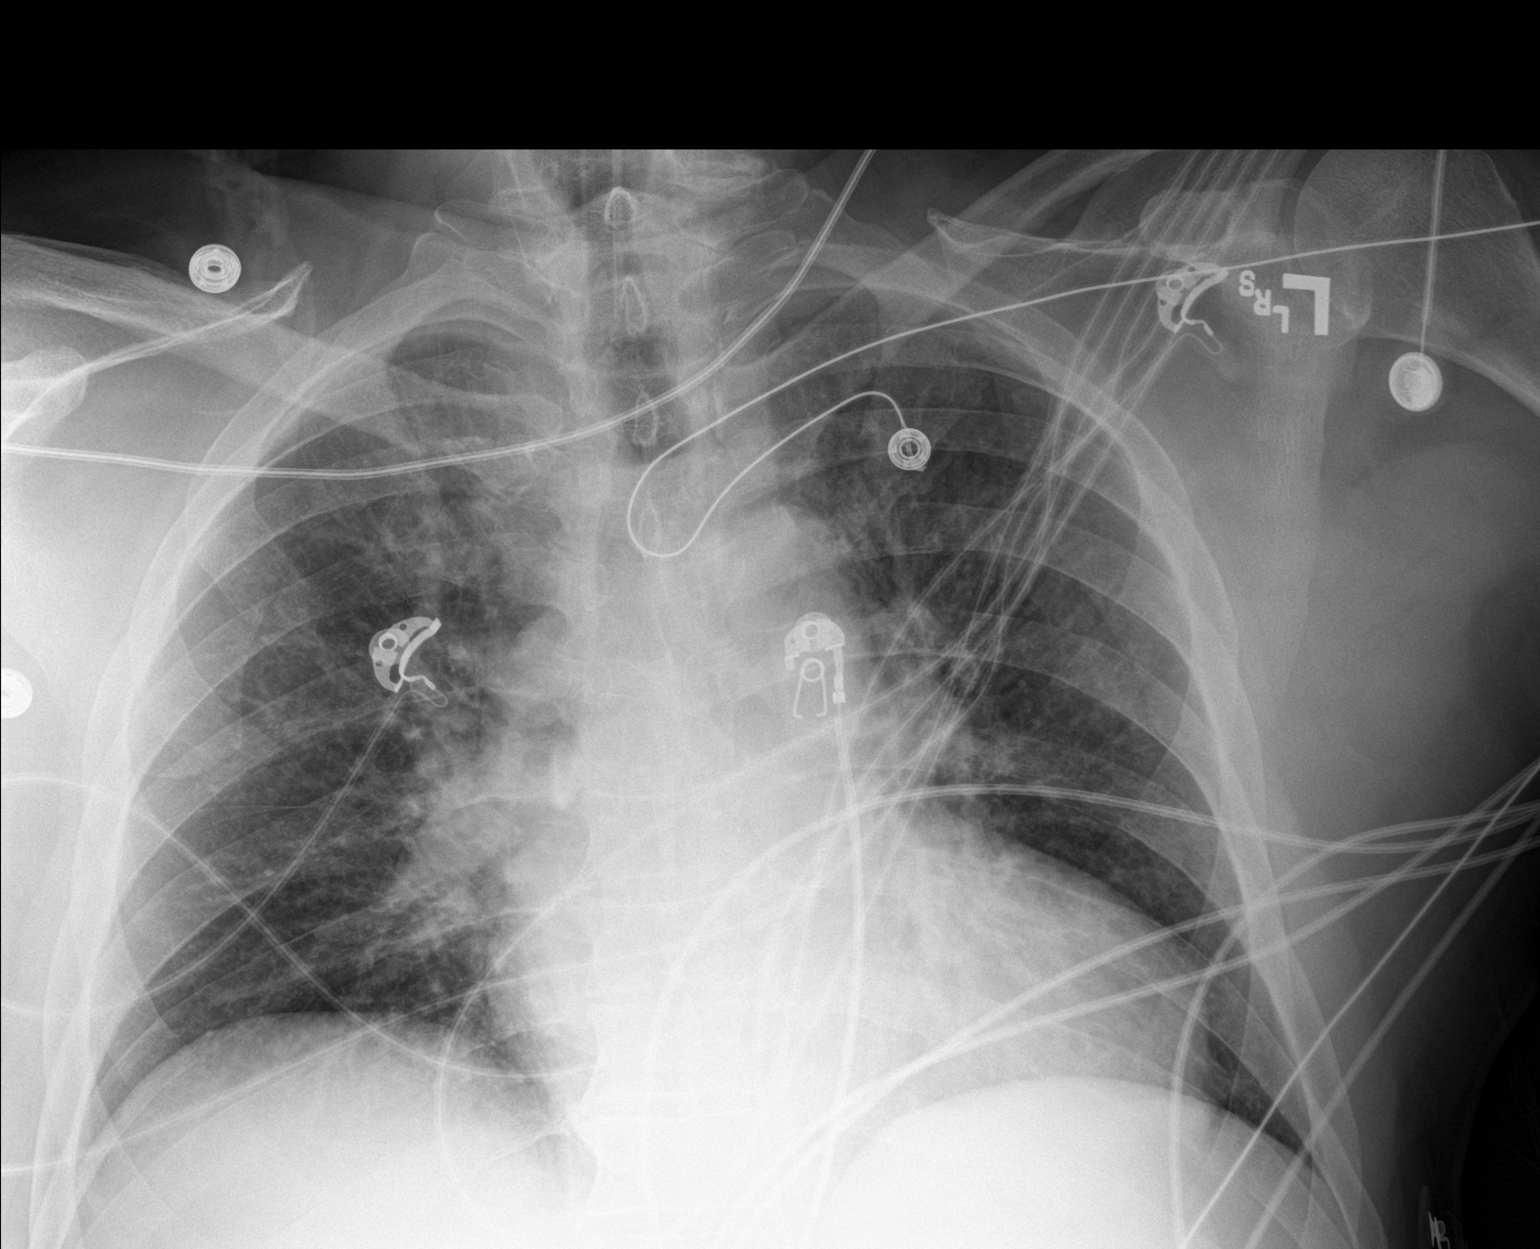

[1 of 1 positions shown; findings below may reference images not displayed]

FINDINGS: Mildly enlarged cardiac silhouette given projection and technique.
Endotracheal tube tip is 9.5 cm from the carina. Pulmonary vascular
congestion. No pneumothorax. The no focal consolidation. No acute
osseous abnormality is evident.
IMPRESSION: Mildly enlarged cardiac silhouette. Pulmonary vascular congestion.
Endotracheal tube tip 9.5 cm from carina, consider advancement.

By: Gott Car Almay M.D.

## 2019-05-15 NOTE — Progress Notes (Signed)
No ICM remote transmission received for 05/13/2019 and next ICM transmission scheduled for 06/10/2019.

## 2019-05-16 IMAGING — DX DG ABD PORTABLE 1V
1 series · 1 of 1 positions shown · non-contrast
Comparison: None.

CLINICAL DATA: Check gastric catheter placement

EXAM:
PORTABLE ABDOMEN - 1 VIEW

[abdomen kub]
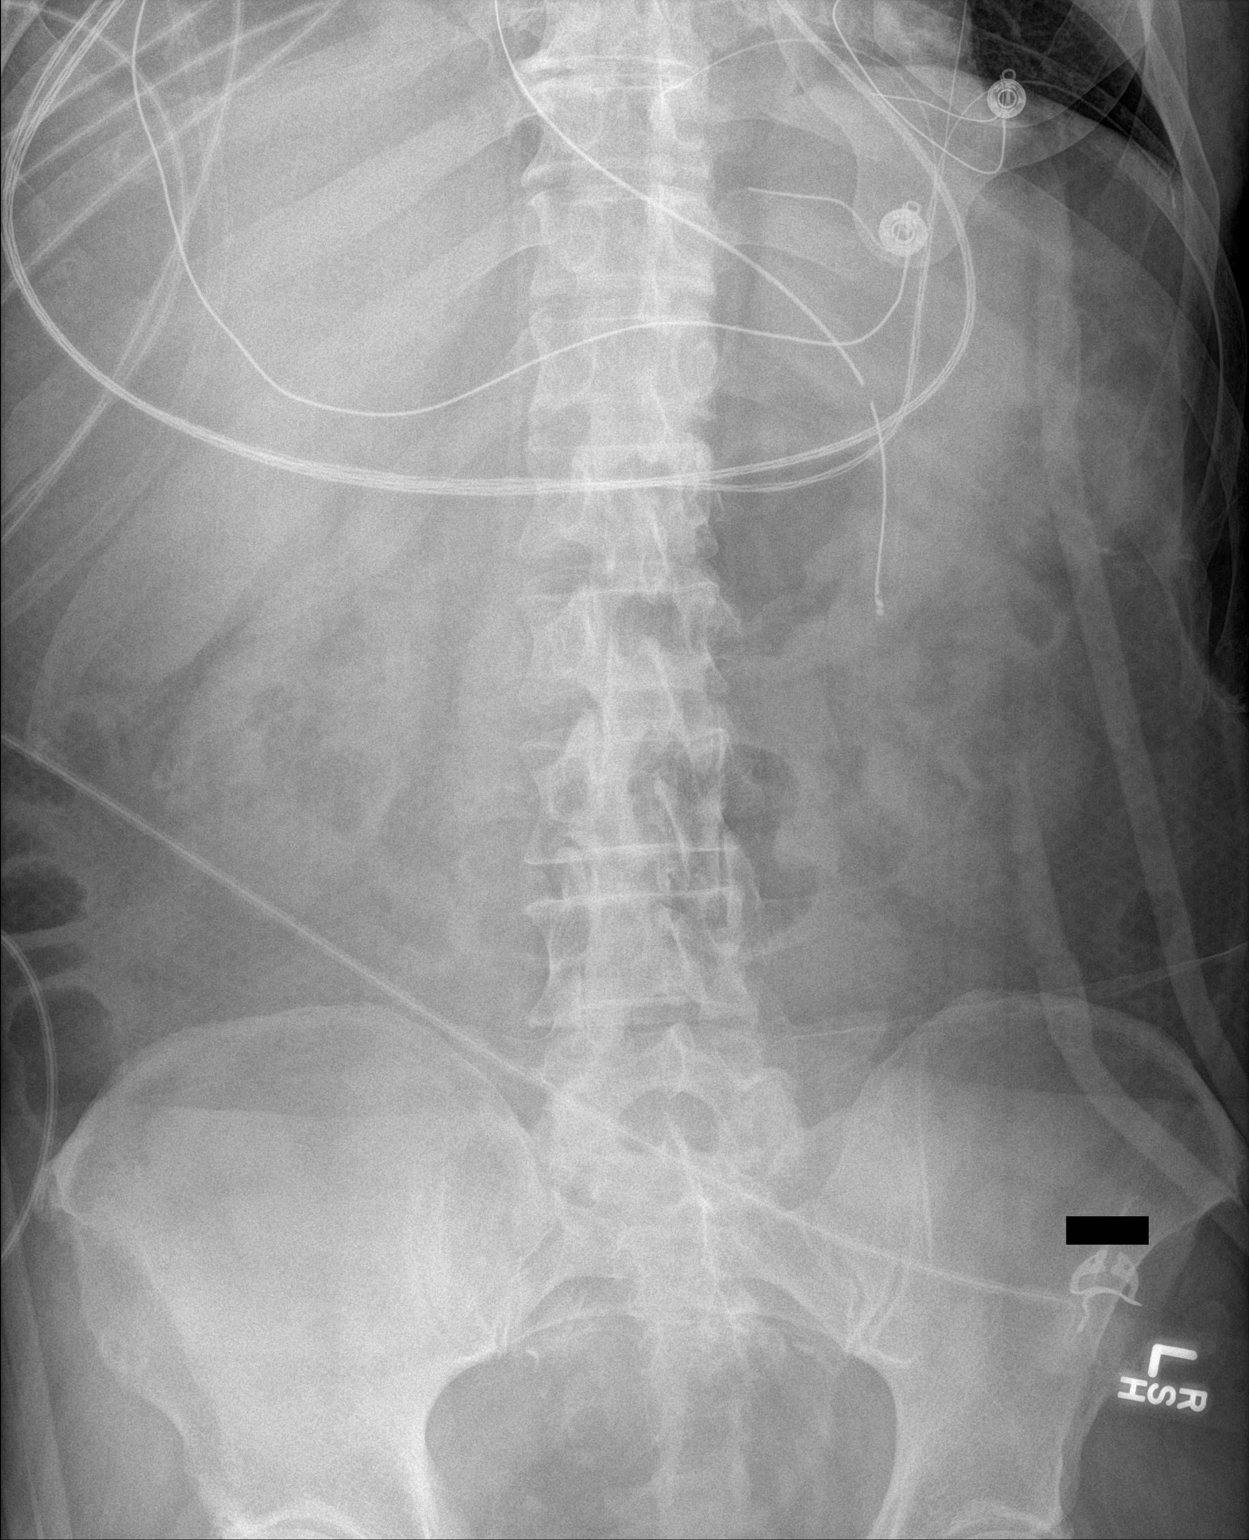

[1 of 1 positions shown; findings below may reference images not displayed]

FINDINGS: Scattered large and small bowel gas is noted. A gastric catheter is
noted within the stomach. No acute bony abnormality is seen. No mass
lesion is noted.
IMPRESSION: Gastric catheter within the stomach.

## 2019-05-16 IMAGING — DX DG CHEST 1V PORT
1 series · 1 of 1 positions shown · non-contrast
Comparison: 06/26/2017

CLINICAL DATA: Central line placement

EXAM:
PORTABLE CHEST 1 VIEW

[chest]
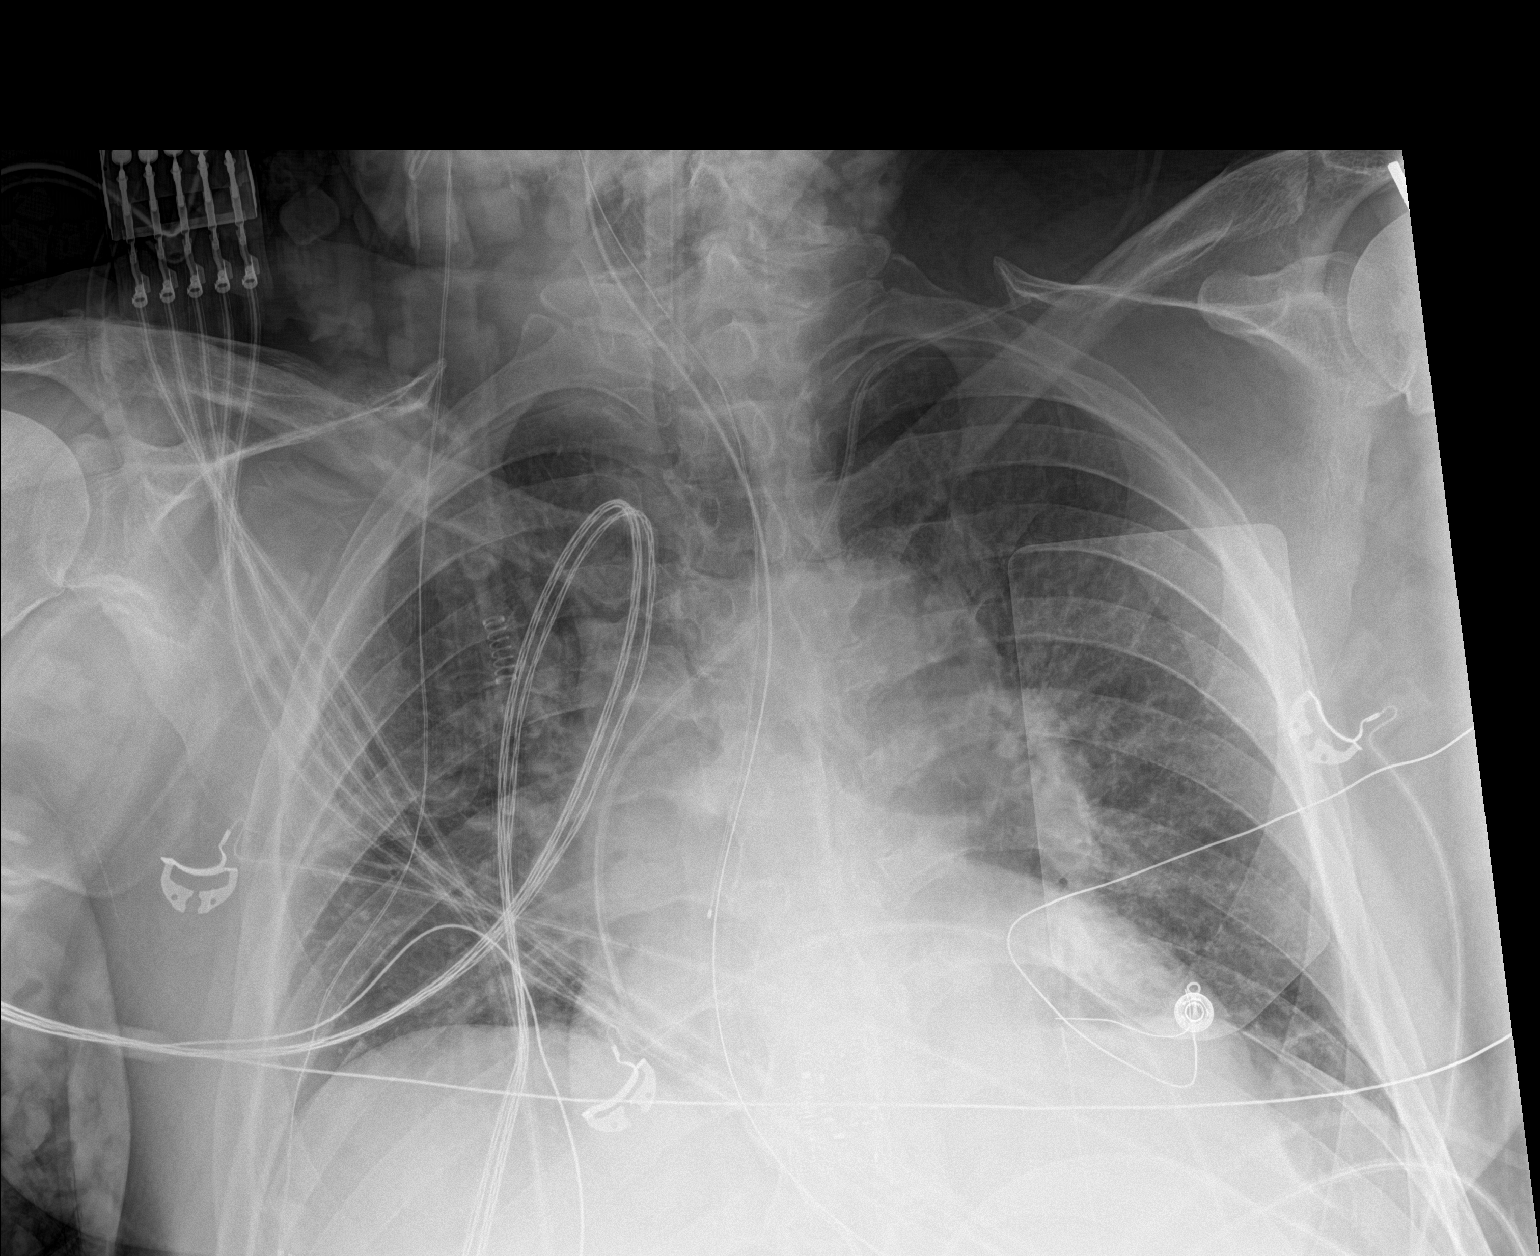

[1 of 1 positions shown; findings below may reference images not displayed]

FINDINGS: New left IJ central line catheter tip is seen at the cavoatrial
junction without pneumothorax. Low lung volumes with interstitial
edema and cardiomegaly. Tortuous thoracic aorta. Endotracheal tube
tip is approximately 8.5 cm above the carina. Gastric tube extends
below the left hemidiaphragm. A pH probe is seen in the distal
esophagus. There appear to be ice packs project over the base of the
neck and right arm.
IMPRESSION: 1. Left IJ central line catheter at the cavoatrial junction. No
pneumothorax.
2. Satisfactory support line and tube positions.
3. Stable cardiomegaly with mild interstitial edema.
4. Aortic atherosclerosis.

## 2019-05-17 IMAGING — CT CT HEAD W/O CM
4 of 7 series · 15 of 47 positions shown, 18 images · non-contrast
Comparison: None.

CLINICAL DATA: Cardiac arrest [REDACTED], ACLS time 20 minutes.

EXAM:
CT HEAD WITHOUT CONTRAST
CT CERVICAL SPINE WITHOUT CONTRAST
TECHNIQUE: Multidetector CT imaging of the head and cervical spine was
performed following the standard protocol without intravenous
contrast. Multiplanar CT image reconstructions of the cervical spine
were also generated.

[Series 4: head 5.0 h30s · axial · 0.43mm/px · 1 of 35 slices shown]
[im 9/35  brain]
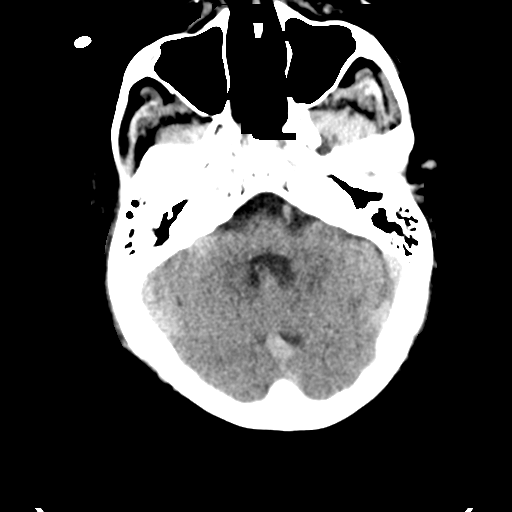

[Series 6: head 3.0 mpr cor · coronal · 0.33mm/px · 3 of 69 slices shown]
[im 18/69  brain]
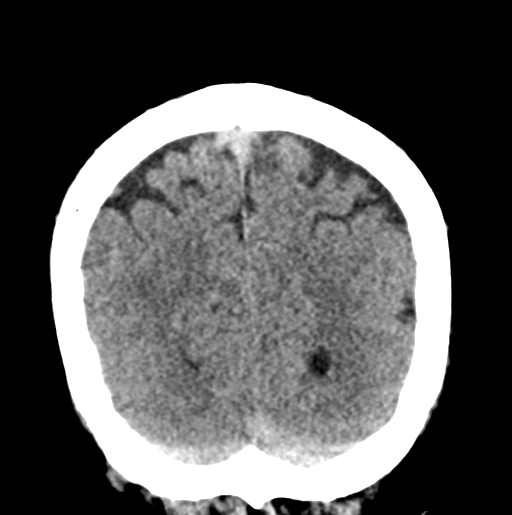
[im 35/69  brain]
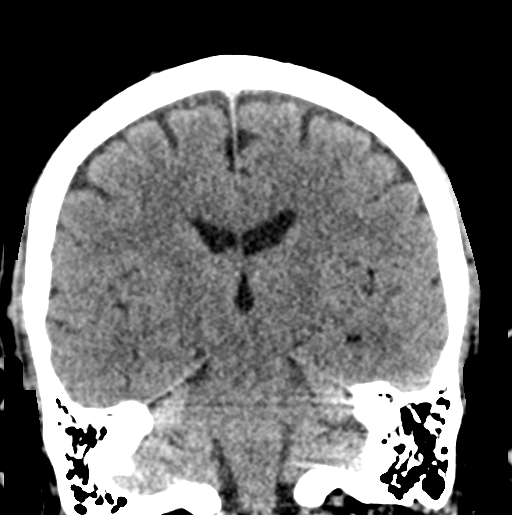
[im 52/69  brain]
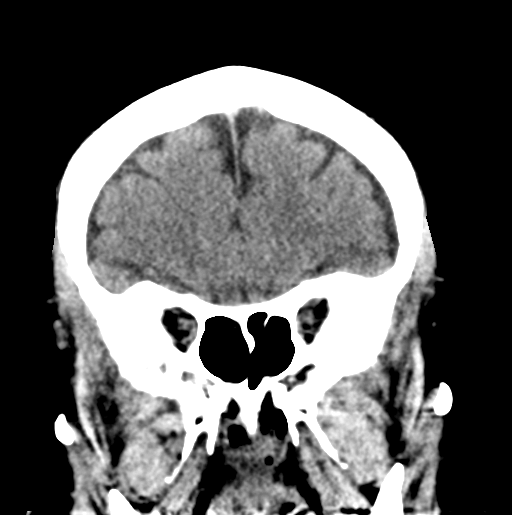

[Series 7: head 3.0 mpr sag · sagittal · 0.33mm/px · 1 of 56 slices shown]
[im 28/56  brain]
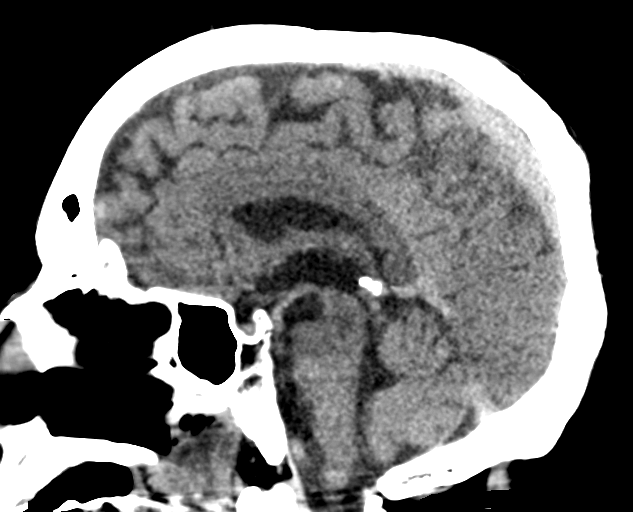

[Series 13: orthogonal axial st · axial · 0.21mm/px · z∈[-354,-210]mm · 10 of 94 slices shown, 13 images]
[im 9/94  brain]
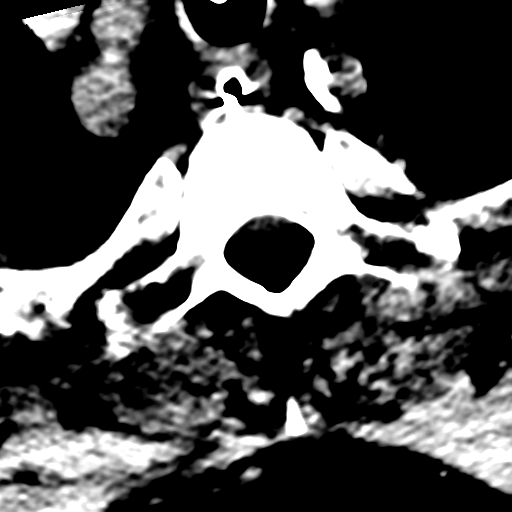
[im 9/94  bone]
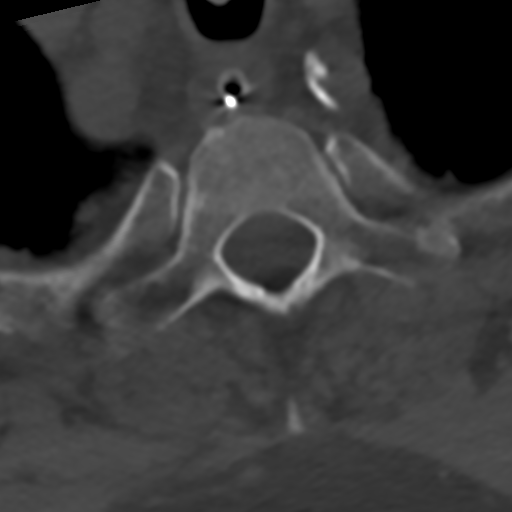
[im 17/94  brain]
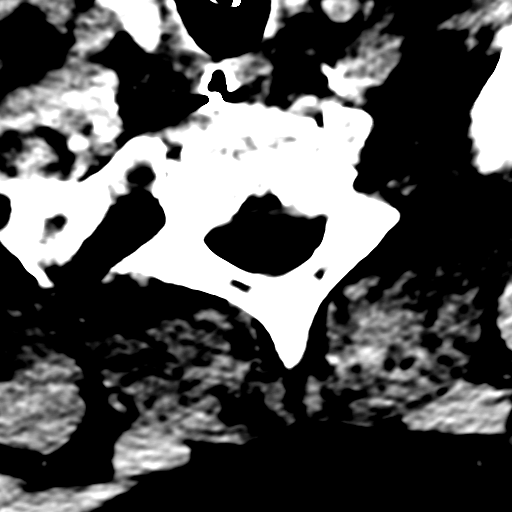
[im 26/94  brain]
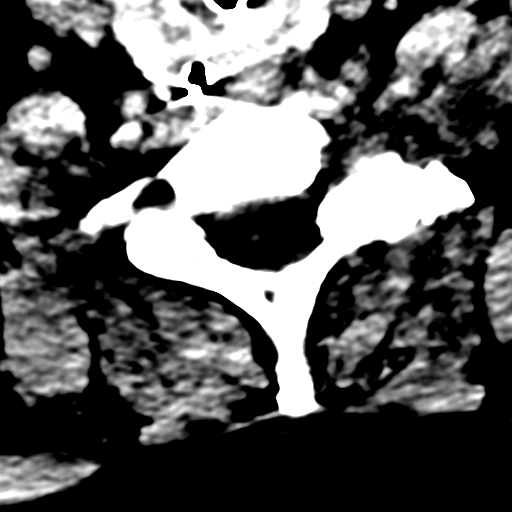
[im 34/94  brain]
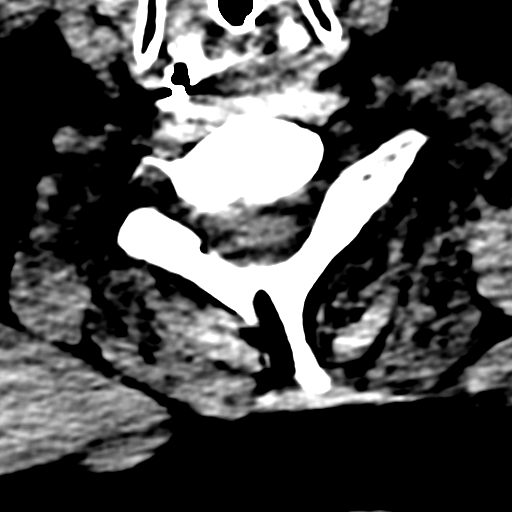
[im 43/94  brain]
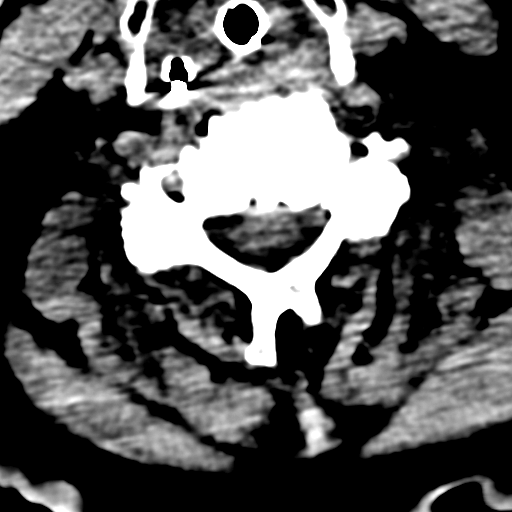
[im 43/94  bone]
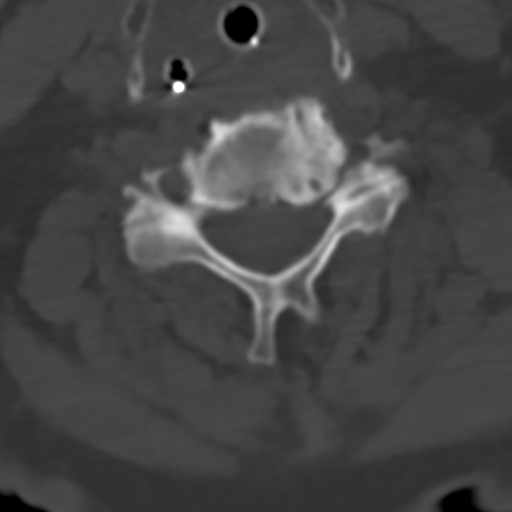
[im 51/94  brain]
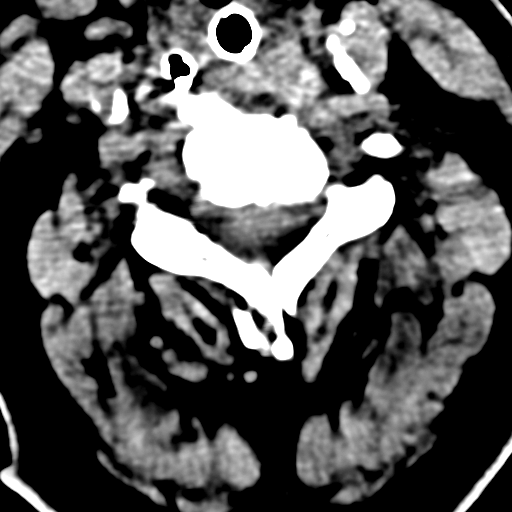
[im 60/94  brain]
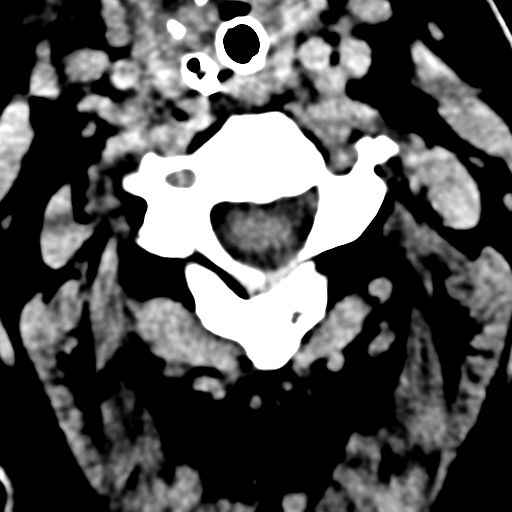
[im 68/94  brain]
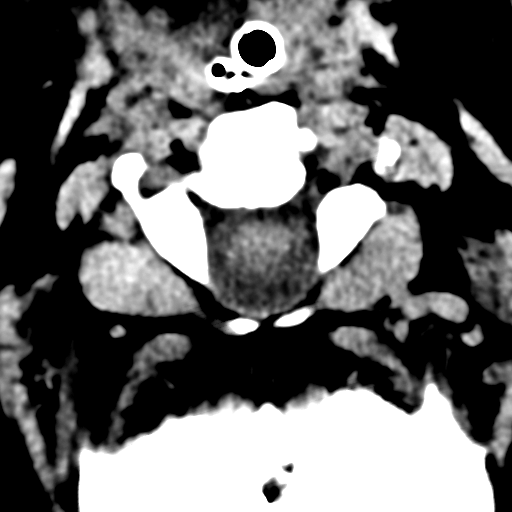
[im 77/94  brain]
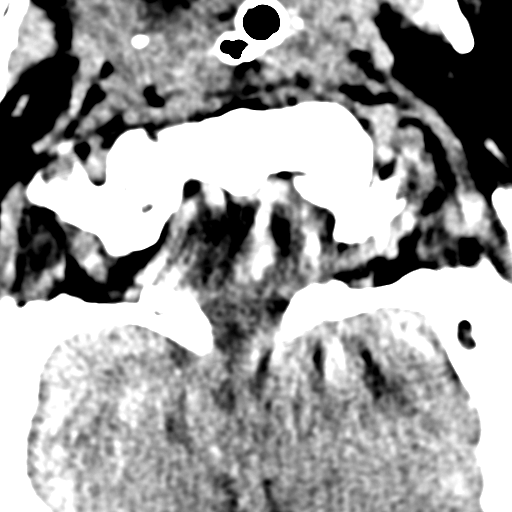
[im 77/94  bone]
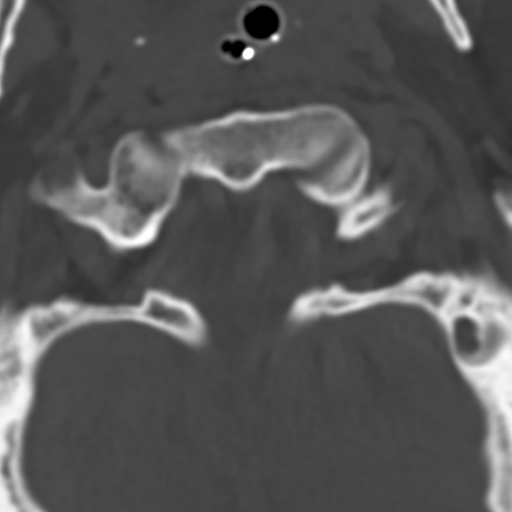
[im 85/94  brain]
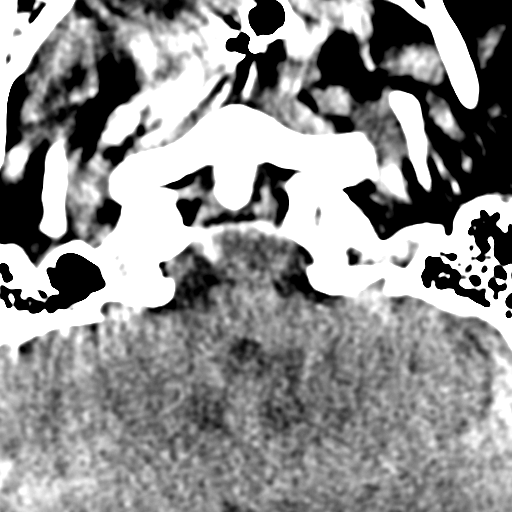

[15 of 47 positions shown; findings below may reference images not displayed]

FINDINGS: CT HEAD FINDINGS

BRAIN: No intraparenchymal hemorrhage, mass effect nor midline
shift. The ventricles and sulci are normal for age. No acute large
vascular territory infarcts. No abnormal extra-axial fluid
collections. Basal cisterns are patent.

VASCULAR: Mild calcific atherosclerosis of the carotid siphons.

SKULL: No skull fracture. No significant scalp soft tissue swelling.

SINUSES/ORBITS: The mastoid air-cells and included paranasal sinuses
are well-aerated.Status post LEFT ocular lens implant. Bilateral
scleral calcifications.

OTHER: Life-support lines in place.

CT CERVICAL SPINE FINDINGS

ALIGNMENT: Maintained lordosis. Vertebral bodies in alignment. Mild
S-type scoliosis.

SKULL BASE AND VERTEBRAE: Cervical vertebral bodies and posterior
elements are intact. C3-4 and C6-7 segmentation anomalies. Severe
C5-6 disc height loss, vacuum disc and endplate spurring.

SOFT TISSUES AND SPINAL CANAL: Nonacute. Moderate calcific
atherosclerosis RIGHT greater than LEFT carotid bulbs.

DISC LEVELS: Mild canal stenosis C4-5 and C5-6. Mild LEFT C4-5,
severe LEFT and mild RIGHT C5-6, mild LEFT C7-T1 neural foraminal
narrowing.

UPPER CHEST: Lung apices are clear.

OTHER: None.
IMPRESSION: CT HEAD: Negative noncontrast CT HEAD for age.

CT CERVICAL SPINE:

1. No acute fracture or malalignment.
2. Multilevel segmentation anomaly.

## 2019-05-18 IMAGING — DX DG CHEST 1V PORT
1 series · 1 of 1 positions shown · non-contrast
Comparison: Chest x-ray of June 27, 2017

CLINICAL DATA: Respiratory failure, intubated patient, status post
7 cardiac arrest.

EXAM:
PORTABLE CHEST 1 VIEW

[chest]
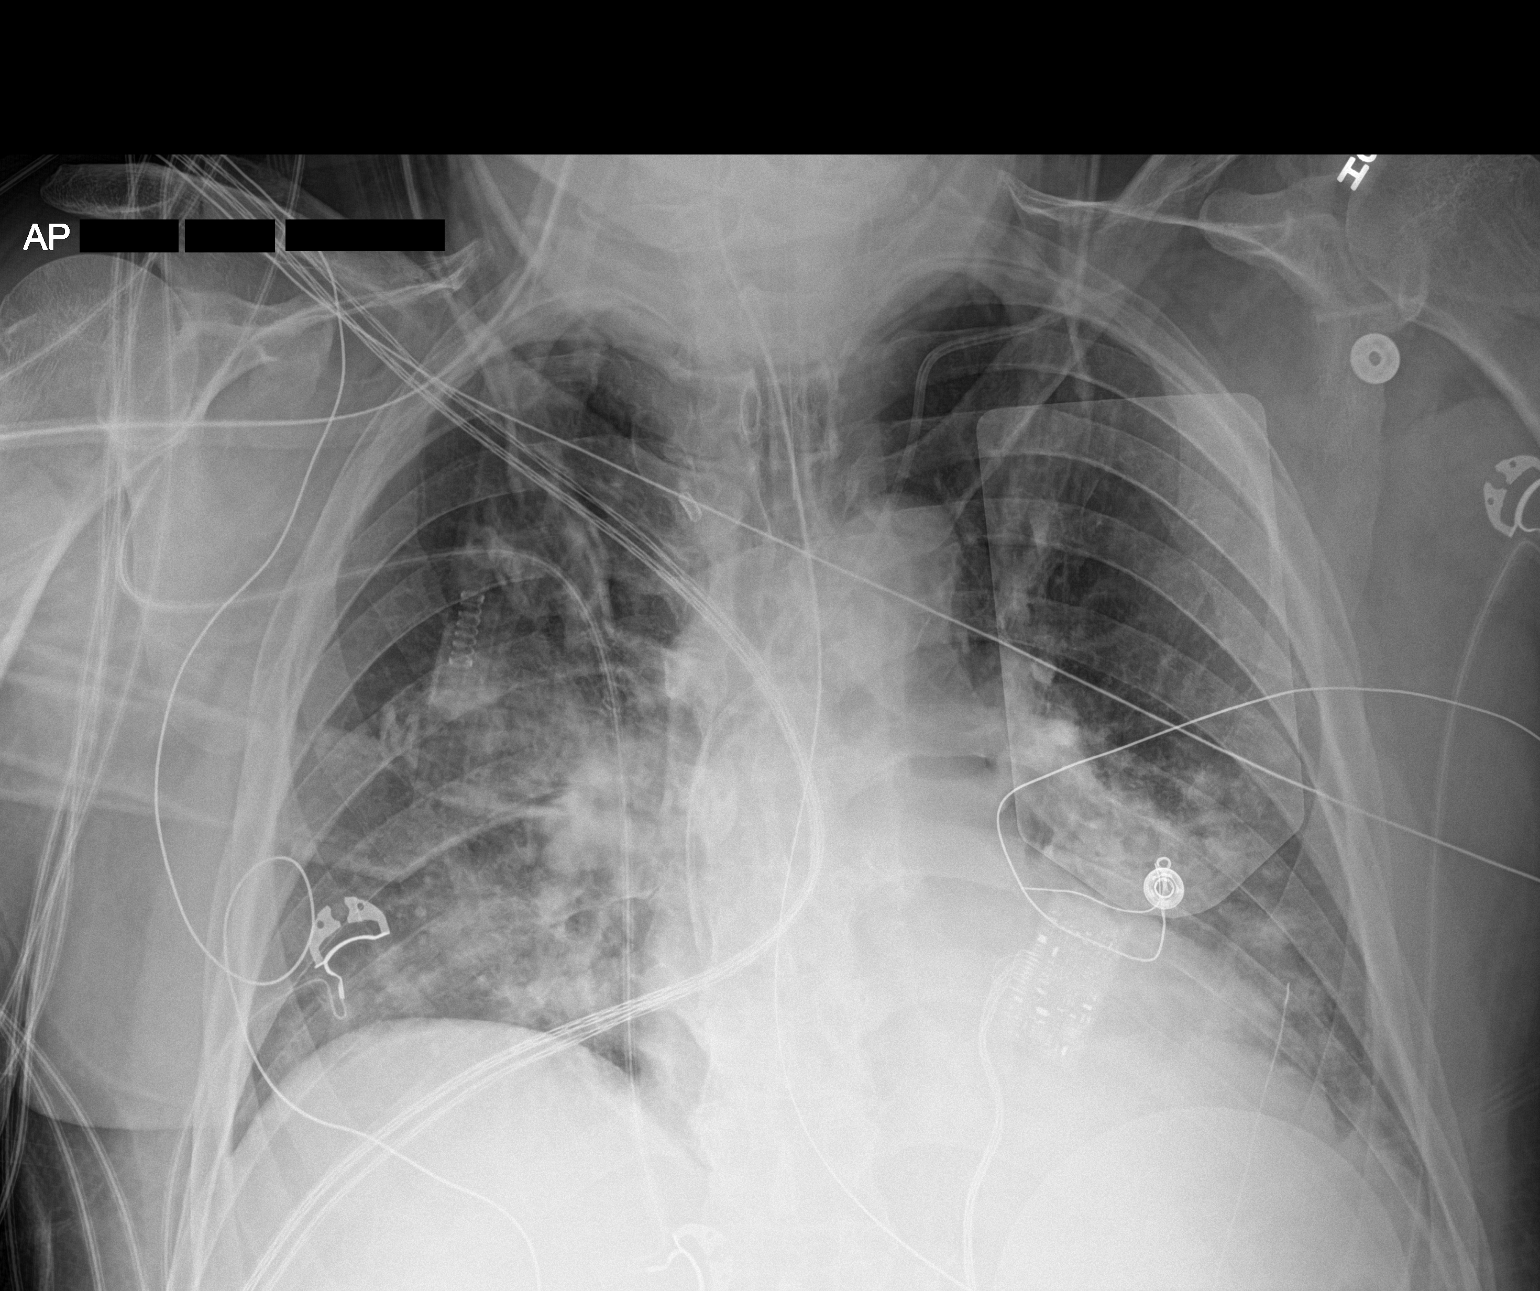

[1 of 1 positions shown; findings below may reference images not displayed]

FINDINGS: The lungs are reasonably well inflated. There are coarse alveolar
infiltrates in the mid and lower lungs bilaterally more conspicuous
today. The retrocardiac region on the left is dense. There is no
pneumothorax or significant pleural effusion. The esophagogastric
tube tip in proximal port project below the inferior margin of the
image. A left internal jugular venous catheter tip projects over the
distal third of the SVC. The endotracheal tube tip lies
approximately 3.9 cm above the carina.
IMPRESSION: Mild interval worsening in the appearance of the mid and lower lungs
bilaterally. This may reflect alveolar edema but aspiration
pneumonia is felt likely.

## 2019-05-27 ENCOUNTER — Encounter: Payer: Self-pay | Admitting: Family Medicine

## 2019-05-27 ENCOUNTER — Ambulatory Visit (INDEPENDENT_AMBULATORY_CARE_PROVIDER_SITE_OTHER): Payer: Medicare Other

## 2019-05-27 ENCOUNTER — Other Ambulatory Visit: Payer: Self-pay

## 2019-05-27 ENCOUNTER — Ambulatory Visit (INDEPENDENT_AMBULATORY_CARE_PROVIDER_SITE_OTHER): Payer: Medicare Other | Admitting: Family Medicine

## 2019-05-27 VITALS — BP 128/88 | HR 55 | Temp 98.0°F | Resp 18 | Ht 72.0 in | Wt 200.0 lb

## 2019-05-27 DIAGNOSIS — M25551 Pain in right hip: Secondary | ICD-10-CM

## 2019-05-27 MED ORDER — MELOXICAM 15 MG PO TABS: 15.0000 mg | ORAL_TABLET | Freq: Every day | ORAL | 0 refills | Status: DC

## 2019-05-27 NOTE — Progress Notes (Signed)
Jeffery Thomas - 68 y.o. male MRN [id number]  Date of birth: 12/17/1950  Subjective Chief Complaint  Patient presents with  . Leg Pain    painfull on ambulation- R lateral hip/thigh pain     HPI Jeffery Thomas is a 68 y.o. male with history of cardiac arrest and OA here today with complaint of R hip pain.  He has had this for a couple of months.  Thought it may be related to hernia but never resolved after hernia surgery.  Pain located in R groin with radiation into lateral thigh.  Pain is worse with walking/weightbearing.  Denies any known injury.  Denies numbness, tingling or weakness of the leg  Denies locking sensation.    ROS:  A comprehensive ROS was completed and negative except as noted per HPI  No Known Allergies  Past Medical History:  Diagnosis Date  . Chronic systolic CHF (congestive heart failure) ([place])   . Gastroesophageal reflux disease   . History of alcohol abuse    quit 08/ 20/ 2018  . History of cardiac arrest   . History of encephalopathy 07/05/2017   anoxic-ischemic   . History of ST elevation myocardial infarction (STEMI) 06/26/2017   sudden cardiac arrest /  per cardiac cath 06-26-2017 normal coronaries, ef <20% , severe LVSD; dx takatsuki syndrome  . History of sudden cardiac arrest 06/26/2017   VF arrest / STEMI   . Hypertension   . 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 Inguinal hernia    left  . Left hydrocele   . Left inguinal hernia   . Left inguinal hernia 05/07/2019  . Myocardial infarction ([place])   . NICM (nonischemic cardiomyopathy) ([place])    06-26-2017  per cardiac cath , ef <20% (echo 35-40%)  ;  last echo 10-09-2014 ef 60-65%  . OSA (obstructive sleep apnea)    03-26-2019 per pt had sleep study after heart attack, was told no recommendation , but did stop smoking and alcohol  . Pre-diabetes    denies  . 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 CATARACT EXTRACTION W/ INTRAOCULAR LENS IMPLANT Left 2017  . COLONOSCOPY    . HYDROCELE EXCISION Left 04/02/2019   Procedure: HYDROCELECTOMY ADULT;  Surgeon: Festus Aloe, MD;  Location: [facility];  Service: Urology;  Laterality: Left;  . ICD GENERATOR CHANGEOUT N/A 07/22/2018   Procedure: [place];  Surgeon: Evans Lance, MD;  Location: [facility];  Service: Cardiovascular;  Laterality: N/A;  . ICD IMPLANT N/A 07/04/2017   SJM Fortify Assura VR ICD implanted by Dr Rayann Heman for secondary prevention after VF arrest  . INGUINAL HERNIA REPAIR Left 05/07/2019   Procedure: OPEN REPAIR LEFT INGUINAL HERNIA WITH MESH;  Surgeon: Fanny Skates, MD;  Location: [facility];  Service: General;  Laterality: Left;  . LEFT HEART CATH AND CORONARY ANGIOGRAPHY N/A 06/26/2017   Procedure: LEFT HEART CATH AND CORONARY ANGIOGRAPHY;  Surgeon: Lorretta Harp, MD;  Location: [facility];  Service: Cardiovascular;  Laterality: N/A;  . MENISCUS REPAIR Left 1983   left knee  . TOTAL KNEE ARTHROPLASTY Left 2009  approx.  Marland Kitchen TOTAL KNEE REVISION Left 04/18/2018   Procedure: LEFT TOTAL KNEE REVISION;  Surgeon: Gaynelle Arabian, MD;  Location:  [facility];  Service: Orthopedics;  Laterality: Left;  Adductor Block    Social History   Socioeconomic History  . Marital status: Married    Spouse name: Not on file  . Number of children: Not on file  . Years of education: Not on file  . Highest education level: Not on file  Occupational History  . Not on file  Social Needs  . Financial resource strain: Not on file  . Food insecurity    Worry: Not on file    Inability: Not on file  . Transportation needs    Medical: Not on file    Non-medical: Not on file  Tobacco Use  . Smoking status: Former Smoker    Types: Cigars    Quit date: 06/26/2017     Years since quitting: 1.9  . Smokeless tobacco: Never Used  Substance and Sexual Activity  . Alcohol use: Not Currently    Comment: quit 2018  . Drug use: Never  . Sexual activity: Not on file  Lifestyle  . Physical activity    Days per week: Not on file    Minutes per session: Not on file  . Stress: Not on file  Relationships  . Social Herbalist on phone: Not on file    Gets together: Not on file    Attends religious service: Not on file    Active member of club or organization: Not on file    Attends meetings of clubs or organizations: Not on file    Relationship status: Not on file  Other Topics Concern  . Not on file  Social History Narrative  . Not on file    Family History  Problem Relation Age of Onset  . Hypertension Mother   . Diabetes Father   . Cancer Brother        pancreatic    Health Maintenance  Topic Date Due  . Hepatitis C Screening  1951/06/26  . COLONOSCOPY  05/22/2003  . INFLUENZA VACCINE  06/08/2019  . PNA vac Low Risk Adult (2 of 2 - PPSV23) 07/18/2019  . TETANUS/TDAP  09/21/2026    ----------------------------------------------------------------------------------------------------------------------------------------------------------------------------------------------------------------- Physical Exam BP 128/88   Pulse (!) 55   Temp 98 F (36.7 C) (Oral)   Resp 18   Ht 6' (1.829 m)   Wt 200 lb (90.7 kg)   SpO2 97%   BMI 27.12 kg/m   Physical Exam Constitutional:      Appearance: Normal appearance.  HENT:     Head: Normocephalic and atraumatic.     Mouth/Throat:     Mouth: Mucous membranes are moist.  Eyes:     General: No scleral icterus. Neck:     Musculoskeletal: Neck supple.  Cardiovascular:     Rate and Rhythm: Normal rate and regular rhythm.  Pulmonary:     Effort: Pulmonary effort is normal.     Breath sounds: Normal breath sounds.  Musculoskeletal:     Comments: Greater trochanter non-tender.  ROM of hip  is painful with flexion, worse with internal rotation.  ER pretty good with mild pain.    Neurological:     General: No focal deficit present.     Mental Status: He is alert.  Psychiatric:        Mood and Affect: Mood normal.        Behavior: Behavior normal.     ------------------------------------------------------------------------------------------------------------------------------------------------------------------------------------------------------------------- Assessment and Plan  Right hip pain -OA vs hip impingment, xrays ordered today.  -Trial of meloxicam  in place of ibuprofen.

## 2019-05-27 NOTE — Assessment & Plan Note (Signed)
-  OA vs hip impingment, xrays ordered today.  -Trial of meloxicam in place of ibuprofen.

## 2019-05-27 NOTE — Patient Instructions (Signed)
Try meloxicam in place of ibuprofen.  We'll be in touch with xray results.

## 2019-06-03 ENCOUNTER — Telehealth: Payer: Self-pay | Admitting: Family Medicine

## 2019-06-03 NOTE — Telephone Encounter (Signed)
Patient came in saying that he was able to see his labs results from his MyChart. Patient came in the office wanted to see if Dr. Zigmund Thomas could put a referral in for ortho. Please advise.

## 2019-06-04 ENCOUNTER — Other Ambulatory Visit: Payer: Self-pay | Admitting: Family Medicine

## 2019-06-04 DIAGNOSIS — M1611 Unilateral primary osteoarthritis, right hip: Secondary | ICD-10-CM

## 2019-06-04 NOTE — Telephone Encounter (Signed)
Does he have a preference on which orthopedic group he sees?

## 2019-06-04 NOTE — Telephone Encounter (Signed)
Completed.

## 2019-06-04 NOTE — Telephone Encounter (Signed)
Sent mychart message to make pt aware.

## 2019-06-04 NOTE — Telephone Encounter (Signed)
Dr. Matthews - please advise.  

## 2019-06-04 NOTE — Telephone Encounter (Signed)
Pt states he seen Dr.Aluisio at [facility] for his knee replacement he wasn't sure if he did hips as well but he would like a referral there.

## 2019-06-11 ENCOUNTER — Telehealth: Payer: Self-pay

## 2019-06-11 NOTE — Telephone Encounter (Signed)
Spoke with patient to remind of missed remote transmission 

## 2019-06-14 NOTE — Progress Notes (Signed)
No ICM remote transmission received for 06/10/2019 and next ICM transmission scheduled for 07/08/2019.

## 2019-06-21 ENCOUNTER — Other Ambulatory Visit: Payer: Self-pay | Admitting: Family Medicine

## 2019-06-21 MED ORDER — MELOXICAM 15 MG PO TABS: 15.0000 mg | ORAL_TABLET | Freq: Every day | ORAL | 0 refills | Status: DC

## 2019-07-08 ENCOUNTER — Ambulatory Visit (INDEPENDENT_AMBULATORY_CARE_PROVIDER_SITE_OTHER): Payer: Medicare Other | Admitting: *Deleted

## 2019-07-08 DIAGNOSIS — I428 Other cardiomyopathies: Secondary | ICD-10-CM | POA: Diagnosis not present

## 2019-07-08 LAB — CUP PACEART REMOTE DEVICE CHECK
Battery Remaining Longevity: 97 mo
Battery Remaining Percentage: 92 %
Battery Voltage: 3.13 V
Brady Statistic RV Percent Paced: 1 %
Date Time Interrogation Session: 20200831081221
HighPow Impedance: 81 Ohm
HighPow Impedance: 81 Ohm
Implantable Lead Implant Date: 20180828
Implantable Lead Location: 753860
Implantable Pulse Generator Implant Date: 20190915
Lead Channel Impedance Value: 400 Ohm
Lead Channel Pacing Threshold Amplitude: 0.5 V
Lead Channel Pacing Threshold Pulse Width: 0.5 ms
Lead Channel Sensing Intrinsic Amplitude: 11.9 mV
Lead Channel Setting Pacing Amplitude: 2.5 V
Lead Channel Setting Pacing Pulse Width: 0.5 ms
Lead Channel Setting Sensing Sensitivity: 0.5 mV
Pulse Gen Serial Number: 9851932

## 2019-07-09 ENCOUNTER — Ambulatory Visit (INDEPENDENT_AMBULATORY_CARE_PROVIDER_SITE_OTHER): Payer: Medicare Other

## 2019-07-09 DIAGNOSIS — Z95810 Presence of automatic (implantable) cardiac defibrillator: Secondary | ICD-10-CM

## 2019-07-09 DIAGNOSIS — I428 Other cardiomyopathies: Secondary | ICD-10-CM

## 2019-07-12 ENCOUNTER — Other Ambulatory Visit: Payer: Self-pay | Admitting: Cardiovascular Disease

## 2019-07-12 NOTE — Telephone Encounter (Signed)
Please advise if OK to refill. Last seen 10/24/2017 by Dr. Gwenlyn Found. Med listed under Historical Provider.

## 2019-07-12 NOTE — Progress Notes (Signed)
EPIC Encounter for ICM Monitoring  Patient Name: Jeffery Thomas is a 68 y.o. male Date: 07/12/2019 Primary Care Physican: Luetta Nutting, DO Primary Cardiologist:Berry Electrophysiologist:Allred LastWeight:201lbs -office weight  Attempted call to patient and unable to reach.  Left detailed message per DPR regarding transmission. Transmission reviewed.    CorvueThoracic impedancesuggesting fluid accumulation since 07/05/2019  Labs: 07/20/2018 Creatinine1.03, BUN17, Potassium3.4, [id number] 07/17/2018 Creatinine1.00, BUN19, Potassium4.4, Sodium140, [id number]  04/20/2018 Creatinine1.00, BUN19, Potassium3.7, Sodium140  06/13/2019Creatinine 1.00, BUN16, Potassium4.6, Sodium138  06/07/2019Creatinine 1.07, BUN17, Potassium4.0, Sodium141 A complete set of results can be found in Results Review.  Recommendations:  Left voice mail with ICM number and encouraged to call if experiencing any fluid symptoms.  Follow-up plan: ICM clinic phone appointment on 07/17/2019 (manual send) to recheck fluid levels.     Copy of ICM check sent to Dr. Rayann Heman and Dr Gwenlyn Found.   3 month ICM trend: 07/08/2019    1 Year ICM trend:       Rosalene Billings, RN 07/12/2019 1:23 PM

## 2019-07-17 ENCOUNTER — Encounter: Payer: Self-pay | Admitting: Cardiology

## 2019-07-17 NOTE — Progress Notes (Signed)
Remote ICD transmission.   

## 2019-07-19 NOTE — Progress Notes (Signed)
No ICM remote transmission received for 07/17/2019 and next ICM transmission scheduled for 09/02/2019.

## 2019-07-20 ENCOUNTER — Other Ambulatory Visit: Payer: Self-pay | Admitting: Cardiovascular Disease

## 2019-07-26 ENCOUNTER — Telehealth: Payer: Self-pay | Admitting: Cardiovascular Disease

## 2019-07-26 NOTE — Telephone Encounter (Signed)
   [facility] Pre-operative Risk Assessment    Request for surgical clearance:  1. What type of surgery is being performed? Right hip arthoplasty    2. When is this surgery scheduled? 09/18/2019   3. What type of clearance is required (medical clearance vs. Pharmacy clearance to hold med vs. Both)? Medical   4. Are there any medications that need to be held prior to surgery and how long? None specified    5. Practice name and name of physician performing surgery? Dr. Hector Shade @ [facility]   6. What is your office phone number [phone]    7.   What is your office fax number [phone] Attn: [name]  8.   Anesthesia type (None, local, MAC, general) ? Choice   Jeffery Thomas 07/26/2019, 3:32 PM  _________________________________________________________________   (provider comments below)

## 2019-07-26 NOTE — Telephone Encounter (Signed)
I s/w pt who states he has appt already with Dr. Gwenlyn Found 09/03/19. I apologized to the pt for the call, pt no problem. I will route to Dr. Gwenlyn Found for appt.

## 2019-07-26 NOTE — Telephone Encounter (Signed)
   Primary Cardiologist:Jonathan Gwenlyn Found, MD  Chart reviewed as part of pre-operative protocol coverage. Because of Jeffery Thomas past medical history and time since last visit, he/she will require a follow-up visit in order to better assess preoperative cardiovascular risk.  Pre-op covering staff: - Please schedule appointment and call patient to inform them. - Please contact requesting surgeon's office via preferred method (i.e, phone, fax) to inform them of need for appointment prior to surgery.  If applicable, this message will also be routed to pharmacy pool and/or primary cardiologist for input on holding anticoagulant/antiplatelet agent as requested below so that this information is available at time of patient's appointment.   Kathyrn Drown, NP  07/26/2019, 3:52 PM

## 2019-07-29 ENCOUNTER — Telehealth: Payer: Self-pay

## 2019-07-29 MED ORDER — MELOXICAM 15 MG PO TABS: 15.0000 mg | ORAL_TABLET | Freq: Every day | ORAL | 0 refills | Status: DC

## 2019-07-29 NOTE — Telephone Encounter (Signed)
Ok to send

## 2019-07-29 NOTE — Telephone Encounter (Signed)
Pt requesting [facility] Last refill sent 8/14 #30 as a trial. Pt requesting 90 day refill. Okay to send in?

## 2019-07-29 NOTE — Addendum Note (Signed)
Addended by: Janan Ridge on: 07/29/2019 09:27 AM   Modules accepted: Orders

## 2019-07-29 NOTE — Telephone Encounter (Signed)
Pt informed through mychart

## 2019-08-27 ENCOUNTER — Ambulatory Visit (INDEPENDENT_AMBULATORY_CARE_PROVIDER_SITE_OTHER): Payer: Medicare Other

## 2019-08-27 DIAGNOSIS — Z23 Encounter for immunization: Secondary | ICD-10-CM

## 2019-08-30 ENCOUNTER — Encounter: Payer: Self-pay | Admitting: Family Medicine

## 2019-08-30 NOTE — Telephone Encounter (Signed)
I spoke with Jeffery Thomas and gave him the number to behavioral medicine. He did not have any questions or concerns prior to call ending.

## 2019-09-02 ENCOUNTER — Ambulatory Visit (INDEPENDENT_AMBULATORY_CARE_PROVIDER_SITE_OTHER): Payer: Medicare Other

## 2019-09-02 ENCOUNTER — Telehealth: Payer: Self-pay

## 2019-09-02 DIAGNOSIS — I428 Other cardiomyopathies: Secondary | ICD-10-CM

## 2019-09-02 DIAGNOSIS — Z95810 Presence of automatic (implantable) cardiac defibrillator: Secondary | ICD-10-CM | POA: Diagnosis not present

## 2019-09-02 NOTE — Telephone Encounter (Signed)
Spoke with patient and requested to send manual remote transmission for review.  He will send it this evening.  He is doing well and scheduled to have total hip surgery 09/18/2019.

## 2019-09-03 ENCOUNTER — Other Ambulatory Visit: Payer: Self-pay

## 2019-09-03 ENCOUNTER — Encounter: Payer: Self-pay | Admitting: Cardiovascular Disease

## 2019-09-03 ENCOUNTER — Ambulatory Visit (INDEPENDENT_AMBULATORY_CARE_PROVIDER_SITE_OTHER): Payer: Medicare Other | Admitting: Cardiovascular Disease

## 2019-09-03 VITALS — BP 176/98 | HR 54 | Ht 72.0 in | Wt 206.4 lb

## 2019-09-03 DIAGNOSIS — E782 Mixed hyperlipidemia: Secondary | ICD-10-CM | POA: Diagnosis not present

## 2019-09-03 DIAGNOSIS — Z8679 Personal history of other diseases of the circulatory system: Secondary | ICD-10-CM

## 2019-09-03 DIAGNOSIS — Z008 Encounter for other general examination: Secondary | ICD-10-CM | POA: Diagnosis not present

## 2019-09-03 DIAGNOSIS — I10 Essential (primary) hypertension: Secondary | ICD-10-CM

## 2019-09-03 DIAGNOSIS — I4901 Ventricular fibrillation: Secondary | ICD-10-CM | POA: Diagnosis not present

## 2019-09-03 DIAGNOSIS — Z01818 Encounter for other preprocedural examination: Secondary | ICD-10-CM | POA: Insufficient documentation

## 2019-09-03 DIAGNOSIS — Z4502 Encounter for adjustment and management of automatic implantable cardiac defibrillator: Secondary | ICD-10-CM

## 2019-09-03 NOTE — Progress Notes (Signed)
09/03/2019 Jeffery Thomas   18-Feb-1951  [id number]  Primary Physician Luetta Nutting, DO Primary Cardiologist: Lorretta Harp MD FACP, [place], [place], [place]  HPI:  Jeffery Thomas is a 69 y.o. married, father of one with no grandchildren who has not worked the last 7 years and is accompanied by his wife Jeffery Thomas who works for the [employer] raising. I last saw him in the office 10/24/2017. He had witnessed cardiac death on the evening of 06-30-17. He had fairly immediate CPR after the EMS was called and was shocked 3 times. He had CPR and had R OSC in 20 minutes. He is transported to [facility] where I performed urgent cardiac catheterization revealing normal coronary arteries and severe LV dysfunction consistent withTakatsubosyndrome. 8 days later he had an ICD implanted by Dr. Rayann Heman for secondary prevention. He was discharged home from the hospital 3 weeks ago. Prior to his presentation he did have a history of treated hypertension and hyperlipidemia. His mother died of a myocardial infarction at age 42. He does smoke cigarettes or playing golf and prior to this drank several hard drinks of liquor a night. Since that time his drinking and has been markedly limited. He has no recollection of the event or one week prior to the event. He has no neurologic deficits otherwise.   He has had an ICD implanted by Dr. Rayann Heman September 2018 for secondary prevention. 2-D echo performed 10/09/17 showed complete normalization of LV function.  He had his ICD generator explanted because of early battery depletion by Dr. Lovena Le 07/20/2018 with a new device installed.  Is followed by Dr. Rayann Heman as an outpatient.  He is not had an ICD discharge.  He denies chest pain or shortness of breath.  He does occasionally smoke a cigar when playing golf.  He is apparently scheduled for elective total hip replacement by Dr. Wynelle Link 09/18/2019.     Current Meds  Medication Sig  . atorvastatin (LIPITOR) 20  MG tablet TAKE 1 TABLET DAILY  . carvedilol (COREG) 25 MG tablet Take 0.5 tablets (12.5 mg total) by mouth 2 (two) times daily.  Marland Kitchen ibuprofen (ADVIL,MOTRIN) 200 MG tablet Take 400 mg by mouth every 6 (six) hours as needed for headache or mild pain (pain).   Marland Kitchen levocetirizine (XYZAL) 5 MG tablet   . losartan (COZAAR) 100 MG tablet TAKE 1 TABLET DAILY (Patient taking differently: Take 100 mg by mouth daily. )  . meloxicam (MOBIC) 15 MG tablet Take 1 tablet (15 mg total) by mouth daily.  . montelukast (SINGULAIR) 10 MG tablet Take 1 tablet (10 mg total) by mouth daily.  Marland Kitchen omeprazole (PRILOSEC) 40 MG capsule Take 1 capsule (40 mg total) by mouth daily.  Marland Kitchen spironolactone (ALDACTONE) 25 MG tablet TAKE 1 TABLET DAILY     No Known Allergies  Social History   Socioeconomic History  . Marital status: Married    Spouse name: Not on file  . Number of children: Not on file  . Years of education: Not on file  . Highest education level: Not on file  Occupational History  . Not on file  Social Needs  . Financial resource strain: Not on file  . Food insecurity    Worry: Not on file    Inability: Not on file  . Transportation needs    Medical: Not on file    Non-medical: Not on file  Tobacco Use  . Smoking status: Former Smoker    Types:  Cigars    Quit date: 06/26/2017    Years since quitting: 2.1  . Smokeless tobacco: Never Used  Substance and Sexual Activity  . Alcohol use: Not Currently    Comment: quit 2018  . Drug use: Never  . Sexual activity: Not on file  Lifestyle  . Physical activity    Days per week: Not on file    Minutes per session: Not on file  . Stress: Not on file  Relationships  . Social Herbalist on phone: Not on file    Gets together: Not on file    Attends religious service: Not on file    Active member of club or organization: Not on file    Attends meetings of clubs or organizations: Not on file    Relationship status: Not on file  . Intimate partner  violence    Fear of current or ex partner: Not on file    Emotionally abused: Not on file    Physically abused: Not on file    Forced sexual activity: Not on file  Other Topics Concern  . Not on file  Social History Narrative  . Not on file     Review of Systems: General: negative for chills, fever, night sweats or weight changes.  Cardiovascular: negative for chest pain, dyspnea on exertion, edema, orthopnea, palpitations, paroxysmal nocturnal dyspnea or shortness of breath Dermatological: negative for rash Respiratory: negative for cough or wheezing Urologic: negative for hematuria Abdominal: negative for nausea, vomiting, diarrhea, bright red blood per rectum, melena, or hematemesis Neurologic: negative for visual changes, syncope, or dizziness All other systems reviewed and are otherwise negative except as noted above.    Blood pressure (!) 174/91, pulse (!) 50, height 6' (1.829 m), weight 206 lb 6.4 oz (93.6 kg), SpO2 94 %.  General appearance: alert and no distress Neck: no adenopathy, no carotid bruit, no JVD, supple, symmetrical, trachea midline and thyroid not enlarged, symmetric, no tenderness/mass/nodules Lungs: clear to auscultation bilaterally Heart: regular rate and rhythm, S1, S2 normal, no murmur, click, rub or gallop Extremities: extremities normal, atraumatic, no cyanosis or edema Pulses: 2+ and symmetric Skin: Skin color, texture, turgor normal. No rashes or lesions Neurologic: Alert and oriented X 3, normal strength and tone. Normal symmetric reflexes. Normal coordination and gait  EKG sinus bradycardia 50 without ST or T wave changes.  Personally reviewed this EKG.  ASSESSMENT AND PLAN:   Ventricular fibrillation ([place]) History of VF arrest 06/26/2017 with defibrillation and ROSC of 20 minutes.  Cardiac cath performed by myself revealed normal coronary arteries with severe LV dysfunction.  His EF normalized on guideline directed medical therapy by 2D echo  10/09/2017.  He denies chest pain or shortness of breath.  Benign essential HTN History of essential hypertension with blood pressure measured today at 174/91.  Usually his blood pressure is much lower than this.  He is on carvedilol, losartan and spironolactone.  Hyperlipidemia History of hyperlipidemia on statin therapy with lipid profile performed 07/17/2018 revealing total cholesterol 118, LDL 65 and HDL 37.  ICD (implantable cardioverter-defibrillator) battery depletion History of ICD implanted by Dr. Rayann Heman September 2018 after his episode of sudden cardiac death.  He had his generator replaced by Dr. Lovena Le 07/20/2018 for because of early battery depletion.  He is not had a discharge.  He is followed by Dr. Rayann Heman.  Preoperative clearance Mr. Kope is scheduled to have a total hip replacement by Dr. Wynelle Link 09/18/2019 and he is here for preoperative  clearance.  He does have known normal coronary arteries.  His last 2D echo performed 10/09/2017 showed normal EF.  He does have an ICD in place which is not discharged for any reason.  Get a repeat a 2D echocardiogram and if this is normal I will clear him at low risk for his upcoming orthopedic surgical procedure.      Lorretta Harp MD FACP,FACC,FAHA, [facility] 09/03/2019 11:48 AM

## 2019-09-03 NOTE — Progress Notes (Signed)
Attempted patient call and no answer.

## 2019-09-03 NOTE — Assessment & Plan Note (Signed)
History of essential hypertension with blood pressure measured today at 174/91.  Usually his blood pressure is much lower than this.  He is on carvedilol, losartan and spironolactone.

## 2019-09-03 NOTE — Assessment & Plan Note (Signed)
History of hyperlipidemia on statin therapy with lipid profile performed 07/17/2018 revealing total cholesterol 118, LDL 65 and HDL 37.

## 2019-09-03 NOTE — Telephone Encounter (Signed)
Copied from [place] [id number]. Topic: General - Other >> Sep 02, 2019  1:47 PM Yvette Rack wrote: Reason for CRM: Pt stated that he needs the surgical clearance form to be completed as his surgery is scheduled for 09/18/19. Pt requests call back

## 2019-09-03 NOTE — Progress Notes (Signed)
EPIC Encounter for ICM Monitoring  Patient Name: Jeffery Thomas is a 68 y.o. male Date: 09/03/2019 Primary Care Physican: Luetta Nutting, DO Primary Cardiologist:Berry Electrophysiologist:Allred 10/27/2020Weight:206lbs -office weight  Transmission reviewed.  Spoke with patient on 09/02/2019 and he reports he is feeling fine.  Denies fluid symptoms.  Total hip replacement surgery scheduled in November.  CorvueThoracic impedancesuggesting possible fluid accumulation from 08/21/2019 until 09/02/2019 transmission. Returned to normal on 10/26.   Labs: 05/02/2019 Creatinine 1.02, BUN 16, Potassium 4.6, Sodium 136, GFR >60 A complete set of results can be found in Results Review.  Recommendations: None.  Follow-up plan: ICM clinic phone appointment on 10/08/2019.   91 day device clinic remote transmission 10/07/2019.     Copy of ICM check sent to Dr. Rayann Heman and Dr Gwenlyn Found (patient had visit with Dr Gwenlyn Found today, 09/03/2019).   3 month ICM trend: 09/03/2019    1 Year ICM trend:       Rosalene Billings, RN 09/03/2019 12:56 PM

## 2019-09-03 NOTE — Assessment & Plan Note (Signed)
History of VF arrest 06/26/2017 with defibrillation and ROSC of 20 minutes.  Cardiac cath performed by myself revealed normal coronary arteries with severe LV dysfunction.  His EF normalized on guideline directed medical therapy by 2D echo 10/09/2017.  He denies chest pain or shortness of breath.

## 2019-09-03 NOTE — Patient Instructions (Addendum)
Medication Instructions:  Your physician recommends that you continue on your current medications as directed. Please refer to the Current Medication list given to you today.  If you need a refill on your cardiac medications before your next appointment, please call your pharmacy.   Lab work: none If you have labs (blood work) drawn today and your tests are completely normal, you will receive your results only by:  Marland Kitchen MyChart Message (if you have MyChart) OR . A paper copy in the mail If you have any lab test that is abnormal or we need to change your treatment, we will call you to review the results.  Testing/Procedures: Your physician has requested that you have an echocardiogram. Echocardiography is a painless test that uses sound waves to create images of your heart. It provides your doctor with information about the size and shape of your heart and how well your heart's chambers and valves are working. This procedure takes approximately one hour. There are no restrictions for this procedure. LOCATION: [place] [facility]: [place], [place], [place] [address]   Follow-Up: At [facility], you and your health needs are our priority.  As part of our continuing mission to provide you with exceptional heart care, we have created designated Provider Care Teams.  These Care Teams include your primary Cardiologist (physician) and Advanced Practice Providers (APPs -  Physician Assistants and Nurse Practitioners) who all work together to provide you with the care you need, when you need it. You will need a follow up appointment in January 2021 with Chanetta Marshall NP and in 12 months with Dr. Quay Burow.  Please call our office 2 months in advance to schedule each appointment.    Any Other Special Instructions Will Be Listed Below (If Applicable). You will be cleared at low risk, from a cardiac standpoint, for your upcoming procedure after Dr. Gwenlyn Found has  reviewed the results of your echocardiogram.  KEEP A BLOOD PRESSURE LOG FOR 5 DAYS THEN FOLLOW UP WITH A CLINICAL PHARMACIST IN THE HYPERTENSION CLINIC. YOU WILL NEED AN IN-PERSON APPOINTMENT IN THE OFFICE. PLEASE CALL TO SCHEDULE THIS IN-PERSON APPOINTMENT IF YOU DID NOT ALREADY DO SO DURING YOUR LAST VISIT AT [facility].

## 2019-09-03 NOTE — Telephone Encounter (Signed)
I do not have any forms for him for surgical clearance.  Were these sent over by his orthopedic office?

## 2019-09-03 NOTE — Assessment & Plan Note (Signed)
History of ICD implanted by Dr. Rayann Heman September 2018 after his episode of sudden cardiac death.  He had his generator replaced by Dr. Lovena Le 07/20/2018 for because of early battery depletion.  He is not had a discharge.  He is followed by Dr. Rayann Heman.

## 2019-09-03 NOTE — Assessment & Plan Note (Signed)
Jeffery Thomas is scheduled to have a total hip replacement by Dr. Wynelle Link 09/18/2019 and he is here for preoperative clearance.  He does have known normal coronary arteries.  His last 2D echo performed 10/09/2017 showed normal EF.  He does have an ICD in place which is not discharged for any reason.  Get a repeat a 2D echocardiogram and if this is normal I will clear him at low risk for his upcoming orthopedic surgical procedure.

## 2019-09-04 NOTE — Telephone Encounter (Signed)
Patient came into office and dropped off surgery forms. Patient would like to pick up forms when forms are completed. Please call patient at [phone] when forms are completed to pick up. Patient forms in Dr. Zigmund Daniel file in the front office.

## 2019-09-10 ENCOUNTER — Other Ambulatory Visit: Payer: Self-pay

## 2019-09-10 ENCOUNTER — Encounter: Payer: Self-pay | Admitting: *Deleted

## 2019-09-10 ENCOUNTER — Ambulatory Visit (HOSPITAL_COMMUNITY): Payer: Medicare Other | Attending: Cardiology

## 2019-09-10 DIAGNOSIS — Z01818 Encounter for other preprocedural examination: Secondary | ICD-10-CM

## 2019-09-10 DIAGNOSIS — Z8679 Personal history of other diseases of the circulatory system: Secondary | ICD-10-CM | POA: Diagnosis present

## 2019-09-10 DIAGNOSIS — I10 Essential (primary) hypertension: Secondary | ICD-10-CM | POA: Insufficient documentation

## 2019-09-10 DIAGNOSIS — Z4502 Encounter for adjustment and management of automatic implantable cardiac defibrillator: Secondary | ICD-10-CM | POA: Diagnosis present

## 2019-09-10 NOTE — Patient Instructions (Addendum)
DUE TO COVID-19 ONLY ONE VISITOR IS ALLOWED TO COME WITH YOU AND STAY IN THE WAITING ROOM ONLY DURING PRE OP AND PROCEDURE DAY OF SURGERY. THE 1 VISITOR MAY VISIT WITH YOU AFTER SURGERY IN YOUR PRIVATE ROOM DURING VISITING HOURS ONLY!  YOU NEED TO HAVE A COVID 19 TEST ON 09-14-19  @ 12:20 PM, THIS TEST MUST BE DONE BEFORE SURGERY, COME  [place], [place] , [address].  ([place]) ONCE YOUR COVID TEST IS COMPLETED, PLEASE BEGIN THE QUARANTINE INSTRUCTIONS AS OUTLINED IN YOUR HANDOUT.                Jeffery Thomas  09/10/2019   Your procedure is scheduled on: 09-18-19    Report to [facility] Main  Entrance    Report to Admitting at 12:50 PM     Call this number if you have problems the morning of surgery [phone]    Remember: NO SOLID FOOD AFTER MIDNIGHT THE NIGHT PRIOR TO SURGERY. NOTHING BY MOUTH EXCEPT CLEAR LIQUIDS UNTIL 12:20PM . PLEASE FINISH ENSURE DRINK PER SURGEON ORDER  WHICH NEEDS TO BE COMPLETED AT  12:20 PM.   CLEAR LIQUID DIET   Foods Allowed                                                                     Foods Excluded  Coffee and tea, regular and decaf                             liquids that you cannot  Plain Jell-O any favor except red or purple                                           see through such as: Fruit ices (not with fruit pulp)                                     milk, soups, orange juice  Iced Popsicles                                    All solid food Carbonated beverages, regular and diet                                    Cranberry, grape and apple juices Sports drinks like Gatorade Lightly seasoned clear broth or consume(fat free) Sugar, honey syrup  Sample Menu Breakfast                                Lunch                                     Supper Cranberry juice  Beef broth                            Chicken broth Jell-O                                     Grape juice                            Apple juice Coffee or tea                        Jell-O                                      Popsicle                                                Coffee or tea                        Coffee or tea  _____________________________________________________________________        Take these medicines the morning of surgery with A SIP OF WATER: Carvedilol (Coreg), Montelukast (Singular), and Omepromazole (Prilosec), Atorvanstin (Lipitor)  BRUSH YOUR TEETH MORNING OF SURGERY AND RINSE YOUR MOUTH OUT, NO CHEWING GUM CANDY OR MINTS.                                 You may not have any metal on your body including hair pins and              piercings     Donot wear jewelry, cologne lotions, powders or deodorant                        Men may shave face and neck.   Do not bring valuables to the hospital. [place].  Contacts, dentures or bridgework may not be worn into surgery.  Leave suitcase in the car. After surgery it may be brought to your room.       Special Instructions: N/A              Please read over the following fact sheets you were given: _____________________________________________________________________             [facility] - Preparing for Surgery Before surgery, you can play an important role.  Because skin is not sterile, your skin needs to be as free of germs as possible.  You can reduce the number of germs on your skin by washing with CHG (chlorahexidine gluconate) soap before surgery.  CHG is an antiseptic cleaner which kills germs and bonds with the skin to continue killing germs even after washing. Please DO NOT use if you have an allergy to CHG or antibacterial soaps.  If your skin becomes reddened/irritated stop using the CHG and inform your nurse when you arrive at Short Stay. Do not shave (including legs  and underarms) for at least 48 hours prior to the first CHG shower.  You may shave your  face/neck. Please follow these instructions carefully:  1.  Shower with CHG Soap the night before surgery and the  morning of Surgery.  2.  If you choose to wash your hair, wash your hair first as usual with your  normal  shampoo.  3.  After you shampoo, rinse your hair and body thoroughly to remove the  shampoo.                           4.  Use CHG as you would any other liquid soap.  You can apply chg directly  to the skin and wash                       Gently with a scrungie or clean washcloth.  5.  Apply the CHG Soap to your body ONLY FROM THE NECK DOWN.   Do not use on face/ open                           Wound or open sores. Avoid contact with eyes, ears mouth and genitals (private parts).                       Wash face,  Genitals (private parts) with your normal soap.             6.  Wash thoroughly, paying special attention to the area where your surgery  will be performed.  7.  Thoroughly rinse your body with warm water from the neck down.  8.  DO NOT shower/wash with your normal soap after using and rinsing off  the CHG Soap.                9.  Pat yourself dry with a clean towel.            10.  Wear clean pajamas.            11.  Place clean sheets on your bed the night of your first shower and do not  sleep with pets. Day of Surgery : Do not apply any lotions/deodorants the morning of surgery.  Please wear clean clothes to the hospital/surgery center.  FAILURE TO FOLLOW THESE INSTRUCTIONS MAY RESULT IN THE CANCELLATION OF YOUR SURGERY PATIENT SIGNATURE_________________________________  NURSE SIGNATURE__________________________________  ________________________________________________________________________   [name]  An incentive spirometer is a tool that can help keep your lungs clear and active. This tool measures how well you are filling your lungs with each breath. Taking long deep breaths may help reverse or decrease the chance of developing breathing  (pulmonary) problems (especially infection) following:  A long period of time when you are unable to move or be active. BEFORE THE PROCEDURE   If the spirometer includes an indicator to show your best effort, your nurse or respiratory therapist will set it to a desired goal.  If possible, sit up straight or lean slightly forward. Try not to slouch.  Hold the incentive spirometer in an upright position. INSTRUCTIONS FOR USE  1. Sit on the edge of your bed if possible, or sit up as far as you can in bed or on a chair. 2. Hold the incentive spirometer in an upright position. 3. Breathe out normally. 4. Place the mouthpiece in your mouth and seal  your lips tightly around it. 5. Breathe in slowly and as deeply as possible, raising the piston or the ball toward the top of the column. 6. Hold your breath for 3-5 seconds or for as long as possible. Allow the piston or ball to fall to the bottom of the column. 7. Remove the mouthpiece from your mouth and breathe out normally. 8. Rest for a few seconds and repeat Steps 1 through 7 at least 10 times every 1-2 hours when you are awake. Take your time and take a few normal breaths between deep breaths. 9. The spirometer may include an indicator to show your best effort. Use the indicator as a goal to work toward during each repetition. 10. After each set of 10 deep breaths, practice coughing to be sure your lungs are clear. If you have an incision (the cut made at the time of surgery), support your incision when coughing by placing a pillow or rolled up towels firmly against it. Once you are able to get out of bed, walk around indoors and cough well. You may stop using the incentive spirometer when instructed by your caregiver.  RISKS AND COMPLICATIONS  Take your time so you do not get dizzy or light-headed.  If you are in pain, you may need to take or ask for pain medication before doing incentive spirometry. It is harder to take a deep breath if you  are having pain. AFTER USE  Rest and breathe slowly and easily.  It can be helpful to keep track of a log of your progress. Your caregiver can provide you with a simple table to help with this. If you are using the spirometer at home, follow these instructions: [place] IF:   You are having difficultly using the spirometer.  You have trouble using the spirometer as often as instructed.  Your pain medication is not giving enough relief while using the spirometer.  You develop fever of 100.5 F (38.1 C) or higher. SEEK IMMEDIATE MEDICAL CARE IF:   You cough up bloody sputum that had not been present before.  You develop fever of 102 F (38.9 C) or greater.  You develop worsening pain at or near the incision site. MAKE SURE YOU:   Understand these instructions.  Will watch your condition.  Will get help right away if you are not doing well or get worse. Document Released: 03/06/2007 Document Revised: 01/16/2012 Document Reviewed: 05/07/2007 ExitCare Patient Information 2014 ExitCare, [place].   ________________________________________________________________________  WHAT IS A BLOOD TRANSFUSION? Blood Transfusion Information  A transfusion is the replacement of blood or some of its parts. Blood is made up of multiple cells which provide different functions.  Red blood cells carry oxygen and are used for blood loss replacement.  White blood cells fight against infection.  Platelets control bleeding.  Plasma helps clot blood.  Other blood products are available for specialized needs, such as hemophilia or other clotting disorders. BEFORE THE TRANSFUSION  Who gives blood for transfusions?   Healthy volunteers who are fully evaluated to make sure their blood is safe. This is blood bank blood. Transfusion therapy is the safest it has ever been in the practice of medicine. Before blood is taken from a donor, a complete history is taken to make sure that person has  no history of diseases nor engages in risky social behavior (examples are intravenous drug use or sexual activity with multiple partners). The donor's travel history is screened to minimize risk of transmitting infections, such  as malaria. The donated blood is tested for signs of infectious diseases, such as HIV and hepatitis. The blood is then tested to be sure it is compatible with you in order to minimize the chance of a transfusion reaction. If you or a relative donates blood, this is often done in anticipation of surgery and is not appropriate for emergency situations. It takes many days to process the donated blood. RISKS AND COMPLICATIONS Although transfusion therapy is very safe and saves many lives, the main dangers of transfusion include:   Getting an infectious disease.  Developing a transfusion reaction. This is an allergic reaction to something in the blood you were given. Every precaution is taken to prevent this. The decision to have a blood transfusion has been considered carefully by your caregiver before blood is given. Blood is not given unless the benefits outweigh the risks. AFTER THE TRANSFUSION  Right after receiving a blood transfusion, you will usually feel much better and more energetic. This is especially true if your red blood cells have gotten low (anemic). The transfusion raises the level of the red blood cells which carry oxygen, and this usually causes an energy increase.  The nurse administering the transfusion will monitor you carefully for complications. HOME CARE INSTRUCTIONS  No special instructions are needed after a transfusion. You may find your energy is better. Speak with your caregiver about any limitations on activity for underlying diseases you may have. SEEK MEDICAL CARE IF:   Your condition is not improving after your transfusion.  You develop redness or irritation at the intravenous (IV) site. SEEK IMMEDIATE MEDICAL CARE IF:  Any of the following  symptoms occur over the next 12 hours:  Shaking chills.  You have a temperature by mouth above 102 F (38.9 C), not controlled by medicine.  Chest, back, or muscle pain.  People around you feel you are not acting correctly or are confused.  Shortness of breath or difficulty breathing.  Dizziness and fainting.  You get a rash or develop hives.  You have a decrease in urine output.  Your urine turns a dark color or changes to pink, red, or brown. Any of the following symptoms occur over the next 10 days:  You have a temperature by mouth above 102 F (38.9 C), not controlled by medicine.  Shortness of breath.  Weakness after normal activity.  The white part of the eye turns yellow (jaundice).  You have a decrease in the amount of urine or are urinating less often.  Your urine turns a dark color or changes to pink, red, or brown. Document Released: 10/21/2000 Document Revised: 01/16/2012 Document Reviewed: 06/09/2008 [facility] Patient Information 2014 [place], [place].  _______________________________________________________________________

## 2019-09-10 NOTE — Telephone Encounter (Signed)
   [facility] Pre-operative Risk Assessment    Request for surgical clearance:  1. What type of surgery is being performed? RIGHT TOTAL HIP ARTHROPLASTY    2. When is this surgery scheduled? 09/18/2019   3. What type of clearance is required (medical clearance vs. Pharmacy clearance to hold med vs. Both)? MEDICAL   4. Are there any medications that need to be held prior to surgery and how long?    5. Practice name and name of physician performing surgery? [facility]    6. What is your office phone number? [phone]    7.   What is your office fax number? [place]   Anesthesia type (None, local, MAC, general) ? CHOICE   PATIENT HAD ECHO 09/10/2019

## 2019-09-12 ENCOUNTER — Encounter (HOSPITAL_COMMUNITY): Payer: Self-pay

## 2019-09-12 ENCOUNTER — Other Ambulatory Visit: Payer: Self-pay

## 2019-09-12 ENCOUNTER — Encounter (HOSPITAL_COMMUNITY)
Admission: RE | Admit: 2019-09-12 | Discharge: 2019-09-12 | Disposition: A | Discharge status: Home / Self Care | Payer: Medicare Other | Source: Ambulatory Visit | Attending: Orthopedic Surgery | Admitting: Orthopedic Surgery

## 2019-09-12 DIAGNOSIS — Z01812 Encounter for preprocedural laboratory examination: Secondary | ICD-10-CM | POA: Insufficient documentation

## 2019-09-12 DIAGNOSIS — M1611 Unilateral primary osteoarthritis, right hip: Secondary | ICD-10-CM | POA: Diagnosis not present

## 2019-09-12 LAB — PROTIME-INR
INR: 1 (ref 0.8–1.2)
Prothrombin Time: 12.6 seconds (ref 11.4–15.2)

## 2019-09-12 LAB — CBC
HCT: 50.2 % (ref 39.0–52.0)
Hemoglobin: 15.8 g/dL (ref 13.0–17.0)
MCH: 28.1 pg (ref 26.0–34.0)
MCHC: 31.5 g/dL (ref 30.0–36.0)
MCV: 89.2 fL (ref 80.0–100.0)
Platelets: 247 10*3/uL (ref 150–400)
RBC: 5.63 MIL/uL (ref 4.22–5.81)
RDW: 13.2 % (ref 11.5–15.5)
WBC: 6.2 10*3/uL (ref 4.0–10.5)
nRBC: 0 % (ref 0.0–0.2)

## 2019-09-12 LAB — COMPREHENSIVE METABOLIC PANEL
ALT: 28 U/L (ref 0–44)
AST: 19 U/L (ref 15–41)
Albumin: 4.6 g/dL (ref 3.5–5.0)
Alkaline Phosphatase: 69 U/L (ref 38–126)
Anion gap: 9 (ref 5–15)
BUN: 21 mg/dL (ref 8–23)
CO2: 26 mmol/L (ref 22–32)
Calcium: 9.2 mg/dL (ref 8.9–10.3)
Chloride: 104 mmol/L (ref 98–111)
Creatinine, Ser: 0.92 mg/dL (ref 0.61–1.24)
GFR calc Af Amer: >60 mL/min (ref >60)
GFR calc non Af Amer: >60 mL/min (ref >60)
Glucose, Bld: 100 mg/dL — ABNORMAL HIGH (ref 70–99)
Potassium: 4.8 mmol/L (ref 3.5–5.1)
Sodium: 139 mmol/L (ref 135–145)
Total Bilirubin: 1 mg/dL (ref 0.3–1.2)
Total Protein: 7.4 g/dL (ref 6.5–8.1)

## 2019-09-12 LAB — HEMOGLOBIN A1C
Hgb A1c MFr Bld: 6.1 % — ABNORMAL HIGH (ref 4.8–5.6)
Mean Plasma Glucose: 128.37 mg/dL

## 2019-09-12 LAB — APTT: aPTT: 30 seconds (ref 24–36)

## 2019-09-12 LAB — SURGICAL PCR SCREEN
MRSA, PCR: NEGATIVE
Staphylococcus aureus: POSITIVE — AB

## 2019-09-12 NOTE — Progress Notes (Signed)
PCR results sent to Dr. Aluisio 

## 2019-09-12 NOTE — H&P (Signed)
TOTAL HIP ADMISSION H&P  Patient is admitted for right total hip arthroplasty.  Subjective:  Chief Complaint: right hip pain  HPI: Jeffery Thomas, 68 y.o. male, has a history of pain and functional disability in the right hip(s) due to arthritis and patient has failed non-surgical conservative treatments for greater than 12 weeks to include NSAID's and/or analgesics and activity modification.  Onset of symptoms was gradual starting 2 years ago with gradually worsening course since that time.The patient noted no past surgery on the right hip(s).  Patient currently rates pain in the right hip at 7 out of 10 with activity. Patient has worsening of pain with activity and weight bearing and pain that interfers with activities of daily living. Patient has evidence of severe bone-on-bone arthritis with subchondral cystic formation and large marginal osteophyte formation. by imaging studies. This condition presents safety issues increasing the risk of falls.   There is no current active infection.  Patient Active Problem List   Diagnosis Date Noted  . Preoperative clearance 09/03/2019  . Right hip pain 05/27/2019  . Left inguinal hernia 05/07/2019  . Scrotal swelling 12/12/2018  . Acute left-sided low back pain without sciatica 08/07/2018  . ICD (implantable cardioverter-defibrillator) battery depletion 07/20/2018  . Well adult exam 07/17/2018  . Neoplasm of uncertain behavior of skin 07/17/2018  . 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 Hyperlipidemia 10/24/2017  . Benign essential HTN   . History of cardiac arrest   . Cardiac device in situ   . Gastroesophageal reflux disease   . Acute systolic congestive heart failure ([place])   . Prediabetes   . Bradycardia   . Ventricular fibrillation ([place]) July 26, 2017  . Sudden cardiac death ([place]) 2017-07-26  . ST elevation myocardial infarction (STEMI) [facility])    Past Medical History:  Diagnosis Date  . Chronic systolic CHF (congestive heart  failure) ([place])   . Gastroesophageal reflux disease   . History of alcohol abuse    quit 08/ 20/ 2018  . History of cardiac arrest   . History of encephalopathy 07/05/2017   anoxic-ischemic   . History of ST elevation myocardial infarction (STEMI) 2017-07-26   sudden cardiac arrest /  per cardiac cath 26-Jul-2017 normal coronaries, ef <20% , severe LVSD; dx takatsuki syndrome  . History of sudden cardiac arrest 07/26/2017   VF arrest / STEMI   . Hypertension   . 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 Inguinal hernia    left  . Left hydrocele   . Left inguinal hernia   . Left inguinal hernia 05/07/2019  . Myocardial infarction ([place])   . NICM (nonischemic cardiomyopathy) ([place])    2017-07-26  per cardiac cath , ef <20% (echo 35-40%)  ;  last echo 10-09-2014 ef 60-65%  . OSA (obstructive sleep apnea)    03-26-2019 per pt had sleep study after heart attack, was told no recommendation , but did stop smoking and alcohol  . Pre-diabetes    denies  . 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 CATARACT EXTRACTION W/ INTRAOCULAR LENS IMPLANT Left 2017  . COLONOSCOPY    . HYDROCELE EXCISION Left 04/02/2019   Procedure: HYDROCELECTOMY ADULT;  Surgeon: Festus Aloe, MD;  [facility];  Service: Urology;  Laterality: Left;  .  ICD GENERATOR CHANGEOUT N/A 07/22/2018   Procedure: [place];  Surgeon: Evans Lance, MD;  Location: [facility];  Service: Cardiovascular;  Laterality: N/A;  . ICD IMPLANT N/A 07/04/2017   SJM Fortify Assura VR ICD implanted by Dr Rayann Heman for secondary prevention after VF arrest  . INGUINAL HERNIA REPAIR Left 05/07/2019   Procedure: OPEN REPAIR LEFT INGUINAL HERNIA WITH MESH;  Surgeon: Fanny Skates, MD;   Location: [facility];  Service: General;  Laterality: Left;  . LEFT HEART CATH AND CORONARY ANGIOGRAPHY N/A 06/26/2017   Procedure: LEFT HEART CATH AND CORONARY ANGIOGRAPHY;  Surgeon: Lorretta Harp, MD;  Location: [facility];  Service: Cardiovascular;  Laterality: N/A;  . MENISCUS REPAIR Left 1983   left knee  . TOTAL KNEE ARTHROPLASTY Left 2009  approx.  Marland Kitchen TOTAL KNEE REVISION Left 04/18/2018   Procedure: LEFT TOTAL KNEE REVISION;  Surgeon: Gaynelle Arabian, MD;  Location: [facility];  Service: Orthopedics;  Laterality: Left;  Adductor Block    No current facility-administered medications for this encounter.    Current Outpatient Medications  Medication Sig Dispense Refill Last Dose  . atorvastatin (LIPITOR) 20 MG tablet TAKE 1 TABLET DAILY (Patient taking differently: Take 20 mg by mouth daily. ) 90 tablet 0   . carvedilol (COREG) 25 MG tablet Take 0.5 tablets (12.5 mg total) by mouth 2 (two) times daily. 90 tablet 1   . ibuprofen (ADVIL,MOTRIN) 200 MG tablet Take 400 mg by mouth every 6 (six) hours as needed for headache or mild pain (pain).      Marland Kitchen losartan (COZAAR) 100 MG tablet TAKE 1 TABLET DAILY (Patient taking differently: Take 100 mg by mouth daily. ) 90 tablet 3   . meloxicam (MOBIC) 15 MG tablet Take 1 tablet (15 mg total) by mouth daily. 90 tablet 0   . montelukast (SINGULAIR) 10 MG tablet Take 1 tablet (10 mg total) by mouth daily. 90 tablet 2   . omeprazole (PRILOSEC) 40 MG capsule Take 1 capsule (40 mg total) by mouth daily. 90 capsule 2   . spironolactone (ALDACTONE) 25 MG tablet TAKE 1 TABLET DAILY (Patient taking differently: Take 25 mg by mouth daily. ) 90 tablet 3    No Known Allergies  Social History   Tobacco Use  . Smoking status: Former Smoker    Types: Cigars    Quit date: 06/26/2017    Years since quitting: 2.2  . Smokeless tobacco: Never Used  Substance Use Topics  . Alcohol use: Not Currently    Comment: quit 2018    Family History  Problem Relation Age of  Onset  . Hypertension Mother   . Diabetes Father   . Cancer Brother        pancreatic     Review of Systems  Constitutional: Negative for chills and fever.  Respiratory: Negative for cough and shortness of breath.   Cardiovascular: Negative for chest pain and palpitations.  Gastrointestinal: Negative for nausea and vomiting.  Musculoskeletal: Positive for joint pain.    Objective:  Physical Exam Patient is a 68 year old male.  Well nourished and well developed. General: Alert and oriented x3, cooperative and pleasant, no acute distress. Head: normocephalic, atraumatic, neck supple. Eyes: EOMI. Respiratory: breath sounds clear in all fields, no wheezing, rales, or rhonchi. Cardiovascular: Regular rate and rhythm, no murmurs, gallops or rubs. Abdomen: non-tender to palpation and soft, normoactive bowel sounds.  Musculoskeletal: Right Hip Exam: ROM: Flexion to 90, Internal Rotation 0, External Rotation  0, and Abduction 15 degrees. There is no tenderness over the greater trochanter bursa.  Left Hip Exam: ROM: Normal without discomfort. There is no tenderness over the greater trochanter bursa.  Calves soft and nontender. Motor function intact in LE. Strength 5/5 LE bilaterally. Neuro: Distal pulses 2+. Sensation to light touch intact in LE.  Vital signs in last 24 hours: Temp:  [98.4 F (36.9 C)] 98.4 F (36.9 C) (11/05 0925) Pulse Rate:  [63] 63 (11/05 0925) Resp:  [18] 18 (11/05 0925) BP: (173)/(88) 173/88 (11/05 0925) SpO2:  [99 %] 99 % (11/05 0925) Weight:  [94.4 kg] 94.4 kg (11/05 0925)  Labs:   Estimated body mass index is 28.23 kg/m as calculated from the following:   Height as of 09/12/19: 6' (1.829 m).   Weight as of 09/12/19: 94.4 kg.   Imaging Review Plain radiographs demonstrate severe degenerative joint disease of the right hip(s). The bone quality appears to be adequate for age and reported activity level.  Assessment/Plan:  End stage arthritis,  right hip(s)  The patient history, physical examination, clinical judgement of the provider and imaging studies are consistent with end stage degenerative joint disease of the right hip(s) and total hip arthroplasty is deemed medically necessary. The treatment options including medical management, injection therapy, arthroscopy and arthroplasty were discussed at length. The risks and benefits of total hip arthroplasty were presented and reviewed. The risks due to aseptic loosening, infection, stiffness, dislocation/subluxation,  thromboembolic complications and other imponderables were discussed.  The patient acknowledged the explanation, agreed to proceed with the plan and consent was signed. Patient is being admitted for inpatient treatment for surgery, pain control, PT, OT, prophylactic antibiotics, VTE prophylaxis, progressive ambulation and ADL's and discharge planning.The patient is planning to be discharged home.  Therapy Plans: HEP Disposition: Home with wife Planned DVT Prophylaxis: Xarelto 10mg  daily DME needed: none PCP: Dr. Luetta Nutting, clearance received Cardiologist: Dr. Quay Burow, clearance received TXA: IV Allergies: NKDA Anesthesia Concerns: none BMI: 26.4  Other: Hx of MI in 2018. Hx of defibrillator.   - Patient was instructed on what medications to stop prior to surgery. - Follow-up visit in 2 weeks with Dr. Wynelle Link - Begin physical therapy following surgery - Pre-operative lab work as pre-surgical testing - Prescriptions will be provided in hospital at time of discharge  Griffith Citron, PA-C Orthopedic Surgery [facility] [phone]

## 2019-09-14 ENCOUNTER — Other Ambulatory Visit (HOSPITAL_COMMUNITY)
Admission: RE | Admit: 2019-09-14 | Discharge: 2019-09-14 | Disposition: A | Discharge status: Home / Self Care | Payer: Medicare Other | Source: Ambulatory Visit | Attending: Orthopedic Surgery | Admitting: Orthopedic Surgery

## 2019-09-14 DIAGNOSIS — Z01812 Encounter for preprocedural laboratory examination: Secondary | ICD-10-CM | POA: Diagnosis present

## 2019-09-14 DIAGNOSIS — Z20828 Contact with and (suspected) exposure to other viral communicable diseases: Secondary | ICD-10-CM | POA: Diagnosis not present

## 2019-09-15 LAB — NOVEL CORONAVIRUS, NAA (HOSP ORDER, SEND-OUT TO REF LAB; TAT 18-24 HRS): SARS-CoV-2, NAA: NOT DETECTED

## 2019-09-16 ENCOUNTER — Encounter (HOSPITAL_COMMUNITY): Payer: Self-pay | Admitting: *Deleted

## 2019-09-17 NOTE — Anesthesia Preprocedure Evaluation (Addendum)
Anesthesia Evaluation  Patient identified by MRN, date of birth, ID band Patient awake    Reviewed: Allergy & Precautions, NPO status , Patient's Chart, lab work & pertinent test results, reviewed documented beta blocker date and time   History of Anesthesia Complications Negative for: history of anesthetic complications  Airway Mallampati: II  TM Distance: >3 FB Neck ROM: Full    Dental  (+) Dental Advisory Given, Teeth Intact   Pulmonary sleep apnea , former smoker,    Pulmonary exam normal        Cardiovascular hypertension, Pt. on home beta blockers and Pt. on medications + Past MI  Normal cardiovascular exam+ Cardiac Defibrillator    Pt last seen by cardiologist, Dr. Quay Burow, 09/03/2019.  Per OV note, "History of VF arrest 06/26/2017 with defibrillation and ROSC of 20 minutes.  Cardiac cath performed by myself revealed normal coronary arteries with severe LV dysfunction.  His EF normalized on guideline directed medical therapy by 2D echo 10/09/2017.  He denies chest pain or shortness of breath.He does have known normal coronary arteries.  His last 2D echo performed 10/09/2017 showed normal EF.  He does have an ICD in place which is not discharged for any reason.  Get a repeat a 2D echocardiogram and if this is normal I will clear him at low risk for his upcoming orthopedic surgical procedure."    '20 TTE - EF 60 to 65%. Grade I diastolic dysfunction (impaired relaxation). Trivial TR. A pacer wire is visualized.    Neuro/Psych negative neurological ROS  negative psych ROS   GI/Hepatic GERD  Medicated and Controlled,(+)     substance abuse  alcohol use,   Endo/Other  negative endocrine ROS  Renal/GU negative Renal ROS     Musculoskeletal  (+) Arthritis ,   Abdominal   Peds  Hematology negative hematology ROS (+)   Anesthesia Other Findings   Reproductive/Obstetrics                            Anesthesia Physical Anesthesia Plan  ASA: III  Anesthesia Plan: Spinal   Post-op Pain Management:    Induction:   PONV Risk Score and Plan: 1 and Treatment may vary due to age or medical condition and Propofol infusion  Airway Management Planned: Natural Airway and Simple Face Mask  Additional Equipment: None  Intra-op Plan:   Post-operative Plan:   Informed Consent: I have reviewed the patients History and Physical, chart, labs and discussed the procedure including the risks, benefits and alternatives for the proposed anesthesia with the patient or authorized representative who has indicated his/her understanding and acceptance.       Plan Discussed with: CRNA and Anesthesiologist  Anesthesia Plan Comments: (Labs reviewed, platelets acceptable. Discussed risks and benefits of spinal, including spinal/epidural hematoma, infection, failed block, and PDPH. Patient expressed understanding and wished to proceed. )      Anesthesia Quick Evaluation

## 2019-09-17 NOTE — Progress Notes (Addendum)
Anesthesia Chart Review   Case: [id number] Date/Time: 09/18/19 0955   Procedure: TOTAL HIP ARTHROPLASTY ANTERIOR APPROACH (Right Hip)   Anesthesia type: Choice   Pre-op diagnosis: right hip osteoarthritis   Location: [facility] 09 / [facility]   Surgeon: Gaynelle Arabian, MD      DISCUSSION:68 y.o. former smoker (quit 06/26/17) with h/o GERD controlled with medication, HTN, pre-diabetes, alcohol abuse (sober since 2018), OSA (does not use device), COPD, [employer] Jude ICD implanted 07/2017 after episode of sudden cardiac death (h/o VT arrest, device has never shocked him, device orders on chart), right hip OA scheduled for above procedure 09/18/2019 with Dr. Gaynelle Arabian.   Pt last seen by cardiologist, Dr. Quay Burow, 09/03/2019.  Per OV note, "History of VF arrest 06/26/2017 with defibrillation and ROSC of 20 minutes.  Cardiac cath performed by myself revealed normal coronary arteries with severe LV dysfunction.  His EF normalized on guideline directed medical therapy by 2D echo 10/09/2017.  He denies chest pain or shortness of breath.He does have known normal coronary arteries.  His last 2D echo performed 10/09/2017 showed normal EF.  He does have an ICD in place which is not discharged for any reason.  Get a repeat a 2D echocardiogram and if this is normal I will clear him at low risk for his upcoming orthopedic surgical procedure."  Echo 09/10/2019 with EF 60-65%, valves ok, stable.    S/p left inguinal hernia repair with GA 05/07/2019 with no anesthesia complications noted.    Anticipate pt can proceed with planned procedure barring acute status change.    VS: BP (!) 173/88 (BP Location: Left Arm)   Pulse 63   Temp 36.9 C (Oral)   Resp 18   Ht 6' (1.829 m)   Wt 94.4 kg   SpO2 99%   BMI 28.23 kg/m   PROVIDERS: Luetta Nutting, DO is PCP   Quay Burow, MD is Cardiologist  LABS: Labs reviewed: Acceptable for surgery. (all labs ordered are listed, but only abnormal results are  displayed)  Labs Reviewed  SURGICAL PCR SCREEN - Abnormal; Notable for the following components:      Result Value   Staphylococcus aureus POSITIVE (*)    All other components within normal limits  COMPREHENSIVE METABOLIC PANEL - Abnormal; Notable for the following components:   Glucose, Bld 100 (*)    All other components within normal limits  HEMOGLOBIN A1C - Abnormal; Notable for the following components:   Hgb A1c MFr Bld 6.1 (*)    All other components within normal limits  APTT  CBC  PROTIME-INR  TYPE AND SCREEN     IMAGES:   EKG: 09/03/2019 Rate 50 bpm Sinus bradycardia   CV: Echo 09/10/2019 IMPRESSIONS   1. Left ventricular ejection fraction, by visual estimation, is 60 to 65%. The left ventricle has normal function. There is no left ventricular hypertrophy.  2. Left ventricular diastolic parameters are consistent with Grade I diastolic dysfunction (impaired relaxation).  3. Global right ventricle has normal systolic function.The right ventricular size is normal. No increase in right ventricular wall thickness.  4. Left atrial size was normal.  5. Right atrial size was normal.  6. The mitral valve is normal in structure. No evidence of mitral valve regurgitation. No evidence of mitral stenosis.  7. The tricuspid valve is normal in structure. Tricuspid valve regurgitation is trivial.  8. The aortic valve is normal in structure. Aortic valve regurgitation is not visualized. No evidence of aortic valve sclerosis or  stenosis.  9. The pulmonic valve was normal in structure. Pulmonic valve regurgitation is not visualized. 10. Normal pulmonary artery systolic pressure. 11. A pacer wire is visualized. 12. The inferior vena cava is normal in size with greater than 50% respiratory variability, suggesting right atrial pressure of 3 mmHg. Past Medical History:  Diagnosis Date  . AICD (automatic cardioverter/defibrillator) present   . Chronic systolic CHF (congestive heart  failure) ([place])   . Gastroesophageal reflux disease   . History of alcohol abuse    quit 08/ 20/ 2018  . History of cardiac arrest   . History of encephalopathy 07/05/2017   anoxic-ischemic   . History of ST elevation myocardial infarction (STEMI) 06/26/2017   sudden cardiac arrest /  per cardiac cath 06-26-2017 normal coronaries, ef <20% , severe LVSD; dx takatsuki syndrome  . History of sudden cardiac arrest 06/26/2017   VF arrest / STEMI   . Hypertension   . 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 Inguinal hernia    left  . Left hydrocele   . Left inguinal hernia   . Left inguinal hernia 05/07/2019  . Myocardial infarction ([place])   . NICM (nonischemic cardiomyopathy) ([place])    06-26-2017  per cardiac cath , ef <20% (echo 35-40%)  ;  last echo 10-09-2014 ef 60-65%  . OSA (obstructive sleep apnea)    03-26-2019 per pt had sleep study after heart attack, was told no recommendation , but did stop smoking and alcohol  . Pre-diabetes    denies  . 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 CATARACT EXTRACTION W/ INTRAOCULAR LENS IMPLANT Left 2017  . COLONOSCOPY    . HYDROCELE EXCISION Left 04/02/2019   Procedure: HYDROCELECTOMY ADULT;  Surgeon: Festus Aloe, MD;  Location: [facility];  Service: Urology;  Laterality: Left;  . ICD GENERATOR CHANGEOUT N/A 07/22/2018   Procedure: [place];  Surgeon: Evans Lance, MD;  Location: [facility];  Service: Cardiovascular;  Laterality: N/A;  . ICD IMPLANT N/A 07/04/2017   SJM Fortify Assura VR ICD implanted by Dr Rayann Heman for secondary prevention after VF arrest  . INGUINAL HERNIA REPAIR Left 05/07/2019   Procedure: OPEN REPAIR LEFT INGUINAL HERNIA WITH MESH;  Surgeon: Fanny Skates, MD;   Location: [facility];  Service: General;  Laterality: Left;  . LEFT HEART CATH AND CORONARY ANGIOGRAPHY N/A 06/26/2017   Procedure: LEFT HEART CATH AND CORONARY ANGIOGRAPHY;  Surgeon: Lorretta Harp, MD;  Location: [facility];  Service: Cardiovascular;  Laterality: N/A;  . MENISCUS REPAIR Left 1983   left knee  . TOTAL KNEE ARTHROPLASTY Left 2009  approx.  Marland Kitchen TOTAL KNEE REVISION Left 04/18/2018   Procedure: LEFT TOTAL KNEE REVISION;  Surgeon: Gaynelle Arabian, MD;  Location: [facility];  Service: Orthopedics;  Laterality: Left;  Adductor Block    MEDICATIONS: . atorvastatin (LIPITOR) 20 MG tablet  . carvedilol (COREG) 25 MG tablet  . ibuprofen (ADVIL,MOTRIN) 200 MG tablet  . losartan (COZAAR) 100 MG tablet  . meloxicam (MOBIC) 15 MG tablet  . montelukast (SINGULAIR) 10 MG tablet  . omeprazole (PRILOSEC) 40 MG capsule  . spironolactone (ALDACTONE) 25 MG tablet   No current facility-administered medications for this encounter.     Konrad Felix, PA-C [facility]  Pre-Surgical Testing [phone] 09/17/19  11:40 AM

## 2019-09-17 NOTE — Progress Notes (Signed)
Pt advised that surgery time for his 09-18-19 is now 10:10 AM. Pt to arrive to admitting at 7:40 AM, and to remain on a Clear Liquid Diet until 7:10 AM. Pt verbalized understanding.

## 2019-09-18 ENCOUNTER — Inpatient Hospital Stay (HOSPITAL_COMMUNITY): Payer: Medicare Other | Admitting: Anesthesiology

## 2019-09-18 ENCOUNTER — Inpatient Hospital Stay (HOSPITAL_COMMUNITY): Payer: Medicare Other

## 2019-09-18 ENCOUNTER — Encounter (HOSPITAL_COMMUNITY): Payer: Self-pay | Admitting: *Deleted

## 2019-09-18 ENCOUNTER — Observation Stay (HOSPITAL_COMMUNITY)
Admission: RE | Admit: 2019-09-18 | Discharge: 2019-09-19 | Disposition: A | Discharge status: Home / Self Care | Payer: Medicare Other | Attending: Orthopedic Surgery | Admitting: Orthopedic Surgery

## 2019-09-18 ENCOUNTER — Inpatient Hospital Stay (HOSPITAL_COMMUNITY): Payer: Medicare Other | Admitting: Physician Assistant

## 2019-09-18 ENCOUNTER — Encounter (HOSPITAL_COMMUNITY)
Admission: RE | Disposition: A | Discharge status: Home / Self Care | Payer: Self-pay | Source: Home / Self Care | Attending: Orthopedic Surgery

## 2019-09-18 ENCOUNTER — Other Ambulatory Visit: Payer: Self-pay

## 2019-09-18 DIAGNOSIS — Z791 Long term (current) use of non-steroidal anti-inflammatories (NSAID): Secondary | ICD-10-CM | POA: Diagnosis not present

## 2019-09-18 DIAGNOSIS — Z95810 Presence of automatic (implantable) cardiac defibrillator: Secondary | ICD-10-CM | POA: Insufficient documentation

## 2019-09-18 DIAGNOSIS — G4733 Obstructive sleep apnea (adult) (pediatric): Secondary | ICD-10-CM | POA: Insufficient documentation

## 2019-09-18 DIAGNOSIS — Z419 Encounter for procedure for purposes other than remedying health state, unspecified: Secondary | ICD-10-CM

## 2019-09-18 DIAGNOSIS — I5022 Chronic systolic (congestive) heart failure: Secondary | ICD-10-CM | POA: Insufficient documentation

## 2019-09-18 DIAGNOSIS — M6281 Muscle weakness (generalized): Secondary | ICD-10-CM | POA: Diagnosis not present

## 2019-09-18 DIAGNOSIS — M169 Osteoarthritis of hip, unspecified: Secondary | ICD-10-CM | POA: Diagnosis present

## 2019-09-18 DIAGNOSIS — M25751 Osteophyte, right hip: Secondary | ICD-10-CM | POA: Diagnosis not present

## 2019-09-18 DIAGNOSIS — K219 Gastro-esophageal reflux disease without esophagitis: Secondary | ICD-10-CM | POA: Insufficient documentation

## 2019-09-18 DIAGNOSIS — Z8674 Personal history of sudden cardiac arrest: Secondary | ICD-10-CM | POA: Diagnosis not present

## 2019-09-18 DIAGNOSIS — I110 Hypertensive heart disease with heart failure: Secondary | ICD-10-CM | POA: Diagnosis not present

## 2019-09-18 DIAGNOSIS — M1611 Unilateral primary osteoarthritis, right hip: Secondary | ICD-10-CM | POA: Diagnosis present

## 2019-09-18 DIAGNOSIS — Z79899 Other long term (current) drug therapy: Secondary | ICD-10-CM | POA: Diagnosis not present

## 2019-09-18 DIAGNOSIS — Z87891 Personal history of nicotine dependence: Secondary | ICD-10-CM | POA: Diagnosis not present

## 2019-09-18 DIAGNOSIS — Z96649 Presence of unspecified artificial hip joint: Secondary | ICD-10-CM

## 2019-09-18 DIAGNOSIS — Z8249 Family history of ischemic heart disease and other diseases of the circulatory system: Secondary | ICD-10-CM | POA: Insufficient documentation

## 2019-09-18 DIAGNOSIS — I252 Old myocardial infarction: Secondary | ICD-10-CM | POA: Insufficient documentation

## 2019-09-18 HISTORY — DX: Presence of automatic (implantable) cardiac defibrillator: Z95.810

## 2019-09-18 HISTORY — PX: TOTAL HIP ARTHROPLASTY: SHX124

## 2019-09-18 LAB — TYPE AND SCREEN
ABO/RH(D): O POS
Antibody Screen: NEGATIVE

## 2019-09-18 SURGERY — ARTHROPLASTY, HIP, TOTAL, ANTERIOR APPROACH
Anesthesia: Spinal | Site: Hip | Laterality: Right

## 2019-09-18 MED ORDER — CHLORHEXIDINE GLUCONATE 4 % EX LIQD: 60.0000 mL | Freq: Once | CUTANEOUS | Status: DC

## 2019-09-18 MED ORDER — OXYCODONE HCL 5 MG/5ML PO SOLN: 5.0000 mg | Freq: Once | ORAL | Status: DC | PRN

## 2019-09-18 MED ORDER — POVIDONE-IODINE 10 % EX SWAB
2.0000 "application " | Freq: Once | CUTANEOUS | Status: AC
  Administered 2019-09-18: 2 via TOPICAL

## 2019-09-18 MED ORDER — RIVAROXABAN 10 MG PO TABS
10.0000 mg | ORAL_TABLET | Freq: Every day | ORAL | Status: DC
  Administered 2019-09-19: 09:00:00 10 mg via ORAL
  Filled 2019-09-18: qty 1

## 2019-09-18 MED ORDER — MORPHINE SULFATE (PF) 2 MG/ML IV SOLN: 0.5000 mg | INTRAVENOUS | Status: DC | PRN

## 2019-09-18 MED ORDER — PROPOFOL 500 MG/50ML IV EMUL
INTRAVENOUS | Status: AC
  Filled 2019-09-18: qty 50

## 2019-09-18 MED ORDER — DEXAMETHASONE SODIUM PHOSPHATE 10 MG/ML IJ SOLN
10.0000 mg | Freq: Once | INTRAMUSCULAR | Status: AC
  Administered 2019-09-19: 10 mg via INTRAVENOUS
  Filled 2019-09-18: qty 1

## 2019-09-18 MED ORDER — MONTELUKAST SODIUM 10 MG PO TABS
10.0000 mg | ORAL_TABLET | Freq: Every day | ORAL | Status: DC
  Administered 2019-09-19: 10 mg via ORAL
  Filled 2019-09-18: qty 1

## 2019-09-18 MED ORDER — ONDANSETRON HCL 4 MG/2ML IJ SOLN
4.0000 mg | Freq: Once | INTRAMUSCULAR | Status: AC | PRN
  Administered 2019-09-18: 4 mg via INTRAVENOUS

## 2019-09-18 MED ORDER — PHENYLEPHRINE HCL (PRESSORS) 10 MG/ML IV SOLN
INTRAVENOUS | Status: AC
  Filled 2019-09-18: qty 1

## 2019-09-18 MED ORDER — PROPOFOL 500 MG/50ML IV EMUL
INTRAVENOUS | Status: DC | PRN
  Administered 2019-09-18: 75 ug/kg/min via INTRAVENOUS

## 2019-09-18 MED ORDER — MENTHOL 3 MG MT LOZG: 1.0000 | LOZENGE | OROMUCOSAL | Status: DC | PRN

## 2019-09-18 MED ORDER — SODIUM CHLORIDE 0.9 % IV SOLN
INTRAVENOUS | Status: DC
  Administered 2019-09-18: 14:00:00 via INTRAVENOUS

## 2019-09-18 MED ORDER — BUPIVACAINE IN DEXTROSE 0.75-8.25 % IT SOLN
INTRATHECAL | Status: DC | PRN
  Administered 2019-09-18: 1.6 mL via INTRATHECAL

## 2019-09-18 MED ORDER — 0.9 % SODIUM CHLORIDE (POUR BTL) OPTIME
TOPICAL | Status: DC | PRN
  Administered 2019-09-18: 10:00:00 1000 mL

## 2019-09-18 MED ORDER — METOCLOPRAMIDE HCL 5 MG/ML IJ SOLN: 5.0000 mg | Freq: Three times a day (TID) | INTRAMUSCULAR | Status: DC | PRN

## 2019-09-18 MED ORDER — DEXAMETHASONE SODIUM PHOSPHATE 10 MG/ML IJ SOLN
INTRAMUSCULAR | Status: AC
  Filled 2019-09-18: qty 1

## 2019-09-18 MED ORDER — BISACODYL 10 MG RE SUPP: 10.0000 mg | Freq: Every day | RECTAL | Status: DC | PRN

## 2019-09-18 MED ORDER — FENTANYL CITRATE (PF) 100 MCG/2ML IJ SOLN: 25.0000 ug | INTRAMUSCULAR | Status: DC | PRN

## 2019-09-18 MED ORDER — MIDAZOLAM HCL 5 MG/5ML IJ SOLN
INTRAMUSCULAR | Status: DC | PRN
  Administered 2019-09-18: 2 mg via INTRAVENOUS

## 2019-09-18 MED ORDER — METHOCARBAMOL 500 MG IVPB - SIMPLE MED
500.0000 mg | Freq: Four times a day (QID) | INTRAVENOUS | Status: DC | PRN
  Filled 2019-09-18: qty 50

## 2019-09-18 MED ORDER — ONDANSETRON HCL 4 MG/2ML IJ SOLN
INTRAMUSCULAR | Status: AC
  Filled 2019-09-18: qty 2

## 2019-09-18 MED ORDER — HYDROCODONE-ACETAMINOPHEN 5-325 MG PO TABS
1.0000 | ORAL_TABLET | ORAL | Status: DC | PRN
  Administered 2019-09-18 – 2019-09-19 (×4): 2 via ORAL
  Filled 2019-09-18 (×4): qty 2

## 2019-09-18 MED ORDER — POLYETHYLENE GLYCOL 3350 17 G PO PACK: 17.0000 g | PACK | Freq: Every day | ORAL | Status: DC | PRN

## 2019-09-18 MED ORDER — PROPOFOL 10 MG/ML IV BOLUS
INTRAVENOUS | Status: AC
  Filled 2019-09-18: qty 20

## 2019-09-18 MED ORDER — DIPHENHYDRAMINE HCL 12.5 MG/5ML PO ELIX: 12.5000 mg | ORAL_SOLUTION | ORAL | Status: DC | PRN

## 2019-09-18 MED ORDER — PROPOFOL 500 MG/50ML IV EMUL
INTRAVENOUS | Status: DC | PRN
  Administered 2019-09-18 (×3): 20 mg via INTRAVENOUS
  Administered 2019-09-18: 10 mg via INTRAVENOUS

## 2019-09-18 MED ORDER — DEXAMETHASONE SODIUM PHOSPHATE 10 MG/ML IJ SOLN
8.0000 mg | Freq: Once | INTRAMUSCULAR | Status: AC
  Administered 2019-09-18: 8 mg via INTRAVENOUS

## 2019-09-18 MED ORDER — MIDAZOLAM HCL 2 MG/2ML IJ SOLN
INTRAMUSCULAR | Status: AC
  Filled 2019-09-18: qty 2

## 2019-09-18 MED ORDER — ATORVASTATIN CALCIUM 20 MG PO TABS
20.0000 mg | ORAL_TABLET | Freq: Every day | ORAL | Status: DC
  Administered 2019-09-19: 20 mg via ORAL
  Filled 2019-09-18: qty 1

## 2019-09-18 MED ORDER — LOSARTAN POTASSIUM 50 MG PO TABS
100.0000 mg | ORAL_TABLET | Freq: Every day | ORAL | Status: DC
  Administered 2019-09-19: 100 mg via ORAL
  Filled 2019-09-18: qty 2

## 2019-09-18 MED ORDER — PANTOPRAZOLE SODIUM 40 MG PO TBEC
80.0000 mg | DELAYED_RELEASE_TABLET | Freq: Every day | ORAL | Status: DC
  Administered 2019-09-19: 09:00:00 80 mg via ORAL
  Filled 2019-09-18: qty 2

## 2019-09-18 MED ORDER — SPIRONOLACTONE 25 MG PO TABS
25.0000 mg | ORAL_TABLET | Freq: Every day | ORAL | Status: DC
  Filled 2019-09-18: qty 1

## 2019-09-18 MED ORDER — TRANEXAMIC ACID-NACL 1000-0.7 MG/100ML-% IV SOLN
1000.0000 mg | INTRAVENOUS | Status: AC
  Administered 2019-09-18: 1000 mg via INTRAVENOUS
  Filled 2019-09-18: qty 100

## 2019-09-18 MED ORDER — ACETAMINOPHEN 500 MG PO TABS
500.0000 mg | ORAL_TABLET | Freq: Four times a day (QID) | ORAL | Status: DC
  Administered 2019-09-18 – 2019-09-19 (×2): 500 mg via ORAL
  Filled 2019-09-18 (×2): qty 1

## 2019-09-18 MED ORDER — BUPIVACAINE HCL 0.25 % IJ SOLN
INTRAMUSCULAR | Status: DC | PRN
  Administered 2019-09-18: 30 mL

## 2019-09-18 MED ORDER — BUPIVACAINE HCL (PF) 0.25 % IJ SOLN
INTRAMUSCULAR | Status: AC
  Filled 2019-09-18: qty 30

## 2019-09-18 MED ORDER — TRAMADOL HCL 50 MG PO TABS: 50.0000 mg | ORAL_TABLET | Freq: Four times a day (QID) | ORAL | Status: DC | PRN

## 2019-09-18 MED ORDER — METOCLOPRAMIDE HCL 5 MG PO TABS: 5.0000 mg | ORAL_TABLET | Freq: Three times a day (TID) | ORAL | Status: DC | PRN

## 2019-09-18 MED ORDER — CARVEDILOL 12.5 MG PO TABS
12.5000 mg | ORAL_TABLET | Freq: Two times a day (BID) | ORAL | Status: DC
  Administered 2019-09-18 – 2019-09-19 (×2): 12.5 mg via ORAL
  Filled 2019-09-18 (×2): qty 1

## 2019-09-18 MED ORDER — CEFAZOLIN SODIUM-DEXTROSE 2-4 GM/100ML-% IV SOLN
2.0000 g | INTRAVENOUS | Status: AC
  Administered 2019-09-18: 2 g via INTRAVENOUS
  Filled 2019-09-18: qty 100

## 2019-09-18 MED ORDER — ONDANSETRON HCL 4 MG/2ML IJ SOLN: 4.0000 mg | Freq: Four times a day (QID) | INTRAMUSCULAR | Status: DC | PRN

## 2019-09-18 MED ORDER — OXYCODONE HCL 5 MG PO TABS: 5.0000 mg | ORAL_TABLET | Freq: Once | ORAL | Status: DC | PRN

## 2019-09-18 MED ORDER — FLEET ENEMA 7-19 GM/118ML RE ENEM: 1.0000 | ENEMA | Freq: Once | RECTAL | Status: DC | PRN

## 2019-09-18 MED ORDER — DOCUSATE SODIUM 100 MG PO CAPS
100.0000 mg | ORAL_CAPSULE | Freq: Two times a day (BID) | ORAL | Status: DC
  Administered 2019-09-18 – 2019-09-19 (×2): 100 mg via ORAL
  Filled 2019-09-18 (×2): qty 1

## 2019-09-18 MED ORDER — ACETAMINOPHEN 10 MG/ML IV SOLN
1000.0000 mg | Freq: Four times a day (QID) | INTRAVENOUS | Status: DC
  Administered 2019-09-18: 11:00:00 1000 mg via INTRAVENOUS
  Filled 2019-09-18: qty 100

## 2019-09-18 MED ORDER — LACTATED RINGERS IV SOLN
INTRAVENOUS | Status: DC
  Administered 2019-09-18 (×2): via INTRAVENOUS

## 2019-09-18 MED ORDER — METHOCARBAMOL 500 MG PO TABS
500.0000 mg | ORAL_TABLET | Freq: Four times a day (QID) | ORAL | Status: DC | PRN
  Administered 2019-09-18 (×2): 500 mg via ORAL
  Filled 2019-09-18 (×2): qty 1

## 2019-09-18 MED ORDER — PHENOL 1.4 % MT LIQD: 1.0000 | OROMUCOSAL | Status: DC | PRN

## 2019-09-18 MED ORDER — CEFAZOLIN SODIUM-DEXTROSE 2-4 GM/100ML-% IV SOLN
2.0000 g | Freq: Four times a day (QID) | INTRAVENOUS | Status: AC
  Administered 2019-09-18 (×2): 2 g via INTRAVENOUS
  Filled 2019-09-18 (×2): qty 100

## 2019-09-18 MED ORDER — ONDANSETRON HCL 4 MG PO TABS: 4.0000 mg | ORAL_TABLET | Freq: Four times a day (QID) | ORAL | Status: DC | PRN

## 2019-09-18 SURGICAL SUPPLY — 47 items
BAG DECANTER FOR FLEXI CONT (MISCELLANEOUS) IMPLANT
BAG ZIPLOCK 12X15 (MISCELLANEOUS) IMPLANT
BALL HIP CERAMIC (Hips) ×1 IMPLANT
BLADE SAG 18X100X1.27 (BLADE) ×2 IMPLANT
COVER PERINEAL POST (MISCELLANEOUS) ×2 IMPLANT
COVER SURGICAL LIGHT HANDLE (MISCELLANEOUS) ×2 IMPLANT
COVER WAND RF STERILE (DRAPES) IMPLANT
CUP ACETBLR 52 OD PINNACLE (Hips) ×2 IMPLANT
DECANTER SPIKE VIAL GLASS SM (MISCELLANEOUS) ×2 IMPLANT
DRAPE STERI IOBAN 125X83 (DRAPES) ×2 IMPLANT
DRAPE U-SHAPE 47X51 STRL (DRAPES) ×4 IMPLANT
DRSG ADAPTIC 3X8 NADH LF (GAUZE/BANDAGES/DRESSINGS) ×2 IMPLANT
DRSG MEPILEX BORDER 4X4 (GAUZE/BANDAGES/DRESSINGS) ×2 IMPLANT
DRSG MEPILEX BORDER 4X8 (GAUZE/BANDAGES/DRESSINGS) ×2 IMPLANT
DURAPREP 26ML APPLICATOR (WOUND CARE) ×2 IMPLANT
ELECT REM PT RETURN 15FT ADLT (MISCELLANEOUS) ×2 IMPLANT
EVACUATOR 1/8 PVC DRAIN (DRAIN) ×2 IMPLANT
GLOVE BIO SURGEON STRL SZ 6 (GLOVE) IMPLANT
GLOVE BIO SURGEON STRL SZ7 (GLOVE) IMPLANT
GLOVE BIO SURGEON STRL SZ8 (GLOVE) ×2 IMPLANT
GLOVE BIOGEL PI IND STRL 6.5 (GLOVE) IMPLANT
GLOVE BIOGEL PI IND STRL 7.0 (GLOVE) IMPLANT
GLOVE BIOGEL PI IND STRL 8 (GLOVE) ×1 IMPLANT
GLOVE BIOGEL PI INDICATOR 6.5 (GLOVE)
GLOVE BIOGEL PI INDICATOR 7.0 (GLOVE)
GLOVE BIOGEL PI INDICATOR 8 (GLOVE) ×1
GOWN STRL REUS W/TWL LRG LVL3 (GOWN DISPOSABLE) ×2 IMPLANT
GOWN STRL REUS W/TWL XL LVL3 (GOWN DISPOSABLE) IMPLANT
HIP BALL CERAMIC (Hips) ×2 IMPLANT
HOLDER FOLEY CATH W/STRAP (MISCELLANEOUS) ×2 IMPLANT
KIT TURNOVER KIT A (KITS) IMPLANT
LINER MARATHON NEUT +4X52X32 (Hips) ×2 IMPLANT
MANIFOLD NEPTUNE II (INSTRUMENTS) ×2 IMPLANT
PACK ANTERIOR HIP CUSTOM (KITS) ×2 IMPLANT
PENCIL SMOKE EVACUATOR COATED (MISCELLANEOUS) ×2 IMPLANT
STEM FEM ACTIS STD SZ7 (Nail) ×2 IMPLANT
STRIP CLOSURE SKIN 1/2X4 (GAUZE/BANDAGES/DRESSINGS) ×4 IMPLANT
SUT ETHIBOND NAB CT1 #1 30IN (SUTURE) ×2 IMPLANT
SUT MNCRL AB 4-0 PS2 18 (SUTURE) ×2 IMPLANT
SUT STRATAFIX 0 PDS 27 VIOLET (SUTURE) ×2
SUT VIC AB 2-0 CT1 27 (SUTURE) ×2
SUT VIC AB 2-0 CT1 TAPERPNT 27 (SUTURE) ×2 IMPLANT
SUTURE STRATFX 0 PDS 27 VIOLET (SUTURE) ×1 IMPLANT
SYR 50ML LL SCALE MARK (SYRINGE) IMPLANT
TRAY FOLEY MTR SLVR 16FR STAT (SET/KITS/TRAYS/PACK) ×2 IMPLANT
WATER STERILE IRR 1000ML POUR (IV SOLUTION) ×4 IMPLANT
YANKAUER SUCT BULB TIP 10FT TU (MISCELLANEOUS) ×2 IMPLANT

## 2019-09-18 NOTE — Interval H&P Note (Signed)
History and Physical Interval Note:  09/18/2019 8:19 AM  Jeffery Thomas  has presented today for surgery, with the diagnosis of right hip osteoarthritis.  The various methods of treatment have been discussed with the patient and family. After consideration of risks, benefits and other options for treatment, the patient has consented to  Procedure(s): TOTAL HIP ARTHROPLASTY ANTERIOR APPROACH (Right) as a surgical intervention.  The patient's history has been reviewed, patient examined, no change in status, stable for surgery.  I have reviewed the patient's chart and labs.  Questions were answered to the patient's satisfaction.     Pilar Plate Ezra Denne

## 2019-09-18 NOTE — Anesthesia Procedure Notes (Signed)
Spinal  Patient location during procedure: OR Start time: 09/18/2019 9:51 AM End time: 09/18/2019 9:53 AM Staffing Anesthesiologist: Audry Pili, MD Resident/CRNA: Glory Buff, CRNA Performed: resident/CRNA  Preanesthetic Checklist Completed: patient identified, site marked, surgical consent, pre-op evaluation, timeout performed, IV checked, risks and benefits discussed and monitors and equipment checked Spinal Block Patient position: sitting Prep: DuraPrep Patient monitoring: heart rate, cardiac monitor, continuous pulse ox and blood pressure Approach: midline Location: L3-4 Injection technique: single-shot Needle Needle type: Pencan  Needle gauge: 24 G Needle length: 9 cm Assessment Sensory level: T4 Additional Notes Kit expiration date checked and verified.  Sterile prep and drape, skin local with 1% lidocaine, - heme, - paraesthesia, + CSF pre and post injection, patient tolerated procedure well.

## 2019-09-18 NOTE — Anesthesia Postprocedure Evaluation (Signed)
Anesthesia Post Note  Patient: Jeffery Thomas  Procedure(s) Performed: TOTAL HIP ARTHROPLASTY ANTERIOR APPROACH (Right Hip)     Patient location during evaluation: PACU Anesthesia Type: Spinal Level of consciousness: awake and alert Pain management: pain level controlled Vital Signs Assessment: post-procedure vital signs reviewed and stable Respiratory status: spontaneous breathing and respiratory function stable Cardiovascular status: blood pressure returned to baseline and stable Postop Assessment: spinal receding and no apparent nausea or vomiting Anesthetic complications: no    Last Vitals:  Vitals:   09/18/19 1215 09/18/19 1236  BP: 120/70 135/78  Pulse: (!) 51 (!) 54  Resp: 14 14  Temp: (!) 36.4 C 36.4 C  SpO2: 100% 100%    Last Pain:  Vitals:   09/18/19 1236  TempSrc: Oral  PainSc: 0-No pain                 Audry Pili

## 2019-09-18 NOTE — Transfer of Care (Signed)
Immediate Anesthesia Transfer of Care Note  Patient: Jeffery Thomas  Procedure(s) Performed: TOTAL HIP ARTHROPLASTY ANTERIOR APPROACH (Right Hip)  Patient Location: PACU  Anesthesia Type:Spinal  Level of Consciousness: awake, alert  and oriented  Airway & Oxygen Therapy: Patient Spontanous Breathing and Patient connected to face mask oxygen  Post-op Assessment: Report given to RN and Post -op Vital signs reviewed and stable  Post vital signs: Reviewed and stable  Last Vitals:  Vitals Value Taken Time  BP 94/62 09/18/19 1119  Temp    Pulse 68 09/18/19 1120  Resp 15 09/18/19 1120  SpO2 99 % 09/18/19 1120  Vitals shown include unvalidated device data.  Last Pain:  Vitals:   09/18/19 0753  TempSrc: Oral         Complications: No apparent anesthesia complications

## 2019-09-18 NOTE — Evaluation (Signed)
Physical Therapy Evaluation Patient Details Name: Jeffery Thomas MRN: [id number] DOB: Jan 28, 1951 Today's Date: 09/18/2019   History of Present Illness  Patient is 68 y.o. 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    Clinical Impression  ENMANUEL RAUM is a 68 y.o. male POD 0 s/p Rt THA anterior approach. Patient reports independence with mobility at baseline. Patient is now limited by functional impairments (see PT problem list below) and requires min guard for transfers and gait with RW. Patient was able to ambulate ~120 feet with RW and min guard with no overt LOB noted and pt demonstrated safe use of RW. Patient instructed in exercise to facilitate ROM and circulation. Patient will benefit from continued skilled PT interventions to address impairments and progress towards PLOF. Acute PT will follow to progress mobility and stair training in preparation for safe discharge home.    Follow Up Recommendations Follow surgeon's recommendation for DC plan and follow-up therapies    Equipment Recommendations  None recommended by PT    Recommendations for Other Services       Precautions / Restrictions Precautions Precautions: Fall Restrictions Weight Bearing Restrictions: No      Mobility  Bed Mobility Overal bed mobility: Needs Assistance Bed Mobility: Supine to Sit     Supine to sit: Supervision;HOB elevated     General bed mobility comments: no assist required, pt using bed rails  Transfers Overall transfer level: Needs assistance Equipment used: Rolling walker (2 wheeled) Transfers: Sit to/from Stand Sit to Stand: Min guard         General transfer comment: cues for safe hand placement and technique wtih RW for power up, no assist required, pt steady with rising  Ambulation/Gait Ambulation/Gait assistance: Min guard Gait Distance (Feet): 120 Feet Assistive  device: Rolling walker (2 wheeled) Gait Pattern/deviations: Step-through pattern Gait velocity: WNL's   General Gait Details: cues for safe hand placement and step pattern within RW, no assist requried for safe use or to steady, no overt LOB noted  Stairs            Wheelchair Mobility    Modified Rankin (Stroke Patients Only)       Balance Overall balance assessment: Needs assistance Sitting-balance support: Feet supported;No upper extremity supported Sitting balance-Leahy Scale: Good     Standing balance support: During functional activity;Bilateral upper extremity supported Standing balance-Leahy Scale: Fair            Pertinent Vitals/Pain Pain Assessment: 0-10 Pain Score: 4  Pain Descriptors / Indicators: Sore;Discomfort Pain Intervention(s): Limited activity within patient's tolerance;Monitored during session;Repositioned;Ice applied    Home Living Family/patient expects to be discharged to:: Private residence Living Arrangements: Spouse/significant other Available Help at Discharge: Family;Available 24 hours/day Type of Home: House Home Access: Stairs to enter Entrance Stairs-Rails: None(has the wall of the house on one side) Entrance Stairs-Number of Steps: 2-3 Home Layout: One level Home Equipment: Walker - 2 wheels;Cane - single point;Shower seat - built in Additional Comments: built in Civil engineer, contracting in Special educational needs teacher and another walkin shower in basement    Prior Function Level of Independence: Independent         Comments: wants to get back to golfing     Hand Dominance   Dominant Hand: Right    Extremity/Trunk Assessment   Upper Extremity Assessment Upper Extremity Assessment: Overall WFL for tasks assessed    Lower  Extremity Assessment Lower Extremity Assessment: Overall WFL for tasks assessed    Cervical / Trunk Assessment Cervical / Trunk Assessment: Normal  Communication   Communication: No difficulties  Cognition  Arousal/Alertness: Awake/alert Behavior During Therapy: WFL for tasks assessed/performed Overall Cognitive Status: Within Functional Limits for tasks assessed      General Comments General comments (skin integrity, edema, etc.): pt'd hemovac leaking when first walking, RN notified and pt provided seated break. RN removed hemovac and applied dressing and pt was able to continue walking.    Exercises Total Joint Exercises Ankle Circles/Pumps: AROM;15 reps;Seated;Both Heel Slides: AROM;10 reps;Right;Seated Long Arc Quad: AROM;10 reps;Seated;Right   Assessment/Plan    PT Assessment Patient needs continued PT services  PT Problem List Decreased strength;Decreased balance;Decreased mobility;Decreased range of motion;Decreased activity tolerance;Decreased knowledge of use of DME       PT Treatment Interventions Functional mobility training;DME instruction;Balance training;Patient/family education;Modalities;Gait training;Therapeutic activities;Therapeutic exercise;Stair training    PT Goals (Current goals can be found in the Care Plan section)  Acute Rehab PT Goals Patient Stated Goal: get back to independence and get ack to golfing PT Goal Formulation: With patient Time For Goal Achievement: 09/25/19 Potential to Achieve Goals: Good    Frequency 7X/week    AM-PAC PT "6 Clicks" Mobility  Outcome Measure Help needed turning from your back to your side while in a flat bed without using bedrails?: A Little Help needed moving from lying on your back to sitting on the side of a flat bed without using bedrails?: A Little Help needed moving to and from a bed to a chair (including a wheelchair)?: A Little Help needed standing up from a chair using your arms (e.g., wheelchair or bedside chair)?: A Little Help needed to walk in hospital room?: A Little Help needed climbing 3-5 steps with a railing? : A Little 6 Click Score: 18    End of Session Equipment Utilized During Treatment: Gait  belt Activity Tolerance: Patient tolerated treatment well Patient left: in chair;with call bell/phone within reach;with chair alarm set Nurse Communication: Mobility status PT Visit Diagnosis: Muscle weakness (generalized) (M62.81);Difficulty in walking, not elsewhere classified (R26.2)    Time: 1627-1700 PT Time Calculation (min) (ACUTE ONLY): 33 min   Charges:   PT Evaluation $PT Eval Low Complexity: 1 Low PT Treatments $Therapeutic Exercise: 8-22 mins        Kipp Brood, PT, DPT Physical Therapist with [facility]  09/18/2019 5:16 PM

## 2019-09-18 NOTE — Anesthesia Procedure Notes (Signed)
Date/Time: 09/18/2019 9:47 AM Performed by: Glory Buff, CRNA Oxygen Delivery Method: Simple face mask

## 2019-09-18 NOTE — Discharge Instructions (Addendum)
Dr. Gaynelle Arabian Total Joint Specialist [facility] [address], [place], [place] [address] [phone]  ANTERIOR APPROACH TOTAL HIP REPLACEMENT POSTOPERATIVE DIRECTIONS   Hip Rehabilitation, Guidelines Following Surgery  The results of a hip operation are greatly improved after range of motion and muscle strengthening exercises. Follow all safety measures which are given to protect your hip. If any of these exercises cause increased pain or swelling in your joint, decrease the amount until you are comfortable again. Then slowly increase the exercises. Call your caregiver if you have problems or questions.   HOME CARE INSTRUCTIONS  Remove items at home which could result in a fall. This includes throw rugs or furniture in walking pathways.   ICE to the affected hip every three hours for 30 minutes at a time and then as needed for pain and swelling.  Continue to use ice on the hip for pain and swelling from surgery. You may notice swelling that will progress down to the foot and ankle.  This is normal after surgery.  Elevate the leg when you are not up walking on it.    Continue to use the breathing machine which will help keep your temperature down.  It is common for your temperature to cycle up and down following surgery, especially at night when you are not up moving around and exerting yourself.  The breathing machine keeps your lungs expanded and your temperature down.   DIET You may resume your previous home diet once your are discharged from the hospital.  DRESSING / WOUND CARE / SHOWERING You may shower 3 days after surgery, but keep the wounds dry during showering.  You may use an occlusive plastic wrap (Press'n Seal for example), NO SOAKING/SUBMERGING IN THE BATHTUB.  If the bandage gets wet, change with a clean dry gauze.  If the incision gets wet, pat the wound dry with a clean towel. You may start showering once you are discharged home but do not submerge  the incision under water. Just pat the incision dry and apply a dry gauze dressing on daily. Change the surgical dressing daily and reapply a dry dressing each time.  ACTIVITY Walk with your walker as instructed. Use walker as long as suggested by your caregivers. Avoid periods of inactivity such as sitting longer than an hour when not asleep. This helps prevent blood clots.  You may resume a sexual relationship in one month or when given the OK by your doctor.  You may return to work once you are cleared by your doctor.  Do not drive a car for 6 weeks or until released by you surgeon.  Do not drive while taking narcotics.  WEIGHT BEARING Weight bearing as tolerated with assist device (walker, cane, etc) as directed, use it as long as suggested by your surgeon or therapist, typically at least 4-6 weeks.  POSTOPERATIVE CONSTIPATION PROTOCOL Constipation - defined medically as fewer than three stools per week and severe constipation as less than one stool per week.  One of the most common issues patients have following surgery is constipation.  Even if you have a regular bowel pattern at home, your normal regimen is likely to be disrupted due to multiple reasons following surgery.  Combination of anesthesia, postoperative narcotics, change in appetite and fluid intake all can affect your bowels.  In order to avoid complications following surgery, here are some recommendations in order to help you during your recovery period.  Colace (docusate) - Pick up an over-the-counter  form of Colace or another stool softener and take twice a day as long as you are requiring postoperative pain medications.  Take with a full glass of water daily.  If you experience loose stools or diarrhea, hold the colace until you stool forms back up.  If your symptoms do not get better within 1 week or if they get worse, check with your doctor.  Dulcolax (bisacodyl) - Pick up over-the-counter and take as directed by the  product packaging as needed to assist with the movement of your bowels.  Take with a full glass of water.  Use this product as needed if not relieved by Colace only.   MiraLax (polyethylene glycol) - Pick up over-the-counter to have on hand.  MiraLax is a solution that will increase the amount of water in your bowels to assist with bowel movements.  Take as directed and can mix with a glass of water, juice, soda, coffee, or tea.  Take if you go more than two days without a movement. Do not use MiraLax more than once per day. Call your doctor if you are still constipated or irregular after using this medication for 7 days in a row.  If you continue to have problems with postoperative constipation, please contact the office for further assistance and recommendations.  If you experience "the worst abdominal pain ever" or develop nausea or vomiting, please contact the office immediatly for further recommendations for treatment.  ITCHING  If you experience itching with your medications, try taking only a single pain pill, or even half a pain pill at a time.  You can also use Benadryl over the counter for itching or also to help with sleep.   TED HOSE STOCKINGS Wear the elastic stockings on both legs for three weeks following surgery during the day but you may remove then at night for sleeping.  MEDICATIONS See your medication summary on the After Visit Summary that the nursing staff will review with you prior to discharge.  You may have some home medications which will be placed on hold until you complete the course of blood thinner medication.  It is important for you to complete the blood thinner medication as prescribed by your surgeon.  Continue your approved medications as instructed at time of discharge.  PRECAUTIONS If you experience chest pain or shortness of breath - call 911 immediately for transfer to the hospital emergency department.  If you develop a fever greater that 101 F, purulent  drainage from wound, increased redness or drainage from wound, foul odor from the wound/dressing, or calf pain - CONTACT YOUR SURGEON.                                                   FOLLOW-UP APPOINTMENTS Make sure you keep all of your appointments after your operation with your surgeon and caregivers. You should call the office at the above phone number and make an appointment for approximately two weeks after the date of your surgery or on the date instructed by your surgeon outlined in the "After Visit Summary".  RANGE OF MOTION AND STRENGTHENING EXERCISES  These exercises are designed to help you keep full movement of your hip joint. Follow your caregiver's or physical therapist's instructions. Perform all exercises about fifteen times, three times per day or as directed. Exercise both hips, even if you  have had only one joint replacement. These exercises can be done on a training (exercise) mat, on the floor, on a table or on a bed. Use whatever works the best and is most comfortable for you. Use music or television while you are exercising so that the exercises are a pleasant break in your day. This will make your life better with the exercises acting as a break in routine you can look forward to.  Lying on your back, slowly slide your foot toward your buttocks, raising your knee up off the floor. Then slowly slide your foot back down until your leg is straight again.  Lying on your back spread your legs as far apart as you can without causing discomfort.  Lying on your side, raise your upper leg and foot straight up from the floor as far as is comfortable. Slowly lower the leg and repeat.  Lying on your back, tighten up the muscle in the front of your thigh (quadriceps muscles). You can do this by keeping your leg straight and trying to raise your heel off the floor. This helps strengthen the largest muscle supporting your knee.  Lying on your back, tighten up the muscles of your buttocks both  with the legs straight and with the knee bent at a comfortable angle while keeping your heel on the floor.   IF YOU ARE TRANSFERRED TO A SKILLED REHAB FACILITY If the patient is transferred to a skilled rehab facility following release from the hospital, a list of the current medications will be sent to the facility for the patient to continue.  When discharged from the skilled rehab facility, please have the facility set up the patient's [place] prior to being released. Also, the skilled facility will be responsible for providing the patient with their medications at time of release from the facility to include their pain medication, the muscle relaxants, and their blood thinner medication. If the patient is still at the rehab facility at time of the two week follow up appointment, the skilled rehab facility will also need to assist the patient in arranging follow up appointment in our office and any transportation needs.  MAKE SURE YOU:  Understand these instructions.  Get help right away if you are not doing well or get worse.    Pick up stool softner and laxative for home use following surgery while on pain medications. Do not submerge incision under water. Please use good hand washing techniques while changing dressing each day. May shower starting three days after surgery. Please use a clean towel to pat the incision dry following showers. Continue to use ice for pain and swelling after surgery. Do not use any lotions or creams on the incision until instructed by your surgeon.  Information on my medicine - XARELTO (Rivaroxaban)  Why was Xarelto prescribed for you? Xarelto was prescribed for you to reduce the risk of blood clots forming after orthopedic surgery. The medical term for these abnormal blood clots is venous thromboembolism (VTE).  What do you need to know about xarelto ? Take your Xarelto ONCE DAILY at the same time every day. You may take it either  with or without food.  If you have difficulty swallowing the tablet whole, you may crush it and mix in applesauce just prior to taking your dose.  Take Xarelto exactly as prescribed by your doctor and DO NOT stop taking Xarelto without talking to the doctor who prescribed the medication.  Stopping without other VTE prevention  medication to take the place of Xarelto may increase your risk of developing a clot.  After discharge, you should have regular check-up appointments with your healthcare provider that is prescribing your Xarelto.    What do you do if you miss a dose? If you miss a dose, take it as soon as you remember on the same day then continue your regularly scheduled once daily regimen the next day. Do not take two doses of Xarelto on the same day.   Important Safety Information A possible side effect of Xarelto is bleeding. You should call your healthcare provider right away if you experience any of the following: ? Bleeding from an injury or your nose that does not stop. ? Unusual colored urine (red or dark brown) or unusual colored stools (red or black). ? Unusual bruising for unknown reasons. ? A serious fall or if you hit your head (even if there is no bleeding).  Some medicines may interact with Xarelto and might increase your risk of bleeding while on Xarelto. To help avoid this, consult your healthcare provider or pharmacist prior to using any new prescription or non-prescription medications, including herbals, vitamins, non-steroidal anti-inflammatory drugs (NSAIDs) and supplements.  This website has more information on Xarelto: [email or web address]

## 2019-09-18 NOTE — Op Note (Signed)
OPERATIVE REPORT- TOTAL HIP ARTHROPLASTY   PREOPERATIVE DIAGNOSIS: Osteoarthritis of the Right hip.   POSTOPERATIVE DIAGNOSIS: Osteoarthritis of the Right  hip.   PROCEDURE: Right total hip arthroplasty, anterior approach.   SURGEON: Gaynelle Arabian, MD   ASSISTANT: Griffith Citron, PA-C  ANESTHESIA:  Spinal  ESTIMATED BLOOD LOSS:-150 mL    DRAINS: Hemovac x1.   COMPLICATIONS: None   CONDITION: PACU - hemodynamically stable.   BRIEF CLINICAL NOTE: Jeffery Thomas is a 68 y.o. male who has advanced end-  stage arthritis of their Right  hip with progressively worsening pain and  dysfunction.The patient has failed nonoperative management and presents for  total hip arthroplasty.   PROCEDURE IN DETAIL: After successful administration of spinal  anesthetic, the traction boots for the [facility] bed were placed on both  feet and the patient was placed onto the [facility] bed, boots placed into the leg  holders. The Right hip was then isolated from the perineum with plastic  drapes and prepped and draped in the usual sterile fashion. ASIS and  greater trochanter were marked and a oblique incision was made, starting  at about 1 cm lateral and 2 cm distal to the ASIS and coursing towards  the anterior cortex of the femur. The skin was cut with a 10 blade  through subcutaneous tissue to the level of the fascia overlying the  tensor fascia lata muscle. The fascia was then incised in line with the  incision at the junction of the anterior third and posterior 2/3rd. The  muscle was teased off the fascia and then the interval between the TFL  and the rectus was developed. The Hohmann retractor was then placed at  the top of the femoral neck over the capsule. The vessels overlying the  capsule were cauterized and the fat on top of the capsule was removed.  A Hohmann retractor was then placed anterior underneath the rectus  femoris to give exposure to the entire anterior capsule. A T-shaped   capsulotomy was performed. The edges were tagged and the femoral head  was identified.       Osteophytes are removed off the superior acetabulum.  The femoral neck was then cut in situ with an oscillating saw. Traction  was then applied to the left lower extremity utilizing the [facility]  traction. The femoral head was then removed. Retractors were placed  around the acetabulum and then circumferential removal of the labrum was  performed. Osteophytes were also removed. Reaming starts at 49 mm to  medialize and  Increased in 2 mm increments to 51 mm. We reamed in  approximately 40 degrees of abduction, 20 degrees anteversion. A 52 mm  pinnacle acetabular shell was then impacted in anatomic position under  fluoroscopic guidance with excellent purchase. We did not need to place  any additional dome screws. A 32 mm neutral + 4 marathon liner was then  placed into the acetabular shell.       The femoral lift was then placed along the lateral aspect of the femur  just distal to the vastus ridge. The leg was  externally rotated and capsule  was stripped off the inferior aspect of the femoral neck down to the  level of the lesser trochanter, this was done with electrocautery. The femur was lifted after this was performed. The  leg was then placed in an extended and adducted position essentially delivering the femur. We also removed the capsule superiorly and the piriformis from the piriformis  fossa to gain excellent exposure of the  proximal femur. Rongeur was used to remove some cancellous bone to get  into the lateral portion of the proximal femur for placement of the  initial starter reamer. The starter broaches was placed  the starter broach  and was shown to go down the center of the canal. Broaching  with the Actis system was then performed starting at size 0  coursing  Up to size 7. A size 7 had excellent torsional and rotational  and axial stability. The trial standard offset neck was then  placed  with a 32 + 5 trial head. The hip was then reduced. We confirmed that  the stem was in the canal both on AP and lateral x-rays. It also has excellent sizing. The hip was reduced with outstanding stability through full extension and full external rotation.. AP pelvis was taken and the leg lengths were measured and found to be equal. Hip was then dislocated again and the femoral head and neck removed. The  femoral broach was removed. Size 7 Actis stem with a standard offset  neck was then impacted into the femur following native anteversion. Has  excellent purchase in the canal. Excellent torsional and rotational and  axial stability. It is confirmed to be in the canal on AP and lateral  fluoroscopic views. The 32 + 5 ceramic head was placed and the hip  reduced with outstanding stability. Again AP pelvis was taken and it  confirmed that the leg lengths were equal. The wound was then copiously  irrigated with saline solution and the capsule reattached and repaired  with Ethibond suture. 30 ml of .25% Bupivicaine was  injected into the capsule and into the edge of the tensor fascia lata as well as subcutaneous tissue. The fascia overlying the tensor fascia lata was then closed with a running #1 V-Loc. Subcu was closed with interrupted 2-0 Vicryl and subcuticular running 4-0 Monocryl. Incision was cleaned  and dried. Steri-Strips and a bulky sterile dressing applied. Hemovac  drain was hooked to suction and then the patient was awakened and transported to  recovery in stable condition.        Please note that a surgical assistant was a medical necessity for this procedure to perform it in a safe and expeditious manner. Assistant was necessary to provide appropriate retraction of vital neurovascular structures and to prevent femoral fracture and allow for anatomic placement of the prosthesis.  Gaynelle Arabian, M.D.

## 2019-09-19 ENCOUNTER — Encounter (HOSPITAL_COMMUNITY): Payer: Self-pay | Admitting: Orthopedic Surgery

## 2019-09-19 DIAGNOSIS — M1611 Unilateral primary osteoarthritis, right hip: Secondary | ICD-10-CM | POA: Diagnosis not present

## 2019-09-19 LAB — BASIC METABOLIC PANEL
Anion gap: 10 (ref 5–15)
BUN: 20 mg/dL (ref 8–23)
CO2: 21 mmol/L — ABNORMAL LOW (ref 22–32)
Calcium: 8 mg/dL — ABNORMAL LOW (ref 8.9–10.3)
Chloride: 100 mmol/L (ref 98–111)
Creatinine, Ser: 0.91 mg/dL (ref 0.61–1.24)
GFR calc Af Amer: >60 mL/min (ref >60)
GFR calc non Af Amer: >60 mL/min (ref >60)
Glucose, Bld: 193 mg/dL — ABNORMAL HIGH (ref 70–99)
Potassium: 4.3 mmol/L (ref 3.5–5.1)
Sodium: 131 mmol/L — ABNORMAL LOW (ref 135–145)

## 2019-09-19 LAB — CBC
HCT: 42.2 % (ref 39.0–52.0)
Hemoglobin: 13.3 g/dL (ref 13.0–17.0)
MCH: 28.1 pg (ref 26.0–34.0)
MCHC: 31.5 g/dL (ref 30.0–36.0)
MCV: 89.2 fL (ref 80.0–100.0)
Platelets: 219 10*3/uL (ref 150–400)
RBC: 4.73 MIL/uL (ref 4.22–5.81)
RDW: 12.8 % (ref 11.5–15.5)
WBC: 14.1 10*3/uL — ABNORMAL HIGH (ref 4.0–10.5)
nRBC: 0 % (ref 0.0–0.2)

## 2019-09-19 MED ORDER — TRAMADOL HCL 50 MG PO TABS: 50.0000 mg | ORAL_TABLET | Freq: Four times a day (QID) | ORAL | 0 refills | Status: DC | PRN

## 2019-09-19 MED ORDER — METHOCARBAMOL 500 MG PO TABS: 500.0000 mg | ORAL_TABLET | Freq: Four times a day (QID) | ORAL | 0 refills | Status: DC | PRN

## 2019-09-19 MED ORDER — HYDROCODONE-ACETAMINOPHEN 5-325 MG PO TABS: 1.0000 | ORAL_TABLET | Freq: Four times a day (QID) | ORAL | 0 refills | Status: DC | PRN

## 2019-09-19 MED ORDER — RIVAROXABAN 10 MG PO TABS: 10.0000 mg | ORAL_TABLET | Freq: Every day | ORAL | 0 refills | Status: DC

## 2019-09-19 NOTE — Care Management Obs Status (Signed)
[facility] NOTIFICATION   Patient Details  Name: Jeffery Thomas MRN: [id number] Date of Birth: 07-21-1951   Medicare Observation Status Notification Given:  Yes    Lia Hopping, LCSW 09/19/2019, 9:45 AM

## 2019-09-19 NOTE — Progress Notes (Signed)
Subjective: 1 Day Post-Op Procedure(s) (LRB): TOTAL HIP ARTHROPLASTY ANTERIOR APPROACH (Right) Patient reports pain as mild.   Patient seen in rounds with Dr. Wynelle Link. Patient is well, and has had no acute complaints or problems. No SOB or chest pain. Walker quite a bit with therapy yesterday. Voiding well. Positive flatus.  Plan is to go Home after hospital stay.  Objective: Vital signs in last 24 hours: Temp:  [97.3 F (36.3 C)-98.6 F (37 C)] 97.3 F (36.3 C) (11/12 0527) Pulse Rate:  [51-78] 62 (11/12 0527) Resp:  [14-19] 16 (11/12 0527) BP: (94-170)/(62-99) 147/99 (11/12 0527) SpO2:  [93 %-100 %] 95 % (11/12 0527) Weight:  [94.4 kg] 94.4 kg (11/11 1236)  Intake/Output from previous day:  Intake/Output Summary (Last 24 hours) at 09/19/2019 0744 Last data filed at 09/19/2019 0532 Gross per 24 hour  Intake 3058.89 ml  Output 1720 ml  Net 1338.89 ml     Labs: Recent Labs    09/19/19 0221  HGB 13.3   Recent Labs    09/19/19 0221  WBC 14.1*  RBC 4.73  HCT 42.2  PLT 219   Recent Labs    09/19/19 0221  NA 131*  K 4.3  CL 100  CO2 21*  BUN 20  CREATININE 0.91  GLUCOSE 193*  CALCIUM 8.0*    EXAM General - Patient is Alert and Oriented Extremity - Neurologically intact Intact pulses distally Dorsiflexion/Plantar flexion intact No cellulitis present Compartment soft Dressing - moderate drainage from the hemovac site. No drainage from the incision. Motor Function - intact, moving foot and toes well on exam.  Hemovac pulled without difficulty.  Past Medical History:  Diagnosis Date  . AICD (automatic cardioverter/defibrillator) present   . Chronic systolic CHF (congestive heart failure) ([place])   . Gastroesophageal reflux disease   . History of alcohol abuse    quit 08/ 20/ 2018  . History of cardiac arrest   . History of encephalopathy 07/05/2017   anoxic-ischemic   . History of ST elevation myocardial infarction (STEMI) 06/26/2017   sudden  cardiac arrest /  per cardiac cath 06-26-2017 normal coronaries, ef <20% , severe LVSD; dx takatsuki syndrome  . History of sudden cardiac arrest 06/26/2017   VF arrest / STEMI   . Hypertension   . 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 Inguinal hernia    left  . Left hydrocele   . Left inguinal hernia   . Left inguinal hernia 05/07/2019  . Myocardial infarction ([place])   . NICM (nonischemic cardiomyopathy) ([place])    06-26-2017  per cardiac cath , ef <20% (echo 35-40%)  ;  last echo 10-09-2014 ef 60-65%  . OSA (obstructive sleep apnea)    03-26-2019 per pt had sleep study after heart attack, was told no recommendation , but did stop smoking and alcohol  . Pre-diabetes    denies  . 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   Weight as of this encounter: 94.4 kg. Advance diet Up with therapy D/C IV fluids when tolerating POs well  DVT Prophylaxis -  Xarelto Weight Bearing As Tolerated  Hemovac Pulled  Will continue therapy for another session or two with the plan to DC home today if he is meeting his goals. Follow up in 2 weeks. Discharge instructions discussed.   Ardeen Jourdain, PA-C Orthopaedic Surgery 09/19/2019, 7:44 AM

## 2019-09-19 NOTE — Care Management CC44 (Signed)
Condition Code 44 Documentation Completed  Patient Details  Name: Jeffery Thomas MRN: [id number] Date of Birth: 1951/10/02   Condition Code 44 given:  Yes Patient signature on Condition Code 44 notice:  Yes Documentation of 2 MD's agreement:  Yes Code 44 added to claim:  Yes    Lia Hopping, LCSW 09/19/2019, 9:45 AM

## 2019-09-19 NOTE — Progress Notes (Signed)
Physical Therapy Treatment Patient Details Name: Jeffery Thomas MRN: [id number] DOB: 06-15-1951 Today's Date: 09/19/2019    History of Present Illness Patient is 68 y.o. 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    PT Comments    POD # 1 pm session with spouse Had spouse "hands on" assist pt with all mobility.  General transfer comment: cues for safe hand placement and technique wtih RW for power up.  General Gait Details: <25% VC's on proper walker to self duistanve and safety with turns.  General stair comments: with spouse present for "hands on"instruction on safe handling.  Performed standing TE's following HEP hnadout.  Instructed on proper tech, frq as well as use of ICE.  Addressed all mobility questions, discussed appropriate activity, educated on use of ICE.  Pt ready for D/C to home.   Follow Up Recommendations  Follow surgeon's recommendation for DC plan and follow-up therapies     Equipment Recommendations  None recommended by PT    Recommendations for Other Services       Precautions / Restrictions Precautions Precautions: Fall Restrictions Weight Bearing Restrictions: No    Mobility  Bed Mobility Overal bed mobility: Needs Assistance Bed Mobility: Supine to Sit     Supine to sit: Supervision;HOB elevated     General bed mobility comments: OOB in recliner  Transfers Overall transfer level: Needs assistance Equipment used: Rolling walker (2 wheeled) Transfers: Sit to/from Stand Sit to Stand: Supervision         General transfer comment: cues for safe hand placement and technique wtih RW for power up  Ambulation/Gait   Gait Distance (Feet): 85 Feet Assistive device: Rolling walker (2 wheeled) Gait Pattern/deviations: Step-through pattern Gait velocity: WNL's   General Gait Details: <25% VC's on proper walker to self duistanve and safety with  turns   Stairs Stairs: Yes Stairs assistance: Supervision;Min guard Stair Management: Step to pattern;With walker Number of Stairs: 3 General stair comments: with spouse present for "hands on"instruction on safe handling   Wheelchair Mobility    Modified Rankin (Stroke Patients Only)       Balance                                            Cognition Arousal/Alertness: Awake/alert Behavior During Therapy: WFL for tasks assessed/performed Overall Cognitive Status: Within Functional Limits for tasks assessed                                 General Comments: motivated      Exercises 5 reps all standing TE's following HEP  General Comments        Pertinent Vitals/Pain Pain Assessment: 0-10 Pain Score: 3  Pain Location: R hip Pain Descriptors / Indicators: Sore;Discomfort Pain Intervention(s): Monitored during session;Premedicated before session;Repositioned;Ice applied    Home Living                      Prior Function            PT Goals (current goals can now be found in the care plan section) Progress towards PT goals: Progressing toward goals    Frequency    7X/week  PT Plan      Co-evaluation              AM-PAC PT "6 Clicks" Mobility   Outcome Measure  Help needed turning from your back to your side while in a flat bed without using bedrails?: A Little Help needed moving from lying on your back to sitting on the side of a flat bed without using bedrails?: A Little Help needed moving to and from a bed to a chair (including a wheelchair)?: A Little Help needed standing up from a chair using your arms (e.g., wheelchair or bedside chair)?: A Little Help needed to walk in hospital room?: A Little Help needed climbing 3-5 steps with a railing? : A Little 6 Click Score: 18    End of Session Equipment Utilized During Treatment: Gait belt Activity Tolerance: Patient tolerated treatment  well Patient left: in chair;with call bell/phone within reach;with chair alarm set Nurse Communication: Mobility status PT Visit Diagnosis: Muscle weakness (generalized) (M62.81);Difficulty in walking, not elsewhere classified (R26.2)     Time: 1115-1130 PT Time Calculation (min) (ACUTE ONLY): 15 min  Charges:  $Gait Training: 8-22 mins $Therapeutic Exercise: 8-22 mins                     Rica Koyanagi  PTA Acute  Rehabilitation Services Pager      [phone] Office      [phone]

## 2019-09-19 NOTE — Progress Notes (Signed)
Physical Therapy Treatment Patient Details Name: Jeffery Thomas MRN: [id number] DOB: [birth year] Today's Date: 09/19/2019    History of Present Illness Patient is 68 y.o. 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    PT Comments    POD # 1 am session Assisted OOB.  General bed mobility comments: demonstarted and instructed how to use gait belt to self assist.  General transfer comment: cues for safe hand placement and technique wtih RW for power up.  General Gait Details: <25% VC's on proper walker to self duistanve and safety with turns.  Then returned to room to perform some TE's following HEP handout.  Instructed on proper tech, freq as well as use of ICE.   Pt will need another PT session with spouse to address stairs.   Follow Up Recommendations  Follow surgeon's recommendation for DC plan and follow-up therapies     Equipment Recommendations  None recommended by PT    Recommendations for Other Services       Precautions / Restrictions Precautions Precautions: Fall Restrictions Weight Bearing Restrictions: No    Mobility  Bed Mobility Overal bed mobility: Needs Assistance Bed Mobility: Supine to Sit     Supine to sit: Supervision;HOB elevated     General bed mobility comments: demonstarted and instructed how to use gait belt to self assist  Transfers Overall transfer level: Needs assistance Equipment used: Rolling walker (2 wheeled) Transfers: Sit to/from Stand Sit to Stand: Supervision         General transfer comment: cues for safe hand placement and technique wtih RW for power up  Ambulation/Gait   Gait Distance (Feet): 110 Feet Assistive device: Rolling walker (2 wheeled) Gait Pattern/deviations: Step-through pattern Gait velocity: WNL's   General Gait Details: <25% VC's on proper walker to self duistanve and safety with turns   Location manager    Modified Rankin (Stroke Patients Only)       Balance                                            Cognition Arousal/Alertness: Awake/alert Behavior During Therapy: WFL for tasks assessed/performed Overall Cognitive Status: Within Functional Limits for tasks assessed                                 General Comments: motivated      Exercises Total Joint Exercises Ankle Circles/Pumps: AROM;Seated;Both;10 reps Gluteal Sets: AROM;Both;10 reps Short Arc Quad: AROM;Right;5 reps Heel Slides: 5 reps;Right;AROM Hip ABduction/ADduction: AROM;Right;5 reps Long Arc Quad: AROM;Seated;Right;5 reps    General Comments        Pertinent Vitals/Pain Pain Assessment: 0-10 Pain Score: 3  Pain Location: R hip Pain Descriptors / Indicators: Sore;Discomfort Pain Intervention(s): Monitored during session;Premedicated before session;Repositioned;Ice applied    Home Living                      Prior Function            PT Goals (current goals can now be found in the care plan section) Progress towards PT goals: Progressing toward goals    Frequency  7X/week      PT Plan      Co-evaluation              AM-PAC PT "6 Clicks" Mobility   Outcome Measure  Help needed turning from your back to your side while in a flat bed without using bedrails?: A Little Help needed moving from lying on your back to sitting on the side of a flat bed without using bedrails?: A Little Help needed moving to and from a bed to a chair (including a wheelchair)?: A Little Help needed standing up from a chair using your arms (e.g., wheelchair or bedside chair)?: A Little Help needed to walk in hospital room?: A Little Help needed climbing 3-5 steps with a railing? : A Little 6 Click Score: 18    End of Session Equipment Utilized During Treatment: Gait belt Activity Tolerance: Patient tolerated treatment  well Patient left: in chair;with call bell/phone within reach;with chair alarm set Nurse Communication: Mobility status PT Visit Diagnosis: Muscle weakness (generalized) (M62.81);Difficulty in walking, not elsewhere classified (R26.2)     Time: [id number] PT Time Calculation (min) (ACUTE ONLY): 30 min  Charges:  $Gait Training: 8-22 mins $Therapeutic Exercise: 8-22 mins                     Rica Koyanagi  PTA Acute  Rehabilitation Services Pager      [phone] Office      [phone]

## 2019-09-20 NOTE — Discharge Summary (Signed)
Physician Discharge Summary   Patient ID: Jeffery Thomas MRN: [id number] DOB/AGE: 01-13-1951 68 y.o.  Admit date: 09/18/2019 Discharge date: 09/19/2019  Primary Diagnosis: Primary osteoarthritis right hip   Admission Diagnoses:  Past Medical History:  Diagnosis Date   AICD (automatic cardioverter/defibrillator) present    Chronic systolic CHF (congestive heart failure) (HCC)    Gastroesophageal reflux disease    History of alcohol abuse    quit 08/ 20/ 2018   History of cardiac arrest    History of encephalopathy 07/05/2017   anoxic-ischemic    History of ST elevation myocardial infarction (STEMI) 06/26/2017   sudden cardiac arrest /  per cardiac cath 06-26-2017 normal coronaries, ef <20% , severe LVSD; dx takatsuki syndrome   History of sudden cardiac arrest 06/26/2017   VF arrest / STEMI    Hypertension    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   premature generator change 07-22-2018--  followed by dr allred  ([facility])   Inguinal hernia    left   Left hydrocele    Left inguinal hernia    Left inguinal hernia 05/07/2019   Myocardial infarction [facility])    NICM (nonischemic cardiomyopathy) ([facility])    06-26-2017  per cardiac cath , ef <20% (echo 35-40%)  ;  last echo 10-09-2014 ef 60-65%   OSA (obstructive sleep apnea)    03-26-2019 per pt had sleep study after heart attack, was told no recommendation , but did stop smoking and alcohol   Pre-diabetes    denies   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   Weight as of this encounter: 94.4 kg.  Procedure(s) (LRB): TOTAL HIP ARTHROPLASTY ANTERIOR APPROACH (Right)   Consults: None  HPI: Jeffery Thomas, 68 y.o. male, has  a history of pain and functional disability in the right hip(s) due to arthritis and patient has failed non-surgical conservative treatments for greater than 12 weeks to include NSAID's and/or analgesics and activity modification.  Onset of symptoms was gradual starting 2 years ago with gradually worsening course since that time.The patient noted no past surgery on the right hip(s).  Patient currently rates pain in the right hip at 7 out of 10 with activity. Patient has worsening of pain with activity and weight bearing and pain that interfers with activities of daily living. Patient has evidence of severe bone-on-bone arthritis with subchondral cystic formation and large marginal osteophyte formation. by imaging studies. This condition presents safety issues increasing the risk of falls.  There is no current active infection.  Laboratory Data: Admission on 09/18/2019, Discharged on 09/19/2019  Component Date Value Ref Range Status   WBC 09/19/2019 14.1* 4.0 - 10.5 K/uL Final   RBC 09/19/2019 4.73  4.22 - 5.81 MIL/uL Final   Hemoglobin 09/19/2019 13.3  13.0 - 17.0 g/dL Final   HCT 09/19/2019 42.2  39.0 - 52.0 % Final   MCV 09/19/2019 89.2  80.0 - 100.0 fL Final   MCH 09/19/2019 28.1  26.0 - 34.0 pg Final   MCHC 09/19/2019 31.5  30.0 - 36.0 g/dL Final   RDW 09/19/2019 12.8  11.5 - 15.5 % Final   Platelets 09/19/2019 219  150 - 400 K/uL Final   nRBC 09/19/2019 0.0  0.0 - 0.2 % Final   Performed at [facility], [place] [address], [place], [place] [address]   Sodium 09/19/2019 131* 135 - 145 mmol/L Final   Potassium 09/19/2019 4.3  3.5 - 5.1 mmol/L Final   Chloride 09/19/2019 100  98 - 111 mmol/L Final   CO2 09/19/2019 21* 22 - 32 mmol/L Final   Glucose, Bld 09/19/2019 193* 70 - 99 mg/dL Final   BUN 09/19/2019 20  8 - 23 mg/dL Final   Creatinine, Ser 09/19/2019 0.91  0.61 - 1.24 mg/dL Final   Calcium 09/19/2019 8.0* 8.9 - 10.3 mg/dL Final   GFR calc non Af  Amer 09/19/2019 >60  >60 mL/min Final   GFR calc Af Amer 09/19/2019 >60  >60 mL/min Final   Anion gap 09/19/2019 10  5 - 15 Final   Performed at [facility], [place] [address] [place], [place] [address]  Hospital Outpatient Visit on 09/14/2019  Component Date Value Ref Range Status   SARS-CoV-2, NAA 09/14/2019 NOT DETECTED  NOT DETECTED Final   Comment: (NOTE) This nucleic acid amplification test was developed and its performance characteristics determined by [employer]. Nucleic acid amplification tests include PCR and TMA. This test has not been FDA cleared or approved. This test has been authorized by FDA under an Emergency Use Authorization (EUA). This test is only authorized for the duration of time the declaration that circumstances exist justifying the authorization of the emergency use of in vitro diagnostic tests for detection of SARS-CoV-2 virus and/or diagnosis of COVID-19 infection under section 564(b)(1) of the Act, 21 U.S.C. [id number]) (1), unless the authorization is terminated or revoked sooner. When diagnostic testing is negative, the possibility of a false negative result should be considered in the context of a patient's recent exposures and the presence of clinical signs and symptoms consistent with COVID-19. An individual without symptoms of COVID- 19 and who is not shedding SARS-CoV-2 vi                          rus would expect to have a negative (not detected) result in this assay. Performed At: [facility] [place], [place] [id number] Rush Farmer MD [id number]    Coronavirus Source 09/14/2019 NASOPHARYNGEAL   Final   Performed at [place] [address], [place], [place] [address]  Hospital Outpatient Visit on 09/12/2019  Component Date Value Ref Range Status   aPTT 09/12/2019 30  24 - 36 seconds Final   Performed at [facility], [place] [address] [place], [place]  [address]   WBC 09/12/2019 6.2  4.0 - 10.5 K/uL Final   RBC 09/12/2019 5.63  4.22 - 5.81 MIL/uL Final   Hemoglobin 09/12/2019 15.8  13.0 - 17.0 g/dL Final   HCT 09/12/2019 50.2  39.0 - 52.0 % Final   MCV 09/12/2019 89.2  80.0 - 100.0 fL Final   MCH 09/12/2019 28.1  26.0 - 34.0 pg Final   MCHC 09/12/2019 31.5  30.0 - 36.0 g/dL Final   RDW 09/12/2019 13.2  11.5 - 15.5 % Final   Platelets 09/12/2019 247  150 - 400 K/uL Final   nRBC 09/12/2019 0.0  0.0 - 0.2 % Final   Performed at [facility], [place] [address] [place], [place] [address]   Sodium 09/12/2019 139  135 - 145 mmol/L Final   Potassium 09/12/2019 4.8  3.5 - 5.1 mmol/L Final  Chloride 09/12/2019 104  98 - 111 mmol/L Final   CO2 09/12/2019 26  22 - 32 mmol/L Final   Glucose, Bld 09/12/2019 100* 70 - 99 mg/dL Final   BUN 09/12/2019 21  8 - 23 mg/dL Final   Creatinine, Ser 09/12/2019 0.92  0.61 - 1.24 mg/dL Final   Calcium 09/12/2019 9.2  8.9 - 10.3 mg/dL Final   Total Protein 09/12/2019 7.4  6.5 - 8.1 g/dL Final   Albumin 09/12/2019 4.6  3.5 - 5.0 g/dL Final   AST 09/12/2019 19  15 - 41 U/L Final   ALT 09/12/2019 28  0 - 44 U/L Final   Alkaline Phosphatase 09/12/2019 69  38 - 126 U/L Final   Total Bilirubin 09/12/2019 1.0  0.3 - 1.2 mg/dL Final   GFR calc non Af Amer 09/12/2019 >60  >60 mL/min Final   GFR calc Af Amer 09/12/2019 >60  >60 mL/min Final   Anion gap 09/12/2019 9  5 - 15 Final   Performed at [facility], [place] [address] [place], [place] [address]   Prothrombin Time 09/12/2019 12.6  11.4 - 15.2 seconds Final   INR 09/12/2019 1.0  0.8 - 1.2 Final   Comment: (NOTE) INR goal varies based on device and disease states. Performed at [facility], [place] [address] [place], [place] [address]    ABO/RH(D) 09/12/2019 O POS   Final   Antibody Screen 09/12/2019 NEG   Final   Sample Expiration 09/12/2019 09/21/2019,2359   Final   Extend  sample reason 09/12/2019    Final                   Value:NO TRANSFUSIONS OR PREGNANCY IN THE PAST 3 MONTHS Performed at [place] [address], [place], [place] [address]    MRSA, PCR 09/12/2019 NEGATIVE  NEGATIVE Final   Staphylococcus aureus 09/12/2019 POSITIVE* NEGATIVE Final   Comment: (NOTE) The Xpert SA Assay (FDA approved for NASAL specimens in patients 20 years of age and older), is one component of a comprehensive surveillance program. It is not intended to diagnose infection nor to guide or monitor treatment. Performed at [facility], [place] [address] [place], [place] [address]    Hgb A1c MFr Bld 09/12/2019 6.1* 4.8 - 5.6 % Final   Comment: (NOTE) Pre diabetes:          5.7%-6.4% Diabetes:              >6.4% Glycemic control for   <7.0% adults with diabetes    Mean Plasma Glucose 09/12/2019 128.37  mg/dL Final   Performed at [facility], [place] [address] [place], [place] [address]     X-Rays:Dg Pelvis Portable  Result Date: 09/18/2019 CLINICAL DATA:  Right total hip arthroplasty EXAM: PORTABLE PELVIS 1-2 VIEWS COMPARISON:  05/27/2019 right hip radiograph FINDINGS: Status post right total hip arthroplasty with well-positioned right acetabular and proximal right femoral prostheses. No evidence of hip dislocation on this single frontal view. Surgical drain terminates just lateral to the right hip. Expected soft tissue gas surrounding the right hip joint. No osseous fracture. No suspicious focal osseous lesions. Stable nonspecific sclerosis overlying the left femoral head. IMPRESSION: 1. Satisfactory immediate postoperative single frontal radiograph appearance status post right total hip arthroplasty. 2. Chronic nonspecific sclerosis overlying the left femoral head, cannot exclude avascular necrosis versus degenerative change. Electronically Signed   By: Ilona Sorrel M.D.   On: 09/18/2019 12:23  Dg C-arm 1-60 Min-no  Report  Result Date: 09/18/2019 Fluoroscopy was utilized by the requesting physician.  No radiographic interpretation.   Dg Hip Operative Unilat W Or W/o Pelvis Right  Result Date: 09/18/2019 CLINICAL DATA:  Order for total right hip replacement Total fluoro time 5 secs EXAM: OPERATIVE RIGHT HIP (WITH PELVIS IF PERFORMED) 3 VIEWS TECHNIQUE: Fluoroscopic spot image(s) were submitted for interpretation post-operatively. COMPARISON:  Radiograph 05/27/2019 FINDINGS: Interval unipolar RIGHT hip arthroplasty. No fracture dislocation. IMPRESSION: No complication following RIGHT hip arthroplasty.  Is Electronically Signed   By: Suzy Bouchard M.D.   On: 09/18/2019 11:08    EKG: Orders placed or performed in visit on 09/03/19   EKG 12-Lead     Hospital Course: Patient was admitted to [facility] and taken to the OR and underwent the above state procedure without complications.  Patient tolerated the procedure well and was later transferred to the recovery room and then to the orthopaedic floor for postoperative care.  They were given PO and IV analgesics for pain control following their surgery.  They were given 24 hours of postoperative antibiotics of  Anti-infectives (From admission, onward)   Start     Dose/Rate Route Frequency Ordered Stop   09/18/19 1600  ceFAZolin (ANCEF) IVPB 2g/100 mL premix     2 g 200 mL/hr over 30 Minutes Intravenous Every 6 hours 09/18/19 1235 09/18/19 2132   09/18/19 0745  ceFAZolin (ANCEF) IVPB 2g/100 mL premix     2 g 200 mL/hr over 30 Minutes Intravenous On call to O.R. 09/18/19 0739 09/18/19 1024     and started on DVT prophylaxis in the form of Aspirin.   PT and OT were ordered for total hip protocol.  The patient was allowed to be WBAT with therapy. Discharge planning was consulted to help with postop disposition and equipment needs.  Patient had a good night on the evening of surgery.  They started to get up OOB with therapy on day one.  Hemovac drain  was pulled without difficulty.  The patient had progressed with therapy and meeting their goals.  Incision was healing well.  Patient was seen in rounds and was ready to go home.  Diet: Cardiac diet Activity:WBAT Follow-up:in 2 weeks Disposition - Home Discharged Condition: stable   Discharge Instructions    Call MD / Call 911   Complete by: As directed    If you experience chest pain or shortness of breath, CALL 911 and be transported to the hospital emergency room.  If you develope a fever above 101 F, pus (white drainage) or increased drainage or redness at the wound, or calf pain, call your surgeon's office.   Constipation Prevention   Complete by: As directed    Drink plenty of fluids.  Prune juice may be helpful.  You may use a stool softener, such as Colace (over the counter) 100 mg twice a day.  Use MiraLax (over the counter) for constipation as needed.   Diet - low sodium heart healthy   Complete by: As directed    Discharge instructions   Complete by: As directed    Dr. Gaynelle Arabian Total Joint Specialist [facility] [address], [place], [place] [address] [phone]  ANTERIOR APPROACH TOTAL HIP REPLACEMENT POSTOPERATIVE DIRECTIONS   Hip Rehabilitation, Guidelines Following Surgery  The results of a hip operation are greatly improved after range of motion and muscle strengthening exercises. Follow all safety measures which are given to protect your hip.  If any of these exercises cause increased pain or swelling in your joint, decrease the amount until you are comfortable again. Then slowly increase the exercises. Call your caregiver if you have problems or questions.   HOME CARE INSTRUCTIONS  Remove items at home which could result in a fall. This includes throw rugs or furniture in walking pathways.  ICE to the affected hip every three hours for 30 minutes at a time and then as needed for pain and swelling.  Continue to use ice on the hip for pain and  swelling from surgery. You may notice swelling that will progress down to the foot and ankle.  This is normal after surgery.  Elevate the leg when you are not up walking on it.   Continue to use the breathing machine which will help keep your temperature down.  It is common for your temperature to cycle up and down following surgery, especially at night when you are not up moving around and exerting yourself.  The breathing machine keeps your lungs expanded and your temperature down.   DIET You may resume your previous home diet once your are discharged from the hospital.  DRESSING / WOUND CARE / SHOWERING You may shower 3 days after surgery, but keep the wounds dry during showering.  You may use an occlusive plastic wrap (Press'n Seal for example), NO SOAKING/SUBMERGING IN THE BATHTUB.  If the bandage gets wet, change with a clean dry gauze.  If the incision gets wet, pat the wound dry with a clean towel. You may start showering once you are discharged home but do not submerge the incision under water. Just pat the incision dry and apply a dry gauze dressing on daily. Change the surgical dressing daily and reapply a dry dressing each time.  ACTIVITY Walk with your walker as instructed. Use walker as long as suggested by your caregivers. Avoid periods of inactivity such as sitting longer than an hour when not asleep. This helps prevent blood clots.  You may resume a sexual relationship in one month or when given the OK by your doctor.  You may return to work once you are cleared by your doctor.  Do not drive a car for 6 weeks or until released by you surgeon.  Do not drive while taking narcotics.  WEIGHT BEARING Weight bearing as tolerated with assist device (walker, cane, etc) as directed, use it as long as suggested by your surgeon or therapist, typically at least 4-6 weeks.  POSTOPERATIVE CONSTIPATION PROTOCOL Constipation - defined medically as fewer than three stools per week and severe  constipation as less than one stool per week.  One of the most common issues patients have following surgery is constipation.  Even if you have a regular bowel pattern at home, your normal regimen is likely to be disrupted due to multiple reasons following surgery.  Combination of anesthesia, postoperative narcotics, change in appetite and fluid intake all can affect your bowels.  In order to avoid complications following surgery, here are some recommendations in order to help you during your recovery period.  Colace (docusate) - Pick up an over-the-counter form of Colace or another stool softener and take twice a day as long as you are requiring postoperative pain medications.  Take with a full glass of water daily.  If you experience loose stools or diarrhea, hold the colace until you stool forms back up.  If your symptoms do not get better within 1 week or if they get worse, check  with your doctor.  Dulcolax (bisacodyl) - Pick up over-the-counter and take as directed by the product packaging as needed to assist with the movement of your bowels.  Take with a full glass of water.  Use this product as needed if not relieved by Colace only.   MiraLax (polyethylene glycol) - Pick up over-the-counter to have on hand.  MiraLax is a solution that will increase the amount of water in your bowels to assist with bowel movements.  Take as directed and can mix with a glass of water, juice, soda, coffee, or tea.  Take if you go more than two days without a movement. Do not use MiraLax more than once per day. Call your doctor if you are still constipated or irregular after using this medication for 7 days in a row.  If you continue to have problems with postoperative constipation, please contact the office for further assistance and recommendations.  If you experience "the worst abdominal pain ever" or develop nausea or vomiting, please contact the office immediatly for further recommendations for  treatment.  ITCHING  If you experience itching with your medications, try taking only a single pain pill, or even half a pain pill at a time.  You can also use Benadryl over the counter for itching or also to help with sleep.   TED HOSE STOCKINGS Wear the elastic stockings on both legs for three weeks following surgery during the day but you may remove then at night for sleeping.  MEDICATIONS See your medication summary on the "After Visit Summary" that the nursing staff will review with you prior to discharge.  You may have some home medications which will be placed on hold until you complete the course of blood thinner medication.  It is important for you to complete the blood thinner medication as prescribed by your surgeon.  Continue your approved medications as instructed at time of discharge.  PRECAUTIONS If you experience chest pain or shortness of breath - call 911 immediately for transfer to the hospital emergency department.  If you develop a fever greater that 101 F, purulent drainage from wound, increased redness or drainage from wound, foul odor from the wound/dressing, or calf pain - CONTACT YOUR SURGEON.                                                   FOLLOW-UP APPOINTMENTS Make sure you keep all of your appointments after your operation with your surgeon and caregivers. You should call the office at the above phone number and make an appointment for approximately two weeks after the date of your surgery or on the date instructed by your surgeon outlined in the "After Visit Summary".  RANGE OF MOTION AND STRENGTHENING EXERCISES  These exercises are designed to help you keep full movement of your hip joint. Follow your caregiver's or physical therapist's instructions. Perform all exercises about fifteen times, three times per day or as directed. Exercise both hips, even if you have had only one joint replacement. These exercises can be done on a training (exercise) mat, on the  floor, on a table or on a bed. Use whatever works the best and is most comfortable for you. Use music or television while you are exercising so that the exercises are a pleasant break in your day. This will make your life better with the  exercises acting as a break in routine you can look forward to.  Lying on your back, slowly slide your foot toward your buttocks, raising your knee up off the floor. Then slowly slide your foot back down until your leg is straight again.  Lying on your back spread your legs as far apart as you can without causing discomfort.  Lying on your side, raise your upper leg and foot straight up from the floor as far as is comfortable. Slowly lower the leg and repeat.  Lying on your back, tighten up the muscle in the front of your thigh (quadriceps muscles). You can do this by keeping your leg straight and trying to raise your heel off the floor. This helps strengthen the largest muscle supporting your knee.  Lying on your back, tighten up the muscles of your buttocks both with the legs straight and with the knee bent at a comfortable angle while keeping your heel on the floor.   IF YOU ARE TRANSFERRED TO A SKILLED REHAB FACILITY If the patient is transferred to a skilled rehab facility following release from the hospital, a list of the current medications will be sent to the facility for the patient to continue.  When discharged from the skilled rehab facility, please have the facility set up the patient's [place] prior to being released. Also, the skilled facility will be responsible for providing the patient with their medications at time of release from the facility to include their pain medication, the muscle relaxants, and their blood thinner medication. If the patient is still at the rehab facility at time of the two week follow up appointment, the skilled rehab facility will also need to assist the patient in arranging follow up appointment in our office  and any transportation needs.  MAKE SURE YOU:  Understand these instructions.  Get help right away if you are not doing well or get worse.    Pick up stool softner and laxative for home use following surgery while on pain medications. Do not submerge incision under water. Please use good hand washing techniques while changing dressing each day. May shower starting three days after surgery. Please use a clean towel to pat the incision dry following showers. Continue to use ice for pain and swelling after surgery. Do not use any lotions or creams on the incision until instructed by your surgeon.   Increase activity slowly as tolerated   Complete by: As directed      Allergies as of 09/19/2019   No Known Allergies     Medication List    STOP taking these medications   ibuprofen 200 MG tablet Commonly known as: ADVIL   meloxicam 15 MG tablet Commonly known as: MOBIC     TAKE these medications   atorvastatin 20 MG tablet Commonly known as: LIPITOR TAKE 1 TABLET DAILY   carvedilol 25 MG tablet Commonly known as: COREG Take 0.5 tablets (12.5 mg total) by mouth 2 (two) times daily.   HYDROcodone-acetaminophen 5-325 MG tablet Commonly known as: NORCO/VICODIN Take 1-2 tablets by mouth every 6 (six) hours as needed for severe pain.   losartan 100 MG tablet Commonly known as: COZAAR TAKE 1 TABLET DAILY   methocarbamol 500 MG tablet Commonly known as: ROBAXIN Take 1 tablet (500 mg total) by mouth every 6 (six) hours as needed for muscle spasms.   montelukast 10 MG tablet Commonly known as: SINGULAIR Take 1 tablet (10 mg total) by mouth daily.   omeprazole 40  MG capsule Commonly known as: PRILOSEC Take 1 capsule (40 mg total) by mouth daily.   rivaroxaban 10 MG Tabs tablet Commonly known as: XARELTO Take 1 tablet (10 mg total) by mouth daily with breakfast.   spironolactone 25 MG tablet Commonly known as: ALDACTONE TAKE 1 TABLET DAILY   traMADol 50 MG  tablet Commonly known as: ULTRAM Take 1-2 tablets (50-100 mg total) by mouth every 6 (six) hours as needed for moderate pain.      Follow-up Information    Gaynelle Arabian, MD. Schedule an appointment as soon as possible for a visit on 10/01/2019.   Specialty: Orthopedic Surgery Contact information: [address] [place] [address] [id number]           Signed: Ardeen Jourdain, PA-C Orthopaedic Surgery 09/20/2019, 9:05 AM

## 2019-09-30 NOTE — Telephone Encounter (Signed)
Patient had surgery  This encounter was created in error - please disregard. This encounter was created in error - please disregard. This encounter was created in error - please disregard.

## 2019-10-07 ENCOUNTER — Ambulatory Visit (INDEPENDENT_AMBULATORY_CARE_PROVIDER_SITE_OTHER): Payer: Medicare Other | Admitting: *Deleted

## 2019-10-07 DIAGNOSIS — Z8674 Personal history of sudden cardiac arrest: Secondary | ICD-10-CM | POA: Diagnosis not present

## 2019-10-08 ENCOUNTER — Encounter: Payer: Self-pay | Admitting: Pharmacist Clinician (PhC)/ Clinical Pharmacy Specialist

## 2019-10-08 ENCOUNTER — Ambulatory Visit (INDEPENDENT_AMBULATORY_CARE_PROVIDER_SITE_OTHER): Payer: Medicare Other

## 2019-10-08 ENCOUNTER — Ambulatory Visit (INDEPENDENT_AMBULATORY_CARE_PROVIDER_SITE_OTHER): Payer: Medicare Other | Admitting: Pharmacist Clinician (PhC)/ Clinical Pharmacy Specialist

## 2019-10-08 ENCOUNTER — Other Ambulatory Visit: Payer: Self-pay

## 2019-10-08 VITALS — BP 150/96 | HR 56 | Ht 72.0 in | Wt 209.8 lb

## 2019-10-08 DIAGNOSIS — I428 Other cardiomyopathies: Secondary | ICD-10-CM | POA: Diagnosis not present

## 2019-10-08 DIAGNOSIS — I10 Essential (primary) hypertension: Secondary | ICD-10-CM | POA: Diagnosis not present

## 2019-10-08 DIAGNOSIS — Z4502 Encounter for adjustment and management of automatic implantable cardiac defibrillator: Secondary | ICD-10-CM | POA: Diagnosis not present

## 2019-10-08 LAB — CUP PACEART REMOTE DEVICE CHECK
Battery Remaining Longevity: 94 mo
Battery Remaining Percentage: 89 %
Battery Voltage: 3.07 V
Brady Statistic RV Percent Paced: 1 %
Date Time Interrogation Session: 20201201055309
HighPow Impedance: 69 Ohm
HighPow Impedance: 69 Ohm
Implantable Lead Implant Date: 20180828
Implantable Lead Location: 753860
Implantable Pulse Generator Implant Date: 20190915
Lead Channel Impedance Value: 380 Ohm
Lead Channel Pacing Threshold Amplitude: 0.5 V
Lead Channel Pacing Threshold Pulse Width: 0.5 ms
Lead Channel Sensing Intrinsic Amplitude: 11.9 mV
Lead Channel Setting Pacing Amplitude: 2.5 V
Lead Channel Setting Pacing Pulse Width: 0.5 ms
Lead Channel Setting Sensing Sensitivity: 0.5 mV
Pulse Gen Serial Number: 9851932

## 2019-10-08 NOTE — Patient Instructions (Signed)
Call if you have any concerns or see you blood pressure average go significantly over 130/80  Check your blood pressure at home several times each week and keep record of the readings.  Take your BP meds as follows:  Continue with all current medications  Bring all of your meds, your BP cuff and your record of home blood pressures to your next appointment.  Exercise as you're able, try to walk approximately 30 minutes per day.  Keep salt intake to a minimum, especially watch canned and prepared boxed foods.  Eat more fresh fruits and vegetables and fewer canned items.  Avoid eating in fast food restaurants.    HOW TO TAKE YOUR BLOOD PRESSURE: . Rest 5 minutes before taking your blood pressure. .  Don't smoke or drink caffeinated beverages for at least 30 minutes before. . Take your blood pressure before (not after) you eat. . Sit comfortably with your back supported and both feet on the floor (don't cross your legs). . Elevate your arm to heart level on a table or a desk. . Use the proper sized cuff. It should fit smoothly and snugly around your bare upper arm. There should be enough room to slip a fingertip under the cuff. The bottom edge of the cuff should be 1 inch above the crease of the elbow. . Ideally, take 3 measurements at one sitting and record the average.

## 2019-10-08 NOTE — Progress Notes (Signed)
Jeffery Thomas, RHP consulted with Dr Sallyanne Kuster and advised since patient is asymptomatic at this time then to recheck remote transmission next week.   Call back to patient and advised of recommendations to recheck next week, limit salt and fluid intake as discussed at previous call.  Advised to call if he experiences any fluid symptoms which were reviewed at call.

## 2019-10-08 NOTE — Progress Notes (Signed)
S: CC: Hypertension HPI: Jeffery Thomas is a 68 YO male referred by Dr. Gwenlyn Found to the HTN clinic. PMH includes vfib arrest in 8/18, ICD implant (never discharged), HTN, HLD, GERD and right THA. Of note, patient's BP at last appointment was 176/98 and it was noted that his BP is usually much lower than that. Pt had right THA on 09/18/19, performed by Dr. Maureen Ralphs, with no complications noted.  Today he presents to the HTN clinic for his initial appointment. He states that his surgery went very well, and his recovery has gone smoothly. He was nervous for his surgery due to having a failed TKA in the past and noted that his physical therapy was much harder with his knee replacements. He is excited to start playing golf and walking again now that his hip is better. Pt has not worked in 35 years and enjoys having no stress in his life. He cooks dinner for his wife every night. They recently welcomed a pug puppy into their home, the puppy is 66 months old and acting every bit his age. He noted that at his clearance appointment with Dr. Gwenlyn Found he was in a lot of pain and thinks that that was contributing to his high BP reading.  His BP today was 150/100 with a HR of 56 bpm Suspect patient has some white-coat syndrome as home BP readings (listed below) are at goal  FH: Father passed at 20, hx of HTN, dialysis, liver and kidney issues (dx in his late 66s); mother died at 98 from an MI, hx of HTN; 2 brothers, youngest brother died at age 41 due to pancreatic cancer, other brother still living and healthy; 1 daughter with no health issues  SH: former smoker when playing golf (quit 8/18), preferred cigars, never cigarettes/smokeless tobacco; former alcohol use (quit 8/18); drinks 3 cups of coffee every morning Diet: low-sodium diet, primarily eating home cooked meals now due to [place]. He enjoys cooking and does not use salt in his cooking, prefers pepper and other seasonings. Drinks only water besides his 3 cups of  coffee. Exercise: prior to his hip pain he was walking 5-6 miles a day. He plans to begin walking again and playing golf now that his hip feels so much better. Of note, he does not plan to start smoking cigars again while playing golf Home BP Readings: Pre-THA Average: 151/87 (27 readings) Post-THA Average: 132/79 (21 readings) O: PMH:  HTN Last BP 176/98 on 09/03/19; carvedilol, losartan and spironolactone  HLD Last lipid panel TC 118, LDL 65 and Trigs 77 on 07/17/18; atorvastatin  GERD Omeprazole  THA Performed on 09/18/19; rivaroxaban, hydrocodone/acetaminophen, methocarbamol, tramadol   Meds: atorvastatin 20mg  PO daily, carvedilol 25mg  12.5mg  PO BID, hydrocodone/acetaminophen 5/325 1-2 tablets PO q6h PRN for severe pain, losartan 100mg  PO daily, methocarbamol 500mg  PO q6h PRN muscle spasms, montelukast 10mg  PO daily, omeprazole 40mg  PO daily, rivaroxaban 10mg  PO daily with breakfast, spironolactone 25mg  PO daily, tramadol 50mg  1-2 tablets PO q6h PRN for moderate pain BP Goal: less than 130/80 ALL: NKDA VS:  Wt Readings from Last 3 Encounters: 09/18/19  208 lb 1.8 oz (94.4 kg) 09/03/19  206 lb 6.4 oz (93.6 kg) 05/27/19  200 lb (90.7 kg)  BP Readings from Last 3 Encounters: 09/03/19  176/98 05/27/19  128/88 12/12/18  138/76 Pulse Readings from Last 3 Encounters: 09/03/19  54 05/27/19  54 12/12/18  62  Labs: SCr 0.91, CrCl (IBW) 87.6 mL/min, K 4.3 A: 1. Hypertension; currently on losartan  100mg  daily, carvedilol 12.5mg  BID, spironolactone 25mg  daily P: 1. Will continue BP medications as prescribed. He will continue to record his BP at home 3-4 times a week and was instructed to call if his BP was trending high. Pt will follow-up with device clinic in early 2021.  Orbie Pyo, PharmD Candidate  [employer] [facility], Class of 2021  I was with student and patient for entire visit and agree with the above assessment and plan.  [name] PharmD CPP [facility] at  [facility] [address] [place], [place] [address]  ADDENDUM:  After patient left office, received information from device clinic that patient was appearing to be volume overloaded on ICD download.  Patient had no symptoms of fluid overload and weight was stable.  Reviewed information with Dr. Sallyanne Kuster (as to whether patient needs diuretic treatment).  Because patient had hip replacement surgery 3 weeks ago, and is currently weight stable and asymptomatic, will have him continue with current medications and device clinic will follow up in 1 week to determine if fluid status improves.   Patient was advised by device clinic to contact them in the interim with any concern.   [name] PharmD CPP [facility]

## 2019-10-08 NOTE — Progress Notes (Signed)
EPIC Encounter for ICM Monitoring  Patient Name: Jeffery Thomas is a 68 y.o. male Date: 10/08/2019 Primary Care Physican: Luetta Nutting, DO Primary [place] Electrophysiologist:Allred 10/27/2020Weight:206lbs -office weight  Spoke with patient and he is asymptomatic.  He reports drinking a lot of fluid and does not monitor salt intake.  He does not add salt from salt shaker to his foods.  He had office visit with Alvia Grove this morning.  Advised will consult with her regarding current medication since he is not taking a diuretic at this time.    CorvueThoracic impedancesignificantly decreased suggesting possible fluid accumulationsince 10/03/2019.    Labs: 09/19/2019 Creatinine 0.91, BUN 20, Potassium 4.3, Sodium 131, GFR >60 09/12/2019 Creatinine 0.92, BUN 21, Potassium 4.8, Sodium 139, GFR >60 05/02/2019 Creatinine 1.02, BUN 16, Potassium 4.6, Sodium 136, GFR >60 A complete set of results can be found in Results Review.  Recommendations:  Advised to limit salt intake to 2000 mg and fluid intake to 64 oz daily to decrease risk of further fluid accumulation.  Will forward to Dr Gwenlyn Found for review and available for [name] to review as well.  Follow-up plan: ICM clinic phone appointment on 10/16/2019 to recheck fluid levels.   91 day device clinic remote transmission 01/06/2020.    Copy of ICM check sent to Dr. Rayann Heman and Dr Gwenlyn Found for review.   3 month ICM trend: 10/08/2019    1 Year ICM trend:       Rosalene Billings, RN 10/08/2019 8:49 AM

## 2019-10-09 ENCOUNTER — Other Ambulatory Visit: Payer: Self-pay | Admitting: Family Medicine

## 2019-10-10 ENCOUNTER — Other Ambulatory Visit: Payer: Self-pay | Admitting: Cardiovascular Disease

## 2019-10-10 ENCOUNTER — Other Ambulatory Visit: Payer: Self-pay | Admitting: Family Medicine

## 2019-10-16 ENCOUNTER — Ambulatory Visit (INDEPENDENT_AMBULATORY_CARE_PROVIDER_SITE_OTHER): Payer: Medicare Other

## 2019-10-16 DIAGNOSIS — I428 Other cardiomyopathies: Secondary | ICD-10-CM

## 2019-10-16 DIAGNOSIS — Z4502 Encounter for adjustment and management of automatic implantable cardiac defibrillator: Secondary | ICD-10-CM

## 2019-10-17 LAB — CUP PACEART REMOTE DEVICE CHECK
Battery Remaining Longevity: 93 mo
Battery Remaining Percentage: 89 %
Battery Voltage: 3.05 V
Brady Statistic RV Percent Paced: 1 %
Date Time Interrogation Session: 20201209215618
HighPow Impedance: 69 Ohm
HighPow Impedance: 69 Ohm
Implantable Lead Implant Date: 20180828
Implantable Lead Location: 753860
Implantable Pulse Generator Implant Date: 20190915
Lead Channel Impedance Value: 380 Ohm
Lead Channel Pacing Threshold Amplitude: 0.5 V
Lead Channel Pacing Threshold Pulse Width: 0.5 ms
Lead Channel Sensing Intrinsic Amplitude: 11.9 mV
Lead Channel Setting Pacing Amplitude: 2.5 V
Lead Channel Setting Pacing Pulse Width: 0.5 ms
Lead Channel Setting Sensing Sensitivity: 0.5 mV
Pulse Gen Serial Number: 9851932

## 2019-10-18 ENCOUNTER — Telehealth: Payer: Self-pay

## 2019-10-18 ENCOUNTER — Other Ambulatory Visit: Payer: Self-pay

## 2019-10-18 ENCOUNTER — Other Ambulatory Visit: Payer: Self-pay | Admitting: Family Medicine

## 2019-10-18 DIAGNOSIS — K219 Gastro-esophageal reflux disease without esophagitis: Secondary | ICD-10-CM

## 2019-10-18 NOTE — Addendum Note (Signed)
Addended by: Rosalene Billings on: 10/18/2019 11:37 AM   Modules accepted: Level of Service

## 2019-10-18 NOTE — Telephone Encounter (Signed)
Remote ICM transmission received.  Attempted call to patient regarding ICM remote transmission and left detailed message per DPRvised to return call for any fluid symptoms or questions. Next ICM remote transmission scheduled 11/18/2019.

## 2019-10-18 NOTE — Progress Notes (Signed)
EPIC Encounter for ICM Monitoring  Patient Name: Jeffery Thomas is a 68 y.o. male Date: 10/18/2019 Primary Care Physican: Luetta Nutting, DO Primary Cardiologist:[place] Electrophysiologist:Allred 10/27/2020Weight:206lbs -office weight  Attempted call to patient and unable to reach.  Left detailed message per DPR regarding transmission. Transmission reviewed.   CorvueThoracic impedancereturned to normal since 10/08/2019 remote transmission.   Labs: 09/19/2019 Creatinine 0.91, BUN 20, Potassium 4.3, Sodium 131, GFR >60 09/12/2019 Creatinine 0.92, BUN 21, Potassium 4.8, Sodium 139, GFR >60 05/02/2019 Creatinine 1.02, BUN 16, Potassium 4.6, Sodium 136, GFR >60 A complete set of results can be found in Results Review.  Recommendations:  Left voice mail with ICM number and encouraged to call if experiencing any fluid symptoms.  Follow-up plan: ICM clinic phone appointment on 11/18/2019.   91 day device clinic remote transmission 01/06/2020.    Copy of ICM check sent to Dr. Rayann Heman.  3 month ICM trend: 10/16/2019    1 Year ICM trend:       Rosalene Billings, RN 10/18/2019 11:20 AM

## 2019-11-18 ENCOUNTER — Ambulatory Visit (INDEPENDENT_AMBULATORY_CARE_PROVIDER_SITE_OTHER): Payer: Medicare Other

## 2019-11-18 DIAGNOSIS — Z4502 Encounter for adjustment and management of automatic implantable cardiac defibrillator: Secondary | ICD-10-CM

## 2019-11-18 DIAGNOSIS — I428 Other cardiomyopathies: Secondary | ICD-10-CM | POA: Diagnosis not present

## 2019-11-18 NOTE — Progress Notes (Signed)
EPIC Encounter for ICM Monitoring  Patient Name: Jeffery Thomas is a 69 y.o. male Date: 11/18/2019 Primary Care Physican: Luetta Nutting, DO Primary Cardiologist:[place] Electrophysiologist:Allred 1/11/2021Weight:211lbs   Spoke with patient.  He does not think he has fluid symptoms but has gained a couple of pounds in the last couple of weeks.  Has a little swelling in abdominal area.  He has been eating more restaurant foods in the last couple of weeks such as wings, bread sticks and pizza.  CorvueThoracic impedancereturned to normal since 10/08/2019 remote transmission.  Labs: 09/19/2019 Creatinine 0.91, BUN 20, Potassium 4.3, Sodium 131, GFR >60 09/12/2019 Creatinine 0.92, BUN 21, Potassium 4.8, Sodium 139, GFR >60 05/02/2019 Creatinine 1.02, BUN 16, Potassium 4.6, Sodium 136, GFR >60 A complete set of results can be found in Results Review.  Recommendations:Recommendation to limit salt intake to 2000 mg daily and fluid intake to 64 oz daily.  Encouraged to call if experiencing any fluid symptoms.   Follow-up plan: ICM clinic phone appointment on1/19/2021 to recheck fluid levels. 91 day device clinic remote transmission 01/06/2020.   Copy of ICM check sent to Dr.Allred.  3 month ICM trend: 11/18/2019    1 Year ICM trend:       Rosalene Billings, RN 11/18/2019 3:03 PM

## 2019-11-26 ENCOUNTER — Telehealth: Payer: Self-pay

## 2019-11-26 NOTE — Telephone Encounter (Signed)
Left message for patient to remind of missed remote transmission.  

## 2019-12-03 ENCOUNTER — Telehealth: Payer: Self-pay | Admitting: Family Medicine

## 2019-12-03 NOTE — Telephone Encounter (Signed)
Patient is requesting a TOC from Dr. Zigmund Daniel to Dr. Ethelene Hal due to Dr. Zigmund Daniel leaving the practice. Pls advise. CB is [phone]

## 2019-12-03 NOTE — Telephone Encounter (Signed)
Dr. Ethelene Hal is it okay for TOC. Please advise.

## 2019-12-06 NOTE — Progress Notes (Signed)
No ICM remote transmission received for 11/26/2019 and next ICM transmission scheduled for 12/23/2019.

## 2019-12-23 ENCOUNTER — Ambulatory Visit (INDEPENDENT_AMBULATORY_CARE_PROVIDER_SITE_OTHER): Payer: Medicare Other

## 2019-12-23 DIAGNOSIS — I428 Other cardiomyopathies: Secondary | ICD-10-CM | POA: Diagnosis not present

## 2019-12-23 DIAGNOSIS — Z4502 Encounter for adjustment and management of automatic implantable cardiac defibrillator: Secondary | ICD-10-CM | POA: Diagnosis not present

## 2019-12-27 NOTE — Progress Notes (Signed)
EPIC Encounter for ICM Monitoring  Patient Name: ZACARIA GEGG is a 69 y.o. male Date: 12/27/2019 Primary Care Physican: Luetta Nutting, DO Primary Cardiologist:[place] Electrophysiologist:Allred 1/11/2021Weight:211lbs   Transmission reviewed.  CorvueThoracic impedancenormal.  Labs: 09/19/2019 Creatinine 0.91, BUN 20, Potassium 4.3, Sodium 131, GFR >60 09/12/2019 Creatinine 0.92, BUN 21, Potassium 4.8, Sodium 139, GFR >60 05/02/2019 Creatinine 1.02, BUN 16, Potassium 4.6, Sodium 136, GFR >60 A complete set of results can be found in Results Review.  Recommendations:None.   Follow-up plan: ICM clinic phone appointment on3/22/2021 to recheck fluid levels. 91 day device clinic remote transmission 01/06/2020.   Copy of ICM check sent to Dr.Allred.  3 month ICM trend: 12/23/2019    1 Year ICM trend:       Rosalene Billings, RN 12/27/2019 5:01 PM

## 2020-01-06 ENCOUNTER — Ambulatory Visit (INDEPENDENT_AMBULATORY_CARE_PROVIDER_SITE_OTHER): Payer: Medicare Other | Admitting: *Deleted

## 2020-01-06 DIAGNOSIS — Z8674 Personal history of sudden cardiac arrest: Secondary | ICD-10-CM | POA: Diagnosis not present

## 2020-01-07 ENCOUNTER — Other Ambulatory Visit: Payer: Self-pay | Admitting: Family Medicine

## 2020-01-08 LAB — CUP PACEART REMOTE DEVICE CHECK
Battery Remaining Longevity: 92 mo
Battery Remaining Percentage: 88 %
Battery Voltage: 3.02 V
Brady Statistic RV Percent Paced: 1 %
Date Time Interrogation Session: 20210303002327
HighPow Impedance: 83 Ohm
HighPow Impedance: 83 Ohm
Implantable Lead Implant Date: 20180828
Implantable Lead Location: 753860
Implantable Pulse Generator Implant Date: 20190915
Lead Channel Impedance Value: 410 Ohm
Lead Channel Pacing Threshold Amplitude: 0.5 V
Lead Channel Pacing Threshold Pulse Width: 0.5 ms
Lead Channel Sensing Intrinsic Amplitude: 11.9 mV
Lead Channel Setting Pacing Amplitude: 2.5 V
Lead Channel Setting Pacing Pulse Width: 0.5 ms
Lead Channel Setting Sensing Sensitivity: 0.5 mV
Pulse Gen Serial Number: 9851932

## 2020-01-08 NOTE — Progress Notes (Signed)
ICD Remote  

## 2020-01-27 ENCOUNTER — Ambulatory Visit: Payer: Medicare Other

## 2020-01-27 DIAGNOSIS — Z95810 Presence of automatic (implantable) cardiac defibrillator: Secondary | ICD-10-CM

## 2020-01-27 DIAGNOSIS — I428 Other cardiomyopathies: Secondary | ICD-10-CM

## 2020-01-29 NOTE — Progress Notes (Signed)
EPIC Encounter for ICM Monitoring  Patient Name: Jeffery Thomas is a 69 y.o. male Date: 01/29/2020 Primary Care Physican: Luetta Nutting, DO Primary Cardiologist:[place] Electrophysiologist:Allred 3/24/2021Weight:215lbs   Spoke with patient and reports feeling well at this time.  Denies fluid symptoms.    CorvueThoracic impedancenormal.  Labs: 09/19/2019 Creatinine 0.91, BUN 20, Potassium 4.3, Sodium 131, GFR >60 09/12/2019 Creatinine 0.92, BUN 21, Potassium 4.8, Sodium 139, GFR >60 05/02/2019 Creatinine 1.02, BUN 16, Potassium 4.6, Sodium 136, GFR >60 A complete set of results can be found in Results Review.  Recommendations:No changes and encouraged to call if experiencing any fluid symptoms.  Follow-up plan: ICM clinic phone appointment on4/26/2021. 91 day device clinic remote transmission 04/08/2020.  Advised patient his last OV with Dr Rayann Heman was 11/23/2018 and he agreed to have scheduler call him to make an appointment.  Copy of ICM check sent to Dr.Allred.  3 month ICM trend: 01/27/2020    1 Year ICM trend:       Rosalene Billings, RN 01/29/2020 3:49 PM

## 2020-02-11 ENCOUNTER — Other Ambulatory Visit: Payer: Self-pay | Admitting: Cardiovascular Disease

## 2020-02-19 NOTE — Progress Notes (Signed)
Electrophysiology Office Note Date: 02/21/2020  ID:  Jeffery Thomas, DOB 20-Apr-1951, MRN [id number]  PCP: Luetta Nutting, DO Primary Cardiologist: Gwenlyn Found Electrophysiologist: Allred  CC: Routine ICD follow-up  Jeffery Thomas is a 69 y.o. male seen today for Dr Rayann Heman.  He presents today for routine electrophysiology followup.  Since last being seen in our clinic, the patient reports doing very well.  He had a hip replacement in 09/2019.  He denies chest pain, palpitations, dyspnea, PND, orthopnea, nausea, vomiting, dizziness, syncope, edema, weight gain, or early satiety.  He has not had ICD shocks. He has gotten both doses of COVID vaccine.   Device History: STJ single chamber ICD implanted 2018 for VF arrest; gen change 2/2 battery depletion 2019 History of appropriate therapy: No History of AAD therapy: No   Past Medical History:  Diagnosis Date  . AICD (automatic cardioverter/defibrillator) present   . Chronic systolic CHF (congestive heart failure) ([place])   . Gastroesophageal reflux disease   . History of alcohol abuse    quit 08/ 20/ 2018  . History of cardiac arrest   . History of encephalopathy 07/05/2017   anoxic-ischemic   . History of ST elevation myocardial infarction (STEMI) 06/26/2017   sudden cardiac arrest /  per cardiac cath 06-26-2017 normal coronaries, ef <20% , severe LVSD; dx takatsuki syndrome  . History of sudden cardiac arrest 06/26/2017   VF arrest / STEMI   . Hypertension   . 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 Inguinal hernia    left  . Left hydrocele   . Left inguinal hernia   . Left inguinal hernia 05/07/2019  . Myocardial infarction ([place])   . NICM (nonischemic cardiomyopathy) ([place])    06-26-2017  per cardiac cath , ef <20% (echo 35-40%)  ;  last echo 10-09-2014 ef 60-65%  . OSA (obstructive sleep apnea)    03-26-2019 per pt  had sleep study after heart attack, was told no recommendation , but did stop smoking and alcohol  . Pre-diabetes    denies  . 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 CATARACT EXTRACTION W/ INTRAOCULAR LENS IMPLANT Left 2017  . COLONOSCOPY    . HYDROCELE EXCISION Left 04/02/2019   Procedure: HYDROCELECTOMY ADULT;  Surgeon: Festus Aloe, MD;  Location: [facility];  Service: Urology;  Laterality: Left;  . ICD GENERATOR CHANGEOUT N/A 07/22/2018   Procedure: [place];  Surgeon: Evans Lance, MD;  Location: [place] [facility];  Service: Cardiovascular;  Laterality: N/A;  . ICD IMPLANT N/A 07/04/2017   SJM Fortify Assura VR ICD implanted by Dr Rayann Heman for secondary prevention after VF arrest  . INGUINAL HERNIA REPAIR Left 05/07/2019   Procedure: OPEN REPAIR LEFT INGUINAL HERNIA WITH MESH;  Surgeon: Fanny Skates, MD;  Location: [facility];  Service: General;  Laterality: Left;  . LEFT HEART CATH AND CORONARY ANGIOGRAPHY N/A 06/26/2017   Procedure: LEFT HEART CATH AND CORONARY ANGIOGRAPHY;  Surgeon: Lorretta Harp, MD;  Location: [place] [facility];  Service: Cardiovascular;  Laterality: N/A;  . MENISCUS REPAIR Left 1983   left knee  . TOTAL HIP ARTHROPLASTY Right 09/18/2019   Procedure: TOTAL HIP ARTHROPLASTY ANTERIOR APPROACH;  Surgeon: Gaynelle Arabian, MD;  Location: [facility];  Service: Orthopedics;  Laterality: Right;  . TOTAL KNEE ARTHROPLASTY Left 2009  approx.  Marland Kitchen TOTAL KNEE REVISION Left 04/18/2018   Procedure: LEFT TOTAL KNEE REVISION;  Surgeon: Gaynelle Arabian, MD;  Location: [facility];  Service: Orthopedics;  Laterality: Left;  Adductor Block    Current Outpatient Medications  Medication Sig Dispense Refill  . atorvastatin (LIPITOR) 20 MG tablet TAKE 1 TABLET DAILY 90 tablet 3  . carvedilol (COREG) 25 MG tablet Take 0.5 tablets (12.5 mg total) by mouth  2 (two) times daily. 90 tablet 1  . HYDROcodone-acetaminophen (NORCO/VICODIN) 5-325 MG tablet Take 1-2 tablets by mouth every 6 (six) hours as needed for severe pain. 56 tablet 0  . losartan (COZAAR) 100 MG tablet TAKE 1 TABLET DAILY 90 tablet 2  . meloxicam (MOBIC) 15 MG tablet Take 1qd (Plz sched visit with new provider for future fills) 90 tablet 0  . methocarbamol (ROBAXIN) 500 MG tablet Take 1 tablet (500 mg total) by mouth every 6 (six) hours as needed for muscle spasms. 40 tablet 0  . montelukast (SINGULAIR) 10 MG tablet Take 1 tablet (10 mg total) by mouth daily. 90 tablet 2  . omeprazole (PRILOSEC) 40 MG capsule TAKE 1 CAPSULE DAILY 90 capsule 2  . spironolactone (ALDACTONE) 25 MG tablet TAKE 1 TABLET DAILY (Patient taking differently: Take 25 mg by mouth daily. ) 90 tablet 3  . traMADol (ULTRAM) 50 MG tablet Take 1-2 tablets (50-100 mg total) by mouth every 6 (six) hours as needed for moderate pain. 40 tablet 0   No current facility-administered medications for this visit.    Allergies:   Patient has no known allergies.   Social History: Social History   Socioeconomic History  . Marital status: Married    Spouse name: Not on file  . Number of children: Not on file  . Years of education: Not on file  . Highest education level: Not on file  Occupational History  . Not on file  Tobacco Use  . Smoking status: Former Smoker    Types: Cigars    Quit date: 06/26/2017    Years since quitting: 2.6  . Smokeless tobacco: Never Used  Substance and Sexual Activity  . Alcohol use: Not Currently    Comment: quit 2018  . Drug use: Never  . Sexual activity: Not on file  Other Topics Concern  . Not on file  Social History Narrative  . Not on file   Social Determinants of Health   Financial Resource Strain:   . Difficulty of Paying Living Expenses:   Food Insecurity:   . Worried About Charity fundraiser in the Last Year:   . Arboriculturist in the Last Year:   Transportation  Needs:   . Film/video editor (Medical):   Marland Kitchen Lack of Transportation (Non-Medical):   Physical Activity:   . Days of Exercise per Week:   . Minutes of Exercise per Session:   Stress:   . Feeling of Stress :   Social Connections:   . Frequency of Communication with Friends and Family:   . Frequency of Social Gatherings with Friends and Family:   . Attends Religious Services:   . Active Member of Clubs or Organizations:   . Attends Archivist Meetings:   Marland Kitchen Marital Status:   Intimate Partner Violence:   . Fear of Current or Ex-Partner:   . Emotionally Abused:   Marland Kitchen Physically Abused:   . Sexually Abused:     Family History: Family  History  Problem Relation Age of Onset  . Hypertension Mother   . Diabetes Father   . Cancer Brother        pancreatic    Review of Systems: All other systems reviewed and are otherwise negative except as noted above.   Physical Exam: VS:  BP (!) 160/90   Pulse 64   Ht 6' (1.829 m)   Wt 217 lb (98.4 kg)   SpO2 (!) 64%   BMI 29.43 kg/m  , BMI Body mass index is 29.43 kg/m.  GEN- The patient is well appearing, alert and oriented x 3 today.   HEENT: normocephalic, atraumatic; sclera clear, conjunctiva pink; hearing intact; oropharynx clear; neck supple Lungs- Clear to ausculation bilaterally, normal work of breathing.  No wheezes, rales, rhonchi Heart- Regular rate and rhythm, no murmurs, rubs or gallops GI- soft, non-tender, non-distended, bowel sounds present Extremities- no clubbing, cyanosis, or edema MS- no significant deformity or atrophy Skin- warm and dry, no rash or lesion; ICD pocket well healed Psych- euthymic mood, full affect Neuro- strength and sensation are intact  ICD interrogation- reviewed in detail today,  See PACEART report  EKG:  EKG is not ordered today.  Recent Labs: 09/12/2019: ALT 28 09/19/2019: BUN 20; Creatinine, Ser 0.91; Hemoglobin 13.3; Platelets 219; Potassium 4.3; Sodium 131   Wt Readings  from Last 3 Encounters:  02/21/20 217 lb (98.4 kg)  10/08/19 209 lb 12.8 oz (95.2 kg)  09/18/19 208 lb 1.8 oz (94.4 kg)     Other studies Reviewed: Additional studies/ records that were reviewed today include: Dr Rayann Heman and Dr Kennon Holter notes  Assessment and Plan:  1.  Chronic systolic dysfunction euvolemic today EF has recovered Stable on an appropriate medical regimen Normal ICD function See Pace Art report No changes today  2.  VF arrest No recurrent arrhythmias  3.  HTN BP elevated today in clinic - readings are stable at home with SBP ~130's    Current medicines are reviewed at length with the patient today.   The patient does not have concerns regarding his medicines.  The following changes were made today:  none  Labs/ tests ordered today include: none Orders Placed This Encounter  Procedures  . CUP PACEART INCLINIC DEVICE CHECK     Disposition:   Follow up with Delilah Shan, Dr Rayann Heman 1 year    Signed, Chanetta Marshall, NP 02/21/2020 8:34 AM  [place] [id number] [phone] (office) [phone] (fax)

## 2020-02-21 ENCOUNTER — Ambulatory Visit (INDEPENDENT_AMBULATORY_CARE_PROVIDER_SITE_OTHER): Payer: Medicare Other | Admitting: Nurse Practitioner

## 2020-02-21 ENCOUNTER — Other Ambulatory Visit: Payer: Self-pay

## 2020-02-21 ENCOUNTER — Encounter: Payer: Self-pay | Admitting: Nurse Practitioner

## 2020-02-21 VITALS — BP 160/90 | HR 64 | Ht 72.0 in | Wt 217.0 lb

## 2020-02-21 DIAGNOSIS — I10 Essential (primary) hypertension: Secondary | ICD-10-CM | POA: Diagnosis not present

## 2020-02-21 DIAGNOSIS — I428 Other cardiomyopathies: Secondary | ICD-10-CM

## 2020-02-21 DIAGNOSIS — I4901 Ventricular fibrillation: Secondary | ICD-10-CM

## 2020-02-21 LAB — CUP PACEART INCLINIC DEVICE CHECK
Battery Remaining Longevity: 92 mo
Brady Statistic RV Percent Paced: 0.01 %
Date Time Interrogation Session: 20210416083231
HighPow Impedance: 74.25 Ohm
Implantable Lead Implant Date: 20180828
Implantable Lead Location: 753860
Implantable Pulse Generator Implant Date: 20190915
Lead Channel Impedance Value: 375 Ohm
Lead Channel Pacing Threshold Amplitude: 0.5 V
Lead Channel Pacing Threshold Amplitude: 0.5 V
Lead Channel Pacing Threshold Pulse Width: 0.5 ms
Lead Channel Pacing Threshold Pulse Width: 0.5 ms
Lead Channel Sensing Intrinsic Amplitude: 11.9 mV
Lead Channel Setting Pacing Amplitude: 2.5 V
Lead Channel Setting Pacing Pulse Width: 0.5 ms
Lead Channel Setting Sensing Sensitivity: 0.5 mV
Pulse Gen Serial Number: 9851932

## 2020-02-21 NOTE — Patient Instructions (Signed)
Medication Instructions:  none *If you need a refill on your cardiac medications before your next appointment, please call your pharmacy*   Lab Work: none If you have labs (blood work) drawn today and your tests are completely normal, you will receive your results only by: Marland Kitchen MyChart Message (if you have MyChart) OR . A paper copy in the mail If you have any lab test that is abnormal or we need to change your treatment, we will call you to review the results.   Testing/Procedures: none   Follow-Up: At [facility], you and your health needs are our priority.  As part of our continuing mission to provide you with exceptional heart care, we have created designated Provider Care Teams.  These Care Teams include your primary Cardiologist (physician) and Advanced Practice Providers (APPs -  Physician Assistants and Nurse Practitioners) who all work together to provide you with the care you need, when you need it.   Your next appointment:   1 year(s)  The format for your next appointment:   Either In Person or Virtual  Provider:   Dr Rayann Heman   Other Instructions Remote monitoring is used to monitor your  ICD from home. This monitoring reduces the number of office visits required to check your device to one time per year. It allows [place] to keep an eye on the functioning of your device to ensure it is working properly. You are scheduled for a device check from home on 04/08/20. You may send your transmission at any time that day. If you have a wireless device, the transmission will be sent automatically. After your physician reviews your transmission, you will receive a postcard with your next transmission date.

## 2020-02-24 ENCOUNTER — Encounter: Payer: Self-pay | Admitting: Family Medicine

## 2020-02-24 ENCOUNTER — Ambulatory Visit (INDEPENDENT_AMBULATORY_CARE_PROVIDER_SITE_OTHER): Payer: Medicare Other | Admitting: Family Medicine

## 2020-02-24 ENCOUNTER — Other Ambulatory Visit: Payer: Self-pay

## 2020-02-24 VITALS — BP 160/100 | HR 60 | Temp 97.4°F | Ht 72.0 in | Wt 218.6 lb

## 2020-02-24 DIAGNOSIS — S29019A Strain of muscle and tendon of unspecified wall of thorax, initial encounter: Secondary | ICD-10-CM | POA: Insufficient documentation

## 2020-02-24 MED ORDER — KETOROLAC TROMETHAMINE 60 MG/2ML IM SOLN
60.0000 mg | Freq: Once | INTRAMUSCULAR | Status: AC
  Administered 2020-02-24: 60 mg via INTRAMUSCULAR

## 2020-02-24 MED ORDER — TRAMADOL HCL 50 MG PO TABS: 50.0000 mg | ORAL_TABLET | Freq: Two times a day (BID) | ORAL | 0 refills | Status: AC | PRN

## 2020-02-24 NOTE — Progress Notes (Signed)
Subjective:  Patient ID: Jeffery Thomas, male    DOB: 1951/08/21  Age: 69 y.o. MRN: [id number]  CC: Establish Care (Pt c/o lower back pain x3days.  Pt has a hx of back pain.)   HPI Jeffery Thomas presents for acute episode of mid back pain.  It started after he bent over to touch his toes in an effort to stretch.  Pain is on the left side.  Denies any muscle spasms.  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  Denies any history of renal lithiasis or hematuria.  There have been no changes in his urine.  Denies cough fever or chills.  Status post both Covid vaccines.  Outpatient Medications Prior to Visit  Medication Sig Dispense Refill  . atorvastatin (LIPITOR) 20 MG tablet TAKE 1 TABLET DAILY 90 tablet 3  . carvedilol (COREG) 25 MG tablet Take 0.5 tablets (12.5 mg total) by mouth 2 (two) times daily. 90 tablet 1  . losartan (COZAAR) 100 MG tablet TAKE 1 TABLET DAILY 90 tablet 2  . meloxicam (MOBIC) 15 MG tablet Take 1qd (Plz sched visit with new provider for future fills) 90 tablet 0  . montelukast (SINGULAIR) 10 MG tablet Take 1 tablet (10 mg total) by mouth daily. 90 tablet 2  . omeprazole (PRILOSEC) 40 MG capsule TAKE 1 CAPSULE DAILY 90 capsule 2  . spironolactone (ALDACTONE) 25 MG tablet TAKE 1 TABLET DAILY (Patient taking differently: Take 25 mg by mouth daily. ) 90 tablet 3  . HYDROcodone-acetaminophen (NORCO/VICODIN) 5-325 MG tablet Take 1-2 tablets by mouth every 6 (six) hours as needed for severe pain. (Patient not taking: Reported on 02/24/2020) 56 tablet 0  . methocarbamol (ROBAXIN) 500 MG tablet Take 1 tablet (500 mg total) by mouth every 6 (six) hours as needed for muscle spasms. (Patient not taking: Reported on 02/24/2020) 40 tablet 0  . traMADol (ULTRAM) 50 MG tablet Take 1-2 tablets (50-100 mg total) by mouth every 6 (six) hours as needed for moderate pain. (Patient not taking: Reported on 02/24/2020) 40 tablet 0   No facility-administered medications prior to visit.     ROS Review of Systems  Constitutional: Negative.   HENT: Negative.   Respiratory: Negative for cough, chest tightness, shortness of breath and wheezing.   Cardiovascular: Negative.   Gastrointestinal: Negative.  Negative for anal bleeding and blood in stool.  Genitourinary: Negative for difficulty urinating, hematuria and urgency.  Musculoskeletal: Positive for back pain. Negative for myalgias.  Allergic/Immunologic: Negative for immunocompromised state.  Neurological: Negative for weakness and numbness.  Psychiatric/Behavioral: Negative.     Objective:  BP (!) 160/100 (BP Location: Left Arm, Patient Position: Sitting, Cuff Size: Normal)   Pulse 60   Temp (!) 97.4 F (36.3 C) (Temporal)   Ht 6' (1.829 m)   Wt 218 lb 9.6 oz (99.2 kg)   SpO2 98%   BMI 29.65 kg/m   BP Readings from Last 3 Encounters:  02/24/20 (!) 160/100  02/21/20 (!) 160/90  10/08/19 (!) 150/96    Wt Readings from Last 3 Encounters:  02/24/20 218 lb 9.6 oz (99.2 kg)  02/21/20 217 lb (98.4 kg)  10/08/19 209 lb 12.8 oz (95.2 kg)    Physical Exam Vitals and nursing note reviewed.  Constitutional:      General: He is not in acute distress.    Appearance: Normal appearance. He is not ill-appearing, toxic-appearing or diaphoretic.  HENT:     Head: Normocephalic and atraumatic.     Right Ear: External  ear normal.     Left Ear: External ear normal.  Eyes:     General: No scleral icterus.       Right eye: No discharge.        Left eye: No discharge.     Conjunctiva/sclera: Conjunctivae normal.  Cardiovascular:     Rate and Rhythm: Normal rate and regular rhythm.  Pulmonary:     Effort: Pulmonary effort is normal.     Breath sounds: Normal breath sounds.  Abdominal:     Tenderness: There is no right CVA tenderness or left CVA tenderness.  Musculoskeletal:     Thoracic back: No deformity or spasms. Decreased range of motion.       Back:  Skin:    General: Skin is warm and dry.  Neurological:      Mental Status: He is alert and oriented to person, place, and time.  Psychiatric:        Mood and Affect: Mood normal.        Behavior: Behavior normal.     Lab Results  Component Value Date   WBC 14.1 (H) 09/19/2019   HGB 13.3 09/19/2019   HCT 42.2 09/19/2019   PLT 219 09/19/2019   GLUCOSE 193 (H) 09/19/2019   CHOL 118 07/17/2018   TRIG 77.0 07/17/2018   HDL 37.50 (L) 07/17/2018   LDLCALC 65 07/17/2018   ALT 28 09/12/2019   AST 19 09/12/2019   NA 131 (L) 09/19/2019   K 4.3 09/19/2019   CL 100 09/19/2019   CREATININE 0.91 09/19/2019   BUN 20 09/19/2019   CO2 21 (L) 09/19/2019   TSH 2.40 07/17/2018   PSA 0.77 07/17/2018   INR 1.0 09/12/2019   HGBA1C 6.1 (H) 09/12/2019    No results found.  Assessment & Plan:   Problem List Items Addressed This Visit      Musculoskeletal and Integument   Thoracic myofascial strain - Primary   Relevant Medications   traMADol (ULTRAM) 50 MG tablet     Meds ordered this encounter  Medications  . ketorolac (TORADOL) injection 60 mg  . traMADol (ULTRAM) 50 MG tablet    Sig: Take 1 tablet (50 mg total) by mouth every 12 (twelve) hours as needed for up to 5 days for moderate pain.    Dispense:  20 tablet    Refill:  0      Follow-up: Return if symptoms worsen or fail to improve, for follow up if bp does not decrease to less than 140/90 in the next few days.   Patient was given back exercises to start immediately.  He will take Tylenol 2 p.o. every 8 the next week.  He will use the tramadol mostly in the evening time as needed.  Follow-up in the next week or 2 if not improving.  We will recheck his blood pressure in a few days and follow-up if not decreasing.  Libby Maw, MD

## 2020-02-24 NOTE — Patient Instructions (Addendum)

## 2020-03-02 ENCOUNTER — Ambulatory Visit (INDEPENDENT_AMBULATORY_CARE_PROVIDER_SITE_OTHER): Payer: Medicare Other

## 2020-03-02 DIAGNOSIS — Z95810 Presence of automatic (implantable) cardiac defibrillator: Secondary | ICD-10-CM | POA: Diagnosis not present

## 2020-03-02 DIAGNOSIS — I428 Other cardiomyopathies: Secondary | ICD-10-CM

## 2020-03-04 ENCOUNTER — Telehealth: Payer: Self-pay

## 2020-03-04 NOTE — Telephone Encounter (Signed)
Remote ICM transmission received.  Attempted call to patient regarding ICM remote transmission and left detailed message per DPR.  Advised to return call for any fluid symptoms or questions.  

## 2020-03-04 NOTE — Progress Notes (Signed)
EPIC Encounter for ICM Monitoring  Patient Name: Jeffery Thomas is a 69 y.o. male Date: 03/04/2020 Primary Care Physican: Libby Maw, MD Primary Cardiologist:[facility] Electrophysiologist:Allred 3/24/2021Weight:215lbs   Attempted call to patient and unable to reach.  Left detailed message per DPR regarding transmission. Transmission reviewed.   CorvueThoracic impedancesuggesting possible dryness starting 02/27/2020. Impedance was decreased suggesting possible fluid accumulation 02/16/2020 - 02/26/2020.   Labs: 09/19/2019 Creatinine 0.91, BUN 20, Potassium 4.3, Sodium 131, GFR >60 09/12/2019 Creatinine 0.92, BUN 21, Potassium 4.8, Sodium 139, GFR >60 05/02/2019 Creatinine 1.02, BUN 16, Potassium 4.6, Sodium 136, GFR >60 A complete set of results can be found in Results Review.  Recommendations:Left voice mail with ICM number and encouraged to call if experiencing any fluid symptoms.  Follow-up plan: ICM clinic phone appointment on6/01/2020. 91 day device clinic remote transmission 04/08/2020.   Patient has been advised his last OV with Dr Rayann Heman was 11/23/2018 and message was sent for scheduler to call him 01/27/2020.  Copy of ICM check sent to Dr.Allred  3 month ICM trend: 03/02/2020    1 Year ICM trend:       Rosalene Billings, RN 03/04/2020 4:07 PM

## 2020-04-06 ENCOUNTER — Other Ambulatory Visit: Payer: Self-pay | Admitting: Family Medicine

## 2020-04-08 ENCOUNTER — Ambulatory Visit (INDEPENDENT_AMBULATORY_CARE_PROVIDER_SITE_OTHER): Payer: Medicare Other | Admitting: *Deleted

## 2020-04-08 DIAGNOSIS — I4901 Ventricular fibrillation: Secondary | ICD-10-CM

## 2020-04-08 DIAGNOSIS — I469 Cardiac arrest, cause unspecified: Secondary | ICD-10-CM

## 2020-04-10 ENCOUNTER — Ambulatory Visit (INDEPENDENT_AMBULATORY_CARE_PROVIDER_SITE_OTHER): Payer: Medicare Other

## 2020-04-10 DIAGNOSIS — Z4502 Encounter for adjustment and management of automatic implantable cardiac defibrillator: Secondary | ICD-10-CM | POA: Diagnosis not present

## 2020-04-10 DIAGNOSIS — I428 Other cardiomyopathies: Secondary | ICD-10-CM

## 2020-04-10 LAB — CUP PACEART REMOTE DEVICE CHECK
Battery Remaining Longevity: 89 mo
Battery Remaining Percentage: 85 %
Battery Voltage: 3.02 V
Brady Statistic RV Percent Paced: 1 %
Date Time Interrogation Session: 20210603165246
HighPow Impedance: 73 Ohm
HighPow Impedance: 73 Ohm
Implantable Lead Implant Date: 20180828
Implantable Lead Location: 753860
Implantable Pulse Generator Implant Date: 20190915
Lead Channel Impedance Value: 390 Ohm
Lead Channel Pacing Threshold Amplitude: 0.5 V
Lead Channel Pacing Threshold Pulse Width: 0.5 ms
Lead Channel Sensing Intrinsic Amplitude: 11.9 mV
Lead Channel Setting Pacing Amplitude: 2.5 V
Lead Channel Setting Pacing Pulse Width: 0.5 ms
Lead Channel Setting Sensing Sensitivity: 0.5 mV
Pulse Gen Serial Number: 9851932

## 2020-04-10 NOTE — Progress Notes (Signed)
EPIC Encounter for ICM Monitoring  Patient Name: Jeffery Thomas is a 69 y.o. male Date: 04/10/2020 Primary Care Physican: Libby Maw, MD Primary Cardiologist:[facility] Electrophysiologist:Allred 6/4/2021Weight:205lbs   Spoke with patient.  He denies any fluid symptoms and feeling fine.  He frequently eats at restaurants and advised restaurant foods are typically high in salt which may contribute to the possible fluid accumulation.  CorvueThoracic impedancesuggesting possible fluid accumulation since 03/25/2020 and is returning close to baseline.  Impedance also decreased from 5/3 - 5/13.  Prescribed: Spironolactone 25 mg take 1 tablet daily  Labs: 09/19/2019 Creatinine 0.91, BUN 20, Potassium 4.3, Sodium 131, GFR >60 09/12/2019 Creatinine 0.92, BUN 21, Potassium 4.8, Sodium 139, GFR >60 05/02/2019 Creatinine 1.02, BUN 16, Potassium 4.6, Sodium 136, GFR >60 A complete set of results can be found in Results Review.  Recommendations: Advised to avoid restaurant foods if possible and to limit salt intake to 2000 mg daily.  Advised to call if he experiences any fluid symptoms.  Follow-up plan: ICM clinic phone appointment on6/14/2021 to recheck fluid levels. 91 day device clinic remote transmission8/30/2021.    Patient has been advised his last OV with Dr Rayann Heman was 11/23/2018 and message was sent for scheduler to call him 04/10/2020.  Copy of ICM check sent to Dr.Allred and Dr Gwenlyn Found for review.    3 month ICM trend: 04/09/2020    1 Year ICM trend:       Rosalene Billings, RN 04/10/2020 3:09 PM

## 2020-04-13 NOTE — Progress Notes (Signed)
Remote ICD transmission.   

## 2020-04-28 NOTE — Progress Notes (Signed)
No ICM remote transmission received for 04/20/2020 and next ICM transmission scheduled for 05/25/2020.   °

## 2020-05-05 ENCOUNTER — Telehealth: Payer: Self-pay

## 2020-05-05 NOTE — Telephone Encounter (Signed)
   [facility] Pre-operative Risk Assessment    HEARTCARE STAFF: - Please ensure there is not already an duplicate clearance open for this procedure. - Under Visit Info/Reason for Call, type in Other and utilize the format Clearance MM/DD/YY or Clearance TBD. Do not use dashes or single digits. - If request is for dental extraction, please clarify the # of teeth to be extracted.  Request for surgical clearance:   1. What type of surgery is being performed? Extraction and implant    2. When is this surgery scheduled? TBD   3. What type of clearance is required (medical clearance vs. Pharmacy clearance to hold med vs. Both)? Medical   4. Are there any medications that need to be held prior to surgery and how long?   5. Practice name and name of physician performing surgery? [facility]   6. What is the office phone number? [phone]   7.   What is the office fax number? [phone]  8.   Anesthesia type (None, local, MAC, general) ? Local, possible light sedation    Jacqulynn Cadet 05/05/2020, 4:33 PM  _________________________________________________________________   (provider comments below)

## 2020-05-05 NOTE — Telephone Encounter (Signed)
Left a voice message for the requesting office requesting that someone give our office a call back with the number of teeth to be extracted for the upcoming procedure. Stated to asked to the Pre-Op callback pool.

## 2020-05-06 NOTE — Telephone Encounter (Signed)
Follow up  [facility] called back and they said they will extract one tooth and place bone graph

## 2020-05-06 NOTE — Telephone Encounter (Signed)
   Primary Cardiologist: Quay Burow, MD  Chart reviewed as part of pre-operative protocol coverage. Simple dental extractions are considered low risk procedures per guidelines and generally do not require any specific cardiac clearance. It is also generally accepted that for simple extractions and dental cleanings, there is no need to interrupt blood thinner therapy.   SBE prophylaxis is not required for the patient.  I will route this recommendation to the requesting party via Epic fax function and remove from pre-op pool.  Please call with questions.  Deberah Pelton, NP 05/06/2020, 10:13 AM

## 2020-05-07 NOTE — Telephone Encounter (Signed)
Forwarded last office visit note dated 02-21-20 by Chanetta Marshall, NP for requesting office to review pt's history via [facility] fax function

## 2020-05-07 NOTE — Telephone Encounter (Signed)
Follow up  Autom from [facility] calling they received clearance but the orthodontist wants to know what is the heart issue/diagnosis of the pt. She said to call them and look for [facility]

## 2020-05-27 ENCOUNTER — Telehealth: Payer: Self-pay

## 2020-05-27 NOTE — Telephone Encounter (Signed)
I let the pt know we did not get his scheduled transmission on 05-25-2020. He agreed to send one today.

## 2020-06-02 ENCOUNTER — Ambulatory Visit (INDEPENDENT_AMBULATORY_CARE_PROVIDER_SITE_OTHER): Payer: Medicare Other

## 2020-06-02 DIAGNOSIS — I428 Other cardiomyopathies: Secondary | ICD-10-CM

## 2020-06-02 DIAGNOSIS — Z95810 Presence of automatic (implantable) cardiac defibrillator: Secondary | ICD-10-CM

## 2020-06-04 ENCOUNTER — Ambulatory Visit (INDEPENDENT_AMBULATORY_CARE_PROVIDER_SITE_OTHER): Payer: Medicare Other

## 2020-06-04 VITALS — Ht 72.0 in | Wt 218.0 lb

## 2020-06-04 DIAGNOSIS — Z0000 Encounter for general adult medical examination without abnormal findings: Secondary | ICD-10-CM

## 2020-06-04 NOTE — Progress Notes (Signed)
Subjective:   Jeffery Thomas is a 69 y.o. male who presents for an Initial Medicare Annual Wellness Visit.  I connected with Kathan today by telephone and verified that I am speaking with the correct person using two identifiers. Location patient: home Location provider: work Persons participating in the virtual visit: patient, Marine scientist.    I discussed the limitations, risks, security and privacy concerns of performing an evaluation and management service by telephone and the availability of in person appointments. I also discussed with the patient that there may be a patient responsible charge related to this service. The patient expressed understanding and verbally consented to this telephonic visit.    Interactive audio and video telecommunications were attempted between this provider and patient, however failed, due to patient having technical difficulties OR patient did not have access to video capability.  We continued and completed visit with audio only.  Some vital signs may be absent or patient reported.   Time Spent with patient on telephone encounter: 25 minutes  Review of Systems     Cardiac Risk Factors include: advanced age (>18men, >25 women);hypertension;dyslipidemia;male gender     Objective:    Today's Vitals   06/04/20 0857  Weight: (!) 218 lb (98.9 kg)  Height: 6' (1.829 m)   Body mass index is 29.57 kg/m.  Advanced Directives 06/04/2020 09/18/2019 09/12/2019 05/02/2019 04/02/2019 07/21/2018 04/18/2018  Does Patient Have a Medical Advance Directive? Yes Yes Yes Yes Yes Yes Yes  Type of Advance Directive Living will [place];Living will [place];Living will [place];Living will [place];Living will Living will Living will  Does patient want to make changes to medical advance directive? - No - Patient declined No - Patient declined No - Patient declined - No - Patient declined No - Patient  declined  Copy of [place] in Chart? - - No - copy requested No - copy requested No - copy requested - -  Would patient like information on creating a medical advance directive? - - - - - - -    Current Medications (verified) Outpatient Encounter Medications as of 06/04/2020  Medication Sig  . amoxicillin (AMOXIL) 500 MG tablet amoxicillin 500 mg tablet  4 tablets 1 hour prior to dental procedure  . atorvastatin (LIPITOR) 20 MG tablet TAKE 1 TABLET DAILY  . carvedilol (COREG) 25 MG tablet Take 0.5 tablets (12.5 mg total) by mouth 2 (two) times daily.  . cholecalciferol (VITAMIN D3) 25 MCG (1000 UNIT) tablet Take 1,000 Units by mouth daily.  Marland Kitchen losartan (COZAAR) 100 MG tablet TAKE 1 TABLET DAILY  . meloxicam (MOBIC) 15 MG tablet Take 1qd  . montelukast (SINGULAIR) 10 MG tablet Take 1 tablet (10 mg total) by mouth daily.  Marland Kitchen omeprazole (PRILOSEC) 40 MG capsule TAKE 1 CAPSULE DAILY  . HYDROcodone-acetaminophen (NORCO/VICODIN) 5-325 MG tablet Take 1-2 tablets by mouth every 6 (six) hours as needed for severe pain. (Patient not taking: Reported on 02/24/2020)  . spironolactone (ALDACTONE) 25 MG tablet TAKE 1 TABLET DAILY (Patient not taking: Reported on 06/04/2020)  . [DISCONTINUED] methocarbamol (ROBAXIN) 500 MG tablet Take 1 tablet (500 mg total) by mouth every 6 (six) hours as needed for muscle spasms. (Patient not taking: Reported on 02/24/2020)   No facility-administered encounter medications on file as of 06/04/2020.    Allergies (verified) Patient has no known allergies.   History: Past Medical History:  Diagnosis Date  . AICD (automatic cardioverter/defibrillator) present   .  Chronic systolic CHF (congestive heart failure) ([place])   . Gastroesophageal reflux disease   . History of alcohol abuse    quit 08/ 20/ 2018  . History of cardiac arrest   . History of encephalopathy 07/05/2017   anoxic-ischemic   . History of ST elevation myocardial infarction (STEMI)  06/26/2017   sudden cardiac arrest /  per cardiac cath 06-26-2017 normal coronaries, ef <20% , severe LVSD; dx takatsuki syndrome  . History of sudden cardiac arrest 06/26/2017   VF arrest / STEMI   . Hypertension   . 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 Inguinal hernia    left  . Left hydrocele   . Left inguinal hernia   . Left inguinal hernia 05/07/2019  . Myocardial infarction ([place])   . NICM (nonischemic cardiomyopathy) ([place])    06-26-2017  per cardiac cath , ef <20% (echo 35-40%)  ;  last echo 10-09-2014 ef 60-65%  . OSA (obstructive sleep apnea)    03-26-2019 per pt had sleep study after heart attack, was told no recommendation , but did stop smoking and alcohol  . Pre-diabetes    denies  . 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 CATARACT EXTRACTION W/ INTRAOCULAR LENS IMPLANT Left 2017  . COLONOSCOPY    . HYDROCELE EXCISION Left 04/02/2019   Procedure: HYDROCELECTOMY ADULT;  Surgeon: Festus Aloe, MD;  [facility]: [facility];  Service: Urology;  Laterality: Left;  . ICD GENERATOR CHANGEOUT N/A 07/22/2018   Procedure: Orin;  Surgeon: Evans Lance, MD;  Location: [facility];  Service: Cardiovascular;  Laterality: N/A;  . ICD IMPLANT N/A 07/04/2017   SJM Fortify Assura VR ICD implanted by Dr Rayann Heman for secondary prevention after VF arrest  . INGUINAL HERNIA REPAIR Left 05/07/2019   Procedure: OPEN REPAIR LEFT INGUINAL HERNIA WITH MESH;  Surgeon: Fanny Skates, MD;  Location: [facility];  Service: General;  Laterality: Left;  . LEFT HEART CATH AND CORONARY ANGIOGRAPHY N/A 06/26/2017   Procedure: LEFT HEART CATH AND CORONARY ANGIOGRAPHY;  Surgeon: Lorretta Harp, MD;  Location: [facility];  Service:  Cardiovascular;  Laterality: N/A;  . MENISCUS REPAIR Left 1983   left knee  . TOTAL HIP ARTHROPLASTY Right 09/18/2019   Procedure: TOTAL HIP ARTHROPLASTY ANTERIOR APPROACH;  Surgeon: Gaynelle Arabian, MD;  Location: [facility];  Service: Orthopedics;  Laterality: Right;  . TOTAL KNEE ARTHROPLASTY Left 2009  approx.  Marland Kitchen TOTAL KNEE REVISION Left 04/18/2018   Procedure: LEFT TOTAL KNEE REVISION;  Surgeon: Gaynelle Arabian, MD;  Location: [facility];  Service: Orthopedics;  Laterality: Left;  Adductor Block   Family History  Problem Relation Age of Onset  . Hypertension Mother   . Diabetes Father   . Cancer Brother        pancreatic   Social History   Socioeconomic History  . Marital status: Married    Spouse name: Not on file  . Number of children: Not on file  . Years of education: Not on file  . Highest education level: Not on file  Occupational History  . Not on file  Tobacco Use  . Smoking status: Former Smoker    Types: Cigars    Quit date: 06/26/2017    Years since quitting: 2.9  .  Smokeless tobacco: Never Used  Vaping Use  . Vaping Use: Never used  Substance and Sexual Activity  . Alcohol use: Yes    Alcohol/week: 6.0 standard drinks    Types: 6 Shots of liquor per week  . Drug use: Never  . Sexual activity: Not on file  Other Topics Concern  . Not on file  Social History Narrative  . Not on file   Social Determinants of Health   Financial Resource Strain: Low Risk   . Difficulty of Paying Living Expenses: Not hard at all  Food Insecurity: No Food Insecurity  . Worried About Charity fundraiser in the Last Year: Never true  . Ran Out of Food in the Last Year: Never true  Transportation Needs: No Transportation Needs  . Lack of Transportation (Medical): No  . Lack of Transportation (Non-Medical): No  Physical Activity: Sufficiently Active  . Days of Exercise per Week: 7 days  . Minutes of Exercise per Session: 60 min  Stress: No Stress Concern Present  . Feeling of  Stress : Not at all  Social Connections: Moderately Isolated  . Frequency of Communication with Friends and Family: More than three times a week  . Frequency of Social Gatherings with Friends and Family: More than three times a week  . Attends Religious Services: Never  . Active Member of Clubs or Organizations: No  . Attends Archivist Meetings: Never  . Marital Status: Married    Tobacco Counseling Counseling given: Not Answered   Clinical Intake:  Pre-visit preparation completed: Yes  Pain : No/denies pain     Nutritional Status: BMI 25 -29 Overweight Nutritional Risks: None Diabetes: No  How often do you need to have someone help you when you read instructions, pamphlets, or other written materials from your doctor or pharmacy?: 1 - Never What is the last grade level you completed in school?: 2 yrs of college  Diabetic?No  Interpreter Needed?: No  Information entered by :: Caroleen Hamman LPN   Activities of Daily Living In your present state of health, do you have any difficulty performing the following activities: 06/04/2020 09/18/2019  Hearing? N N  Vision? N N  Difficulty concentrating or making decisions? N N  Walking or climbing stairs? N N  Dressing or bathing? N N  Doing errands, shopping? N N  Preparing Food and eating ? N -  Using the Toilet? N -  In the past six months, have you accidently leaked urine? N -  Do you have problems with loss of bowel control? N -  Managing your Medications? N -  Managing your Finances? N -  Housekeeping or managing your Housekeeping? N -  Some recent data might be hidden    Patient Care Team: Libby Maw, MD as PCP - General (Family Medicine) Lorretta Harp, MD as PCP - Cardiology (Cardiology)  Indicate any recent Medical Services you may have received from other than [facility] providers in the past year (date may be approximate).     Assessment:   This is a routine wellness examination for  [place].  Hearing/Vision screen  Hearing Screening   125Hz  250Hz  500Hz  1000Hz  2000Hz  3000Hz  4000Hz  6000Hz  8000Hz   Right ear:           Left ear:           Comments: No issues  Vision Screening Comments: Reading glasses Last eye exam-2018  Dietary issues and exercise activities discussed: Current Exercise Habits: Home exercise routine, Type  of exercise: strength training/weights;walking, Time (Minutes): 60, Frequency (Times/Week): 7, Weekly Exercise (Minutes/Week): 420  Goals    . Blood Pressure < 130/80    . Patient Stated     Eat healthier & continue walking      Depression Screen PHQ 2/9 Scores 06/04/2020 07/17/2018 07/17/2018  PHQ - 2 Score 0 0 0    Fall Risk Fall Risk  06/04/2020 08/14/2017  Falls in the past year? 0 No  Number falls in past yr: 0 -  Injury with Fall? 0 -  Follow up Falls prevention discussed -    Any stairs in or around the home? Yes  If so, are there any without handrails? No  Home free of loose throw rugs in walkways, pet beds, electrical cords, etc? Yes  Adequate lighting in your home to reduce risk of falls? Yes   ASSISTIVE DEVICES UTILIZED TO PREVENT FALLS:  Life alert? No  Use of a cane, walker or w/c? No  Grab bars in the bathroom? No  Shower chair or bench in shower? No  Elevated toilet seat or a handicapped toilet? No   TIMED UP AND GO:  Was the test performed? No . Phone visit   Cognitive Function: No cognitive impairment noted. Patient plays scrabble & other games for brain health.        Immunizations Immunization History  Administered Date(s) Administered  . Fluad Quad(high Dose 65+) 08/27/2019  . PFIZER SARS-COV-2 Vaccination 01/06/2020, 01/27/2020  . Pneumococcal Conjugate-13 07/17/2018  . Tdap 09/21/2016    TDAP status: Up to date   Flu Vaccine status: Up to date   Pneumococcal vaccine status:Pneumovax-23 due- Discuss with PCP at next office visit.  Covid-19 vaccine status: Completed vaccines  Qualifies for  Shingles Vaccine? Yes   Zostavax completed No   Shingrix Completed?: No.    Education has been provided regarding the importance of this vaccine. Patient has been advised to call insurance company to determine out of pocket expense if they have not yet received this vaccine. Advised may also receive vaccine at local pharmacy or Health Dept. Verbalized acceptance and understanding.  Screening Tests Health Maintenance  Topic Date Due  . Hepatitis C Screening  Never done  . PNA vac Low Risk Adult (2 of 2 - PPSV23) 07/18/2019  . COLONOSCOPY  05/08/2023 (Originally 05/22/2003)  . INFLUENZA VACCINE  06/07/2020  . TETANUS/TDAP  09/21/2026  . COVID-19 Vaccine  Completed    Health Maintenance  Health Maintenance Due  Topic Date Due  . Hepatitis C Screening  Never done  . PNA vac Low Risk Adult (2 of 2 - PPSV23) 07/18/2019    Colorectal cancer screening: Completed 2014. Repeat every 10 years Patient states he had a colonoscopy in 2014 in [place], [place]. He states it was normal.  Lung Cancer Screening: (Low Dose CT Chest recommended if Age 60-80 years, 30 pack-year currently smoking OR have quit w/in 15years.) does not qualify.     Additional Screening:  Hepatitis C Screening: does qualify; Discuss with PCP  Vision Screening: Recommended annual ophthalmology exams for early detection of glaucoma and other disorders of the eye. Is the patient up to date with their annual eye exam?  No  Patient is aware that he is overdue for an eye exam & was advised to have yearly eye exams. He will make his own appt & will call the office if a referral is needed.  Dental Screening: Recommended annual dental exams for proper oral hygiene  Community Resource Referral /  Chronic Care Management: CRR required this visit?  No   CCM required this visit?  No      Plan:     I have personally reviewed and noted the following in the patient's chart:   . Medical and social history . Use of alcohol,  tobacco or illicit drugs  . Current medications and supplements . Functional ability and status . Nutritional status . Physical activity . Advanced directives . List of other physicians . Hospitalizations, surgeries, and ER visits in previous 12 months . Vitals . Screenings to include cognitive, depression, and falls . Referrals and appointments  In addition, I have reviewed and discussed with patient certain preventive protocols, quality metrics, and best practice recommendations. A written personalized care plan for preventive services as well as general preventive health recommendations were provided to patient.  Due to this being a telephonic visit, the after visit summary with patients personalized plan was offered to patient via mail or my-chart.  Patient would like to access on my-chart.   Marta Antu, LPN   5/68/1275  Nurse Health Advisor  Nurse Notes: None

## 2020-06-04 NOTE — Patient Instructions (Signed)
Jeffery Thomas , Thank you for taking time to come for your Medicare Wellness Visit. I appreciate your ongoing commitment to your health goals. Please review the following plan we discussed and let me know if I can assist you in the future.   Screening recommendations/referrals: Colonoscopy: Completed 2014-per our conversation. Due 2024 Recommended yearly ophthalmology/optometry visit for glaucoma screening and checkup Recommended yearly dental visit for hygiene and checkup  Vaccinations: Influenza vaccine: Up to Date-Due-07/2020 Pneumococcal vaccine: Completed Prevnar-13- Discuss Pneumovax -23 with PCP at next office visit Tdap vaccine: Up to Date-Due11/15/2027 Shingles vaccine: Discuss with pharmacy   Covid-19: Completed vaccines  Advanced directives: Please bring a copy of your Living Will to your next office visit.  Conditions/risks identified: See problem list  Next appointment: Follow up in one year for your annual wellness visit.  06/10/2021 @ 9:00am  Preventive Care 65 Years and Older, Male Preventive care refers to lifestyle choices and visits with your health care provider that can promote health and wellness. What does preventive care include?  A yearly physical exam. This is also called an annual well check.  Dental exams once or twice a year.  Routine eye exams. Ask your health care provider how often you should have your eyes checked.  Personal lifestyle choices, including:  Daily care of your teeth and gums.  Regular physical activity.  Eating a healthy diet.  Avoiding tobacco and drug use.  Limiting alcohol use.  Practicing safe sex.  Taking low doses of aspirin every day.  Taking vitamin and mineral supplements as recommended by your health care provider. What happens during an annual well check? The services and screenings done by your health care provider during your annual well check will depend on your age, overall health, lifestyle risk factors, and  family history of disease. Counseling  Your health care provider may ask you questions about your:  Alcohol use.  Tobacco use.  Drug use.  Emotional well-being.  Home and relationship well-being.  Sexual activity.  Eating habits.  History of falls.  Memory and ability to understand (cognition).  Work and work Statistician. Screening  You may have the following tests or measurements:  Height, weight, and BMI.  Blood pressure.  Lipid and cholesterol levels. These may be checked every 5 years, or more frequently if you are over 54 years old.  Skin check.  Lung cancer screening. You may have this screening every year starting at age 79 if you have a 30-pack-year history of smoking and currently smoke or have quit within the past 15 years.  Fecal occult blood test (FOBT) of the stool. You may have this test every year starting at age 67.  Flexible sigmoidoscopy or colonoscopy. You may have a sigmoidoscopy every 5 years or a colonoscopy every 10 years starting at age 20.  Prostate cancer screening. Recommendations will vary depending on your family history and other risks.  Hepatitis C blood test.  Hepatitis B blood test.  Sexually transmitted disease (STD) testing.  Diabetes screening. This is done by checking your blood sugar (glucose) after you have not eaten for a while (fasting). You may have this done every 1-3 years.  Abdominal aortic aneurysm (AAA) screening. You may need this if you are a current or former smoker.  Osteoporosis. You may be screened starting at age 58 if you are at high risk. Talk with your health care provider about your test results, treatment options, and if necessary, the need for more tests. Vaccines  Your health care  provider may recommend certain vaccines, such as:  Influenza vaccine. This is recommended every year.  Tetanus, diphtheria, and acellular pertussis (Tdap, Td) vaccine. You may need a Td booster every 10 years.  Zoster  vaccine. You may need this after age 67.  Pneumococcal 13-valent conjugate (PCV13) vaccine. One dose is recommended after age 17.  Pneumococcal polysaccharide (PPSV23) vaccine. One dose is recommended after age 67. Talk to your health care provider about which screenings and vaccines you need and how often you need them. This information is not intended to replace advice given to you by your health care provider. Make sure you discuss any questions you have with your health care provider. Document Released: 11/20/2015 Document Revised: 07/13/2016 Document Reviewed: 08/25/2015 Elsevier Interactive Patient Education  2017 [place] Prevention in the Home Falls can cause injuries. They can happen to people of all ages. There are many things you can do to make your home safe and to help prevent falls. What can I do on the outside of my home?  Regularly fix the edges of walkways and driveways and fix any cracks.  Remove anything that might make you trip as you walk through a door, such as a raised step or threshold.  Trim any bushes or trees on the path to your home.  Use bright outdoor lighting.  Clear any walking paths of anything that might make someone trip, such as rocks or tools.  Regularly check to see if handrails are loose or broken. Make sure that both sides of any steps have handrails.  Any raised decks and porches should have guardrails on the edges.  Have any leaves, snow, or ice cleared regularly.  Use sand or salt on walking paths during winter.  Clean up any spills in your garage right away. This includes oil or grease spills. What can I do in the bathroom?  Use night lights.  Install grab bars by the toilet and in the tub and shower. Do not use towel bars as grab bars.  Use non-skid mats or decals in the tub or shower.  If you need to sit down in the shower, use a plastic, non-slip stool.  Keep the floor dry. Clean up any water that spills on the  floor as soon as it happens.  Remove soap buildup in the tub or shower regularly.  Attach bath mats securely with double-sided non-slip rug tape.  Do not have throw rugs and other things on the floor that can make you trip. What can I do in the bedroom?  Use night lights.  Make sure that you have a light by your bed that is easy to reach.  Do not use any sheets or blankets that are too big for your bed. They should not hang down onto the floor.  Have a firm chair that has side arms. You can use this for support while you get dressed.  Do not have throw rugs and other things on the floor that can make you trip. What can I do in the kitchen?  Clean up any spills right away.  Avoid walking on wet floors.  Keep items that you use a lot in easy-to-reach places.  If you need to reach something above you, use a strong step stool that has a grab bar.  Keep electrical cords out of the way.  Do not use floor polish or wax that makes floors slippery. If you must use wax, use non-skid floor wax.  Do not have throw  rugs and other things on the floor that can make you trip. What can I do with my stairs?  Do not leave any items on the stairs.  Make sure that there are handrails on both sides of the stairs and use them. Fix handrails that are broken or loose. Make sure that handrails are as [place] as the stairways.  Check any carpeting to make sure that it is firmly attached to the stairs. Fix any carpet that is loose or worn.  Avoid having throw rugs at the top or bottom of the stairs. If you do have throw rugs, attach them to the floor with carpet tape.  Make sure that you have a light switch at the top of the stairs and the bottom of the stairs. If you do not have them, ask someone to add them for you. What else can I do to help prevent falls?  Wear shoes that:  Do not have high heels.  Have rubber bottoms.  Are comfortable and fit you well.  Are closed at the toe. Do not wear  sandals.  If you use a stepladder:  Make sure that it is fully opened. Do not climb a closed stepladder.  Make sure that both sides of the stepladder are locked into place.  Ask someone to hold it for you, if possible.  Clearly mark and make sure that you can see:  Any grab bars or handrails.  First and last steps.  Where the edge of each step is.  Use tools that help you move around (mobility aids) if they are needed. These include:  Canes.  Walkers.  Scooters.  Crutches.  Turn on the lights when you go into a dark area. Replace any light bulbs as soon as they burn out.  Set up your furniture so you have a clear path. Avoid moving your furniture around.  If any of your floors are uneven, fix them.  If there are any pets around you, be aware of where they are.  Review your medicines with your doctor. Some medicines can make you feel dizzy. This can increase your chance of falling. Ask your doctor what other things that you can do to help prevent falls. This information is not intended to replace advice given to you by your health care provider. Make sure you discuss any questions you have with your health care provider. Document Released: 08/20/2009 Document Revised: 03/31/2016 Document Reviewed: 11/28/2014 Elsevier Interactive Patient Education  2017 [employer].

## 2020-06-05 ENCOUNTER — Other Ambulatory Visit: Payer: Self-pay | Admitting: Family Medicine

## 2020-06-05 ENCOUNTER — Other Ambulatory Visit: Payer: Self-pay | Admitting: Internal Medicine

## 2020-06-05 DIAGNOSIS — K219 Gastro-esophageal reflux disease without esophagitis: Secondary | ICD-10-CM

## 2020-06-05 DIAGNOSIS — Z95810 Presence of automatic (implantable) cardiac defibrillator: Secondary | ICD-10-CM

## 2020-06-05 DIAGNOSIS — I428 Other cardiomyopathies: Secondary | ICD-10-CM | POA: Diagnosis not present

## 2020-06-05 NOTE — Progress Notes (Signed)
EPIC Encounter for ICM Monitoring  Patient Name: Jeffery Thomas is a 69 y.o. male Date: 06/05/2020 Primary Care Physican: Libby Maw, MD Primary Cardiologist:Berry Electrophysiologist:Allred 7/30/2021Weight:215lbs   Spoke with patient.  He said he feels great but is not strict on salt intake.  Weight increase of 10 lbs in last 2-3 weeks.  Patient attempted to send updated transmission but monitor is not working.  He will call tech service number and send updated report on 8/2  CorvueThoracic impedancesuggesting possible fluid accumulation since 05/22/2020 and is returning close to baseline.   Prescribed: Spironolactone 25 mg take 1 tablet daily  Labs: 09/19/2019 Creatinine 0.91, BUN 20, Potassium 4.3, Sodium 131, GFR >60 09/12/2019 Creatinine 0.92, BUN 21, Potassium 4.8, Sodium 139, GFR >60 05/02/2019 Creatinine 1.02, BUN 16, Potassium 4.6, Sodium 136, GFR >60 A complete set of results can be found in Results Review.  Recommendations: No changes.  Follow-up plan: ICM clinic phone appointment on8/12/2019 (manual send) to recheck fluid levels. 91 day device clinic remote transmission8/30/2021.     Copy of ICM check sent to Dr. Rayann Heman and Dr Gwenlyn Found.   3 month ICM trend: 05/30/2020    1 Year ICM trend:       Rosalene Billings, RN 06/05/2020 4:16 PM

## 2020-06-08 ENCOUNTER — Ambulatory Visit (INDEPENDENT_AMBULATORY_CARE_PROVIDER_SITE_OTHER): Payer: Medicare Other

## 2020-06-08 DIAGNOSIS — Z95810 Presence of automatic (implantable) cardiac defibrillator: Secondary | ICD-10-CM

## 2020-06-08 DIAGNOSIS — I428 Other cardiomyopathies: Secondary | ICD-10-CM

## 2020-06-09 ENCOUNTER — Telehealth: Payer: Self-pay

## 2020-06-09 NOTE — Progress Notes (Signed)
EPIC Encounter for ICM Monitoring  Patient Name: LENY MOROZOV is a 69 y.o. male Date: 06/09/2020 Primary Care Physican: Libby Maw, MD Primary Cardiologist:Berry Electrophysiologist:Allred 7/30/2021Weight:215lbs  06/09/2020 Weight: 215 lbs  Spoke with patient.  Weight is a little higher than normal but he feels fine.  CorvueThoracic impedancereturned to baseline normal.   Prescribed: Spironolactone 25 mg take 1 tablet daily  Labs: 09/19/2019 Creatinine 0.91, BUN 20, Potassium 4.3, Sodium 131, GFR >60 09/12/2019 Creatinine 0.92, BUN 21, Potassium 4.8, Sodium 139, GFR >60 05/02/2019 Creatinine 1.02, BUN 16, Potassium 4.6, Sodium 136, GFR >60 A complete set of results can be found in Results Review.  Recommendations: No changes and encouraged to call if experiencing any fluid symptoms.  Follow-up plan: ICM clinic phone appointment on9/12/2019. 91 day device clinic remote transmission9/11/2019.     EP/Cardiology Office Visits: Recalls for 09/02/2020 with Dr. Gwenlyn Found and 02/20/2021 with Dr Rayann Heman.    Copy of ICM check sent to Dr. Rayann Heman.   3 month ICM trend: 06/09/2020    1 Year ICM trend:       Rosalene Billings, RN 06/09/2020 2:46 PM

## 2020-06-09 NOTE — Telephone Encounter (Signed)
Returned patient call.  He attempted to send manual remote transmission and advised it was not received.  He said he had to leave the beside monitor before it was completed and advised to remain by monitor until transmission is sent.  He said he will try to send another one later today.

## 2020-07-08 ENCOUNTER — Ambulatory Visit (INDEPENDENT_AMBULATORY_CARE_PROVIDER_SITE_OTHER): Payer: Medicare Other | Admitting: *Deleted

## 2020-07-08 DIAGNOSIS — I428 Other cardiomyopathies: Secondary | ICD-10-CM | POA: Diagnosis not present

## 2020-07-10 ENCOUNTER — Ambulatory Visit (INDEPENDENT_AMBULATORY_CARE_PROVIDER_SITE_OTHER): Payer: Medicare Other

## 2020-07-10 DIAGNOSIS — I428 Other cardiomyopathies: Secondary | ICD-10-CM | POA: Diagnosis not present

## 2020-07-10 DIAGNOSIS — Z95810 Presence of automatic (implantable) cardiac defibrillator: Secondary | ICD-10-CM

## 2020-07-10 LAB — CUP PACEART REMOTE DEVICE CHECK
Battery Remaining Longevity: 88 mo
Battery Remaining Percentage: 84 %
Battery Voltage: 3.01 V
Brady Statistic RV Percent Paced: 1 %
Date Time Interrogation Session: 20210901023416
HighPow Impedance: 79 Ohm
HighPow Impedance: 79 Ohm
Implantable Lead Implant Date: 20180828
Implantable Lead Location: 753860
Implantable Pulse Generator Implant Date: 20190915
Lead Channel Impedance Value: 390 Ohm
Lead Channel Pacing Threshold Amplitude: 0.5 V
Lead Channel Pacing Threshold Pulse Width: 0.5 ms
Lead Channel Sensing Intrinsic Amplitude: 11.9 mV
Lead Channel Setting Pacing Amplitude: 2.5 V
Lead Channel Setting Pacing Pulse Width: 0.5 ms
Lead Channel Setting Sensing Sensitivity: 0.5 mV
Pulse Gen Serial Number: 9851932

## 2020-07-10 NOTE — Progress Notes (Signed)
EPIC Encounter for ICM Monitoring  Patient Name: Jeffery Thomas is a 69 y.o. male Date: 07/10/2020 Primary Care Physican: Libby Maw, MD Primary Cardiologist:Berry Electrophysiologist:Allred 06/09/2020 Weight: 215 lbs 07/10/2020 Weight: 220 lbs  Spoke with patient and reports weight gain of 5 pounds in the last 7-10 days.  His fluid intake is > 160 oz daily and eating foods in salt such as ham, bacon, and large amount of salted nuts.  Discussed the need to limiting salt intake to 2000 mg daily and fluid intake to 64 oz daily.  CorvueThoracic impedancesuggesting possible fluid accumulation since 07/05/2020.   Prescribed: Spironolactone 25 mg take 1 tablet daily     Pt reports taking Meloxicam daily which was prescribed for his hip pain prior to hip replacement surgery.  He has continued to take it daily but states he does not need it.  Advised to call Dr Toma Copier the prescribing physician to check if he should continue to take since NSAIDS there is a risk for cardiac patients to take on a regular basis.  He agreed to call him today and if does not need it then he will get the approval to stop the medication.   Labs: 09/19/2019 Creatinine 0.91, BUN 20, Potassium 4.3, Sodium 131, GFR >60 09/12/2019 Creatinine 0.92, BUN 21, Potassium 4.8, Sodium 139, GFR >60 05/02/2019 Creatinine 1.02, BUN 16, Potassium 4.6, Sodium 136, GFR >60 A complete set of results can be found in Results Review.  Recommendations: Recommendation to limit salt intake to 2000 mg daily and fluid intake to 64 oz daily.    Follow-up plan: ICM clinic phone appointment on9/06/2020 (manual) to recheck fluid levels. 91 day device clinic remote transmission12/11/2019.  EP/Cardiology Office Visits: Recalls for 09/02/2020 with Dr. Gwenlyn Found and 02/20/2021 with Dr Rayann Heman.    Copy of ICM check sent to Dr. Rayann Heman and Dr Gwenlyn Found for review.   3 month ICM trend:  07/09/2020    1 Year ICM trend:       Rosalene Billings, RN 07/10/2020 9:57 AM

## 2020-07-10 NOTE — Progress Notes (Signed)
Remote ICD transmission.   

## 2020-07-14 ENCOUNTER — Other Ambulatory Visit: Payer: Self-pay | Admitting: Cardiovascular Disease

## 2020-07-15 ENCOUNTER — Ambulatory Visit (INDEPENDENT_AMBULATORY_CARE_PROVIDER_SITE_OTHER): Payer: Medicare Other

## 2020-07-15 DIAGNOSIS — I428 Other cardiomyopathies: Secondary | ICD-10-CM

## 2020-07-15 DIAGNOSIS — Z95810 Presence of automatic (implantable) cardiac defibrillator: Secondary | ICD-10-CM

## 2020-07-15 NOTE — Progress Notes (Signed)
EPIC Encounter for ICM Monitoring  Patient Name: Jeffery Thomas is a 69 y.o. male Date: 07/15/2020 Primary Care Physican: Libby Maw, MD Primary Cardiologist:Berry Electrophysiologist:Allred 07/15/2020 Weight: 214 lbs  Spoke with patient and reports weight loss of 6 lbs.  He has adjusted his diet and decreased salt intake.   CorvueThoracic impedance suggesting fluid levels have returned to normal.   Prescribed: Spironolactone 25 mg take 1 tablet daily     Patient spoke with Dr Toma Copier and Meloxicam has been discontinued.  Labs: 09/19/2019 Creatinine 0.91, BUN 20, Potassium 4.3, Sodium 131, GFR >60 09/12/2019 Creatinine 0.92, BUN 21, Potassium 4.8, Sodium 139, GFR >60 05/02/2019 Creatinine 1.02, BUN 16, Potassium 4.6, Sodium 136, GFR >60 A complete set of results can be found in Results Review.  Recommendations: Recommendation to limit salt intake to 2000 mg daily and fluid intake to 64 oz daily.    Follow-up plan: ICM clinic phone appointment on10/02/2020. 91 day device clinic remote transmission12/11/2019.  EP/Cardiology Office Visits:Recalls for 09/02/2020 with Dr.Berry and 02/20/2021 with Dr Rayann Heman.   Copy of ICM check sent to Dr.Allred and Dr Gwenlyn Found for [facility] that fluid levels appear normal.   3 month ICM trend: 07/15/2020    1 Year ICM trend:       Rosalene Billings, RN 07/15/2020 4:15 PM

## 2020-07-30 ENCOUNTER — Telehealth: Payer: Self-pay | Admitting: *Deleted

## 2020-07-30 ENCOUNTER — Telehealth: Payer: Self-pay | Admitting: Cardiovascular Disease

## 2020-07-30 NOTE — Telephone Encounter (Signed)
A message was left, re: his follow up visit. 

## 2020-07-30 NOTE — Telephone Encounter (Signed)
     I went in pt chart to see who called him. It was for an appointment, I made his appointment.

## 2020-08-10 ENCOUNTER — Telehealth: Payer: Self-pay

## 2020-08-10 ENCOUNTER — Ambulatory Visit (INDEPENDENT_AMBULATORY_CARE_PROVIDER_SITE_OTHER): Payer: Medicare Other

## 2020-08-10 DIAGNOSIS — I428 Other cardiomyopathies: Secondary | ICD-10-CM

## 2020-08-10 DIAGNOSIS — Z95810 Presence of automatic (implantable) cardiac defibrillator: Secondary | ICD-10-CM | POA: Diagnosis not present

## 2020-08-10 NOTE — Telephone Encounter (Signed)
Remote ICM transmission received.  Attempted call to patient regarding ICM remote transmission and left detailed message per DPR.  Advised to return call for any fluid symptoms or questions.  

## 2020-08-10 NOTE — Progress Notes (Signed)
EPIC Encounter for ICM Monitoring  Patient Name: STCLAIR SZYMBORSKI is a 69 y.o. male Date: 08/10/2020 Primary Care Physican: Libby Maw, MD Primary Cardiologist:Berry Electrophysiologist:Allred 07/15/2020 Weight: 214 lbs  Attempted call to patient and unable to reach.  Left detailed message per DPR regarding transmission. Transmission reviewed.   CorvueThoracic impedance suggesting possible fluid accumulation 9/18-9/24, 9/26-10/1 and starting 08/08/2020  Prescribed: Spironolactone 25 mg take 1 tablet daily  Labs: 09/19/2019 Creatinine 0.91, BUN 20, Potassium 4.3, Sodium 131, GFR >60 09/12/2019 Creatinine 0.92, BUN 21, Potassium 4.8, Sodium 139, GFR >60 05/02/2019 Creatinine 1.02, BUN 16, Potassium 4.6, Sodium 136, GFR >60 A complete set of results can be found in Results Review.  Recommendations: Left voice mail with ICM number and encouraged to call if experiencing any fluid symptoms.  Follow-up plan: ICM clinic phone appointment on10/10/2020 to recheck fluid levels. 91 day device clinic remote transmission12/11/2019.  EP/Cardiology Office Visits:09/23/2020 with Dr.Berry.  Recall 02/20/2021 with Dr Rayann Heman.   Copy of ICM check sent to Dr.Allred and Dr Gwenlyn Found.    3 month ICM trend: 08/10/2020    1 Year ICM trend:       Rosalene Billings, RN 08/10/2020 12:23 PM

## 2020-08-11 NOTE — Progress Notes (Signed)
Pt left voice mail message stating he was in [facility] on vacation during decreased impedance  He said he was not following fluid or salt limit during that time.  He feels fine and weight is 205 lbs.  He is getting back on his diet since he has returned home.

## 2020-08-18 NOTE — Progress Notes (Signed)
No ICM remote transmission received for 08/18/2020 and next ICM transmission scheduled for 09/14/2020.

## 2020-08-19 ENCOUNTER — Other Ambulatory Visit: Payer: Self-pay | Admitting: Internal Medicine

## 2020-09-18 ENCOUNTER — Ambulatory Visit: Payer: Medicare Other | Admitting: Cardiovascular Disease

## 2020-09-18 NOTE — Progress Notes (Unsigned)
No ICM remote transmission received for 09/14/2020 and next ICM transmission scheduled for 10/08/2020.

## 2020-09-23 ENCOUNTER — Encounter: Payer: Self-pay | Admitting: Cardiovascular Disease

## 2020-09-23 ENCOUNTER — Ambulatory Visit (INDEPENDENT_AMBULATORY_CARE_PROVIDER_SITE_OTHER): Payer: Medicare Other | Admitting: Cardiovascular Disease

## 2020-09-23 ENCOUNTER — Other Ambulatory Visit: Payer: Self-pay

## 2020-09-23 VITALS — BP 130/88 | HR 54 | Ht 72.0 in | Wt 206.0 lb

## 2020-09-23 DIAGNOSIS — I10 Essential (primary) hypertension: Secondary | ICD-10-CM

## 2020-09-23 DIAGNOSIS — I5021 Acute systolic (congestive) heart failure: Secondary | ICD-10-CM

## 2020-09-23 DIAGNOSIS — I4901 Ventricular fibrillation: Secondary | ICD-10-CM

## 2020-09-23 DIAGNOSIS — I213 ST elevation (STEMI) myocardial infarction of unspecified site: Secondary | ICD-10-CM

## 2020-09-23 DIAGNOSIS — I469 Cardiac arrest, cause unspecified: Secondary | ICD-10-CM

## 2020-09-23 DIAGNOSIS — E782 Mixed hyperlipidemia: Secondary | ICD-10-CM

## 2020-09-23 LAB — HEPATIC FUNCTION PANEL
ALT: 15 IU/L (ref 0–44)
AST: 11 IU/L (ref 0–40)
Albumin: 4.6 g/dL (ref 3.8–4.8)
Alkaline Phosphatase: 83 IU/L (ref 44–121)
Bilirubin Total: 0.7 mg/dL (ref 0.0–1.2)
Bilirubin, Direct: 0.19 mg/dL (ref 0.00–0.40)
Total Protein: 6.9 g/dL (ref 6.0–8.5)

## 2020-09-23 LAB — LIPID PANEL
Chol/HDL Ratio: 3.8 ratio (ref 0.0–5.0)
Cholesterol, Total: 145 mg/dL (ref 100–199)
HDL: 38 mg/dL — ABNORMAL LOW (ref >39)
LDL Chol Calc (NIH): 88 mg/dL (ref 0–99)
Triglycerides: 101 mg/dL (ref 0–149)
VLDL Cholesterol Cal: 19 mg/dL (ref 5–40)

## 2020-09-23 MED ORDER — CARVEDILOL 12.5 MG PO TABS: 12.5000 mg | ORAL_TABLET | Freq: Two times a day (BID) | ORAL | 3 refills | Status: DC

## 2020-09-23 NOTE — Patient Instructions (Signed)
Medication Instructions:  Carvedilol 12.5mg  twice daily new prescription sent.  *If you need a refill on your cardiac medications before your next appointment, please call your pharmacy*   Lab Work: Your physician recommends that you have labs today: lipid and liver profile.  If you have labs (blood work) drawn today and your tests are completely normal, you will receive your results only by:  [place] (if you have MyChart) OR  A paper copy in the mail If you have any lab test that is abnormal or we need to change your treatment, we will call you to review the results.      Follow-Up: At [facility], you and your health needs are our priority.  As part of our continuing mission to provide you with exceptional heart care, we have created designated Provider Care Teams.  These Care Teams include your primary Cardiologist (physician) and Advanced Practice Providers (APPs -  Physician Assistants and Nurse Practitioners) who all work together to provide you with the care you need, when you need it.  We recommend signing up for the patient portal called "MyChart".  Sign up information is provided on this After Visit Summary.  MyChart is used to connect with patients for Virtual Visits (Telemedicine).  Patients are able to view lab/test results, encounter notes, upcoming appointments, etc.  Non-urgent messages can be sent to your provider as well.   To learn more about what you can do with MyChart, go to [email or web address].    Your next appointment:   12 month(s)  The format for your next appointment:   In Person  Provider:   Quay Burow, MD

## 2020-09-23 NOTE — Assessment & Plan Note (Signed)
Mr. Florina Ou had witnessed sudden cardiac death on the evening of 2017-06-27 with fairly immediate CPR by EMS and ROSC of 20 minutes.  I performed urgent cardiac catheterization on him revealing normal coronary arteries with severe LV dysfunction consistent with Takatsubo syndrome.  8 days later he had an ICD implanted by Dr. Rayann Heman for secondary prevention.  He is never had a discharge.  His device was explanted because of early battery depletion by Dr. Lovena Le with a new device installed 07/20/2018.

## 2020-09-23 NOTE — Assessment & Plan Note (Signed)
History of hyperlipidemia on statin therapy with lipid profile performed 07/17/2018 revealing total cholesterol 118, LDL 65 and HDL of 37.  We will repeat a fasting lipid liver profile this morning.

## 2020-09-23 NOTE — Progress Notes (Signed)
09/23/2020 Jeffery Thomas   [birth year]  [id number]  Primary Physician Libby Maw, MD Primary Cardiologist: Lorretta Harp MD Jeffery Thomas, [place]  HPI:  Jeffery Thomas is a 69 y.o.  married, father of one with no grandchildren who has not worked the last 19 yearsand isaccompanied by his wife Jeffery Thomas works for the [employer] raising. I last saw him in the office  09/03/2019.He had witnessed cardiac death on the evening of 07-09-2017. He had fairly immediate CPR after the EMS was called and was shocked 3 times. He had CPR and had R OSC in 20 minutes. He is transported to [facility] where I performed urgent cardiac catheterization revealing normal coronary arteries and severe LV dysfunction consistent withTakatsubosyndrome. 8 days later he had an ICD implanted by Dr. Rayann Heman for secondary prevention. He was discharged home from the hospital 3 weeks ago. Prior to his presentation he did have a history of treated hypertension and hyperlipidemia. His mother died of a myocardial infarction at age 80. He does smoke cigarettes or playing golf and prior to this drank several hard drinks of liquor a night. Since that time his drinking and has been markedly limited. He has no recollection of the event or one week prior to the event. He has no neurologic deficits otherwise.   He has had an ICD implanted by Dr. Rayann Heman September 2018 for secondary prevention. 2-D echo performed 10/09/17 showed complete normalization of LV function.  He had his ICD generator explanted because of early battery depletion by Dr. Lovena Le 07/20/2018 with a new device installed.  Is followed by Dr. Rayann Heman as an outpatient.  He is not had an ICD discharge.  He denies chest pain or shortness of breath.  He does occasionally smoke a cigar when playing golf.  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 Wynelle Link.  Since I saw him 12 months ago he continues to do well.  He has  lost 30 pounds since September 6 and walks 60 minutes / 3 miles a day and has adjusted his diet.  He says he feels the best that he is felt in years.  He has not had an ICD discharge, he denies chest pain or shortness of breath.   Current Meds  Medication Sig  . amoxicillin (AMOXIL) 500 MG tablet amoxicillin 500 mg tablet  4 tablets 1 hour prior to dental procedure  . atorvastatin (LIPITOR) 20 MG tablet TAKE 1 TABLET DAILY  . carvedilol (COREG) 12.5 MG tablet Take 1 tablet (12.5 mg total) by mouth 2 (two) times daily with a meal.  . cholecalciferol (VITAMIN D3) 25 MCG (1000 UNIT) tablet Take 1,000 Units by mouth daily.  Marland Kitchen HYDROcodone-acetaminophen (NORCO/VICODIN) 5-325 MG tablet Take 1-2 tablets by mouth every 6 (six) hours as needed for severe pain.  Marland Kitchen losartan (COZAAR) 100 MG tablet TAKE 1 TABLET DAILY  . meloxicam (MOBIC) 15 MG tablet Take 1qd  . montelukast (SINGULAIR) 10 MG tablet Take 1 tablet (10 mg total) by mouth daily.  Marland Kitchen omeprazole (PRILOSEC) 40 MG capsule TAKE 1 CAPSULE DAILY  . spironolactone (ALDACTONE) 25 MG tablet TAKE 1 TABLET DAILY  . [DISCONTINUED] carvedilol (COREG) 25 MG tablet Take 0.5 tablets (12.5 mg total) by mouth 2 (two) times daily with a meal.     No Known Allergies  Social History   Socioeconomic History  . Marital status: Married    Spouse name: Not on file  . Number of  children: Not on file  . Years of education: Not on file  . Highest education level: Not on file  Occupational History  . Not on file  Tobacco Use  . Smoking status: Former Smoker    Types: Cigars    Quit date: 07-21-2017    Years since quitting: 3.2  . Smokeless tobacco: Never Used  Vaping Use  . Vaping Use: Never used  Substance and Sexual Activity  . Alcohol use: Yes    Alcohol/week: 6.0 standard drinks    Types: 6 Shots of liquor per week  . Drug use: Never  . Sexual activity: Not on file  Other Topics Concern  . Not on file  Social History Narrative  . Not on file    Social Determinants of Health   Financial Resource Strain: Low Risk   . Difficulty of Paying Living Expenses: Not hard at all  Food Insecurity: No Food Insecurity  . Worried About Charity fundraiser in the Last Year: Never true  . Ran Out of Food in the Last Year: Never true  Transportation Needs: No Transportation Needs  . Lack of Transportation (Medical): No  . Lack of Transportation (Non-Medical): No  Physical Activity: Sufficiently Active  . Days of Exercise per Week: 7 days  . Minutes of Exercise per Session: 60 min  Stress: No Stress Concern Present  . Feeling of Stress : Not at all  Social Connections: Moderately Isolated  . Frequency of Communication with Friends and Family: More than three times a week  . Frequency of Social Gatherings with Friends and Family: More than three times a week  . Attends Religious Services: Never  . Active Member of Clubs or Organizations: No  . Attends Archivist Meetings: Never  . Marital Status: Married  Human resources officer Violence: Not At Risk  . Fear of Current or Ex-Partner: No  . Emotionally Abused: No  . Physically Abused: No  . Sexually Abused: No     Review of Systems: General: negative for chills, fever, night sweats or weight changes.  Cardiovascular: negative for chest pain, dyspnea on exertion, edema, orthopnea, palpitations, paroxysmal nocturnal dyspnea or shortness of breath Dermatological: negative for rash Respiratory: negative for cough or wheezing Urologic: negative for hematuria Abdominal: negative for nausea, vomiting, diarrhea, bright red blood per rectum, melena, or hematemesis Neurologic: negative for visual changes, syncope, or dizziness All other systems reviewed and are otherwise negative except as noted above.    Blood pressure 130/88, pulse (!) 54, height 6' (1.829 m), weight 206 lb (93.4 kg).  General appearance: alert and no distress Neck: no adenopathy, no carotid bruit, no JVD, supple,  symmetrical, trachea midline and thyroid not enlarged, symmetric, no tenderness/mass/nodules Lungs: clear to auscultation bilaterally Heart: regular rate and rhythm, S1, S2 normal, no murmur, click, rub or gallop Extremities: extremities normal, atraumatic, no cyanosis or edema Pulses: 2+ and symmetric Skin: Skin color, texture, turgor normal. No rashes or lesions Neurologic: Alert and oriented X 3, normal strength and tone. Normal symmetric reflexes. Normal coordination and gait  EKG sinus bradycardia 54 without ST or T wave changes.  Personally reviewed this EKG.  ASSESSMENT AND PLAN:   Sudden cardiac death [facility]) Mr. Florina Ou had witnessed sudden cardiac death on the evening of 2017/07/21 with fairly immediate CPR by EMS and ROSC of 20 minutes.  I performed urgent cardiac catheterization on him revealing normal coronary arteries with severe LV dysfunction consistent with Takatsubo syndrome.  8 days later he had an  ICD implanted by Dr. Rayann Heman for secondary prevention.  He is never had a discharge.  His device was explanted because of early battery depletion by Dr. Lovena Le with a new device installed 07/20/2018.  Acute systolic congestive heart failure ([place]) His EF normalized by 2D echo 09/10/2019 with grade 1 diastolic dysfunction.  Benign essential HTN History of essential hypertension a blood pressure measured today 130/88.  He is on carvedilol and losartan, and spironolactone.  Hyperlipidemia History of hyperlipidemia on statin therapy with lipid profile performed 07/17/2018 revealing total cholesterol 118, LDL 65 and HDL of 37.  We will repeat a fasting lipid liver profile this morning.      Lorretta Harp MD FACP,FACC,FAHA, [facility] 09/23/2020 10:09 AM

## 2020-09-23 NOTE — Assessment & Plan Note (Signed)
His EF normalized by 2D echo 09/10/2019 with grade 1 diastolic dysfunction.

## 2020-09-23 NOTE — Assessment & Plan Note (Signed)
History of essential hypertension a blood pressure measured today 130/88.  He is on carvedilol and losartan, and spironolactone.

## 2020-10-05 ENCOUNTER — Other Ambulatory Visit: Payer: Self-pay | Admitting: Cardiovascular Disease

## 2020-10-07 ENCOUNTER — Ambulatory Visit (INDEPENDENT_AMBULATORY_CARE_PROVIDER_SITE_OTHER): Payer: Medicare Other

## 2020-10-07 DIAGNOSIS — I4901 Ventricular fibrillation: Secondary | ICD-10-CM | POA: Diagnosis not present

## 2020-10-07 LAB — CUP PACEART REMOTE DEVICE CHECK
Battery Remaining Longevity: 85 mo
Battery Remaining Percentage: 81 %
Battery Voltage: 2.99 V
Brady Statistic RV Percent Paced: 1 %
Date Time Interrogation Session: 20211201060442
HighPow Impedance: 79 Ohm
HighPow Impedance: 79 Ohm
Implantable Lead Implant Date: 20180828
Implantable Lead Location: 753860
Implantable Pulse Generator Implant Date: 20190915
Lead Channel Impedance Value: 380 Ohm
Lead Channel Pacing Threshold Amplitude: 0.5 V
Lead Channel Pacing Threshold Pulse Width: 0.5 ms
Lead Channel Sensing Intrinsic Amplitude: 11.9 mV
Lead Channel Setting Pacing Amplitude: 2.5 V
Lead Channel Setting Pacing Pulse Width: 0.5 ms
Lead Channel Setting Sensing Sensitivity: 0.5 mV
Pulse Gen Serial Number: 9851932

## 2020-10-08 ENCOUNTER — Ambulatory Visit (INDEPENDENT_AMBULATORY_CARE_PROVIDER_SITE_OTHER): Payer: Medicare Other

## 2020-10-08 DIAGNOSIS — I428 Other cardiomyopathies: Secondary | ICD-10-CM | POA: Diagnosis not present

## 2020-10-08 DIAGNOSIS — Z95810 Presence of automatic (implantable) cardiac defibrillator: Secondary | ICD-10-CM | POA: Diagnosis not present

## 2020-10-12 NOTE — Progress Notes (Signed)
EPIC Encounter for ICM Monitoring  Patient Name: EMANUELLE BASTOS is a 69 y.o. male Date: 10/12/2020 Primary Care Physican: Libby Maw, MD Primary Cardiologist:Berry Electrophysiologist:Allred 09/23/2020 Office Weight: 206lbs  Transmission reviewed.   CorvueThoracic impedancesuggesting possible fluid accumulation 11/23-12/2 but returned to baseline normal 10/09/20.  Prescribed: Spironolactone 25 mg take 1 tablet daily  Labs: 09/19/2019 Creatinine 0.91, BUN 20, Potassium 4.3, Sodium 131, GFR >60 09/12/2019 Creatinine 0.92, BUN 21, Potassium 4.8, Sodium 139, GFR >60 05/02/2019 Creatinine 1.02, BUN 16, Potassium 4.6, Sodium 136, GFR >60 A complete set of results can be found in Results Review.  Recommendations: No changes  Follow-up plan: ICM clinic phone appointment on1/01/2021. 91 day device clinic remote transmission3/12/2020.  EP/Cardiology Office Visits: Recall 02/20/2021 with Dr Rayann Heman.   Copy of ICM check sent to Dr.Allred  Direct Trend View through 10/12/2020.   3 month ICM trend: 10/08/2020    1 Year ICM trend:       Rosalene Billings, RN 10/12/2020 11:17 AM

## 2020-10-14 NOTE — Progress Notes (Signed)
Remote ICD transmission.   

## 2020-11-09 ENCOUNTER — Ambulatory Visit (INDEPENDENT_AMBULATORY_CARE_PROVIDER_SITE_OTHER): Payer: Medicare Other

## 2020-11-09 ENCOUNTER — Other Ambulatory Visit: Payer: Self-pay | Admitting: Cardiovascular Disease

## 2020-11-09 DIAGNOSIS — I428 Other cardiomyopathies: Secondary | ICD-10-CM

## 2020-11-09 DIAGNOSIS — Z95810 Presence of automatic (implantable) cardiac defibrillator: Secondary | ICD-10-CM | POA: Diagnosis not present

## 2020-11-11 NOTE — Progress Notes (Signed)
EPIC Encounter for ICM Monitoring  Patient Name: CLELL TRAHAN is a 70 y.o. male Date: 11/11/2020 Primary Care Physican: Mliss Sax, MD Primary Cardiologist:Berry Electrophysiologist:Allred 11/11/2020 Weight: 195lbs  Spoke with patient and reports feeling well at this time.  Denies fluid symptoms.    CorvueThoracic impedancesuggesting normal fluid levels.  Prescribed: Spironolactone 25 mg take 1 tablet daily  Labs: 09/19/2019 Creatinine 0.91, BUN 20, Potassium 4.3, Sodium 131, GFR >60 09/12/2019 Creatinine 0.92, BUN 21, Potassium 4.8, Sodium 139, GFR >60 05/02/2019 Creatinine 1.02, BUN 16, Potassium 4.6, Sodium 136, GFR >60 A complete set of results can be found in Results Review.  Recommendations: No changes and encouraged to call if experiencing any fluid symptoms.  Follow-up plan: ICM clinic phone appointment on2/06/2021. 91 day device clinic remote transmission3/12/2020.  EP/Cardiology Office Visits:Recall 02/20/2021 with Dr Johney Frame.   Copy of ICM check sent to Dr.Allred  3 month ICM trend: 11/10/2020.    1 Year ICM trend:       Karie Soda, RN 11/11/2020 2:40 PM

## 2020-12-15 ENCOUNTER — Ambulatory Visit (INDEPENDENT_AMBULATORY_CARE_PROVIDER_SITE_OTHER): Payer: Medicare Other

## 2020-12-15 DIAGNOSIS — Z95810 Presence of automatic (implantable) cardiac defibrillator: Secondary | ICD-10-CM | POA: Diagnosis not present

## 2020-12-15 DIAGNOSIS — I428 Other cardiomyopathies: Secondary | ICD-10-CM | POA: Diagnosis not present

## 2020-12-21 NOTE — Progress Notes (Signed)
EPIC Encounter for ICM Monitoring  Patient Name: Jeffery Thomas is a 70 y.o. male Date: 12/21/2020 Primary Care Physican: Libby Maw, MD Primary Cardiologist:Berry Electrophysiologist:Allred 1/5/2022Weight: 195lbs  Transmission reviewed.  CorvueThoracic impedancesuggesting normal fluid levels.  Prescribed: Spironolactone 25 mg take 1 tablet daily  Labs: 09/19/2019 Creatinine 0.91, BUN 20, Potassium 4.3, Sodium 131, GFR >60 09/12/2019 Creatinine 0.92, BUN 21, Potassium 4.8, Sodium 139, GFR >60 05/02/2019 Creatinine 1.02, BUN 16, Potassium 4.6, Sodium 136, GFR >60 A complete set of results can be found in Results Review.  Recommendations: No changes.  Follow-up plan: ICM clinic phone appointment on3/14/2022. 91 day device clinic remote transmission3/12/2020.  EP/Cardiology Office Visits:Recall 02/20/2021 with Dr Rayann Heman.   Copy of ICM check sent to Dr.Allred  3 month ICM trend: 12/21/2020.    1 Year ICM trend:       Rosalene Billings, RN 12/21/2020 4:30 PM

## 2021-01-06 ENCOUNTER — Ambulatory Visit (INDEPENDENT_AMBULATORY_CARE_PROVIDER_SITE_OTHER): Payer: Medicare Other

## 2021-01-06 DIAGNOSIS — I4901 Ventricular fibrillation: Secondary | ICD-10-CM | POA: Diagnosis not present

## 2021-01-07 ENCOUNTER — Telehealth: Payer: Self-pay

## 2021-01-07 NOTE — Telephone Encounter (Signed)
Spoke with patient to remind of missed remote transmission 

## 2021-01-08 LAB — CUP PACEART REMOTE DEVICE CHECK
Battery Remaining Longevity: 84 mo
Battery Remaining Percentage: 80 %
Battery Voltage: 2.98 V
Brady Statistic RV Percent Paced: 1 %
Date Time Interrogation Session: 20220304004011
HighPow Impedance: 81 Ohm
HighPow Impedance: 81 Ohm
Implantable Lead Implant Date: 20180828
Implantable Lead Location: 753860
Implantable Pulse Generator Implant Date: 20190915
Lead Channel Impedance Value: 390 Ohm
Lead Channel Pacing Threshold Amplitude: 0.5 V
Lead Channel Pacing Threshold Pulse Width: 0.5 ms
Lead Channel Sensing Intrinsic Amplitude: 11.9 mV
Lead Channel Setting Pacing Amplitude: 2.5 V
Lead Channel Setting Pacing Pulse Width: 0.5 ms
Lead Channel Setting Sensing Sensitivity: 0.5 mV
Pulse Gen Serial Number: 9851932

## 2021-01-15 NOTE — Progress Notes (Signed)
Remote ICD transmission.   

## 2021-01-18 ENCOUNTER — Ambulatory Visit (INDEPENDENT_AMBULATORY_CARE_PROVIDER_SITE_OTHER): Payer: Medicare Other

## 2021-01-18 DIAGNOSIS — Z95810 Presence of automatic (implantable) cardiac defibrillator: Secondary | ICD-10-CM

## 2021-01-18 DIAGNOSIS — I428 Other cardiomyopathies: Secondary | ICD-10-CM

## 2021-01-20 NOTE — Progress Notes (Signed)
EPIC Encounter for ICM Monitoring  Patient Name: Jeffery Thomas is a 70 y.o. male Date: 01/20/2021 Primary Care Physican: Libby Maw, MD Primary Cardiologist:Berry Electrophysiologist:Allred 3/16/2022Weight:195 lbs  Spoke with patient and reports feeling well at this time.  Denies fluid symptoms.  Pt reports he occasionally eats salty snacks such as salted peanuts.  CorvueThoracic impedancenormal fluid levels but was suggesting possible fluid accumulation from 3/2-3/9.  Prescribed: Spironolactone 25 mg take 1 tablet daily  Labs: 09/19/2019 Creatinine 0.91, BUN 20, Potassium 4.3, Sodium 131, GFR >60 09/12/2019 Creatinine 0.92, BUN 21, Potassium 4.8, Sodium 139, GFR >60 05/02/2019 Creatinine 1.02, BUN 16, Potassium 4.6, Sodium 136, GFR >60 A complete set of results can be found in Results Review.  Recommendations: No changes and encouraged to call if experiencing any fluid symptoms.  Follow-up plan: ICM clinic phone appointment on4/18/2022. 91 day device clinic remote transmission6/1 /2022.  EP/Cardiology Office Visits:Recall 02/20/2021 with Dr Rayann Heman.   Copy of ICM check sent to Dr.Allred   3 month ICM trend: 01/18/2021.    1 Year ICM trend:       Rosalene Billings, RN 01/20/2021 1:52 PM

## 2021-02-22 ENCOUNTER — Ambulatory Visit (INDEPENDENT_AMBULATORY_CARE_PROVIDER_SITE_OTHER): Payer: Medicare Other

## 2021-02-22 DIAGNOSIS — I428 Other cardiomyopathies: Secondary | ICD-10-CM | POA: Diagnosis not present

## 2021-02-22 DIAGNOSIS — Z95810 Presence of automatic (implantable) cardiac defibrillator: Secondary | ICD-10-CM

## 2021-02-26 ENCOUNTER — Telehealth: Payer: Self-pay

## 2021-02-26 NOTE — Telephone Encounter (Signed)
Remote ICM transmission received.  Attempted call to patient regarding ICM remote transmission and left detailed message per DPR.  Advised to return call for any fluid symptoms or questions. Next ICM remote transmission scheduled 04/08/2021.

## 2021-02-26 NOTE — Progress Notes (Signed)
EPIC Encounter for ICM Monitoring  Patient Name: Jeffery Thomas is a 70 y.o. male Date: 02/26/2021 Primary Care Physican: Libby Maw, MD Primary Cardiologist:Berry Electrophysiologist:Allred 3/16/2022Weight:195 lbs  Attempted call to patient and unable to reach.  Left detailed message per DPR regarding transmission. Transmission reviewed.   CorvueThoracic impedancenormal fluid levels but was suggesting possible fluid accumulation from 3/23-3/29.  Prescribed: Spironolactone 25 mg take 1 tablet daily  Labs: 09/19/2019 Creatinine 0.91, BUN 20, Potassium 4.3, Sodium 131, GFR >60 09/12/2019 Creatinine 0.92, BUN 21, Potassium 4.8, Sodium 139, GFR >60 05/02/2019 Creatinine 1.02, BUN 16, Potassium 4.6, Sodium 136, GFR >60 A complete set of results can be found in Results Review.  Recommendations: Left voice mail with ICM number and encouraged to call if experiencing any fluid symptoms.  Follow-up plan: ICM clinic phone appointment on6/12/2020. 91 day device clinic remote transmission6/1 /2022.  EP/Cardiology Office Visits:Recall 02/20/2021 with Dr Rayann Heman. Recall 09/18/2021 with Dr Gwenlyn Found.  Copy of ICM check sent to Dr.Allred   3 month ICM trend: 02/22/2021.    1 Year ICM trend:       Rosalene Billings, RN 02/26/2021 10:20 AM

## 2021-04-02 ENCOUNTER — Other Ambulatory Visit: Payer: Self-pay | Admitting: Family Medicine

## 2021-04-07 ENCOUNTER — Ambulatory Visit (INDEPENDENT_AMBULATORY_CARE_PROVIDER_SITE_OTHER): Payer: Medicare Other

## 2021-04-07 DIAGNOSIS — I428 Other cardiomyopathies: Secondary | ICD-10-CM | POA: Diagnosis not present

## 2021-04-09 ENCOUNTER — Telehealth: Payer: Self-pay

## 2021-04-09 ENCOUNTER — Ambulatory Visit (INDEPENDENT_AMBULATORY_CARE_PROVIDER_SITE_OTHER): Payer: Medicare Other

## 2021-04-09 DIAGNOSIS — I428 Other cardiomyopathies: Secondary | ICD-10-CM

## 2021-04-09 DIAGNOSIS — Z95810 Presence of automatic (implantable) cardiac defibrillator: Secondary | ICD-10-CM | POA: Diagnosis not present

## 2021-04-09 NOTE — Telephone Encounter (Signed)
Remote ICM transmission received.  Attempted call to patient regarding ICM remote transmission and left detailed message per DPR.  Advised to return call for any fluid symptoms or questions. Next ICM remote transmission scheduled 05/11/2021.

## 2021-04-09 NOTE — Progress Notes (Signed)
EPIC Encounter for ICM Monitoring  Patient Name: Jeffery Thomas is a 70 y.o. male Date: 04/09/2021 Primary Care Physican: Libby Maw, MD Primary Cardiologist:Berry Electrophysiologist:Allred 3/16/2022Weight:195 lbs  Attempted call to patient and unable to reach.  Left detailed message per DPR regarding transmission. Transmission reviewed.   CorvueThoracic impedance suggesting possible dryness since 04/06/2021 but was suggesting possible fluid accumulation from 5/23-5/29.  Prescribed: Spironolactone 25 mg take 1 tablet daily  Labs: 09/19/2019 Creatinine 0.91, BUN 20, Potassium 4.3, Sodium 131, GFR >60 09/12/2019 Creatinine 0.92, BUN 21, Potassium 4.8, Sodium 139, GFR >60 05/02/2019 Creatinine 1.02, BUN 16, Potassium 4.6, Sodium 136, GFR >60 A complete set of results can be found in Results Review.  Recommendations: Left voice mail with ICM number and encouraged to maintain fluid intake of 64 oz daily to avoid dehydration.  Follow-up plan: ICM clinic phone appointment on7/03/2021. 91 day device clinic remote transmission8/31/2022.  EP/Cardiology Office Visits:Recall 02/20/2021 with Dr Rayann Heman. Recall 09/18/2021 with Dr Gwenlyn Found.  Copy of ICM check sent to Dr.Allred  3 month ICM trend: 04/09/2021.    1 Year ICM trend:       Rosalene Billings, RN 04/09/2021 3:36 PM

## 2021-04-11 LAB — CUP PACEART REMOTE DEVICE CHECK
Battery Remaining Longevity: 82 mo
Battery Remaining Percentage: 77 %
Battery Voltage: 2.98 V
Brady Statistic RV Percent Paced: 1 %
Date Time Interrogation Session: 20220603001717
HighPow Impedance: 86 Ohm
HighPow Impedance: 86 Ohm
Implantable Lead Implant Date: 20180828
Implantable Lead Location: 753860
Implantable Pulse Generator Implant Date: 20190915
Lead Channel Impedance Value: 410 Ohm
Lead Channel Pacing Threshold Amplitude: 0.5 V
Lead Channel Pacing Threshold Pulse Width: 0.5 ms
Lead Channel Sensing Intrinsic Amplitude: 11.9 mV
Lead Channel Setting Pacing Amplitude: 2.5 V
Lead Channel Setting Pacing Pulse Width: 0.5 ms
Lead Channel Setting Sensing Sensitivity: 0.5 mV
Pulse Gen Serial Number: 9851932

## 2021-04-30 NOTE — Progress Notes (Signed)
Remote ICD transmission.   

## 2021-05-11 ENCOUNTER — Ambulatory Visit (INDEPENDENT_AMBULATORY_CARE_PROVIDER_SITE_OTHER): Payer: Medicare Other

## 2021-05-11 DIAGNOSIS — Z95810 Presence of automatic (implantable) cardiac defibrillator: Secondary | ICD-10-CM

## 2021-05-11 DIAGNOSIS — I428 Other cardiomyopathies: Secondary | ICD-10-CM | POA: Diagnosis not present

## 2021-05-12 NOTE — Progress Notes (Signed)
EPIC Encounter for ICM Monitoring  Patient Name: Jeffery Thomas is a 70 y.o. male Date: 05/12/2021 Primary Care Physican: Libby Maw, MD Primary Cardiologist: Gwenlyn Found Electrophysiologist: Allred 05/12/2021 Weight: 200 lbs                                               Spoke with patient and heart failure questions reviewed.  Pt asymptomatic for fluid accumulation and feeing well.   Corvue Thoracic impedance suggesting normal fluid levels.    Prescribed: Spironolactone 25 mg take 1 tablet daily       Labs: 09/19/2019 Creatinine 0.91, BUN 20, Potassium 4.3, Sodium 131, GFR >60 09/12/2019 Creatinine 0.92, BUN 21, Potassium 4.8, Sodium 139, GFR >60 05/02/2019 Creatinine 1.02, BUN 16, Potassium 4.6, Sodium 136, GFR >60 A complete set of results can be found in Results Review.   Recommendations:  No changes and encouraged to call if experiencing any fluid symptoms.   Follow-up plan: ICM clinic phone appointment on 06/14/2021.   91 day device clinic remote transmission 8/31 /2022.      EP/Cardiology Office Visits:  Recall 02/20/2021 with Dr Rayann Heman.  09/24/2021 with Dr Gwenlyn Found.  Message sent to EP Scheduler 05/12/2021 to call pt for overdue appointment with Dr Rayann Heman.   Copy of ICM check sent to Dr. Rayann Heman.   3 month ICM trend: 05/11/2021.    1 Year ICM trend:       Rosalene Billings, RN 05/12/2021 11:30 AM

## 2021-05-25 ENCOUNTER — Encounter: Payer: Self-pay | Admitting: Internal Medicine

## 2021-06-10 ENCOUNTER — Ambulatory Visit: Payer: Medicare Other

## 2021-06-14 ENCOUNTER — Ambulatory Visit (INDEPENDENT_AMBULATORY_CARE_PROVIDER_SITE_OTHER): Payer: Medicare Other

## 2021-06-14 DIAGNOSIS — I428 Other cardiomyopathies: Secondary | ICD-10-CM

## 2021-06-14 DIAGNOSIS — Z95810 Presence of automatic (implantable) cardiac defibrillator: Secondary | ICD-10-CM | POA: Diagnosis not present

## 2021-06-16 ENCOUNTER — Telehealth: Payer: Self-pay

## 2021-06-16 NOTE — Telephone Encounter (Signed)
Remote ICM transmission received.  Attempted call to patient regarding ICM remote transmission and left detailed message per DPR.  Advised to return call for any fluid symptoms or questions. Next ICM remote transmission scheduled 07/19/2021.

## 2021-06-16 NOTE — Progress Notes (Signed)
EPIC Encounter for ICM Monitoring  Patient Name: Jeffery Thomas is a 69 y.o. male Date: 06/16/2021 Primary Care Physican: Libby Maw, MD Primary Cardiologist: Gwenlyn Found Electrophysiologist: Allred 05/12/2021 Weight: 200 lbs                                               Attempted call to patient and unable to reach.  Left detailed message per DPR regarding transmission. Transmission reviewed.    Corvue Thoracic impedance suggesting normal fluid levels.    Prescribed: Spironolactone 25 mg take 1 tablet daily       Labs: 09/19/2019 Creatinine 0.91, BUN 20, Potassium 4.3, Sodium 131, GFR >60 09/12/2019 Creatinine 0.92, BUN 21, Potassium 4.8, Sodium 139, GFR >60 05/02/2019 Creatinine 1.02, BUN 16, Potassium 4.6, Sodium 136, GFR >60 A complete set of results can be found in Results Review.   Recommendations: Left voice mail with ICM number and encouraged to call if experiencing any fluid symptoms.   Follow-up plan: ICM clinic phone appointment on 07/19/2021.   91 day device clinic remote transmission 07/07/2021.      EP/Cardiology Office Visits:  Recall 02/20/2021 with Dr Rayann Heman.  09/24/2021 with Dr Gwenlyn Found.  Message sent to EP Scheduler 05/12/2021 to call pt for overdue appointment with Dr Rayann Heman.   Copy of ICM check sent to Dr. Rayann Heman.   3 month ICM trend: 06/14/2021.    1 Year ICM trend:       Rosalene Billings, RN 06/16/2021 3:01 PM

## 2021-06-29 ENCOUNTER — Ambulatory Visit (INDEPENDENT_AMBULATORY_CARE_PROVIDER_SITE_OTHER): Payer: Medicare Other | Admitting: *Deleted

## 2021-06-29 DIAGNOSIS — Z0000 Encounter for general adult medical examination without abnormal findings: Secondary | ICD-10-CM

## 2021-06-29 NOTE — Progress Notes (Signed)
Subjective:   Jeffery Thomas is a 70 y.o. male who presents for Medicare Annual/Subsequent preventive examination.  I connected with  Yacov Tapley Magowan on 06/29/21 by a  telephone enabled telemedicine application and verified that I am speaking with the correct person using two identifiers.   I discussed the limitations of evaluation and management by telemedicine. The patient expressed understanding and agreed to proceed.   Review of Systems    NA Cardiac Risk Factors include: advanced age (>28mn, >>14women);family history of premature cardiovascular disease;hypertension;male gender     Objective:    Today's Vitals   There is no height or weight on file to calculate BMI.  Advanced Directives 06/29/2021 06/04/2020 09/18/2019 09/12/2019 05/02/2019 04/02/2019 07/21/2018  Does Patient Have a Medical Advance Directive? Yes Yes Yes Yes Yes Yes Yes  Type of AParamed[place]of [place] will Living will [place]Living will HNaples ManorLiving will HNodawayLiving will HSlating[place]Living will Living will  Does patient want to make changes to medical advance directive? - - No - Patient declined No - Patient declined No - Patient declined - No - Patient declined  Copy of [place] Chart? No - copy requested - - No - copy requested No - copy requested No - copy requested -  Would patient like information on creating a medical advance directive? - - - - - - -    Current Medications (verified) Outpatient Encounter Medications as of 06/29/2021  Medication Sig   atorvastatin (LIPITOR) 20 MG tablet TAKE 1 TABLET DAILY   carvedilol (COREG) 12.5 MG tablet Take 1 tablet (12.5 mg total) by mouth 2 (two) times daily with a meal.   losartan (COZAAR) 100 MG tablet TAKE 1 TABLET DAILY (NEED APPOINTMENT FOR FUTURE REFILLS)   omeprazole (PRILOSEC) 40 MG capsule TAKE 1 CAPSULE DAILY    spironolactone (ALDACTONE) 25 MG tablet TAKE 1 TABLET DAILY   amoxicillin (AMOXIL) 500 MG tablet amoxicillin 500 mg tablet  4 tablets 1 hour prior to dental procedure   cholecalciferol (VITAMIN D3) 25 MCG (1000 UNIT) tablet Take 1,000 Units by mouth daily. (Patient not taking: Reported on 06/29/2021)   HYDROcodone-acetaminophen (NORCO/VICODIN) 5-325 MG tablet Take 1-2 tablets by mouth every 6 (six) hours as needed for severe pain. (Patient not taking: Reported on 06/29/2021)   meloxicam (MOBIC) 15 MG tablet TAKE 1 TABLET DAILY (Patient not taking: Reported on 06/29/2021)   montelukast (SINGULAIR) 10 MG tablet Take 1 tablet (10 mg total) by mouth daily. (Patient not taking: Reported on 06/29/2021)   No facility-administered encounter medications on file as of 06/29/2021.    Allergies (verified) Patient has no known allergies.   History: Past Medical History:  Diagnosis Date   AICD (automatic cardioverter/defibrillator) present    Chronic systolic CHF (congestive heart failure) (HCC)    Gastroesophageal reflux disease    History of alcohol abuse    quit 08/ 20/ 2018   History of cardiac arrest    History of encephalopathy 07/05/2017   anoxic-ischemic    History of ST elevation myocardial infarction (STEMI) 06/26/2017   sudden cardiac arrest /  per cardiac cath 06-26-2017 normal coronaries, ef <20% , severe LVSD; dx takatsuki syndrome   History of sudden cardiac arrest 06/26/2017   VF arrest / STEMI    Hypertension    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   premature generator change 07-22-2018--  followed by dr allred  ([facility]  [place], [place])   Inguinal hernia    left   Left hydrocele    Left inguinal hernia    Left inguinal hernia 05/07/2019   Myocardial infarction [facility])    NICM (nonischemic cardiomyopathy) ([place])    06-26-2017  per cardiac cath , ef <20% (echo 35-40%)  ;  last echo 10-09-2014 ef 60-65%   OSA (obstructive sleep apnea)    03-26-2019 per pt had  sleep study after heart attack, was told no recommendation , but did stop smoking and alcohol   Pre-diabetes    denies   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   sudden cardiac arrest w/ VF and STEMI,  s/p  ICD 07-04-2017   Past Surgical History:  Procedure Laterality Date   CATARACT EXTRACTION W/ INTRAOCULAR LENS IMPLANT Left 2017   COLONOSCOPY     HYDROCELE EXCISION Left 04/02/2019   Procedure: HYDROCELECTOMY ADULT;  Surgeon: Festus Aloe, MD;  [facility];  Service: Urology;  Laterality: Left;   ICD GENERATOR CHANGEOUT N/A 07/22/2018   Procedure: [place];  Surgeon: Evans Lance, MD;  Location: [place] [facility];  Service: Cardiovascular;  Laterality: N/A;   ICD IMPLANT N/A 07/04/2017   SJM Fortify Assura VR ICD implanted by Dr Rayann Heman for secondary prevention after VF arrest   INGUINAL HERNIA REPAIR Left 05/07/2019   Procedure: OPEN REPAIR LEFT INGUINAL HERNIA WITH MESH;  Surgeon: Fanny Skates, MD;  Location: [place];  Service: General;  Laterality: Left;   LEFT HEART CATH AND CORONARY ANGIOGRAPHY N/A 06/26/2017   Procedure: LEFT HEART CATH AND CORONARY ANGIOGRAPHY;  Surgeon: Lorretta Harp, MD;  Location: [place] [facility];  Service: Cardiovascular;  Laterality: N/A;   MENISCUS REPAIR Left 1983   left knee   TOTAL HIP ARTHROPLASTY Right 09/18/2019   Procedure: TOTAL HIP ARTHROPLASTY ANTERIOR APPROACH;  Surgeon: Gaynelle Arabian, MD;  Location: [facility];  Service: Orthopedics;  Laterality: Right;   TOTAL KNEE ARTHROPLASTY Left 2009  approx.   TOTAL KNEE REVISION Left 04/18/2018   Procedure: LEFT TOTAL KNEE REVISION;  Surgeon: Gaynelle Arabian, MD;  Location: [facility];  Service: Orthopedics;  Laterality: Left;  Adductor Block   Family History  Problem Relation Age of Onset   Hypertension Mother    Diabetes Father    Cancer Brother        pancreatic   Social History   Socioeconomic History   Marital status: Married    Spouse name: Not on file    Number of children: Not on file   Years of education: Not on file   Highest education level: Not on file  Occupational History   Not on file  Tobacco Use   Smoking status: Former    Types: Cigars    Quit date: 06/26/2017    Years since quitting: 4.0   Smokeless tobacco: Never  Vaping Use   Vaping Use: Never used  Substance and Sexual Activity   Alcohol use: Yes    Alcohol/week: 6.0 standard drinks    Types: 6 Shots of liquor per week   Drug use: Never   Sexual activity: Yes  Other Topics Concern   Not on file  Social History Narrative   Not on file   Social Determinants of Health   Financial Resource Strain: Low Risk    Difficulty of Paying Living Expenses: Not hard at all  Food Insecurity: No Food Insecurity   Worried About [place] in the Last Year: Never true  Ran Out of Food in the Last Year: Never true  Transportation Needs: No Transportation Needs   Lack of Transportation (Medical): No   Lack of Transportation (Non-Medical): No  Physical Activity: Unknown   Days of Exercise per Week: Not on file   Minutes of Exercise per Session: 50 min  Stress: No Stress Concern Present   Feeling of Stress : Not at all  Social Connections: Moderately Integrated   Frequency of Communication with Friends and Family: Twice a week   Frequency of Social Gatherings with Friends and Family: More than three times a week   Attends Religious Services: Never   Marine scientist or Organizations: Yes   Attends Music therapist: More than 4 times per year   Marital Status: Married    Tobacco Counseling Counseling given: Not Answered   Clinical Intake:  Pre-visit preparation completed: Yes  Pain : No/denies pain     Nutritional Risks: None Diabetes: No  How often do you need to have someone help you when you read instructions, pamphlets, or other written materials from your doctor or pharmacy?: 1 - Never  Diabetic?no  Interpreter Needed?:  No  Information entered by :: Leroy Kennedy LPN   Activities of Daily Living In your present state of health, do you have any difficulty performing the following activities: 06/29/2021  Hearing? N  Vision? N  Difficulty concentrating or making decisions? N  Walking or climbing stairs? N  Dressing or bathing? N  Doing errands, shopping? N  Preparing Food and eating ? N  Using the Toilet? N  In the past six months, have you accidently leaked urine? N  Do you have problems with loss of bowel control? N  Managing your Medications? N  Managing your Finances? N  Housekeeping or managing your Housekeeping? N  Some recent data might be hidden    Patient Care Team: Libby Maw, MD as PCP - General (Family Medicine) Lorretta Harp, MD as PCP - Cardiology (Cardiology)  Indicate any recent Medical Services you may have received from other than [facility] providers in the past year (date may be approximate).     Assessment:   This is a routine wellness examination for [place].  Hearing/Vision screen Hearing Screening - Comments:: No trouble hearing Vision Screening - Comments:: Patients not up date Will call if needs referral  Dietary issues and exercise activities discussed: Current Exercise Habits: Home exercise routine, Type of exercise: walking (golf), Time (Minutes): 45, Frequency (Times/Week): 5, Weekly Exercise (Minutes/Week): 225, Intensity: Moderate, Exercise limited by: None identified   Goals Addressed             This Visit's Progress    Patient Stated   On track    Eat healthier & continue walking     Patient Stated       Continue current lifestyle       Depression Screen PHQ 2/9 Scores 06/29/2021 06/04/2020 07/17/2018 07/17/2018  PHQ - 2 Score 0 0 0 0    Fall Risk Fall Risk  06/29/2021 06/04/2020 08/14/2017  Falls in the past year? 0 0 No  Number falls in past yr: 0 0 -  Injury with Fall? 0 0 -  Follow up Falls evaluation completed;Falls prevention  discussed Falls prevention discussed -    FALL RISK PREVENTION PERTAINING TO THE HOME:  Any stairs in or around the home? Yes  If so, are there any without handrails? No  Home free of loose throw rugs in  walkways, pet beds, electrical cords, etc? Yes  Adequate lighting in your home to reduce risk of falls? Yes   ASSISTIVE DEVICES UTILIZED TO PREVENT FALLS:  Life alert? No  Use of a cane, walker or w/c? No  Grab bars in the bathroom? No  Shower chair or bench in shower? No  Elevated toilet seat or a handicapped toilet? No   TIMED UP AND GO:  Was the test performed? No .    Cognitive Function:  Normal cognitive status assessed by direct observation by this Nurse Health Advisor. No abnormalities found.          Immunizations Immunization History  Administered Date(s) Administered   Fluad Quad(high Dose 65+) 08/27/2019   PFIZER(Purple Top)SARS-COV-2 Vaccination 01/06/2020, 01/27/2020   Pneumococcal Conjugate-13 07/17/2018   Tdap 09/21/2016    TDAP status: Up to date  Flu Vaccine status: Due, Education has been provided regarding the importance of this vaccine. Advised may receive this vaccine at local pharmacy or Health Dept. Aware to provide a copy of the vaccination record if obtained from local pharmacy or Health Dept. Verbalized acceptance and understanding.  Pneumococcal vaccine status: Due, Education has been provided regarding the importance of this vaccine. Advised may receive this vaccine at local pharmacy or Health Dept. Aware to provide a copy of the vaccination record if obtained from local pharmacy or Health Dept. Verbalized acceptance and understanding.  Covid-19 vaccine status: Information provided on how to obtain vaccines.   Qualifies for Shingles Vaccine? Yes   Zostavax completed No   Shingrix Completed?: No.    Education has been provided regarding the importance of this vaccine. Patient has been advised to call insurance company to determine out of  pocket expense if they have not yet received this vaccine. Advised may also receive vaccine at local pharmacy or Health Dept. Verbalized acceptance and understanding.  Screening Tests Health Maintenance  Topic Date Due   Hepatitis C Screening  Never done   Zoster Vaccines- Shingrix (1 of 2) Never done   PNA vac Low Risk Adult (2 of 2 - PPSV23) 07/18/2019   COVID-19 Vaccine (3 - Pfizer risk series) 02/24/2020   INFLUENZA VACCINE  06/07/2021   COLONOSCOPY (Pts 45-25yr Insurance coverage will need to be confirmed)  05/08/2023 (Originally 05/21/1998)   TETANUS/TDAP  09/21/2026   HPV VACCINES  Aged Out    Health Maintenance  Health Maintenance Due  Topic Date Due   Hepatitis C Screening  Never done   Zoster Vaccines- Shingrix (1 of 2) Never done   PNA vac Low Risk Adult (2 of 2 - PPSV23) 07/18/2019   COVID-19 Vaccine (3 - Pfizer risk series) 02/24/2020   INFLUENZA VACCINE  06/07/2021    Colorectal cancer screening: Type of screening: Colonoscopy. Completed 2017. Repeat every 5 years  Lung Cancer Screening: (Low Dose CT Chest recommended if Age 68-80years, 30 pack-year currently smoking OR have quit w/in 15years.) does not qualify.   Lung Cancer Screening Referral:   Additional Screening:  Hepatitis C Screening: does qualify;  Vision Screening: Recommended annual ophthalmology exams for early detection of glaucoma and other disorders of the eye. Is the patient up to date with their annual eye exam?  No  Who is the provider or what is the name of the office in which the patient attends annual eye exams? Will call when needs a referral If pt is not established with a provider, would they like to be referred to a provider to establish care? No .  Dental Screening: Recommended annual dental exams for proper oral hygiene  Community Resource Referral / Chronic Care Management: CRR required this visit?  No   CCM required this visit?  No      Plan:     I have personally  reviewed and noted the following in the patient's chart:   Medical and social history Use of alcohol, tobacco or illicit drugs  Current medications and supplements including opioid prescriptions. Patient is not currently taking opioid prescriptions. Functional ability and status Nutritional status Physical activity Advanced directives List of other physicians Hospitalizations, surgeries, and ER visits in previous 12 months Vitals Screenings to include cognitive, depression, and falls Referrals and appointments  In addition, I have reviewed and discussed with patient certain preventive protocols, quality metrics, and best practice recommendations. A written personalized care plan for preventive services as well as general preventive health recommendations were provided to patient.     Leroy Kennedy, LPN   [phone]   Nurse Notes: na

## 2021-06-29 NOTE — Patient Instructions (Signed)
Jeffery Thomas , Thank you for taking time to come for your Medicare Wellness Visit. I appreciate your ongoing commitment to your health goals. Please review the following plan we discussed and let me know if I can assist you in the future.   Screening recommendations/referrals: Colonoscopy: up to date Recommended yearly ophthalmology/optometry visit for glaucoma screening and checkup Recommended yearly dental visit for hygiene and checkup  Vaccinations: Influenza vaccine: Education provided Pneumococcal vaccine: Education provided Tdap vaccine: up tod ate Shingles vaccine: Education provided    Advanced directives: copy requested  Conditions/risks identified: na    Preventive Care 68 Years and Older, Male Preventive care refers to lifestyle choices and visits with your health care provider that can promote health and wellness. What does preventive care include? A yearly physical exam. This is also called an annual well check. Dental exams once or twice a year. Routine eye exams. Ask your health care provider how often you should have your eyes checked. Personal lifestyle choices, including: Daily care of your teeth and gums. Regular physical activity. Eating a healthy diet. Avoiding tobacco and drug use. Limiting alcohol use. Practicing safe sex. Taking low doses of aspirin every day. Taking vitamin and mineral supplements as recommended by your health care provider. What happens during an annual well check? The services and screenings done by your health care provider during your annual well check will depend on your age, overall health, lifestyle risk factors, and family history of disease. Counseling  Your health care provider may ask you questions about your: Alcohol use. Tobacco use. Drug use. Emotional well-being. Home and relationship well-being. Sexual activity. Eating habits. History of falls. Memory and ability to understand (cognition). Work and work  Statistician. Screening  You may have the following tests or measurements: Height, weight, and BMI. Blood pressure. Lipid and cholesterol levels. These may be checked every 5 years, or more frequently if you are over 68 years old. Skin check. Lung cancer screening. You may have this screening every year starting at age 68 if you have a 30-pack-year history of smoking and currently smoke or have quit within the past 15 years. Fecal occult blood test (FOBT) of the stool. You may have this test every year starting at age 68. Flexible sigmoidoscopy or colonoscopy. You may have a sigmoidoscopy every 5 years or a colonoscopy every 10 years starting at age 68. Prostate cancer screening. Recommendations will vary depending on your family history and other risks. Hepatitis C blood test. Hepatitis B blood test. Sexually transmitted disease (STD) testing. Diabetes screening. This is done by checking your blood sugar (glucose) after you have not eaten for a while (fasting). You may have this done every 1-3 years. Abdominal aortic aneurysm (AAA) screening. You may need this if you are a current or former smoker. Osteoporosis. You may be screened starting at age 68 if you are at high risk. Talk with your health care provider about your test results, treatment options, and if necessary, the need for more tests. Vaccines  Your health care provider may recommend certain vaccines, such as: Influenza vaccine. This is recommended every year. Tetanus, diphtheria, and acellular pertussis (Tdap, Td) vaccine. You may need a Td booster every 10 years. Zoster vaccine. You may need this after age 68. Pneumococcal 13-valent conjugate (PCV13) vaccine. One dose is recommended after age 68. Pneumococcal polysaccharide (PPSV23) vaccine. One dose is recommended after age 68. Talk to your health care provider about which screenings and vaccines you need and how often you need  them. This information is not intended to replace  advice given to you by your health care provider. Make sure you discuss any questions you have with your health care provider. Document Released: 11/20/2015 Document Revised: 07/13/2016 Document Reviewed: 08/25/2015 Elsevier Interactive Patient Education  2017 [place] Prevention in the Home Falls can cause injuries. They can happen to people of all ages. There are many things you can do to make your home safe and to help prevent falls. What can I do on the outside of my home? Regularly fix the edges of walkways and driveways and fix any cracks. Remove anything that might make you trip as you walk through a door, such as a raised step or threshold. Trim any bushes or trees on the path to your home. Use bright outdoor lighting. Clear any walking paths of anything that might make someone trip, such as rocks or tools. Regularly check to see if handrails are loose or broken. Make sure that both sides of any steps have handrails. Any raised decks and porches should have guardrails on the edges. Have any leaves, snow, or ice cleared regularly. Use sand or salt on walking paths during winter. Clean up any spills in your garage right away. This includes oil or grease spills. What can I do in the bathroom? Use night lights. Install grab bars by the toilet and in the tub and shower. Do not use towel bars as grab bars. Use non-skid mats or decals in the tub or shower. If you need to sit down in the shower, use a plastic, non-slip stool. Keep the floor dry. Clean up any water that spills on the floor as soon as it happens. Remove soap buildup in the tub or shower regularly. Attach bath mats securely with double-sided non-slip rug tape. Do not have throw rugs and other things on the floor that can make you trip. What can I do in the bedroom? Use night lights. Make sure that you have a light by your bed that is easy to reach. Do not use any sheets or blankets that are too big for your bed.  They should not hang down onto the floor. Have a firm chair that has side arms. You can use this for support while you get dressed. Do not have throw rugs and other things on the floor that can make you trip. What can I do in the kitchen? Clean up any spills right away. Avoid walking on wet floors. Keep items that you use a lot in easy-to-reach places. If you need to reach something above you, use a strong step stool that has a grab bar. Keep electrical cords out of the way. Do not use floor polish or wax that makes floors slippery. If you must use wax, use non-skid floor wax. Do not have throw rugs and other things on the floor that can make you trip. What can I do with my stairs? Do not leave any items on the stairs. Make sure that there are handrails on both sides of the stairs and use them. Fix handrails that are broken or loose. Make sure that handrails are as long as the stairways. Check any carpeting to make sure that it is firmly attached to the stairs. Fix any carpet that is loose or worn. Avoid having throw rugs at the top or bottom of the stairs. If you do have throw rugs, attach them to the floor with carpet tape. Make sure that you have a light switch at  the top of the stairs and the bottom of the stairs. If you do not have them, ask someone to add them for you. What else can I do to help prevent falls? Wear shoes that: Do not have high heels. Have rubber bottoms. Are comfortable and fit you well. Are closed at the toe. Do not wear sandals. If you use a stepladder: Make sure that it is fully opened. Do not climb a closed stepladder. Make sure that both sides of the stepladder are locked into place. Ask someone to hold it for you, if possible. Clearly mark and make sure that you can see: Any grab bars or handrails. First and last steps. Where the edge of each step is. Use tools that help you move around (mobility aids) if they are needed. These  include: Canes. Walkers. Scooters. Crutches. Turn on the lights when you go into a dark area. Replace any light bulbs as soon as they burn out. Set up your furniture so you have a clear path. Avoid moving your furniture around. If any of your floors are uneven, fix them. If there are any pets around you, be aware of where they are. Review your medicines with your doctor. Some medicines can make you feel dizzy. This can increase your chance of falling. Ask your doctor what other things that you can do to help prevent falls. This information is not intended to replace advice given to you by your health care provider. Make sure you discuss any questions you have with your health care provider. Document Released: 08/20/2009 Document Revised: 03/31/2016 Document Reviewed: 11/28/2014 Elsevier Interactive Patient Education  2017 [employer].

## 2021-07-07 ENCOUNTER — Ambulatory Visit (INDEPENDENT_AMBULATORY_CARE_PROVIDER_SITE_OTHER): Payer: Medicare Other

## 2021-07-07 DIAGNOSIS — I428 Other cardiomyopathies: Secondary | ICD-10-CM

## 2021-07-09 ENCOUNTER — Other Ambulatory Visit: Payer: Self-pay | Admitting: Cardiovascular Disease

## 2021-07-13 LAB — CUP PACEART REMOTE DEVICE CHECK
Battery Remaining Longevity: 79 mo
Battery Remaining Percentage: 76 %
Battery Voltage: 2.98 V
Brady Statistic RV Percent Paced: 1 %
Date Time Interrogation Session: 20220831020017
HighPow Impedance: 79 Ohm
HighPow Impedance: 79 Ohm
Implantable Lead Implant Date: 20180828
Implantable Lead Location: 753860
Implantable Pulse Generator Implant Date: 20190915
Lead Channel Impedance Value: 380 Ohm
Lead Channel Pacing Threshold Amplitude: 0.5 V
Lead Channel Pacing Threshold Pulse Width: 0.5 ms
Lead Channel Sensing Intrinsic Amplitude: 11.3 mV
Lead Channel Setting Pacing Amplitude: 2.5 V
Lead Channel Setting Pacing Pulse Width: 0.5 ms
Lead Channel Setting Sensing Sensitivity: 0.5 mV
Pulse Gen Serial Number: 9851932

## 2021-07-19 ENCOUNTER — Telehealth: Payer: Self-pay

## 2021-07-19 ENCOUNTER — Ambulatory Visit (INDEPENDENT_AMBULATORY_CARE_PROVIDER_SITE_OTHER): Payer: Medicare Other

## 2021-07-19 DIAGNOSIS — Z95810 Presence of automatic (implantable) cardiac defibrillator: Secondary | ICD-10-CM

## 2021-07-19 DIAGNOSIS — I428 Other cardiomyopathies: Secondary | ICD-10-CM

## 2021-07-19 NOTE — Progress Notes (Signed)
EPIC Encounter for ICM Monitoring  Patient Name: Jeffery Thomas is a 70 y.o. male Date: 07/19/2021 Primary Care Physican: Libby Maw, MD Primary Cardiologist: Gwenlyn Found Electrophysiologist: Allred 05/12/2021 Weight: 200 lbs                                               Attempted call to patient and unable to reach.  Left detailed message per DPR regarding transmission. Transmission reviewed.    Corvue Thoracic impedance suggesting possible fluid accumulation starting 9/11.    Prescribed: Spironolactone 25 mg take 1 tablet daily       Labs: 09/19/2019 Creatinine 0.91, BUN 20, Potassium 4.3, Sodium 131, GFR >60 09/12/2019 Creatinine 0.92, BUN 21, Potassium 4.8, Sodium 139, GFR >60 05/02/2019 Creatinine 1.02, BUN 16, Potassium 4.6, Sodium 136, GFR >60 A complete set of results can be found in Results Review.   Recommendations: Left voice mail with ICM number and encouraged to call if experiencing any fluid symptoms.   Follow-up plan: ICM clinic phone appointment on 07/27/2021 to recheck fluid levels.   91 day device clinic remote transmission 10/06/2021.      EP/Cardiology Office Visits:  Recall 02/20/2021 with Dr Rayann Heman.  09/24/2021 with Dr Gwenlyn Found.    Copy of ICM check sent to Dr. Rayann Heman.   Will send copy to Dr Gwenlyn Found if patient is reached.   3 month ICM trend: 07/19/2021.    1 Year ICM trend:       Rosalene Billings, RN 07/19/2021 12:05 PM

## 2021-07-19 NOTE — Telephone Encounter (Signed)
Remote ICM transmission received.  Attempted call to patient regarding ICM remote transmission and left detailed message per DPR.  Advised to return call for any fluid symptoms or questions. Next ICM remote transmission scheduled 07/27/2021.

## 2021-07-20 NOTE — Progress Notes (Signed)
Remote ICD transmission.   

## 2021-07-27 ENCOUNTER — Other Ambulatory Visit: Payer: Self-pay | Admitting: Internal Medicine

## 2021-07-27 ENCOUNTER — Other Ambulatory Visit: Payer: Self-pay | Admitting: Family Medicine

## 2021-07-27 ENCOUNTER — Ambulatory Visit (INDEPENDENT_AMBULATORY_CARE_PROVIDER_SITE_OTHER): Payer: Medicare Other

## 2021-07-27 DIAGNOSIS — I428 Other cardiomyopathies: Secondary | ICD-10-CM

## 2021-07-27 DIAGNOSIS — K219 Gastro-esophageal reflux disease without esophagitis: Secondary | ICD-10-CM

## 2021-07-27 DIAGNOSIS — Z95810 Presence of automatic (implantable) cardiac defibrillator: Secondary | ICD-10-CM

## 2021-07-28 ENCOUNTER — Telehealth: Payer: Self-pay

## 2021-07-28 NOTE — Progress Notes (Signed)
EPIC Encounter for ICM Monitoring  Patient Name: Jeffery Thomas is a 70 y.o. male Date: 07/28/2021 Primary Care Physican: Libby Maw, MD Primary Cardiologist: Gwenlyn Found Electrophysiologist: Allred 05/12/2021 Weight: 200 lbs                                               Attempted call to patient and unable to reach.  Left detailed message per DPR regarding transmission. Transmission reviewed.    Corvue Thoracic impedance suggesting possible fluid accumulation starting 9/16 and starting to trend back toward baseline.  Also decreased impedance suggesting fluid accumulation 9/11-9/14.    Prescribed: Spironolactone 25 mg take 1 tablet daily       Labs: 09/19/2019 Creatinine 0.91, BUN 20, Potassium 4.3, Sodium 131, GFR >60 09/12/2019 Creatinine 0.92, BUN 21, Potassium 4.8, Sodium 139, GFR >60 05/02/2019 Creatinine 1.02, BUN 16, Potassium 4.6, Sodium 136, GFR >60 A complete set of results can be found in Results Review.   Recommendations: Left voice mail with ICM number and encouraged to call if experiencing any fluid symptoms.   Follow-up plan: ICM clinic phone appointment on 08/02/2021 (manual) to recheck fluid levels.   91 day device clinic remote transmission 10/06/2021.      EP/Cardiology Office Visits:  Recall 02/20/2021 with Dr Rayann Heman.  09/24/2021 with Dr Gwenlyn Found.    Copy of ICM check sent to Dr. Rayann Heman.   Will send copy to Dr Gwenlyn Found for review if patient is reached.    3 month ICM trend: 07/27/2021.    1 Year ICM trend:       Rosalene Billings, RN 07/28/2021 12:59 PM

## 2021-07-28 NOTE — Telephone Encounter (Signed)
Remote ICM transmission received.  Attempted call to patient regarding ICM remote transmission and left detailed message per DPR.  Advised to return call for any fluid symptoms or questions.  

## 2021-07-28 NOTE — Progress Notes (Signed)
Spoke with patient.  Transmission reviewed.  Patient is asymptomatic for fluid symptoms.  He reports drinking excessive amount of water.  He reports he bought a new home that has an acre of land.  He has been doing a lot of yard work and drinking at least 10-12 bottles of water in addition to other fluids daily.   He also has been playing golf in the heat which is another reason for increased fluid intake.  Advised to try to limit fluids to 64 oz daily but if in outside working in heat to add additional amount but not excessive amount.    Copy sent to Dr Gwenlyn Found for review.

## 2021-08-03 ENCOUNTER — Ambulatory Visit (INDEPENDENT_AMBULATORY_CARE_PROVIDER_SITE_OTHER): Payer: Medicare Other

## 2021-08-03 DIAGNOSIS — Z95810 Presence of automatic (implantable) cardiac defibrillator: Secondary | ICD-10-CM

## 2021-08-03 DIAGNOSIS — I428 Other cardiomyopathies: Secondary | ICD-10-CM

## 2021-08-03 NOTE — Progress Notes (Signed)
EPIC Encounter for ICM Monitoring  Patient Name: Jeffery Thomas is a 70 y.o. male Date: 08/03/2021 Primary Care Physican: Libby Maw, MD Primary Cardiologist: Gwenlyn Found Electrophysiologist: Allred 05/12/2021 Weight: 200 lbs                                               Transmission reviewed.    Corvue Thoracic impedance suggesting fluid levels returned to normal after discussing with patient about limiting fluid intake.    Prescribed: Spironolactone 25 mg take 1 tablet daily       Labs: 09/19/2019 Creatinine 0.91, BUN 20, Potassium 4.3, Sodium 131, GFR >60 09/12/2019 Creatinine 0.92, BUN 21, Potassium 4.8, Sodium 139, GFR >60 05/02/2019 Creatinine 1.02, BUN 16, Potassium 4.6, Sodium 136, GFR >60 A complete set of results can be found in Results Review.   Recommendations:  No changes   Follow-up plan: ICM clinic phone appointment on 08/30/2021.  91 day device clinic remote transmission 10/06/2021.      EP/Cardiology Office Visits:  Recall 02/20/2021 with Dr Rayann Heman.  09/24/2021 with Dr Gwenlyn Found.    Copy of ICM check sent to Dr. Rayann Heman.   3 month Direct Trend View ICM trend: 08/03/2021.     Rosalene Billings, RN 08/03/2021 7:50 AM

## 2021-08-13 ENCOUNTER — Other Ambulatory Visit: Payer: Self-pay | Admitting: Family Medicine

## 2021-08-13 DIAGNOSIS — K219 Gastro-esophageal reflux disease without esophagitis: Secondary | ICD-10-CM

## 2021-08-30 ENCOUNTER — Ambulatory Visit (INDEPENDENT_AMBULATORY_CARE_PROVIDER_SITE_OTHER): Payer: Medicare Other

## 2021-08-30 DIAGNOSIS — I428 Other cardiomyopathies: Secondary | ICD-10-CM | POA: Diagnosis not present

## 2021-08-30 DIAGNOSIS — Z95810 Presence of automatic (implantable) cardiac defibrillator: Secondary | ICD-10-CM | POA: Diagnosis not present

## 2021-08-31 ENCOUNTER — Telehealth: Payer: Self-pay

## 2021-08-31 NOTE — Telephone Encounter (Signed)
Remote ICM transmission received.  Attempted call to patient regarding ICM remote transmission and left detailed message per DPR.  Advised to return call for any fluid symptoms or questions. Next ICM remote transmission scheduled 09/06/2021.    

## 2021-08-31 NOTE — Progress Notes (Signed)
EPIC Encounter for ICM Monitoring  Patient Name: Jeffery Thomas is a 70 y.o. male Date: 08/31/2021 Primary Care Physican: Libby Maw, MD Primary Cardiologist: Gwenlyn Found Electrophysiologist: Allred 05/12/2021 Weight: 200 lbs                                               Attempted call to patient and unable to reach.  Left detailed message per DPR regarding transmission. Transmission reviewed.    Corvue Thoracic impedance suggesting possible dryness starting 10/19.  Impedance suggesting possible fluid accumulation 10/9-10/18.    Prescribed: Spironolactone 25 mg take 1 tablet daily       Labs: 09/19/2019 Creatinine 0.91, BUN 20, Potassium 4.3, Sodium 131, GFR >60 09/12/2019 Creatinine 0.92, BUN 21, Potassium 4.8, Sodium 139, GFR >60 05/02/2019 Creatinine 1.02, BUN 16, Potassium 4.6, Sodium 136, GFR >60 A complete set of results can be found in Results Review.   Recommendations:  Left voice mail with ICM number and encouraged to call if experiencing any fluid symptoms.   Follow-up plan: ICM clinic phone appointment on 09/06/2021 (manual) recheck fluid levels.  91 day device clinic remote transmission 10/06/2021.      EP/Cardiology Office Visits:  Recall 02/20/2021 with Dr Rayann Heman.  09/24/2021 with Dr Gwenlyn Found.    Copy of ICM check sent to Dr. Rayann Heman.  Will send to Dr Gwenlyn Found for review if patient is reached.   3 month ICM trend: 08/30/2021.    1 Year ICM trend:       Rosalene Billings, RN 08/31/2021 8:08 AM

## 2021-09-06 ENCOUNTER — Ambulatory Visit (INDEPENDENT_AMBULATORY_CARE_PROVIDER_SITE_OTHER): Payer: Medicare Other

## 2021-09-06 DIAGNOSIS — I428 Other cardiomyopathies: Secondary | ICD-10-CM

## 2021-09-06 DIAGNOSIS — Z95810 Presence of automatic (implantable) cardiac defibrillator: Secondary | ICD-10-CM

## 2021-09-07 NOTE — Progress Notes (Signed)
EPIC Encounter for ICM Monitoring  Patient Name: Jeffery Thomas is a 70 y.o. male Date: 09/07/2021 Primary Care Physican: Libby Maw, MD Primary Cardiologist: Gwenlyn Found Electrophysiologist: Allred 05/12/2021 Weight: 200 lbs                                               Transmission reviewed.    Corvue Thoracic impedance suggesting fluid levels returned to normal after episode of dryness from 10/19-10/28.  Impedance suggesting possible fluid accumulation 10/9-10/18.    Prescribed: Spironolactone 25 mg take 1 tablet daily       Labs: 09/19/2019 Creatinine 0.91, BUN 20, Potassium 4.3, Sodium 131, GFR >60 09/12/2019 Creatinine 0.92, BUN 21, Potassium 4.8, Sodium 139, GFR >60 05/02/2019 Creatinine 1.02, BUN 16, Potassium 4.6, Sodium 136, GFR >60 A complete set of results can be found in Results Review.   Recommendations:  None.   Follow-up plan: ICM clinic phone appointment on 10/11/2021.  91 day device clinic remote transmission 10/06/2021.      EP/Cardiology Office Visits:  Recall 02/20/2021 with Dr Rayann Heman.  09/24/2021 with Dr Gwenlyn Found.    Copy of ICM check sent to Dr. Rayann Heman.   3 month ICM trend: 09/06/2021.    Rosalene Billings, RN 09/07/2021 10:42 AM

## 2021-09-15 ENCOUNTER — Other Ambulatory Visit: Payer: Self-pay | Admitting: Family Medicine

## 2021-09-24 ENCOUNTER — Other Ambulatory Visit: Payer: Self-pay

## 2021-09-24 ENCOUNTER — Encounter: Payer: Self-pay | Admitting: Cardiovascular Disease

## 2021-09-24 ENCOUNTER — Ambulatory Visit (INDEPENDENT_AMBULATORY_CARE_PROVIDER_SITE_OTHER): Payer: Medicare Other | Admitting: Cardiovascular Disease

## 2021-09-24 VITALS — BP 142/88 | HR 55 | Ht 72.0 in | Wt 220.8 lb

## 2021-09-24 DIAGNOSIS — E782 Mixed hyperlipidemia: Secondary | ICD-10-CM

## 2021-09-24 DIAGNOSIS — I10 Essential (primary) hypertension: Secondary | ICD-10-CM

## 2021-09-24 DIAGNOSIS — I4901 Ventricular fibrillation: Secondary | ICD-10-CM | POA: Diagnosis not present

## 2021-09-24 DIAGNOSIS — I428 Other cardiomyopathies: Secondary | ICD-10-CM

## 2021-09-24 NOTE — Patient Instructions (Signed)
Medication Instructions:  Your physician recommends that you continue on your current medications as directed. Please refer to the Current Medication list given to you today.  *If you need a refill on your cardiac medications before your next appointment, please call your pharmacy*   Follow-Up: At [facility], you and your health needs are our priority.  As part of our continuing mission to provide you with exceptional heart care, we have created designated Provider Care Teams.  These Care Teams include your primary Cardiologist (physician) and Advanced Practice Providers (APPs -  Physician Assistants and Nurse Practitioners) who all work together to provide you with the care you need, when you need it.  We recommend signing up for the patient portal called "MyChart".  Sign up information is provided on this After Visit Summary.  MyChart is used to connect with patients for Virtual Visits (Telemedicine).  Patients are able to view lab/test results, encounter notes, upcoming appointments, etc.  Non-urgent messages can be sent to your provider as well.   To learn more about what you can do with MyChart, go to [email or web address].    Your next appointment:   12 month(s)  The format for your next appointment:   In Person  Provider:   Quay Burow, MD {  Other Instructions Pt needs an appointment with Dr. Crissie Sickles.

## 2021-09-24 NOTE — Assessment & Plan Note (Signed)
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  I performed urgent cardiac catheterization on him revealing normal coronary arteries and severe LV dysfunction consistent with "Takotsubo syndrome".  He had an ICD placed for secondary prevention, I had early battery failure and replacement by Dr. Lovena Le.  He has had no chest discharges.  Ejection fraction ultimately normalized by 2D echo performed 09/10/2019.  He is on guideline directed optimal medical therapy.

## 2021-09-24 NOTE — Assessment & Plan Note (Signed)
History of essential hypertension a blood pressure measured today 150/90.  He is on losartan and carvedilol.

## 2021-09-24 NOTE — Assessment & Plan Note (Signed)
History of hyperlipidemia on statin therapy with lipid profile performed 09/23/2020 revealing total cholesterol 145, LDL of 88 and HDL 38.

## 2021-09-24 NOTE — Progress Notes (Signed)
09/24/2021 Jeffery Thomas   11/17/1950  [id number]  Primary Physician Libby Maw, MD Primary Cardiologist: Lorretta Harp MD FACP, [place], [place], [place]  HPI:  Jeffery Thomas is a 70 y.o.  married, father of one with no grandchildren who has not worked the last 10 years a and who I last saw in the office 09/23/2020.  He had witnessed cardiac death on the evening of 26-Jul-2017. He had fairly immediate CPR after the EMS was called and was shocked 3 times. He had CPR and had R OSC in 20 minutes. 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 8 days later he had an ICD implanted by Dr. Rayann Heman for secondary prevention. He was discharged home from the hospital 3 weeks ago. Prior to his presentation he did have a history of treated hypertension and hyperlipidemia. His mother died of a myocardial infarction at age 65. He does smoke cigarettes or playing golf and prior to this drank several hard drinks of liquor a night. Since that time his drinking and has been markedly limited. He has no recollection of the event or one week prior to the event. He has no neurologic deficits otherwise.     He has had an ICD implanted by Dr. Rayann Heman September 2018 for secondary prevention. 2-D echo performed 10/09/17 showed complete normalization of LV function.  He had his ICD generator explanted because of early battery depletion by Dr. Lovena Le 07/20/2018 with a new device installed.  Is followed by Dr. Rayann Heman as an outpatient.  He is not had an ICD discharge.  He denies chest pain or shortness of breath.  He does occasionally smoke a cigar when playing golf.  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 Wynelle Link.   Since I saw him 12 months ago he continues to do well.  He gained some of the weight back that he had been originally lost.  He still  fairly active.  He denies chest pain or shortness of breath.  He has had no ICD discharges.  His 2D echo performed 09/10/2019 showed normal LV systolic function.  Current Meds  Medication Sig   atorvastatin (LIPITOR) 20 MG tablet TAKE 1 TABLET DAILY   carvedilol (COREG) 12.5 MG tablet Take 1 tablet (12.5 mg total) by mouth 2 (two) times daily with a meal.   losartan (COZAAR) 100 MG tablet TAKE 1 TABLET DAILY (NEED APPOINTMENT FOR FUTURE REFILLS)   meloxicam (MOBIC) 15 MG tablet TAKE 1 TABLET DAILY   montelukast (SINGULAIR) 10 MG tablet Take 1 tablet (10 mg total) by mouth daily.   omeprazole (PRILOSEC) 40 MG capsule Take 1 capsule (40 mg total) by mouth daily. **Needs appointment before anymore refills given**   spironolactone (ALDACTONE) 25 MG tablet TAKE 1 TABLET DAILY     No Known Allergies  Social History   Socioeconomic History   Marital status: Married    Spouse name: Not on file   Number of children: Not on file   Years of education: Not on file   Highest education level: Not on file  Occupational History   Not on file  Tobacco Use   Smoking status: Former    Types: Cigars    Quit date: 2017-07-26    Years since quitting: 4.2   Smokeless tobacco: Never  Vaping Use   Vaping Use: Never used  Substance and Sexual Activity  Alcohol use: Yes    Alcohol/week: 6.0 standard drinks    Types: 6 Shots of liquor per week   Drug use: Never   Sexual activity: Yes  Other Topics Concern   Not on file  Social History Narrative   Not on file   Social Determinants of Health   Financial Resource Strain: Low Risk    Difficulty of Paying Living Expenses: Not hard at all  Food Insecurity: No Food Insecurity   Worried About Charity fundraiser in the Last Year: Never true   [place] in the Last Year: Never true  Transportation Needs: No Transportation Needs   Lack of Transportation (Medical): No   Lack of Transportation (Non-Medical): No  Physical Activity: Unknown   Days  of Exercise per Week: Not on file   Minutes of Exercise per Session: 50 min  Stress: No Stress Concern Present   Feeling of Stress : Not at all  Social Connections: Moderately Integrated   Frequency of Communication with Friends and Family: Twice a week   Frequency of Social Gatherings with Friends and Family: More than three times a week   Attends Religious Services: Never   Marine scientist or Organizations: Yes   Attends Music therapist: More than 4 times per year   Marital Status: Married  Human resources officer Violence: Not At Risk   Fear of Current or Ex-Partner: No   Emotionally Abused: No   Physically Abused: No   Sexually Abused: No     Review of Systems: General: negative for chills, fever, night sweats or weight changes.  Cardiovascular: negative for chest pain, dyspnea on exertion, edema, orthopnea, palpitations, paroxysmal nocturnal dyspnea or shortness of breath Dermatological: negative for rash Respiratory: negative for cough or wheezing Urologic: negative for hematuria Abdominal: negative for nausea, vomiting, diarrhea, bright red blood per rectum, melena, or hematemesis Neurologic: negative for visual changes, syncope, or dizziness All other systems reviewed and are otherwise negative except as noted above.    Blood pressure (!) 150/90, pulse (!) 55, height 6' (1.829 m), weight 220 lb 12.8 oz (100.2 kg), SpO2 98 %.  General appearance: alert and no distress Neck: no adenopathy, no carotid bruit, no JVD, supple, symmetrical, trachea midline, and thyroid not enlarged, symmetric, no tenderness/mass/nodules Lungs: clear to auscultation bilaterally Heart: regular rate and rhythm, S1, S2 normal, no murmur, click, rub or gallop Extremities: extremities normal, atraumatic, no cyanosis or edema Pulses: 2+ and symmetric Skin: Skin color, texture, turgor normal. No rashes or lesions Neurologic: Grossly normal  EKG sinus bradycardia 55 without ST or T wave  changes.  I personally reviewed this EKG.  ASSESSMENT AND PLAN:   Ventricular fibrillation (HCC) History of ventricular fibrillation/witnessed sudden cardiac death with immediate CPR and defibrillation, ROSC of 20 minutes.  I performed urgent cardiac catheterization on him revealing normal coronary arteries and severe LV dysfunction consistent with "Takotsubo syndrome".  He had an ICD placed for secondary prevention, I had early battery failure and replacement by Dr. Lovena Le.  He has had no chest discharges.  Ejection fraction ultimately normalized by 2D echo performed 09/10/2019.  He is on guideline directed optimal medical therapy.  Benign essential HTN History of essential hypertension a blood pressure measured today 150/90.  He is on losartan and carvedilol.  Hyperlipidemia History of hyperlipidemia on statin therapy with lipid profile performed 09/23/2020 revealing total cholesterol 145, LDL of 88 and HDL 38.     Lorretta Harp MD [place], [facility]  09/24/2021 11:56 AM

## 2021-09-29 ENCOUNTER — Other Ambulatory Visit: Payer: Self-pay | Admitting: Cardiovascular Disease

## 2021-10-06 ENCOUNTER — Ambulatory Visit (INDEPENDENT_AMBULATORY_CARE_PROVIDER_SITE_OTHER): Payer: Medicare Other

## 2021-10-06 DIAGNOSIS — I428 Other cardiomyopathies: Secondary | ICD-10-CM

## 2021-10-07 ENCOUNTER — Other Ambulatory Visit: Payer: Self-pay | Admitting: Cardiovascular Disease

## 2021-10-08 ENCOUNTER — Encounter: Payer: Self-pay | Admitting: Internal Medicine

## 2021-10-08 LAB — CUP PACEART REMOTE DEVICE CHECK
Battery Remaining Longevity: 77 mo
Battery Remaining Percentage: 73 %
Battery Voltage: 2.98 V
Brady Statistic RV Percent Paced: 1 %
Date Time Interrogation Session: 20221202022250
HighPow Impedance: 81 Ohm
HighPow Impedance: 81 Ohm
Implantable Lead Implant Date: 20180828
Implantable Lead Location: 753860
Implantable Pulse Generator Implant Date: 20190915
Lead Channel Impedance Value: 390 Ohm
Lead Channel Pacing Threshold Amplitude: 0.5 V
Lead Channel Pacing Threshold Pulse Width: 0.5 ms
Lead Channel Sensing Intrinsic Amplitude: 11.9 mV
Lead Channel Setting Pacing Amplitude: 2.5 V
Lead Channel Setting Pacing Pulse Width: 0.5 ms
Lead Channel Setting Sensing Sensitivity: 0.5 mV
Pulse Gen Serial Number: 9851932

## 2021-10-11 ENCOUNTER — Ambulatory Visit (INDEPENDENT_AMBULATORY_CARE_PROVIDER_SITE_OTHER): Payer: Medicare Other

## 2021-10-11 DIAGNOSIS — I428 Other cardiomyopathies: Secondary | ICD-10-CM

## 2021-10-11 DIAGNOSIS — Z95810 Presence of automatic (implantable) cardiac defibrillator: Secondary | ICD-10-CM

## 2021-10-12 ENCOUNTER — Other Ambulatory Visit: Payer: Self-pay | Admitting: Family Medicine

## 2021-10-12 DIAGNOSIS — K219 Gastro-esophageal reflux disease without esophagitis: Secondary | ICD-10-CM

## 2021-10-13 ENCOUNTER — Other Ambulatory Visit: Payer: Self-pay

## 2021-10-13 NOTE — Progress Notes (Signed)
EPIC Encounter for ICM Monitoring  Patient Name: Jeffery Thomas is a 70 y.o. male Date: 10/13/2021 Primary Care Physican: Libby Maw, MD Primary Cardiologist: Gwenlyn Found Electrophysiologist: Allred 09/24/2021 Weight: 220 lbs                                               Spoke with patient and heart failure questions reviewed.  Pt asymptomatic for fluid accumulation and feeling well.   Corvue Thoracic impedance normal but was suggesting some days with possible fluid accumulation in the last month.    Prescribed: Spironolactone 25 mg take 1 tablet daily       Labs: 09/19/2019 Creatinine 0.91, BUN 20, Potassium 4.3, Sodium 131, GFR >60 09/12/2019 Creatinine 0.92, BUN 21, Potassium 4.8, Sodium 139, GFR >60 05/02/2019 Creatinine 1.02, BUN 16, Potassium 4.6, Sodium 136, GFR >60 A complete set of results can be found in Results Review.   Recommendations:  No changes and encouraged to call if experiencing any fluid symptoms.   Follow-up plan: ICM clinic phone appointment on 11/15/2021.  91 day device clinic remote transmission 01/05/2022.      EP/Cardiology Office Visits:  Recall 02/20/2021 with Dr Rayann Heman.     Copy of ICM check sent to Dr. Rayann Heman.   3 month ICM trend: 10/11/2021.    12-14 Month ICM trend:       Rosalene Billings, RN 10/13/2021 4:47 PM

## 2021-10-14 ENCOUNTER — Encounter: Payer: Self-pay | Admitting: Family Medicine

## 2021-10-14 ENCOUNTER — Ambulatory Visit (INDEPENDENT_AMBULATORY_CARE_PROVIDER_SITE_OTHER): Payer: Medicare Other | Admitting: Family Medicine

## 2021-10-14 VITALS — BP 156/92 | HR 55 | Temp 97.0°F | Ht 72.0 in | Wt 221.8 lb

## 2021-10-14 DIAGNOSIS — I10 Essential (primary) hypertension: Secondary | ICD-10-CM

## 2021-10-14 DIAGNOSIS — Z1159 Encounter for screening for other viral diseases: Secondary | ICD-10-CM | POA: Insufficient documentation

## 2021-10-14 DIAGNOSIS — Z8674 Personal history of sudden cardiac arrest: Secondary | ICD-10-CM

## 2021-10-14 DIAGNOSIS — Z23 Encounter for immunization: Secondary | ICD-10-CM | POA: Diagnosis not present

## 2021-10-14 DIAGNOSIS — Z125 Encounter for screening for malignant neoplasm of prostate: Secondary | ICD-10-CM | POA: Diagnosis not present

## 2021-10-14 DIAGNOSIS — E7800 Pure hypercholesterolemia, unspecified: Secondary | ICD-10-CM | POA: Diagnosis not present

## 2021-10-14 DIAGNOSIS — Z959 Presence of cardiac and vascular implant and graft, unspecified: Secondary | ICD-10-CM

## 2021-10-14 DIAGNOSIS — Z0000 Encounter for general adult medical examination without abnormal findings: Secondary | ICD-10-CM

## 2021-10-14 DIAGNOSIS — R7303 Prediabetes: Secondary | ICD-10-CM | POA: Diagnosis not present

## 2021-10-14 DIAGNOSIS — K219 Gastro-esophageal reflux disease without esophagitis: Secondary | ICD-10-CM

## 2021-10-14 LAB — LIPID PANEL
Cholesterol: 149 mg/dL (ref 0–200)
HDL: 36.9 mg/dL — ABNORMAL LOW (ref >39.00)
LDL Cholesterol: 88 mg/dL (ref 0–99)
NonHDL: 112.07
Total CHOL/HDL Ratio: 4
Triglycerides: 118 mg/dL (ref 0.0–149.0)
VLDL: 23.6 mg/dL (ref 0.0–40.0)

## 2021-10-14 LAB — URINALYSIS, ROUTINE W REFLEX MICROSCOPIC
Bilirubin Urine: NEGATIVE
Hgb urine dipstick: NEGATIVE
Ketones, ur: NEGATIVE
Leukocytes,Ua: NEGATIVE
Nitrite: NEGATIVE
RBC / HPF: NONE SEEN (ref 0–?)
Specific Gravity, Urine: 1.025 (ref 1.000–1.030)
Total Protein, Urine: NEGATIVE
Urine Glucose: NEGATIVE
Urobilinogen, UA: 0.2 (ref 0.0–1.0)
pH: 6 (ref 5.0–8.0)

## 2021-10-14 LAB — COMPREHENSIVE METABOLIC PANEL
ALT: 27 U/L (ref 0–53)
AST: 17 U/L (ref 0–37)
Albumin: 4.1 g/dL (ref 3.5–5.2)
Alkaline Phosphatase: 50 U/L (ref 39–117)
BUN: 19 mg/dL (ref 6–23)
CO2: 24 mEq/L (ref 19–32)
Calcium: 9.2 mg/dL (ref 8.4–10.5)
Chloride: 105 mEq/L (ref 96–112)
Creatinine, Ser: 1.07 mg/dL (ref 0.40–1.50)
GFR: 71.36 mL/min (ref >60.00)
Glucose, Bld: 115 mg/dL — ABNORMAL HIGH (ref 70–99)
Potassium: 4.7 mEq/L (ref 3.5–5.1)
Sodium: 137 mEq/L (ref 135–145)
Total Bilirubin: 0.7 mg/dL (ref 0.2–1.2)
Total Protein: 6.5 g/dL (ref 6.0–8.3)

## 2021-10-14 LAB — PSA: PSA: 1.36 ng/mL (ref 0.10–4.00)

## 2021-10-14 LAB — HEMOGLOBIN A1C: Hgb A1c MFr Bld: 6.4 % (ref 4.6–6.5)

## 2021-10-14 LAB — LDL CHOLESTEROL, DIRECT: Direct LDL: 97 mg/dL

## 2021-10-14 LAB — CBC
HCT: 46.6 % (ref 39.0–52.0)
Hemoglobin: 15.2 g/dL (ref 13.0–17.0)
MCHC: 32.7 g/dL (ref 30.0–36.0)
MCV: 89 fl (ref 78.0–100.0)
Platelets: 203 10*3/uL (ref 150.0–400.0)
RBC: 5.24 Mil/uL (ref 4.22–5.81)
RDW: 13.5 % (ref 11.5–15.5)
WBC: 5.5 10*3/uL (ref 4.0–10.5)

## 2021-10-14 MED ORDER — OMEPRAZOLE 40 MG PO CPDR
40.0000 mg | DELAYED_RELEASE_CAPSULE | Freq: Every day | ORAL | 1 refills | Status: DC
Start: 2021-10-14 — End: 2021-12-08

## 2021-10-14 NOTE — Progress Notes (Addendum)
Established Patient Office Visit  Subjective:  Patient ID: Jeffery Thomas, male    DOB: Jun 06, 1951  Age: 70 y.o. MRN: [id number]  CC:  Chief Complaint  Patient presents with   Follow-up    Refill/follow up on medication. Patient fasting.     HPI Jeffery Thomas presents for a physical exam and follow-up of hypertension elevated cholesterol prediabetes and GERD.  He ran out of omeprazole a few days ago and developed indigestion after stopping it.  Needs a refill.  It is worked well for him.  Blood pressure at home has been running in the 120s over 80s he assures me.  Continues with atorvastatin without issue.  Significant history of sudden cardiac death, acute systolic heart failure and ventricular fibrillation.  He does have an implanted defibrillator.  Retired 12 years from the [employer].  He stays busy around his house playing golf.  Colonoscopy is due in 2 years.  Brother and father both had diabetes.  Past Medical History:  Diagnosis Date   AICD (automatic cardioverter/defibrillator) present    Chronic systolic CHF (congestive heart failure) (HCC)    Gastroesophageal reflux disease    History of alcohol abuse    quit 08/ 20/ 2018   History of cardiac arrest    History of encephalopathy 07/05/2017   anoxic-ischemic    History of ST elevation myocardial infarction (STEMI) 06/26/2017   sudden cardiac arrest /  per cardiac cath 06-26-2017 normal coronaries, ef <20% , severe LVSD; dx takatsuki syndrome   History of sudden cardiac arrest 06/26/2017   VF arrest / STEMI    Hypertension    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   premature generator change 07-22-2018--  followed by dr allred  ([facility] [place])   Inguinal hernia    left   Left hydrocele    Left inguinal hernia    Left inguinal hernia 05/07/2019   Myocardial infarction [facility])    NICM (nonischemic cardiomyopathy) ([place])    06-26-2017  per cardiac cath , ef <20% (echo 35-40%)  ;  last echo  10-09-2014 ef 60-65%   OSA (obstructive sleep apnea)    03-26-2019 per pt had sleep study after heart attack, was told no recommendation , but did stop smoking and alcohol   Pre-diabetes    denies   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   sudden cardiac arrest w/ VF and STEMI,  s/p  ICD 07-04-2017    Past Surgical History:  Procedure Laterality Date   CATARACT EXTRACTION W/ INTRAOCULAR LENS IMPLANT Left 2017   COLONOSCOPY     HYDROCELE EXCISION Left 04/02/2019   Procedure: HYDROCELECTOMY ADULT;  Surgeon: Festus Aloe, MD;  Location: [facility];  Service: Urology;  Laterality: Left;   ICD GENERATOR CHANGEOUT N/A 07/22/2018   Procedure: [place];  Surgeon: Evans Lance, MD;  Location: [place] [facility];  Service: Cardiovascular;  Laterality: N/A;   ICD IMPLANT N/A 07/04/2017   SJM Fortify Assura VR ICD implanted by Dr Rayann Heman for secondary prevention after VF arrest   INGUINAL HERNIA REPAIR Left 05/07/2019   Procedure: OPEN REPAIR LEFT INGUINAL HERNIA WITH MESH;  Surgeon: Fanny Skates, MD;  Location: [place];  Service: General;  Laterality: Left;   LEFT HEART CATH AND CORONARY ANGIOGRAPHY N/A 06/26/2017   Procedure: LEFT HEART CATH AND CORONARY ANGIOGRAPHY;  Surgeon: Lorretta Harp, MD;  Location: [place] [facility];  Service: Cardiovascular;  Laterality: N/A;   MENISCUS REPAIR Left 1983   left  knee   TOTAL HIP ARTHROPLASTY Right 09/18/2019   Procedure: TOTAL HIP ARTHROPLASTY ANTERIOR APPROACH;  Surgeon: Gaynelle Arabian, MD;  Location: [facility];  Service: Orthopedics;  Laterality: Right;   TOTAL KNEE ARTHROPLASTY Left 2009  approx.   TOTAL KNEE REVISION Left 04/18/2018   Procedure: LEFT TOTAL KNEE REVISION;  Surgeon: Gaynelle Arabian, MD;  Location: [facility];  Service: Orthopedics;  Laterality: Left;  Adductor Block    Family History  Problem Relation Age of Onset   Hypertension Mother    Diabetes Father    Cancer Brother        pancreatic    Social History    Socioeconomic History   Marital status: Married    Spouse name: Not on file   Number of children: Not on file   Years of education: Not on file   Highest education level: Not on file  Occupational History   Not on file  Tobacco Use   Smoking status: Former    Types: Cigars    Quit date: 06/26/2017    Years since quitting: 4.3   Smokeless tobacco: Never  Vaping Use   Vaping Use: Never used  Substance and Sexual Activity   Alcohol use: Yes    Alcohol/week: 6.0 standard drinks    Types: 6 Shots of liquor per week   Drug use: Never   Sexual activity: Yes  Other Topics Concern   Not on file  Social History Narrative   Not on file   Social Determinants of Health   Financial Resource Strain: Low Risk    Difficulty of Paying Living Expenses: Not hard at all  Food Insecurity: No Food Insecurity   Worried About Charity fundraiser in the Last Year: Never true   [place] in the Last Year: Never true  Transportation Needs: No Transportation Needs   Lack of Transportation (Medical): No   Lack of Transportation (Non-Medical): No  Physical Activity: Unknown   Days of Exercise per Week: Not on file   Minutes of Exercise per Session: 50 min  Stress: No Stress Concern Present   Feeling of Stress : Not at all  Social Connections: Moderately Integrated   Frequency of Communication with Friends and Family: Twice a week   Frequency of Social Gatherings with Friends and Family: More than three times a week   Attends Religious Services: Never   Marine scientist or Organizations: Yes   Attends Music therapist: More than 4 times per year   Marital Status: Married  Human resources officer Violence: Not At Risk   Fear of Current or Ex-Partner: No   Emotionally Abused: No   Physically Abused: No   Sexually Abused: No    Outpatient Medications Prior to Visit  Medication Sig Dispense Refill   atorvastatin (LIPITOR) 20 MG tablet TAKE 1 TABLET DAILY 90 tablet 3    carvedilol (COREG) 12.5 MG tablet Take 1 tablet (12.5 mg total) by mouth 2 (two) times daily with a meal. 180 tablet 3   losartan (COZAAR) 100 MG tablet TAKE 1 TABLET DAILY (NEED APPOINTMENT FOR FUTURE REFILLS) 90 tablet 3   meloxicam (MOBIC) 15 MG tablet TAKE 1 TABLET DAILY 30 tablet 2   spironolactone (ALDACTONE) 25 MG tablet TAKE 1 TABLET DAILY 90 tablet 3   omeprazole (PRILOSEC) 40 MG capsule Take 1 capsule (40 mg total) by mouth daily. **Needs appointment before anymore refills given** 30 capsule 0   amoxicillin (AMOXIL) 500 MG tablet amoxicillin 500  mg tablet  4 tablets 1 hour prior to dental procedure     montelukast (SINGULAIR) 10 MG tablet Take 1 tablet (10 mg total) by mouth daily. (Patient not taking: Reported on 10/14/2021) 90 tablet 2   No facility-administered medications prior to visit.    No Known Allergies  ROS Review of Systems  Constitutional:  Negative for diaphoresis, fatigue, fever and unexpected weight change.  HENT: Negative.    Eyes:  Negative for photophobia and visual disturbance.  Respiratory: Negative.  Negative for shortness of breath.   Cardiovascular: Negative.  Negative for chest pain and palpitations.  Gastrointestinal: Negative.   Endocrine: Negative for polyphagia.  Genitourinary:  Negative for difficulty urinating, frequency and urgency.  Musculoskeletal:  Negative for gait problem and joint swelling.  Neurological:  Negative for speech difficulty and weakness.  Hematological:  Does not bruise/bleed easily.  Psychiatric/Behavioral: Negative.    Depression screen [facility] 2/9 10/14/2021 06/29/2021 06/04/2020  Decreased Interest 0 0 0  Down, Depressed, Hopeless 0 0 0  PHQ - 2 Score 0 0 0       Objective:    Physical Exam Vitals and nursing note reviewed.  Constitutional:      General: He is not in acute distress.    Appearance: Normal appearance. He is not ill-appearing, toxic-appearing or diaphoretic.  HENT:     Head: Normocephalic and  atraumatic.     Right Ear: Tympanic membrane, ear canal and external ear normal.     Left Ear: Tympanic membrane, ear canal and external ear normal.     Mouth/Throat:     Mouth: Mucous membranes are moist.     Pharynx: Oropharynx is clear. No oropharyngeal exudate or posterior oropharyngeal erythema.  Eyes:     General: No scleral icterus.       Right eye: No discharge.        Left eye: No discharge.     Extraocular Movements: Extraocular movements intact.     Conjunctiva/sclera: Conjunctivae normal.     Pupils: Pupils are equal, round, and reactive to light.  Neck:     Vascular: No carotid bruit.  Cardiovascular:     Rate and Rhythm: Normal rate and regular rhythm.  Pulmonary:     Effort: Pulmonary effort is normal.     Breath sounds: Normal breath sounds.  Abdominal:     General: Bowel sounds are normal. There is no distension.     Palpations: Abdomen is soft. There is no mass.     Tenderness: There is no abdominal tenderness. There is no guarding or rebound.     Hernia: No hernia is present.  Genitourinary:    Comments: Patient declines exam. Musculoskeletal:     Cervical back: No rigidity or tenderness.     Right lower leg: No edema.     Left lower leg: No edema.  Lymphadenopathy:     Cervical: No cervical adenopathy.  Skin:    General: Skin is warm and dry.  Neurological:     Mental Status: He is alert and oriented to person, place, and time.  Psychiatric:        Mood and Affect: Mood normal.        Behavior: Behavior normal.    BP (!) 156/92 (BP Location: Left Arm, Patient Position: Sitting, Cuff Size: Large)   Pulse (!) 55   Temp (!) 97 F (36.1 C) (Temporal)   Ht 6' (1.829 m)   Wt 221 lb 12.8 oz (100.6 kg)   SpO2 95%  BMI 30.08 kg/m  Wt Readings from Last 3 Encounters:  10/14/21 221 lb 12.8 oz (100.6 kg)  09/24/21 220 lb 12.8 oz (100.2 kg)  09/23/20 206 lb (93.4 kg)     Health Maintenance Due  Topic Date Due   Hepatitis C Screening  Never done    Zoster Vaccines- Shingrix (1 of 2) Never done   Pneumonia Vaccine 12+ Years old (2 - PPSV23 if available, else PCV20) 07/18/2019   COVID-19 Vaccine (3 - Pfizer risk series) 02/24/2020    There are no preventive care reminders to display for this patient.  Lab Results  Component Value Date   TSH 2.40 07/17/2018   Lab Results  Component Value Date   WBC 5.5 10/14/2021   HGB 15.2 10/14/2021   HCT 46.6 10/14/2021   MCV 89.0 10/14/2021   PLT 203.0 10/14/2021   Lab Results  Component Value Date   NA 137 10/14/2021   K 4.7 10/14/2021   CO2 24 10/14/2021   GLUCOSE 115 (H) 10/14/2021   BUN 19 10/14/2021   CREATININE 1.07 10/14/2021   BILITOT 0.7 10/14/2021   ALKPHOS 50 10/14/2021   AST 17 10/14/2021   ALT 27 10/14/2021   PROT 6.5 10/14/2021   ALBUMIN 4.1 10/14/2021   CALCIUM 9.2 10/14/2021   ANIONGAP 10 09/19/2019   GFR 71.36 10/14/2021   Lab Results  Component Value Date   CHOL 149 10/14/2021   Lab Results  Component Value Date   HDL 36.90 (L) 10/14/2021   Lab Results  Component Value Date   LDLCALC 88 10/14/2021   Lab Results  Component Value Date   TRIG 118.0 10/14/2021   Lab Results  Component Value Date   CHOLHDL 4 10/14/2021   Lab Results  Component Value Date   HGBA1C 6.4 10/14/2021      Assessment & Plan:   Problem List Items Addressed This Visit       Cardiovascular and Mediastinum   Benign essential HTN   Relevant Orders   CBC (Completed)   Comprehensive metabolic panel (Completed)   Microalbumin / creatinine urine ratio (Completed)     Digestive   Gastroesophageal reflux disease   Relevant Medications   omeprazole (PRILOSEC) 40 MG capsule     Other   Prediabetes   Relevant Medications   metFORMIN (GLUCOPHAGE XR) 500 MG 24 hr tablet   Other Relevant Orders   Comprehensive metabolic panel (Completed)   Hemoglobin A1c (Completed)   Microalbumin / creatinine urine ratio (Completed)   Cardiac device in situ   History of cardiac  arrest   Relevant Orders   Comprehensive metabolic panel (Completed)   Healthcare maintenance   Relevant Orders   PSA (Completed)   Urinalysis, Routine w reflex microscopic (Completed)   Need for hepatitis C screening test   Relevant Orders   Hepatitis C antibody   Other Visit Diagnoses     Need for influenza vaccination    -  Primary   Relevant Orders   Flu Vaccine QUAD High Dose(Fluad) (Completed)   Need for pneumococcal vaccination       Relevant Orders   Pneumococcal conjugate vaccine 20-valent (Prevnar 20)   Elevated cholesterol       Relevant Orders   LDL cholesterol, direct (Completed)   Lipid panel (Completed)       Meds ordered this encounter  Medications   omeprazole (PRILOSEC) 40 MG capsule    Sig: Take 1 capsule (40 mg total) by mouth daily. **Needs appointment before anymore  refills given**    Dispense:  90 capsule    Refill:  1   metFORMIN (GLUCOPHAGE XR) 500 MG 24 hr tablet    Sig: Take 1 tablet (500 mg total) by mouth at bedtime.    Dispense:  90 tablet    Refill:  1     Follow-up: Return in about 3 months (around 01/12/2022), or check and record BPs and bring cuff to next visit..  We discussed the possibility of adding a diabetes medicine that would probably be metformin or Glucophage pending results of today's hemoglobin A1c.  Libby Maw, MD

## 2021-10-15 LAB — MICROALBUMIN / CREATININE URINE RATIO
Creatinine,U: 145.3 mg/dL
Microalb Creat Ratio: 0.6 mg/g (ref 0.0–30.0)
Microalb, Ur: 0.8 mg/dL (ref 0.0–1.9)

## 2021-10-15 NOTE — Progress Notes (Signed)
Remote ICD transmission.   

## 2021-10-18 LAB — HEPATITIS C ANTIBODY
Hepatitis C Ab: NONREACTIVE
SIGNAL TO CUT-OFF: <0.02 (ref <1.00)

## 2021-10-18 MED ORDER — METFORMIN HCL ER 500 MG PO TB24: 500.0000 mg | ORAL_TABLET | Freq: Every evening | ORAL | 1 refills | Status: DC

## 2021-10-18 NOTE — Addendum Note (Signed)
Addended by: Jon Billings on: 10/18/2021 01:21 PM   Modules accepted: Orders

## 2021-11-03 ENCOUNTER — Other Ambulatory Visit: Payer: Self-pay | Admitting: Cardiovascular Disease

## 2021-11-15 ENCOUNTER — Ambulatory Visit (INDEPENDENT_AMBULATORY_CARE_PROVIDER_SITE_OTHER): Payer: Medicare Other

## 2021-11-15 DIAGNOSIS — Z95810 Presence of automatic (implantable) cardiac defibrillator: Secondary | ICD-10-CM | POA: Diagnosis not present

## 2021-11-15 DIAGNOSIS — I428 Other cardiomyopathies: Secondary | ICD-10-CM

## 2021-11-19 NOTE — Progress Notes (Signed)
EPIC Encounter for ICM Monitoring  Patient Name: Jeffery Thomas is a 71 y.o. male Date: 11/19/2021 Primary Care Physican: Libby Maw, MD Primary Cardiologist: Gwenlyn Found Electrophysiologist: Allred 11/19/2021 Weight: 215-219 lbs                                               Spoke with patient and heart failure questions reviewed.  Pt asymptomatic for fluid accumulation.  Reports feeling well at this time and voices no complaints.    Corvue Thoracic impedance normal but was suggesting possible fluid accumulation from 12/29-1/6.    Prescribed: Spironolactone 25 mg take 1 tablet daily       Labs: 10/14/2021 Creatinine 1.07, BUN 19, Potassium 4.7, Sodium 137, GFR 71.36 09/19/2019 Creatinine 0.91, BUN 20, Potassium 4.3, Sodium 131, GFR >60 09/12/2019 Creatinine 0.92, BUN 21, Potassium 4.8, Sodium 139, GFR >60 05/02/2019 Creatinine 1.02, BUN 16, Potassium 4.6, Sodium 136, GFR >60 A complete set of results can be found in Results Review.   Recommendations:  No changes and encouraged to call if experiencing any fluid symptoms.   Follow-up plan: ICM clinic phone appointment on 12/20/2021.  91 Thomas device clinic remote transmission 01/05/2022.      EP/Cardiology Office Visits:  Recall 02/20/2021 with Dr Rayann Heman.  Recall 09/19/2022 with Dr Gwenlyn Found.   Copy of ICM check sent to Dr. Rayann Heman.   3 month ICM trend: 11/15/2021.    12-14 Month ICM trend:     Rosalene Billings, RN 11/19/2021 8:56 AM

## 2021-12-08 ENCOUNTER — Other Ambulatory Visit: Payer: Self-pay

## 2021-12-08 ENCOUNTER — Encounter: Payer: Self-pay | Admitting: Registered Nurse

## 2021-12-08 ENCOUNTER — Ambulatory Visit (INDEPENDENT_AMBULATORY_CARE_PROVIDER_SITE_OTHER): Payer: Medicare Other | Admitting: Registered Nurse

## 2021-12-08 DIAGNOSIS — K219 Gastro-esophageal reflux disease without esophagitis: Secondary | ICD-10-CM | POA: Diagnosis not present

## 2021-12-08 DIAGNOSIS — R7303 Prediabetes: Secondary | ICD-10-CM | POA: Diagnosis not present

## 2021-12-08 MED ORDER — METFORMIN HCL ER 500 MG PO TB24: 500.0000 mg | ORAL_TABLET | Freq: Every evening | ORAL | 1 refills | Status: DC

## 2021-12-08 MED ORDER — OMEPRAZOLE 40 MG PO CPDR: 40.0000 mg | DELAYED_RELEASE_CAPSULE | Freq: Every day | ORAL | 3 refills | Status: DC

## 2021-12-08 NOTE — Patient Instructions (Addendum)
Mr. Rami -  Doristine Devoid to meet you  Continue meds as prescribed  See you in 3-4 mo for recheck on labs. If normal at that time, can plan on 1 year follow up.  Let me know if concerns arise in the mean time, though you know that I hope they won't,  Rich     If you have lab work done today you will be contacted with your lab results within the next 2 weeks.  If you have not heard from [place] then please contact us. The fastest way to get your results is to register for My Chart.   IF you received an x-ray today, you will receive an invoice from [facility]. Please contact [facility] at [phone] with questions or concerns regarding your invoice.   IF you received labwork today, you will receive an invoice from [facility]. Please contact [facility] at [phone] with questions or concerns regarding your invoice.   Our billing staff will not be able to assist you with questions regarding bills from these companies.  You will be contacted with the lab results as soon as they are available. The fastest way to get your results is to activate your My Chart account. Instructions are located on the last page of this paperwork. If you have not heard from [place] regarding the results in 2 weeks, please contact this office.

## 2021-12-08 NOTE — Progress Notes (Signed)
Established Patient Office Visit  Subjective:  Patient ID: Jeffery Thomas, male    DOB: Jan 24, 1951  Age: 71 y.o. MRN: [id number]  CC:  Chief Complaint  Patient presents with   Transitions Of Care    HPI Jeffery Thomas presents for visit to est care  Has moved closer to this office and would like to be seen here.  Histories reviewed and updated with patient.   Hx of nonischemic cardiomyopathy, chronic systolic chf, cardiac arrest, STEMI, takasuki syndrome, htn. White coat htn - BP cuff at home consistently 120s/80s He is followed by Dr. Gwenlyn Found as his cardiologist Takes carvedilol 12.5mg  po bid ac, losartan 100mg  po qd, atorvastatin 20mg  po qd, spironolactone 25mg  po qd.  Has AICD  Prediabetic- Had been started on metformin 500mg  XR PO qhs by Dr Ethelene Hal after visit on 10/14/21 when A1c had risen to 6.4 He is tolerating this well. Good compliance. No AE.  GERD: Takes omeprazole 40mg  po qd Good effect, no AE. Has tried to go without, notes quick onset of indigestion.   Past Medical History:  Diagnosis Date   AICD (automatic cardioverter/defibrillator) present    Chronic systolic CHF (congestive heart failure) (HCC)    Gastroesophageal reflux disease    History of alcohol abuse    quit 08/ 20/ 2018   History of cardiac arrest    History of encephalopathy 07/05/2017   anoxic-ischemic    History of ST elevation myocardial infarction (STEMI) 06/26/2017   sudden cardiac arrest /  per cardiac cath 06-26-2017 normal coronaries, ef <20% , severe LVSD; dx takatsuki syndrome   History of sudden cardiac arrest 06/26/2017   VF arrest / STEMI    Hypertension    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   premature generator change 07-22-2018--  followed by dr allred  ([facility] [place])   Inguinal hernia    left   Left hydrocele    Left inguinal hernia    Left inguinal hernia 05/07/2019   Myocardial infarction [facility])    NICM (nonischemic cardiomyopathy)  ([place])    06-26-2017  per cardiac cath , ef <20% (echo 35-40%)  ;  last echo 10-09-2014 ef 60-65%   OSA (obstructive sleep apnea)    03-26-2019 per pt had sleep study after heart attack, was told no recommendation , but did stop smoking and alcohol   Pre-diabetes    denies   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   sudden cardiac arrest w/ VF and STEMI,  s/p  ICD 07-04-2017    Past Surgical History:  Procedure Laterality Date   CATARACT EXTRACTION W/ INTRAOCULAR LENS IMPLANT Left 2017   COLONOSCOPY     HYDROCELE EXCISION Left 04/02/2019   Procedure: HYDROCELECTOMY ADULT;  Surgeon: Festus Aloe, MD;  Location: [facility];  Service: Urology;  Laterality: Left;   ICD GENERATOR CHANGEOUT N/A 07/22/2018   Procedure: [place];  Surgeon: Evans Lance, MD;  Location: [place] [facility];  Service: Cardiovascular;  Laterality: N/A;   ICD IMPLANT N/A 07/04/2017   SJM Fortify Assura VR ICD implanted by Dr Rayann Heman for secondary prevention after VF arrest   INGUINAL HERNIA REPAIR Left 05/07/2019   Procedure: OPEN REPAIR LEFT INGUINAL HERNIA WITH MESH;  Surgeon: Fanny Skates, MD;  Location: [place];  Service: General;  Laterality: Left;   LEFT HEART CATH AND CORONARY ANGIOGRAPHY N/A 06/26/2017   Procedure: LEFT HEART CATH AND CORONARY ANGIOGRAPHY;  Surgeon: Lorretta Harp, MD;  Location: [place] [facility];  Service: Cardiovascular;  Laterality: N/A;   MENISCUS REPAIR Left 1983   left knee   TOTAL HIP ARTHROPLASTY Right 09/18/2019   Procedure: TOTAL HIP ARTHROPLASTY ANTERIOR APPROACH;  Surgeon: Gaynelle Arabian, MD;  Location: [facility];  Service: Orthopedics;  Laterality: Right;   TOTAL KNEE ARTHROPLASTY Left 2009  approx.   TOTAL KNEE REVISION Left 04/18/2018   Procedure: LEFT TOTAL KNEE REVISION;  Surgeon: Gaynelle Arabian, MD;  Location: [facility];  Service: Orthopedics;  Laterality: Left;  Adductor Block    Family History  Problem Relation Age of Onset   Hypertension Mother     Diabetes Father    Cancer Brother        pancreatic    Social History   Socioeconomic History   Marital status: Married    Spouse name: Not on file   Number of children: 1   Years of education: Not on file   Highest education level: Not on file  Occupational History   Not on file  Tobacco Use   Smoking status: Former    Types: Cigars    Quit date: 06/26/2017    Years since quitting: 4.4   Smokeless tobacco: Never  Vaping Use   Vaping Use: Never used  Substance and Sexual Activity   Alcohol use: Yes    Alcohol/week: 6.0 standard drinks    Types: 6 Shots of liquor per week   Drug use: Never   Sexual activity: Yes  Other Topics Concern   Not on file  Social History Narrative   Not on file   Social Determinants of Health   Financial Resource Strain: Low Risk    Difficulty of Paying Living Expenses: Not hard at all  Food Insecurity: No Food Insecurity   Worried About Charity fundraiser in the Last Year: Never true   [place] in the Last Year: Never true  Transportation Needs: No Transportation Needs   Lack of Transportation (Medical): No   Lack of Transportation (Non-Medical): No  Physical Activity: Unknown   Days of Exercise per Week: Not on file   Minutes of Exercise per Session: 50 min  Stress: No Stress Concern Present   Feeling of Stress : Not at all  Social Connections: Moderately Integrated   Frequency of Communication with Friends and Family: Twice a week   Frequency of Social Gatherings with Friends and Family: More than three times a week   Attends Religious Services: Never   Marine scientist or Organizations: Yes   Attends Music therapist: More than 4 times per year   Marital Status: Married  Human resources officer Violence: Not At Risk   Fear of Current or Ex-Partner: No   Emotionally Abused: No   Physically Abused: No   Sexually Abused: No    Outpatient Medications Prior to Visit  Medication Sig Dispense Refill    atorvastatin (LIPITOR) 20 MG tablet TAKE 1 TABLET DAILY 90 tablet 3   carvedilol (COREG) 12.5 MG tablet Take 1 tablet (12.5 mg total) by mouth 2 (two) times daily with a meal. 180 tablet 3   losartan (COZAAR) 100 MG tablet Take 1 tablet (100 mg total) by mouth daily. 90 tablet 3   meloxicam (MOBIC) 15 MG tablet TAKE 1 TABLET DAILY 30 tablet 2   spironolactone (ALDACTONE) 25 MG tablet TAKE 1 TABLET DAILY 90 tablet 3   metFORMIN (GLUCOPHAGE XR) 500 MG 24 hr tablet Take 1 tablet (500 mg total) by mouth at bedtime. 90 tablet  1   omeprazole (PRILOSEC) 40 MG capsule Take 1 capsule (40 mg total) by mouth daily. **Needs appointment before anymore refills given** 90 capsule 1   No facility-administered medications prior to visit.    Not on File  ROS Review of Systems  Constitutional: Negative.   HENT: Negative.    Eyes: Negative.   Respiratory: Negative.    Cardiovascular: Negative.   Gastrointestinal: Negative.   Genitourinary: Negative.   Musculoskeletal: Negative.   Skin: Negative.   Neurological: Negative.   Psychiatric/Behavioral: Negative.    All other systems reviewed and are negative.    Objective:    Physical Exam Constitutional:      General: He is not in acute distress.    Appearance: Normal appearance. He is normal weight. He is not ill-appearing, toxic-appearing or diaphoretic.  Cardiovascular:     Rate and Rhythm: Normal rate and regular rhythm.     Heart sounds: Normal heart sounds. No murmur heard.   No friction rub. No gallop.  Pulmonary:     Effort: Pulmonary effort is normal. No respiratory distress.     Breath sounds: Normal breath sounds. No stridor. No wheezing, rhonchi or rales.  Chest:     Chest wall: No tenderness.  Neurological:     General: No focal deficit present.     Mental Status: He is alert and oriented to person, place, and time. Mental status is at baseline.  Psychiatric:        Mood and Affect: Mood normal.        Behavior: Behavior normal.         Thought Content: Thought content normal.        Judgment: Judgment normal.    BP (!) 162/95    Pulse 61    Temp 98.3 F (36.8 C) (Temporal)    Resp 18    Ht 6' (1.829 m)    Wt 216 lb (98 kg)    BMI 29.29 kg/m  Wt Readings from Last 3 Encounters:  12/08/21 216 lb (98 kg)  10/14/21 221 lb 12.8 oz (100.6 kg)  09/24/21 220 lb 12.8 oz (100.2 kg)     Health Maintenance Due  Topic Date Due   Pneumonia Vaccine 90+ Years old (2 - PPSV23 if available, else PCV20) 07/18/2019    There are no preventive care reminders to display for this patient.  Lab Results  Component Value Date   TSH 2.40 07/17/2018   Lab Results  Component Value Date   WBC 5.5 10/14/2021   HGB 15.2 10/14/2021   HCT 46.6 10/14/2021   MCV 89.0 10/14/2021   PLT 203.0 10/14/2021   Lab Results  Component Value Date   NA 137 10/14/2021   K 4.7 10/14/2021   CO2 24 10/14/2021   GLUCOSE 115 (H) 10/14/2021   BUN 19 10/14/2021   CREATININE 1.07 10/14/2021   BILITOT 0.7 10/14/2021   ALKPHOS 50 10/14/2021   AST 17 10/14/2021   ALT 27 10/14/2021   PROT 6.5 10/14/2021   ALBUMIN 4.1 10/14/2021   CALCIUM 9.2 10/14/2021   ANIONGAP 10 09/19/2019   GFR 71.36 10/14/2021   Lab Results  Component Value Date   CHOL 149 10/14/2021   Lab Results  Component Value Date   HDL 36.90 (L) 10/14/2021   Lab Results  Component Value Date   LDLCALC 88 10/14/2021   Lab Results  Component Value Date   TRIG 118.0 10/14/2021   Lab Results  Component Value Date   CHOLHDL 4  10/14/2021   Lab Results  Component Value Date   HGBA1C 6.4 10/14/2021      Assessment & Plan:   Problem List Items Addressed This Visit       Digestive   Gastroesophageal reflux disease   Relevant Medications   omeprazole (PRILOSEC) 40 MG capsule     Other   Prediabetes   Relevant Medications   metFORMIN (GLUCOPHAGE XR) 500 MG 24 hr tablet    Meds ordered this encounter  Medications   omeprazole (PRILOSEC) 40 MG capsule    Sig:  Take 1 capsule (40 mg total) by mouth daily.    Dispense:  90 capsule    Refill:  3    Order Specific Question:   Supervising Provider    Answer:   Carlota Raspberry, JEFFREY R [[id number]]   metFORMIN (GLUCOPHAGE XR) 500 MG 24 hr tablet    Sig: Take 1 tablet (500 mg total) by mouth at bedtime.    Dispense:  90 tablet    Refill:  1    Order Specific Question:   Supervising Provider    Answer:   Carlota Raspberry, JEFFREY R [[id number]]    Follow-up: Return in about 3 months (around 03/07/2022) for prediabetes.   PLAN Continue meds as prescribed.  Follow up in 3-4 mo to recheck A1c. Continue to follow with cardiology as scheduled. Patient encouraged to call clinic with any questions, comments, or concerns.  Jeffery Coss, NP

## 2021-12-20 ENCOUNTER — Telehealth: Payer: Self-pay

## 2021-12-20 ENCOUNTER — Ambulatory Visit (INDEPENDENT_AMBULATORY_CARE_PROVIDER_SITE_OTHER): Payer: Medicare Other

## 2021-12-20 DIAGNOSIS — Z95810 Presence of automatic (implantable) cardiac defibrillator: Secondary | ICD-10-CM | POA: Diagnosis not present

## 2021-12-20 DIAGNOSIS — I428 Other cardiomyopathies: Secondary | ICD-10-CM | POA: Diagnosis not present

## 2021-12-20 NOTE — Progress Notes (Signed)
EPIC Encounter for ICM Monitoring  Patient Name: Jeffery Thomas is a 71 y.o. male Date: 12/20/2021 Primary Care Physican: Maximiano Coss, NP Primary Cardiologist: Gwenlyn Found Electrophysiologist: Allred 11/19/2021 Weight: 215-219 lbs                                               Attempted call to patient and unable to reach.  Left detailed message per DPR regarding transmission. Transmission reviewed.    Corvue Thoracic impedance suggesting possible fluid accumulation starting 2/8.  Impedance decreased and also suggesting possible fluid accumulation from 1/9-1/16 and 1/28-2/1.    Prescribed: Spironolactone 25 mg take 1 tablet daily       Labs: 10/14/2021 Creatinine 1.07, BUN 19, Potassium 4.7, Sodium 137, GFR 71.36 09/19/2019 Creatinine 0.91, BUN 20, Potassium 4.3, Sodium 131, GFR >60 09/12/2019 Creatinine 0.92, BUN 21, Potassium 4.8, Sodium 139, GFR >60 05/02/2019 Creatinine 1.02, BUN 16, Potassium 4.6, Sodium 136, GFR >60 A complete set of results can be found in Results Review.   Recommendations:  Left voice mail with ICM number and encouraged to call if experiencing any fluid symptoms.   Follow-up plan: ICM clinic phone appointment on 12/28/2021 to recheck fluid levels.  91 day device clinic remote transmission 01/05/2022.      EP/Cardiology Office Visits:  Recall 02/20/2021 with Dr Rayann Heman.  Recall 09/19/2022 with Dr Gwenlyn Found.   Copy of ICM check sent to Dr. Rayann Heman.  Will send to Dr Gwenlyn Found for review if patient is reached.  3 month ICM trend: 12/20/2021.    12-14 Month ICM trend:     Rosalene Billings, RN 12/20/2021 3:04 PM

## 2021-12-20 NOTE — Telephone Encounter (Signed)
Remote ICM transmission received.  Attempted call to patient regarding ICM remote transmission and left detailed message per DPR to return call.  Advised to return call for any fluid symptoms or questions. Next ICM remote transmission scheduled 12/28/2021.

## 2021-12-28 ENCOUNTER — Ambulatory Visit (INDEPENDENT_AMBULATORY_CARE_PROVIDER_SITE_OTHER): Payer: Medicare Other

## 2021-12-28 ENCOUNTER — Telehealth: Payer: Self-pay

## 2021-12-28 DIAGNOSIS — Z95810 Presence of automatic (implantable) cardiac defibrillator: Secondary | ICD-10-CM

## 2021-12-28 DIAGNOSIS — I428 Other cardiomyopathies: Secondary | ICD-10-CM

## 2021-12-28 NOTE — Progress Notes (Signed)
EPIC Encounter for ICM Monitoring  Patient Name: Jeffery Thomas is a 71 y.o. male Date: 12/28/2021 Primary Care Physican: Maximiano Coss, NP Primary Cardiologist: Gwenlyn Found Electrophysiologist: Allred 11/19/2021 Weight: 215-219 lbs                                               Attempted call to patient and unable to reach.  Left detailed message per DPR regarding transmission. Transmission reviewed.    Corvue Thoracic impedance suggesting fluid levels returned to normal.    Prescribed: Spironolactone 25 mg take 1 tablet daily       Labs: 10/14/2021 Creatinine 1.07, BUN 19, Potassium 4.7, Sodium 137, GFR 71.36 09/19/2019 Creatinine 0.91, BUN 20, Potassium 4.3, Sodium 131, GFR >60 09/12/2019 Creatinine 0.92, BUN 21, Potassium 4.8, Sodium 139, GFR >60 05/02/2019 Creatinine 1.02, BUN 16, Potassium 4.6, Sodium 136, GFR >60 A complete set of results can be found in Results Review.   Recommendations:  Left voice mail with ICM number and encouraged to call if experiencing any fluid symptoms.   Follow-up plan: ICM clinic phone appointment on 01/24/2022.  91 day device clinic remote transmission 01/05/2022.      EP/Cardiology Office Visits:  Recall 02/20/2021 with Dr Rayann Heman.  Recall 09/19/2022 with Dr Gwenlyn Found.   Copy of ICM check sent to Dr. Rayann Heman.  3 month ICM trend: 12/28/2021.    12-14 Month ICM trend:     Rosalene Billings, RN 12/28/2021 1:38 PM

## 2021-12-28 NOTE — Telephone Encounter (Signed)
Remote ICM transmission received.  Attempted call to patient regarding ICM remote transmission and left detailed message per DPR.  Advised to return call for any fluid symptoms or questions. Next ICM remote transmission scheduled 01/24/2022.   ° ° °

## 2022-01-05 ENCOUNTER — Telehealth: Payer: Self-pay | Admitting: Cardiovascular Disease

## 2022-01-05 ENCOUNTER — Ambulatory Visit (INDEPENDENT_AMBULATORY_CARE_PROVIDER_SITE_OTHER): Payer: Medicare Other

## 2022-01-05 DIAGNOSIS — I4901 Ventricular fibrillation: Secondary | ICD-10-CM

## 2022-01-05 LAB — CUP PACEART REMOTE DEVICE CHECK
Battery Remaining Longevity: 76 mo
Battery Remaining Percentage: 72 %
Battery Voltage: 2.96 V
Brady Statistic RV Percent Paced: 1 %
Date Time Interrogation Session: 20230301025435
HighPow Impedance: 74 Ohm
HighPow Impedance: 74 Ohm
Implantable Lead Implant Date: 20180828
Implantable Lead Location: 753860
Implantable Pulse Generator Implant Date: 20190915
Lead Channel Impedance Value: 350 Ohm
Lead Channel Pacing Threshold Amplitude: 0.5 V
Lead Channel Pacing Threshold Pulse Width: 0.5 ms
Lead Channel Sensing Intrinsic Amplitude: 10.9 mV
Lead Channel Setting Pacing Amplitude: 2.5 V
Lead Channel Setting Pacing Pulse Width: 0.5 ms
Lead Channel Setting Sensing Sensitivity: 0.5 mV
Pulse Gen Serial Number: 9851932

## 2022-01-05 NOTE — Telephone Encounter (Signed)
?*  STAT* If patient is at the pharmacy, call can be transferred to refill team. ? ? ?1. Which medications need to be refilled? (please list name of each medication and dose if known) carvedilol (COREG) 12.5 MG tablet ? ?2. Which pharmacy/location (including street and city if local pharmacy) is medication to be sent to? [facility] [place], [place] ? ?3. Do they need a 30 day or 90 day supply? 90 ? ?

## 2022-01-06 MED ORDER — CARVEDILOL 12.5 MG PO TABS: 12.5000 mg | ORAL_TABLET | Freq: Two times a day (BID) | ORAL | 3 refills | Status: DC

## 2022-01-12 NOTE — Progress Notes (Signed)
Remote ICD transmission.   

## 2022-01-24 ENCOUNTER — Ambulatory Visit (INDEPENDENT_AMBULATORY_CARE_PROVIDER_SITE_OTHER): Payer: Medicare Other

## 2022-01-24 DIAGNOSIS — Z95810 Presence of automatic (implantable) cardiac defibrillator: Secondary | ICD-10-CM | POA: Diagnosis not present

## 2022-01-24 DIAGNOSIS — I428 Other cardiomyopathies: Secondary | ICD-10-CM

## 2022-01-28 NOTE — Progress Notes (Signed)
EPIC Encounter for ICM Monitoring ? ?Patient Name: Jeffery Thomas is a 71 y.o. male ?Date: 01/28/2022 ?Primary Care Physican: Maximiano Coss, NP ?Primary Cardiologist: Gwenlyn Found ?Electrophysiologist: Allred ?11/19/2021 Weight: 215-219 lbs                                             ?  ?Transmission reviewed.  ?  ?Corvue Thoracic impedance suggesting fluid levels returned to normal. ?   ?Prescribed: Spironolactone 25 mg take 1 tablet daily    ?   ?Labs: ?10/14/2021 Creatinine 1.07, BUN 19, Potassium 4.7, Sodium 137, GFR 71.36 ?09/19/2019 Creatinine 0.91, BUN 20, Potassium 4.3, Sodium 131, GFR >60 ?09/12/2019 Creatinine 0.92, BUN 21, Potassium 4.8, Sodium 139, GFR >60 ?05/02/2019 Creatinine 1.02, BUN 16, Potassium 4.6, Sodium 136, GFR >60 ?A complete set of results can be found in Results Review. ?  ?Recommendations: No changes. ?  ?Follow-up plan: ICM clinic phone appointment on 02/28/2022.  91 day device clinic remote transmission 04/06/2022.    ?  ?EP/Cardiology Office Visits:  Recall 02/20/2021 with Dr Rayann Heman.  Recall 09/19/2022 with Dr Gwenlyn Found. ?  ?Copy of ICM check sent to Dr. Rayann Heman. ?  ?3 month ICM trend: 01/24/2022. ? ? ? ?12-14 Month ICM trend:  ? ? ? ?Rosalene Billings, RN ?01/28/2022 ?3:39 PM ? ?

## 2022-02-28 ENCOUNTER — Ambulatory Visit (INDEPENDENT_AMBULATORY_CARE_PROVIDER_SITE_OTHER): Payer: Medicare Other

## 2022-02-28 DIAGNOSIS — Z95810 Presence of automatic (implantable) cardiac defibrillator: Secondary | ICD-10-CM | POA: Diagnosis not present

## 2022-02-28 DIAGNOSIS — I428 Other cardiomyopathies: Secondary | ICD-10-CM | POA: Diagnosis not present

## 2022-03-01 ENCOUNTER — Telehealth: Payer: Self-pay

## 2022-03-01 NOTE — Progress Notes (Signed)
EPIC Encounter for ICM Monitoring ? ?Patient Name: Jeffery Thomas is a 71 y.o. male ?Date: 03/01/2022 ?Primary Care Physican: Maximiano Coss, NP ?Primary Cardiologist: Gwenlyn Found ?Electrophysiologist: Allred ?11/19/2021 Weight: 215-219 lbs                                             ?  ?Attempted call to patient and unable to reach.  Left detailed message per DPR regarding transmission. Transmission reviewed.  ?  ?Corvue Thoracic impedance suggesting possible fluid accumulation starting 4/24 and also 4/7-4/17 but starting to trend back toward baseline. ?   ?Prescribed: Spironolactone 25 mg take 1 tablet daily    ?   ?Labs: ?10/14/2021 Creatinine 1.07, BUN 19, Potassium 4.7, Sodium 137, GFR 71.36 ?09/19/2019 Creatinine 0.91, BUN 20, Potassium 4.3, Sodium 131, GFR >60 ?09/12/2019 Creatinine 0.92, BUN 21, Potassium 4.8, Sodium 139, GFR >60 ?05/02/2019 Creatinine 1.02, BUN 16, Potassium 4.6, Sodium 136, GFR >60 ?A complete set of results can be found in Results Review. ?  ?Recommendations: Left voice mail with ICM number and encouraged to call if experiencing any fluid symptoms. ?  ?Follow-up plan: ICM clinic phone appointment on 03/07/2022 to recheck fluid levels.  91 day device clinic remote transmission 04/06/2022.    ?  ?EP/Cardiology Office Visits:  Recall 02/20/2021 with Dr Rayann Heman.  Recall 09/19/2022 with Dr Gwenlyn Found. ?  ?Copy of ICM check sent to Dr. Rayann Heman.  Will send to Dr Gwenlyn Found for review if patient is reached. ? ?3 month ICM trend: 02/28/2022. ? ? ? ?12-14 Month ICM trend:  ? ? ? ?Rosalene Billings, RN ?03/01/2022 ?3:46 PM ? ?

## 2022-03-01 NOTE — Telephone Encounter (Signed)
Remote ICM transmission received.  Attempted call to patient regarding ICM remote transmission and left detailed message per DPR.  Advised to return call for any fluid symptoms or questions. Next ICM remote transmission scheduled 03/07/2022.   ? ?

## 2022-03-07 ENCOUNTER — Ambulatory Visit (INDEPENDENT_AMBULATORY_CARE_PROVIDER_SITE_OTHER): Payer: Medicare Other | Admitting: Registered Nurse

## 2022-03-07 ENCOUNTER — Other Ambulatory Visit: Payer: Self-pay

## 2022-03-07 ENCOUNTER — Encounter: Payer: Self-pay | Admitting: Registered Nurse

## 2022-03-07 ENCOUNTER — Ambulatory Visit (INDEPENDENT_AMBULATORY_CARE_PROVIDER_SITE_OTHER): Payer: Medicare Other

## 2022-03-07 VITALS — BP 140/82 | HR 61 | Temp 98.1°F | Resp 17 | Ht 72.0 in | Wt 209.1 lb

## 2022-03-07 DIAGNOSIS — Z8674 Personal history of sudden cardiac arrest: Secondary | ICD-10-CM

## 2022-03-07 DIAGNOSIS — R7303 Prediabetes: Secondary | ICD-10-CM | POA: Diagnosis not present

## 2022-03-07 DIAGNOSIS — I10 Essential (primary) hypertension: Secondary | ICD-10-CM | POA: Diagnosis not present

## 2022-03-07 DIAGNOSIS — Z95810 Presence of automatic (implantable) cardiac defibrillator: Secondary | ICD-10-CM

## 2022-03-07 DIAGNOSIS — I428 Other cardiomyopathies: Secondary | ICD-10-CM

## 2022-03-07 LAB — COMPREHENSIVE METABOLIC PANEL
ALT: 19 U/L (ref 0–53)
AST: 15 U/L (ref 0–37)
Albumin: 4.1 g/dL (ref 3.5–5.2)
Alkaline Phosphatase: 51 U/L (ref 39–117)
BUN: 17 mg/dL (ref 6–23)
CO2: 29 mEq/L (ref 19–32)
Calcium: 8.9 mg/dL (ref 8.4–10.5)
Chloride: 101 mEq/L (ref 96–112)
Creatinine, Ser: 1.14 mg/dL (ref 0.40–1.50)
GFR: 65.95 mL/min (ref >60.00)
Glucose, Bld: 91 mg/dL (ref 70–99)
Potassium: 4.3 mEq/L (ref 3.5–5.1)
Sodium: 137 mEq/L (ref 135–145)
Total Bilirubin: 0.6 mg/dL (ref 0.2–1.2)
Total Protein: 6.6 g/dL (ref 6.0–8.3)

## 2022-03-07 LAB — LIPID PANEL
Cholesterol: 145 mg/dL (ref 0–200)
HDL: 35.7 mg/dL — ABNORMAL LOW (ref >39.00)
LDL Cholesterol: 87 mg/dL (ref 0–99)
NonHDL: 109.76
Total CHOL/HDL Ratio: 4
Triglycerides: 114 mg/dL (ref 0.0–149.0)
VLDL: 22.8 mg/dL (ref 0.0–40.0)

## 2022-03-07 LAB — HEMOGLOBIN A1C: Hgb A1c MFr Bld: 5.9 % (ref 4.6–6.5)

## 2022-03-07 NOTE — Patient Instructions (Signed)
Mr. Schaab -  ? ?Great to see you ? ?Let's see how labs look ? ?I'll call if there are any concerns ? ?See you in 6 mo, sooner if you need anything ? ?Thanks, ? ?Rich  ?

## 2022-03-07 NOTE — Progress Notes (Signed)
? ?Established Patient Office Visit ? ?Subjective:  ?Patient ID: Jeffery Thomas, male    DOB: 16-Oct-1951  Age: 71 y.o. MRN: [id number] ? ?CC:  ?Chief Complaint  ?Patient presents with  ? Follow-up  ?  Patient is here for a 3 mont folllow up for becoming Prediabetic   ? ? ?HPI ?Jeffery Thomas presents for follow up - ? ?Prediabetes ?Last A1c:  ?Lab Results  ?Component Value Date  ? HGBA1C 6.4 10/14/2021  ?  ?Currently taking: metformin '500mg'$  XR po qd ?No new complications ?Reports good compliance with medications ?Diet has been improved since last a1c ?Exercise habits have been steady ? ? ? ?Outpatient Medications Prior to Visit  ?Medication Sig Dispense Refill  ? atorvastatin (LIPITOR) 20 MG tablet TAKE 1 TABLET DAILY 90 tablet 3  ? carvedilol (COREG) 12.5 MG tablet Take 1 tablet (12.5 mg total) by mouth 2 (two) times daily with a meal. 180 tablet 3  ? losartan (COZAAR) 100 MG tablet Take 1 tablet (100 mg total) by mouth daily. 90 tablet 3  ? meloxicam (MOBIC) 15 MG tablet TAKE 1 TABLET DAILY 30 tablet 2  ? metFORMIN (GLUCOPHAGE XR) 500 MG 24 hr tablet Take 1 tablet (500 mg total) by mouth at bedtime. 90 tablet 1  ? omeprazole (PRILOSEC) 40 MG capsule Take 1 capsule (40 mg total) by mouth daily. 90 capsule 3  ? spironolactone (ALDACTONE) 25 MG tablet TAKE 1 TABLET DAILY 90 tablet 3  ? ?No facility-administered medications prior to visit.  ? ? ?Review of Systems  ?Constitutional: Negative.   ?HENT: Negative.    ?Eyes: Negative.   ?Respiratory: Negative.    ?Cardiovascular: Negative.   ?Gastrointestinal: Negative.   ?Genitourinary: Negative.   ?Musculoskeletal: Negative.   ?Skin: Negative.   ?Neurological: Negative.   ?Psychiatric/Behavioral: Negative.    ?All other systems reviewed and are negative. ? ?  ?Objective:  ?  ? ?BP 140/82   Pulse 61   Temp 98.1 ?F (36.7 ?C) (Temporal)   Resp 17   Ht 6' (1.829 m)   Wt 209 lb 1.6 oz (94.8 kg)   SpO2 97%   BMI 28.36 kg/m?  ? ?Wt Readings from Last 3 Encounters:   ?03/07/22 209 lb 1.6 oz (94.8 kg)  ?12/08/21 216 lb (98 kg)  ?10/14/21 221 lb 12.8 oz (100.6 kg)  ? ?Physical Exam ?Constitutional:   ?   General: He is not in acute distress. ?   Appearance: Normal appearance. He is normal weight. He is not ill-appearing, toxic-appearing or diaphoretic.  ?Cardiovascular:  ?   Rate and Rhythm: Normal rate and regular rhythm.  ?   Heart sounds: Normal heart sounds. No murmur heard. ?  No friction rub. No gallop.  ?Pulmonary:  ?   Effort: Pulmonary effort is normal. No respiratory distress.  ?   Breath sounds: Normal breath sounds. No stridor. No wheezing, rhonchi or rales.  ?Chest:  ?   Chest wall: No tenderness.  ?Neurological:  ?   General: No focal deficit present.  ?   Mental Status: He is alert and oriented to person, place, and time. Mental status is at baseline.  ?Psychiatric:     ?   Mood and Affect: Mood normal.     ?   Behavior: Behavior normal.     ?   Thought Content: Thought content normal.     ?   Judgment: Judgment normal.  ? ? ?No results found for any visits on 03/07/22. ? ? ? ?  The ASCVD Risk score (Arnett DK, et al., 2019) failed to calculate for the following reasons: ?  The patient has a prior MI or stroke diagnosis ? ?  ?Assessment & Plan:  ? ?Problem List Items Addressed This Visit   ? ?  ? Cardiovascular and Mediastinum  ? Benign essential HTN  ? Relevant Orders  ? Hemoglobin A1c  ? Comprehensive metabolic panel  ? Lipid panel  ?  ? Other  ? Prediabetes - Primary  ? Relevant Orders  ? Hemoglobin A1c  ? Comprehensive metabolic panel  ? Lipid panel  ? History of cardiac arrest  ? Relevant Orders  ? Hemoglobin A1c  ? Comprehensive metabolic panel  ? Lipid panel  ? ? ?No orders of the defined types were placed in this encounter. ? ? ?Return in about 6 months (around 09/07/2022) for Chronic Conditions.  ? ?PLAN ?Recheck a1c, adjust therapy as indicated ?Follow up in 6 mo ?Patient encouraged to call clinic with any questions, comments, or concerns. ? ? ?Maximiano Coss, NP ?

## 2022-03-08 NOTE — Progress Notes (Signed)
EPIC Encounter for ICM Monitoring ? ?Patient Name: Jeffery Thomas is a 71 y.o. male ?Date: 03/08/2022 ?Primary Care Physican: Maximiano Coss, NP ?Primary Cardiologist: Gwenlyn Found ?Electrophysiologist: Allred ?03/08/2022 Weight: 205 lbs                                             ?  ?Spoke with patient and heart failure questions reviewed.  Pt asymptomatic for fluid accumulation.  He is working out several times a week and eating more healthy.  He has increased fluid intake since he started working out which may be contributing to fluid accumulation.  He has not changed diet.    ?  ?Corvue Thoracic impedance suggesting possible fluid accumulation continuing since 4/24.  Impedance also suggesting possible fluid accumulation 4/7-4/17. ?   ?Prescribed: Spironolactone 25 mg take 1 tablet daily    ?   ?Labs: ?03/07/2022 Creatinine 1.14, BUN 17, Potassium 4.3, Sodium 137, GFR 65.95 ?A complete set of results can be found in Results Review. ?  ?Recommendations:  Advised to limit fluid intake to 64 oz daily on days he is not working out.  Advise to increase fluid intake, when working out, according to how much he is sweating and losing fluid during workout.   ?  ?Follow-up plan: ICM clinic phone appointment on 03/14/2022 to recheck fluid levels.  91 day device clinic remote transmission 04/06/2022.    ?  ?EP/Cardiology Office Visits:  Message sent to EP scheduler 5/2 to call patient for appointment due with Dr Rayann Heman or APP. Recall 02/20/2021 with Dr Rayann Heman.  Recall 09/19/2022 with Dr Gwenlyn Found. ?  ?Copy of ICM check sent to Dr. Rayann Heman and Dr Gwenlyn Found for [facility].  ? ?3 month ICM trend: 03/07/2022. ? ? ? ?12-14 Month ICM trend:  ? ? ? ?Rosalene Billings, RN ?03/08/2022 ?1:57 PM ? ?

## 2022-03-14 ENCOUNTER — Ambulatory Visit (INDEPENDENT_AMBULATORY_CARE_PROVIDER_SITE_OTHER): Payer: Medicare Other

## 2022-03-14 DIAGNOSIS — Z95810 Presence of automatic (implantable) cardiac defibrillator: Secondary | ICD-10-CM

## 2022-03-14 DIAGNOSIS — I428 Other cardiomyopathies: Secondary | ICD-10-CM

## 2022-03-14 NOTE — Progress Notes (Signed)
EPIC Encounter for ICM Monitoring ? ?Patient Name: Jeffery Thomas is a 71 y.o. male ?Date: 03/14/2022 ?Primary Care Physican: Maximiano Coss, NP ?Primary Cardiologist: Gwenlyn Found ?Electrophysiologist: Allred ?03/08/2022 Weight: 205 lbs                                             ?  ?Attempted call to patient and unable to reach.  Left detailed message per DPR regarding transmission. Transmission reviewed.  ?  ?Corvue Thoracic impedance suggesting fluid levels returned to normal 4/28 but possible fluid accumulation restarting 5/4. ?   ?Prescribed: Spironolactone 25 mg take 1 tablet daily    ?   ?Labs: ?03/07/2022 Creatinine 1.14, BUN 17, Potassium 4.3, Sodium 137, GFR 65.95 ?A complete set of results can be found in Results Review. ?  ?Recommendations:  Left voice mail with ICM number and encouraged to call if experiencing any fluid symptoms. ?  ?Follow-up plan: ICM clinic phone appointment on 03/21/2022 to recheck fluid levels.  91 day device clinic remote transmission 04/06/2022.    ?  ?EP/Cardiology Office Visits:  Message sent to EP scheduler 5/2 to call patient for appointment due with Dr Rayann Heman or APP. Recall 02/20/2021 with Dr Rayann Heman.  Recall 09/19/2022 with Dr Gwenlyn Found. ?  ?Copy of ICM check sent to Dr. Rayann Heman.  Will send to Dr Gwenlyn Found for review if patient is reached.  ? ?3 month ICM trend: 03/14/2022. ? ? ? ?12-14 Month ICM trend:  ? ? ? ?Rosalene Billings, RN ?03/14/2022 ?8:53 AM ? ?

## 2022-03-15 ENCOUNTER — Telehealth: Payer: Self-pay

## 2022-03-15 NOTE — Telephone Encounter (Signed)
Remote ICM transmission received.  Attempted call to patient regarding ICM remote transmission and left detailed message per DPR.  Advised to return call for any fluid symptoms or questions. Next ICM remote transmission scheduled 03/21/2022.   ? ?

## 2022-03-21 ENCOUNTER — Ambulatory Visit (INDEPENDENT_AMBULATORY_CARE_PROVIDER_SITE_OTHER): Payer: Medicare Other

## 2022-03-21 DIAGNOSIS — I428 Other cardiomyopathies: Secondary | ICD-10-CM

## 2022-03-21 DIAGNOSIS — Z95810 Presence of automatic (implantable) cardiac defibrillator: Secondary | ICD-10-CM

## 2022-03-23 NOTE — Progress Notes (Signed)
EPIC Encounter for ICM Monitoring ? ?Patient Name: Jeffery Thomas is a 71 y.o. male ?Date: 03/23/2022 ?Primary Care Physican: Maximiano Coss, NP ?Primary Cardiologist: Gwenlyn Found ?Electrophysiologist: Allred ?03/23/2022 Weight: 202-205 lbs                                             ?  ?Spoke with patient and heart failure questions reviewed.  Pt reports stomach bloating during decreased impedance but feeling well at this time and voices no complaints.   Pt reports drinking lot water during April workouts. ?  ?Corvue Thoracic impedance suggesting fluid levels returned normal. ?   ?Prescribed: Spironolactone 25 mg take 1 tablet daily    ?   ?Labs: ?03/07/2022 Creatinine 1.14, BUN 17, Potassium 4.3, Sodium 137, GFR 65.95 ?A complete set of results can be found in Results Review. ?  ?Recommendations:  No changes and encouraged to call if experiencing any fluid symptoms. ?  ?Follow-up plan: ICM clinic phone appointment on 04/07/2022.  91 day device clinic remote transmission 04/06/2022.    ?  ?EP/Cardiology Office Visits:  EP scheduler attempted 3 patient calls in May to schedule appointment with Dr Rayann Heman or APP. Recall 02/20/2021 with Dr Rayann Heman.  Recall 09/19/2022 with Dr Gwenlyn Found. ?  ?Copy of ICM check sent to Dr. Rayann Heman. ? ?3 month ICM trend: 03/21/2022. ? ? ? ?12-14 Month ICM trend:  ? ? ? ?Rosalene Billings, RN ?03/23/2022 ?2:42 PM ? ?

## 2022-04-06 ENCOUNTER — Ambulatory Visit (INDEPENDENT_AMBULATORY_CARE_PROVIDER_SITE_OTHER): Payer: Medicare Other

## 2022-04-06 DIAGNOSIS — I428 Other cardiomyopathies: Secondary | ICD-10-CM | POA: Diagnosis not present

## 2022-04-07 LAB — CUP PACEART REMOTE DEVICE CHECK
Battery Remaining Longevity: 73 mo
Battery Remaining Percentage: 70 %
Battery Voltage: 2.96 V
Brady Statistic RV Percent Paced: 1 %
Date Time Interrogation Session: 20230531020026
HighPow Impedance: 74 Ohm
HighPow Impedance: 74 Ohm
Implantable Lead Implant Date: 20180828
Implantable Lead Location: 753860
Implantable Pulse Generator Implant Date: 20190915
Lead Channel Impedance Value: 380 Ohm
Lead Channel Pacing Threshold Amplitude: 0.5 V
Lead Channel Pacing Threshold Pulse Width: 0.5 ms
Lead Channel Sensing Intrinsic Amplitude: 11.9 mV
Lead Channel Setting Pacing Amplitude: 2.5 V
Lead Channel Setting Pacing Pulse Width: 0.5 ms
Lead Channel Setting Sensing Sensitivity: 0.5 mV
Pulse Gen Serial Number: 9851932

## 2022-04-08 ENCOUNTER — Telehealth: Payer: Self-pay

## 2022-04-08 ENCOUNTER — Ambulatory Visit (INDEPENDENT_AMBULATORY_CARE_PROVIDER_SITE_OTHER): Payer: Medicare Other

## 2022-04-08 DIAGNOSIS — I428 Other cardiomyopathies: Secondary | ICD-10-CM

## 2022-04-08 DIAGNOSIS — Z95810 Presence of automatic (implantable) cardiac defibrillator: Secondary | ICD-10-CM | POA: Diagnosis not present

## 2022-04-08 NOTE — Telephone Encounter (Signed)
Remote ICM transmission received.  Attempted call to patient regarding ICM remote transmission and left detailed message per DPR.  Advised to return call for any fluid symptoms or questions. Next ICM remote transmission scheduled 05/09/2022.

## 2022-04-08 NOTE — Progress Notes (Signed)
EPIC Encounter for ICM Monitoring  Patient Name: Jeffery Thomas is a 71 y.o. male Date: 04/08/2022 Primary Care Physican: Maximiano Coss, NP Primary Cardiologist: Gwenlyn Found Electrophysiologist: Allred 03/23/2022 Weight: 202-205 lbs                                               Attempted call to patient and unable to reach.  Left detailed message per DPR regarding transmission. Transmission reviewed.    Corvue Thoracic impedance suggesting normal fluid levels.    Prescribed: Spironolactone 25 mg take 1 tablet daily       Labs: 03/07/2022 Creatinine 1.14, BUN 17, Potassium 4.3, Sodium 137, GFR 65.95 A complete set of results can be found in Results Review.   Recommendations: Left voice mail with ICM number and encouraged to call if experiencing any fluid symptoms.   Follow-up plan: ICM clinic phone appointment on 05/09/2022.  91 day device clinic remote transmission 07/06/2022.      EP/Cardiology Office Visits:  Pt is aware needs to schedule appointment with Dr Rayann Heman or APP. Recall 02/20/2021 with Dr Rayann Heman.  Recall 09/19/2022 with Dr Gwenlyn Found.   Copy of ICM check sent to Dr. Rayann Heman.  3 month ICM trend: 04/07/2022.    12-14 Month ICM trend:     Rosalene Billings, RN 04/08/2022 5:03 PM

## 2022-04-20 NOTE — Progress Notes (Signed)
Remote ICD transmission.   

## 2022-05-09 ENCOUNTER — Ambulatory Visit (INDEPENDENT_AMBULATORY_CARE_PROVIDER_SITE_OTHER): Payer: Medicare Other

## 2022-05-09 DIAGNOSIS — Z95810 Presence of automatic (implantable) cardiac defibrillator: Secondary | ICD-10-CM

## 2022-05-09 DIAGNOSIS — I428 Other cardiomyopathies: Secondary | ICD-10-CM

## 2022-05-11 ENCOUNTER — Telehealth: Payer: Self-pay

## 2022-05-11 NOTE — Progress Notes (Signed)
EPIC Encounter for ICM Monitoring  Patient Name: TEJAS SEAWOOD is a 71 y.o. male Date: 05/11/2022 Primary Care Physican: Maximiano Coss, NP Primary Cardiologist: Gwenlyn Found Electrophysiologist: Allred 03/23/2022 Weight: 202-205 lbs                                               Attempted call to patient and unable to reach.  Left detailed message per DPR regarding transmission. Transmission reviewed.    Corvue Thoracic impedance normal but was suggesting possible fluid accumulation from 6/8-6/18.    Prescribed: Spironolactone 25 mg take 1 tablet daily       Labs: 03/07/2022 Creatinine 1.14, BUN 17, Potassium 4.3, Sodium 137, GFR 65.95 A complete set of results can be found in Results Review.   Recommendations: Left voice mail with ICM number and encouraged to call if experiencing any fluid symptoms.   Follow-up plan: ICM clinic phone appointment on 06/13/2022.  91 day device clinic remote transmission 07/06/2022.      EP/Cardiology Office Visits:  Pt is aware he needs to schedule appointment with Dr Rayann Heman or APP. Recall 02/20/2021 with Dr Rayann Heman.  Recall 09/19/2022 with Dr Gwenlyn Found.   Copy of ICM check sent to Dr. Rayann Heman.  3 month ICM trend: 05/09/2022.    12-14 Month ICM trend:     Rosalene Billings, RN 05/11/2022 10:16 AM

## 2022-05-11 NOTE — Telephone Encounter (Signed)
Remote ICM transmission received.  Attempted call to patient regarding ICM remote transmission and left detailed message per DPR.  Advised to return call for any fluid symptoms or questions. Next ICM remote transmission scheduled 06/13/2022.

## 2022-06-06 ENCOUNTER — Other Ambulatory Visit: Payer: Self-pay | Admitting: Family Medicine

## 2022-06-06 DIAGNOSIS — R7303 Prediabetes: Secondary | ICD-10-CM

## 2022-06-13 ENCOUNTER — Ambulatory Visit (INDEPENDENT_AMBULATORY_CARE_PROVIDER_SITE_OTHER): Payer: Medicare Other

## 2022-06-13 DIAGNOSIS — I428 Other cardiomyopathies: Secondary | ICD-10-CM

## 2022-06-13 DIAGNOSIS — Z95810 Presence of automatic (implantable) cardiac defibrillator: Secondary | ICD-10-CM | POA: Diagnosis not present

## 2022-06-15 ENCOUNTER — Telehealth: Payer: Self-pay

## 2022-06-15 NOTE — Progress Notes (Signed)
EPIC Encounter for ICM Monitoring  Patient Name: Jeffery Thomas is a 71 y.o. male Date: 06/15/2022 Primary Care Physican: Maximiano Coss, NP Primary Cardiologist: Gwenlyn Found Electrophysiologist: Allred 03/23/2022 Weight: 202-205 lbs                                               Attempted call to patient and unable to reach.  Left detailed message per DPR regarding transmission. Transmission reviewed.    Corvue Thoracic impedance suggesting dryness starting 8/4.  Impedance was suggesting possible fluid accumulation from 7/6-7/17 and 7/25-8/2.    Prescribed: Spironolactone 25 mg take 1 tablet daily       Labs: 03/07/2022 Creatinine 1.14, BUN 17, Potassium 4.3, Sodium 137, GFR 65.95 A complete set of results can be found in Results Review.   Recommendations: Left voice mail with ICM number and encouraged to call if experiencing any fluid symptoms.   Follow-up plan: ICM clinic phone appointment on 06/20/2022 to recheck fluid levels.  91 day device clinic remote transmission 07/06/2022.      EP/Cardiology Office Visits:  Pt is aware he needs to schedule appointment with Dr Rayann Heman or APP. Recall 02/20/2021 with Dr Rayann Heman.  Recall 09/19/2022 with Dr Gwenlyn Found.   Copy of ICM check sent to Dr. Rayann Heman.  3 month ICM trend: 06/13/2022.    12-14 Month ICM trend:     Rosalene Billings, RN 06/15/2022 4:40 PM

## 2022-06-15 NOTE — Telephone Encounter (Signed)
Remote ICM transmission received.  Attempted call to patient regarding ICM remote transmission and left detailed message per DPR.  Advised to return call for any fluid symptoms or questions. Next ICM remote transmission scheduled 06/20/2022.    

## 2022-06-23 NOTE — Progress Notes (Signed)
No ICM remote transmission received for 06/20/2022 and next ICM transmission scheduled for 07/18/2022.

## 2022-07-01 ENCOUNTER — Other Ambulatory Visit: Payer: Self-pay | Admitting: Family Medicine

## 2022-07-12 ENCOUNTER — Ambulatory Visit (INDEPENDENT_AMBULATORY_CARE_PROVIDER_SITE_OTHER): Payer: Medicare Other

## 2022-07-12 DIAGNOSIS — I428 Other cardiomyopathies: Secondary | ICD-10-CM | POA: Diagnosis not present

## 2022-07-15 LAB — CUP PACEART REMOTE DEVICE CHECK
Battery Remaining Longevity: 71 mo
Battery Remaining Percentage: 68 %
Battery Voltage: 2.96 V
Brady Statistic RV Percent Paced: 1 %
Date Time Interrogation Session: 20230905230355
HighPow Impedance: 81 Ohm
HighPow Impedance: 81 Ohm
Implantable Lead Implant Date: 20180828
Implantable Lead Location: 753860
Implantable Pulse Generator Implant Date: 20190915
Lead Channel Impedance Value: 380 Ohm
Lead Channel Pacing Threshold Amplitude: 0.5 V
Lead Channel Pacing Threshold Pulse Width: 0.5 ms
Lead Channel Sensing Intrinsic Amplitude: 11.7 mV
Lead Channel Setting Pacing Amplitude: 2.5 V
Lead Channel Setting Pacing Pulse Width: 0.5 ms
Lead Channel Setting Sensing Sensitivity: 0.5 mV
Pulse Gen Serial Number: 9851932

## 2022-07-18 ENCOUNTER — Ambulatory Visit (INDEPENDENT_AMBULATORY_CARE_PROVIDER_SITE_OTHER): Payer: Medicare Other

## 2022-07-18 DIAGNOSIS — I428 Other cardiomyopathies: Secondary | ICD-10-CM

## 2022-07-18 DIAGNOSIS — Z95810 Presence of automatic (implantable) cardiac defibrillator: Secondary | ICD-10-CM | POA: Diagnosis not present

## 2022-07-20 NOTE — Progress Notes (Signed)
EPIC Encounter for ICM Monitoring  Patient Name: Jeffery Thomas is a 71 y.o. male Date: 07/20/2022 Primary Care Physican: Maximiano Coss, NP Primary Cardiologist: Gwenlyn Found Electrophysiologist: Quentin Ore 03/23/2022 Weight: 202-205 lbs        07/20/2022 Weight: 200 lbs                                         Spoke with patient and heart failure questions reviewed.  Pt asymptomatic for fluid accumulation.  Reports feeling well at this time and voices no complaints.    Corvue Thoracic impedance normal but was suggesting intermittent days with possible fluid accumulation.  Impedance pattern for last month appears more stable.     Prescribed: Spironolactone 25 mg take 1 tablet daily       Labs: 03/07/2022 Creatinine 1.14, BUN 17, Potassium 4.3, Sodium 137, GFR 65.95 A complete set of results can be found in Results Review.   Recommendations: No changes and encouraged to call if experiencing any fluid symptoms.   Follow-up plan: ICM clinic phone appointment on 08/22/2022  91 day device clinic remote transmission 07/06/2022.      EP/Cardiology Office Visits:  Advised patient to call the office to schedule appointment with Dr Quentin Ore who is replacing Dr Rayann Heman.  Recall 02/20/2021 with Dr Rayann Heman.  Recall 09/19/2022 with Dr Gwenlyn Found.   Copy of ICM check sent to Dr. Quentin Ore.   3 month ICM trend: 07/18/2022.    12-14 Month ICM trend:     Rosalene Billings, RN 07/20/2022 10:28 AM

## 2022-08-04 NOTE — Progress Notes (Signed)
Remote ICD transmission.   

## 2022-08-22 ENCOUNTER — Ambulatory Visit (INDEPENDENT_AMBULATORY_CARE_PROVIDER_SITE_OTHER): Payer: Medicare Other

## 2022-08-22 DIAGNOSIS — Z95810 Presence of automatic (implantable) cardiac defibrillator: Secondary | ICD-10-CM

## 2022-08-22 DIAGNOSIS — I428 Other cardiomyopathies: Secondary | ICD-10-CM

## 2022-08-23 ENCOUNTER — Telehealth: Payer: Self-pay

## 2022-08-23 NOTE — Progress Notes (Signed)
EPIC Encounter for ICM Monitoring  Patient Name: Jeffery Thomas is a 71 y.o. male Date: 08/23/2022 Primary Care Physican: Maximiano Coss, NP Primary Cardiologist: Gwenlyn Found Electrophysiologist: Quentin Ore 03/23/2022 Weight: 202-205 lbs        07/20/2022 Weight: 200 lbs                                         Attempted call to patient and unable to reach.  Left detailed message per DPR regarding transmission. Transmission reviewed.    Corvue Thoracic impedance suggesting possible fluid accumulation 10/2-10/9 followed by possible dryness starting 10/10.      Prescribed: Spironolactone 25 mg take 1 tablet daily       Labs: 03/07/2022 Creatinine 1.14, BUN 17, Potassium 4.3, Sodium 137, GFR 65.95 A complete set of results can be found in Results Review.   Recommendations: Left voice mail with ICM number and encouraged to call if experiencing any fluid symptoms.   Follow-up plan: ICM clinic phone appointment on 08/29/2022 to recheck fluid levels.  91 day device clinic remote transmission 10/11/2022.      EP/Cardiology Office Visits:  Pt aware to contact office to schedule appointment with Dr Quentin Ore.  Recall 02/20/2021 with Dr Rayann Heman.  Recall 09/19/2022 with Dr Gwenlyn Found.   Copy of ICM check sent to Dr. Quentin Ore.   3 month ICM trend: 08/22/2022.    12-14 Month ICM trend:     Rosalene Billings, RN 08/23/2022 2:23 PM

## 2022-08-23 NOTE — Telephone Encounter (Signed)
Remote ICM transmission received.  Attempted call to patient regarding ICM remote transmission and left detailed message per DPR.  Advised to return call for any fluid symptoms or questions. Next ICM remote transmission scheduled 08/29/2022.    

## 2022-08-29 ENCOUNTER — Ambulatory Visit (INDEPENDENT_AMBULATORY_CARE_PROVIDER_SITE_OTHER): Payer: Medicare Other

## 2022-08-29 ENCOUNTER — Telehealth: Payer: Self-pay

## 2022-08-29 DIAGNOSIS — Z95810 Presence of automatic (implantable) cardiac defibrillator: Secondary | ICD-10-CM

## 2022-08-29 DIAGNOSIS — I428 Other cardiomyopathies: Secondary | ICD-10-CM

## 2022-08-29 NOTE — Telephone Encounter (Signed)
Remote ICM transmission received.  Attempted call to patient regarding ICM remote transmission and left detailed message per DPR to return call.  Advised to return call for any fluid symptoms or questions.      

## 2022-08-29 NOTE — Progress Notes (Signed)
EPIC Encounter for ICM Monitoring  Patient Name: Jeffery Thomas is a 71 y.o. male Date: 08/29/2022 Primary Care Physican: Maximiano Coss, NP Primary Cardiologist: Gwenlyn Found Electrophysiologist: Quentin Ore 03/23/2022 Weight: 202-205 lbs        07/20/2022 Weight: 200 lbs                                         Attempted call to patient and unable to reach.  Left detailed message per DPR regarding transmission. Transmission reviewed.    Corvue Thoracic impedance suggesting possible fluid accumulation starting 10/22.   Impedance also suggesting fluid accumulation 10/2-10/9 followed by possible dryness from 10/10-10/21.      Prescribed: Spironolactone 25 mg take 1 tablet daily       Labs: 03/07/2022 Creatinine 1.14, BUN 17, Potassium 4.3, Sodium 137, GFR 65.95 A complete set of results can be found in Results Review.   Recommendations: Left voice mail with ICM number and encouraged to call if experiencing any fluid symptoms.   Follow-up plan: ICM clinic phone appointment on 09/05/2022 to recheck fluid levels.  91 day device clinic remote transmission 10/11/2022.      EP/Cardiology Office Visits:  Pt aware to contact office to schedule appointment with Dr Quentin Ore.  Recall 02/20/2021 with Dr Rayann Heman.  Recall 09/19/2022 with Dr Gwenlyn Found.   Copy of ICM check sent to Dr. Quentin Ore.   3 month ICM trend: 08/29/2022.    12-14 Month ICM trend:     Rosalene Billings, RN 08/29/2022 7:45 AM

## 2022-08-30 ENCOUNTER — Other Ambulatory Visit: Payer: Self-pay | Admitting: Cardiovascular Disease

## 2022-09-05 ENCOUNTER — Ambulatory Visit (INDEPENDENT_AMBULATORY_CARE_PROVIDER_SITE_OTHER): Payer: Medicare Other

## 2022-09-05 DIAGNOSIS — Z95810 Presence of automatic (implantable) cardiac defibrillator: Secondary | ICD-10-CM

## 2022-09-05 DIAGNOSIS — I428 Other cardiomyopathies: Secondary | ICD-10-CM

## 2022-09-05 NOTE — Progress Notes (Signed)
EPIC Encounter for ICM Monitoring  Patient Name: Jeffery Thomas is a 71 y.o. male Date: 09/05/2022 Primary Care Physican: Maximiano Coss, NP Primary Cardiologist: Gwenlyn Found Electrophysiologist: Quentin Ore 03/23/2022 Weight: 202-205 lbs        07/20/2022 Weight: 200 lbs                                         Spoke with patient and heart failure questions reviewed.  Transmission results reviewed.  Pt asymptomatic for fluid accumulation.  He is working out twice a day and feeling great.  He reports following recommendations to limit fluid intake to 64 oz and low salt diet.    Corvue Thoracic impedance suggesting possible fluid accumulation starting 10/22.   Impedance also suggesting fluid accumulation 10/2-10/9 followed by possible dryness from 10/10-10/21.      Prescribed: Spironolactone 25 mg take 1 tablet daily       Labs: 03/07/2022 Creatinine 1.14, BUN 17, Potassium 4.3, Sodium 137, GFR 65.95 A complete set of results can be found in Results Review.   Recommendations:   Advised to call if he develops any fluid symptoms.  Provided EP scheduler number and advised to call for overdue appointment.     Follow-up plan: ICM clinic phone appointment on 09/26/2022.  91 day device clinic remote transmission 10/11/2022.      EP/Cardiology Office Visits:  Pt will call the office to schedule appointment with Dr Quentin Ore.  Recall 02/20/2021 with Dr Rayann Heman.  Recall 09/19/2022 with Dr Gwenlyn Found.   Copy of ICM check sent to Dr. Quentin Ore.   3 month ICM trend: 09/05/2022.    12-14 Month ICM trend:     Rosalene Billings, RN 09/05/2022 1:28 PM

## 2022-09-07 ENCOUNTER — Ambulatory Visit: Payer: Medicare Other | Admitting: Registered Nurse

## 2022-09-22 ENCOUNTER — Encounter: Payer: Self-pay | Admitting: Cardiology

## 2022-09-26 ENCOUNTER — Other Ambulatory Visit: Payer: Self-pay | Admitting: Cardiovascular Disease

## 2022-09-26 ENCOUNTER — Ambulatory Visit (INDEPENDENT_AMBULATORY_CARE_PROVIDER_SITE_OTHER): Payer: Medicare Other

## 2022-09-26 DIAGNOSIS — I428 Other cardiomyopathies: Secondary | ICD-10-CM

## 2022-09-26 DIAGNOSIS — Z95810 Presence of automatic (implantable) cardiac defibrillator: Secondary | ICD-10-CM | POA: Diagnosis not present

## 2022-09-28 ENCOUNTER — Telehealth: Payer: Self-pay

## 2022-09-28 NOTE — Telephone Encounter (Signed)
Remote ICM transmission received.  Attempted call to patient regarding ICM remote transmission and left detailed message per DPR.  Advised to return call for any fluid symptoms or questions. Next ICM remote transmission scheduled 1/82024.    

## 2022-09-28 NOTE — Progress Notes (Signed)
EPIC Encounter for ICM Monitoring  Patient Name: Jeffery Thomas is a 71 y.o. male Date: 09/28/2022 Primary Care Physican: Maximiano Coss, NP Primary Cardiologist: Gwenlyn Found Electrophysiologist: Quentin Ore 03/23/2022 Weight: 202-205 lbs        07/20/2022 Weight: 200 lbs                                         Attempted call to patient and unable to reach.  Left detailed message per DPR regarding transmission. Transmission reviewed.    Corvue Thoracic impedance normal but was suggesting possible fluid accumulation from 10/22 -11/6.         Prescribed: Spironolactone 25 mg take 1 tablet daily       Labs: 03/07/2022 Creatinine 1.14, BUN 17, Potassium 4.3, Sodium 137, GFR 65.95 A complete set of results can be found in Results Review.   Recommendations:   Left voice mail with ICM number and encouraged to call if experiencing any fluid symptoms.   Follow-up plan: ICM clinic phone appointment on 11/14/2022.  91 day device clinic remote transmission 10/11/2022.      EP/Cardiology Office Visits:  Pt aware to office to schedule appointment with Dr Quentin Ore.  Recall 02/20/2021 with Dr Rayann Heman.  Recall 09/19/2022 with Dr Gwenlyn Found.   Copy of ICM check sent to Dr. Quentin Ore.    3 month ICM trend: 09/26/2022.    12-14 Month ICM trend:     Rosalene Billings, RN 09/28/2022 1:04 PM

## 2022-10-11 ENCOUNTER — Ambulatory Visit (INDEPENDENT_AMBULATORY_CARE_PROVIDER_SITE_OTHER): Payer: Medicare Other

## 2022-10-11 DIAGNOSIS — I428 Other cardiomyopathies: Secondary | ICD-10-CM

## 2022-10-11 LAB — CUP PACEART REMOTE DEVICE CHECK
Battery Remaining Longevity: 69 mo
Battery Remaining Percentage: 66 %
Battery Voltage: 2.96 V
Brady Statistic RV Percent Paced: 1 %
Date Time Interrogation Session: 20231205020018
HighPow Impedance: 87 Ohm
HighPow Impedance: 87 Ohm
Implantable Lead Connection Status: 753985
Implantable Lead Implant Date: 20180828
Implantable Lead Location: 753860
Implantable Pulse Generator Implant Date: 20190915
Lead Channel Impedance Value: 390 Ohm
Lead Channel Pacing Threshold Amplitude: 0.5 V
Lead Channel Pacing Threshold Pulse Width: 0.5 ms
Lead Channel Sensing Intrinsic Amplitude: 11.9 mV
Lead Channel Setting Pacing Amplitude: 2.5 V
Lead Channel Setting Pacing Pulse Width: 0.5 ms
Lead Channel Setting Sensing Sensitivity: 0.5 mV
Pulse Gen Serial Number: 9851932

## 2022-10-31 ENCOUNTER — Other Ambulatory Visit: Payer: Self-pay | Admitting: Cardiovascular Disease

## 2022-11-08 NOTE — Progress Notes (Signed)
Remote ICD transmission.   

## 2022-11-10 ENCOUNTER — Other Ambulatory Visit: Payer: Self-pay | Admitting: Cardiovascular Disease

## 2022-11-14 ENCOUNTER — Ambulatory Visit (INDEPENDENT_AMBULATORY_CARE_PROVIDER_SITE_OTHER): Payer: Medicare Other

## 2022-11-14 DIAGNOSIS — Z95810 Presence of automatic (implantable) cardiac defibrillator: Secondary | ICD-10-CM | POA: Diagnosis not present

## 2022-11-14 DIAGNOSIS — I428 Other cardiomyopathies: Secondary | ICD-10-CM | POA: Diagnosis not present

## 2022-11-17 NOTE — Progress Notes (Signed)
EPIC Encounter for ICM Monitoring  Patient Name: Jeffery Thomas is a 72 y.o. male Date: 11/17/2022 Primary Care Physican: Maximiano Coss, NP Primary Cardiologist: Gwenlyn Found Electrophysiologist: Quentin Ore 03/23/2022 Weight: 202-205 lbs        07/20/2022 Weight: 200 lbs                                         Transmission reviewed.    Corvue Thoracic impedance suggesting normal fluid levels.         Prescribed: Spironolactone 25 mg take 1 tablet daily       Labs: 03/07/2022 Creatinine 1.14, BUN 17, Potassium 4.3, Sodium 137, GFR 65.95 A complete set of results can be found in Results Review.   Recommendations:   No changes   Follow-up plan: ICM clinic phone appointment on 12/19/2022.  91 day device clinic remote transmission 01/10/2023.      EP/Cardiology Office Visits:  Pt aware to call office for overdue appointment with Dr Quentin Ore.  Recall 02/20/2021 with Dr Quentin Ore.   Recall 09/19/2022 with Dr Gwenlyn Found.   Copy of ICM check sent to Dr. Quentin Ore.     3 month ICM trend: 11/14/2022.    12-14 Month ICM trend:     Rosalene Billings, RN 11/17/2022 8:13 AM

## 2022-12-19 ENCOUNTER — Ambulatory Visit: Payer: Medicare Other

## 2022-12-19 DIAGNOSIS — I428 Other cardiomyopathies: Secondary | ICD-10-CM

## 2022-12-19 DIAGNOSIS — Z95810 Presence of automatic (implantable) cardiac defibrillator: Secondary | ICD-10-CM | POA: Diagnosis not present

## 2022-12-22 ENCOUNTER — Telehealth: Payer: Self-pay

## 2022-12-22 NOTE — Telephone Encounter (Signed)
Remote ICM transmission received.  Attempted call to patient regarding ICM remote transmission and left detailed message per DPR.  Advised to return call for any fluid symptoms or questions. Next ICM remote transmission scheduled 01/23/2023.

## 2022-12-22 NOTE — Progress Notes (Signed)
EPIC Encounter for ICM Monitoring  Patient Name: Jeffery Thomas is a 72 y.o. male Date: 12/22/2022 Primary Care Physican: Maximiano Coss, NP Primary Cardiologist: Gwenlyn Found Electrophysiologist: Quentin Ore 03/23/2022 Weight: 202-205 lbs        07/20/2022 Weight: 200 lbs                                         Attempted call to patient and unable to reach.  Left detailed message per DPR regarding transmission. Transmission reviewed.    Corvue Thoracic impedance suggesting normal fluid levels.         Prescribed: Spironolactone 25 mg take 1 tablet daily       Labs: 03/07/2022 Creatinine 1.14, BUN 17, Potassium 4.3, Sodium 137, GFR 65.95 A complete set of results can be found in Results Review.   Recommendations:   Left voice mail with ICM number and encouraged to call if experiencing any fluid symptoms.   Follow-up plan: ICM clinic phone appointment on 01/23/2023.  91 day device clinic remote transmission 01/10/2023.      EP/Cardiology Office Visits:  Pt aware to call office for overdue appointment with Dr Quentin Ore.  Recall 02/20/2021 with Dr Quentin Ore.   Recall 09/19/2022 with Dr Gwenlyn Found.   Copy of ICM check sent to Dr. Quentin Ore.      3 month ICM trend: 12/19/2022.    12-14 Month ICM trend:     Rosalene Billings, RN 12/22/2022 12:32 PM

## 2023-01-02 NOTE — Progress Notes (Unsigned)
Cardiology Office Note Date:  01/04/2023  Patient ID:  Jeffery Thomas, Jeffery Thomas 06-13-1951, MRN [id number] PCP:  Maximiano Coss, NP  Cardiologist:  Dr. Gwenlyn Found Electrophysiologist: Dr. Rayann Heman     Chief Complaint:  over due  History of Present Illness: Jeffery Thomas is a 72 y.o. male with history of HTN, HLD, GERD, cardiac arrest  06/26/17.    He was in his usual state of health until after dinner this evening.  He reported chest pain to his wife and went back to the bedroom.  She heard him fall, found him down and unresponsive.  EMS was summoned, AED applied and he was shocked.  He was then found to be in PEA and got epinephrine.  He went into vfib again and was shocked, then pulseless VT and was shocked again. Finally attained perfusing rhythm with sinus tachy after about 20 minutes of CPR and treatment.  He underwent emergent cath with no CAD, LVEF 20%, was cooled. 06/28/17 LVEF by echo 35-40%.   ICD implanted Discharged 07/05/2017   Last saw Dr. Rayann Heman 2020, no symptoms, no shocks, EF had recovered, no changes were made.   He has seen cardiology a few times, most recently Dr. Gwenlyn Found 09/24/21, remained active, no particular symptoms, on good tx, no changes were made.  Followed by [facility] clinic RN  TODAY He feels great Exercised daily on his rowing machine, has excellent exertional capacity He has quit drinking all together No CP, palpitations or cardiac awareness No SOB, DOE No near syncope or syncope. No shocks   Device information Abbott single chamber ICD implanted 07/04/2017 Premature battery depletion > gen change 07/22/2018 (Dr. Lovena Le) SECONDARY PREVENTION   Past Medical History:  Diagnosis Date   AICD (automatic cardioverter/defibrillator) present    Chronic systolic CHF (congestive heart failure) (HCC)    Gastroesophageal reflux disease    History of alcohol abuse    quit 08/ 20/ 2018   History of cardiac arrest    History of encephalopathy 07/05/2017    anoxic-ischemic    History of ST elevation myocardial infarction (STEMI) 06/26/2017   sudden cardiac arrest /  per cardiac cath 06-26-2017 normal coronaries, ef <20% , severe LVSD; dx takatsuki syndrome   History of sudden cardiac arrest 06/26/2017   VF arrest / STEMI    Hypertension    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   premature generator change 07-22-2018--  followed by dr allred  ([facility] [place])   Inguinal hernia    left   Left hydrocele    Left inguinal hernia    Left inguinal hernia 05/07/2019   Myocardial infarction [facility])    NICM (nonischemic cardiomyopathy) ([place])    06-26-2017  per cardiac cath , ef <20% (echo 35-40%)  ;  last echo 10-09-2014 ef 60-65%   OSA (obstructive sleep apnea)    03-26-2019 per pt had sleep study after heart attack, was told no recommendation , but did stop smoking and alcohol   Pre-diabetes    denies   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   sudden cardiac arrest w/ VF and STEMI,  s/p  ICD 07-04-2017    Past Surgical History:  Procedure Laterality Date   CATARACT EXTRACTION W/ INTRAOCULAR LENS IMPLANT Left 2017   COLONOSCOPY     HYDROCELE EXCISION Left 04/02/2019   Procedure: HYDROCELECTOMY ADULT;  Surgeon: Festus Aloe, MD;  Location: [facility];  Service: Urology;  Laterality: Left;   ICD GENERATOR CHANGEOUT N/A 07/22/2018   Procedure:  ICD GENERATOR CHANGEOUT;  Surgeon: Evans Lance, MD;  Location: [facility];  Service: Cardiovascular;  Laterality: N/A;   ICD IMPLANT N/A 07/04/2017   SJM Fortify Assura VR ICD implanted by Dr Rayann Heman for secondary prevention after VF arrest   INGUINAL HERNIA REPAIR Left 05/07/2019   Procedure: OPEN REPAIR LEFT INGUINAL HERNIA WITH MESH;  Surgeon: Fanny Skates, MD;  Location: [facility];  Service: General;  Laterality: Left;   LEFT HEART CATH AND CORONARY ANGIOGRAPHY N/A 06/26/2017   Procedure: LEFT HEART CATH AND CORONARY ANGIOGRAPHY;  Surgeon: Lorretta Harp,  MD;  Location: [facility];  Service: Cardiovascular;  Laterality: N/A;   MENISCUS REPAIR Left 1983   left knee   TOTAL HIP ARTHROPLASTY Right 09/18/2019   Procedure: TOTAL HIP ARTHROPLASTY ANTERIOR APPROACH;  Surgeon: Gaynelle Arabian, MD;  Location: [facility];  Service: Orthopedics;  Laterality: Right;   TOTAL KNEE ARTHROPLASTY Left 2009  approx.   TOTAL KNEE REVISION Left 04/18/2018   Procedure: LEFT TOTAL KNEE REVISION;  Surgeon: Gaynelle Arabian, MD;  Location: [facility];  Service: Orthopedics;  Laterality: Left;  Adductor Block    Current Outpatient Medications  Medication Sig Dispense Refill   atorvastatin (LIPITOR) 20 MG tablet TAKE 1 TABLET DAILY 90 tablet 3   carvedilol (COREG) 12.5 MG tablet Take 1 tablet (12.5 mg total) by mouth 2 (two) times daily with a meal. Please keep scheduled appointment 90 tablet 1   losartan (COZAAR) 100 MG tablet TAKE 1 TABLET DAILY 90 tablet 3   meloxicam (MOBIC) 15 MG tablet TAKE 1 TABLET DAILY AS NEEDED. 30 tablet 2   omeprazole (PRILOSEC) 40 MG capsule Take 1 capsule (40 mg total) by mouth daily. 90 capsule 3   spironolactone (ALDACTONE) 25 MG tablet TAKE 1 TABLET DAILY 90 tablet 3   metFORMIN (GLUCOPHAGE XR) 500 MG 24 hr tablet Take 1 tablet (500 mg total) by mouth at bedtime. (Patient not taking: Reported on 01/04/2023) 90 tablet 1   No current facility-administered medications for this visit.    Allergies:   Patient has no known allergies.   Social History:  The patient  reports that he quit smoking about 5 years ago. His smoking use included cigars. He has never used smokeless tobacco. He reports current alcohol use of about 6.0 standard drinks of alcohol per week. He reports that he does not use drugs.   Family History:  The patient's family history includes Cancer in his brother; Diabetes in his father; Hypertension in his mother.  ROS:  Please see the history of present illness.    All other systems are reviewed and otherwise negative.    PHYSICAL EXAM:  VS:  BP 114/84   Pulse 64   Ht 6' (1.829 m)   Wt 210 lb 12.8 oz (95.6 kg)   SpO2 96%   BMI 28.59 kg/m  BMI: Body mass index is 28.59 kg/m. Well nourished, well developed, in no acute distress HEENT: normocephalic, atraumatic Neck: no JVD, carotid bruits or masses Cardiac:   RRR; no significant murmurs, no rubs, or gallops Lungs:   CTA b/l, no wheezing, rhonchi or rales Abd: soft, nontender MS: no deformity or  atrophy Ext:  no edema Skin: warm and dry, no rash Neuro:  No gross deficits appreciated Psych: euthymic mood, full affect  ICD site is stable, no tethering or discomfort   EKG:  Done today and reviewed by myself shows  SR 64bpm  Device interrogation done today and reviewed by  myself:  Battery and lead measurements are good No VT   09/10/2019: TTE 1. Left ventricular ejection fraction, by visual estimation, is 60 to  65%. The left ventricle has normal function. There is no left ventricular  hypertrophy.   2. Left ventricular diastolic parameters are consistent with Grade I  diastolic dysfunction (impaired relaxation).   3. Global right ventricle has normal systolic function.The right  ventricular size is normal. No increase in right ventricular wall  thickness.   4. Left atrial size was normal.   5. Right atrial size was normal.   6. The mitral valve is normal in structure. No evidence of mitral valve  regurgitation. No evidence of mitral stenosis.   7. The tricuspid valve is normal in structure. Tricuspid valve  regurgitation is trivial.   8. The aortic valve is normal in structure. Aortic valve regurgitation is  not visualized. No evidence of aortic valve sclerosis or stenosis.   9. The pulmonic valve was normal in structure. Pulmonic valve  regurgitation is not visualized.  10. Normal pulmonary artery systolic pressure.  11. A pacer wire is visualized.  12. The inferior vena cava is normal in size with greater than 50%  respiratory  variability, suggesting right atrial pressure of 3 mmHg.    06/26/17: [facility] IMPRESSION: Jeffery Thomas has normal coronary arteries and severe LV dysfunction in a configuration consistent with "Takatsubo  Syndrome". His EF is approximately 20%. He was hemodynamically stable with no complex. He has had no arrhythmias. Potassium was 3.1 and he will be repleted. He will be systemically cooled . Pulmonary critical care medicine is aware. The sheath was removed and pressure held on the groin to achieve hemostasis. The patient left the lab in stable condition.   2-D echocardiogram 06/28/2017: Study Conclusions - Left ventricle: The cavity size was normal. Wall thickness was   increased in a pattern of mild LVH. Systolic function was   moderately reduced. The estimated ejection fraction was in the   range of 35% to 40%. Diffuse hypokinesis. Doppler parameters are   consistent with abnormal left ventricular relaxation (grade 1   diastolic dysfunction). - Aortic valve: There was no stenosis. - Mitral valve: There was no significant regurgitation. - Right ventricle: The cavity size was normal. Systolic function   was mildly reduced. - Pulmonary arteries: No complete TR doppler jet so unable to   estimate PA systolic pressure. - Systemic veins: The IVC measured 2.2 cm with < 50% respirophasic   variation, suggesting RA pressure 15 mmHg.  Impressions: - Normal LV size with mild LV hypertrophy. EF 35-40%, diffuse   hypokinesis. Normal RV size with mildly decreased systolic   function. No significant valvular abnormalities.  Recent Labs: 03/07/2022: ALT 19; BUN 17; Creatinine, Ser 1.14; Potassium 4.3; Sodium 137  03/07/2022: Cholesterol 145; HDL 35.70; LDL Cholesterol 87; Total CHOL/HDL Ratio 4; Triglycerides 114.0; VLDL 22.8   CrCl cannot be calculated (Patient's most recent lab result is older than the maximum 21 days allowed.).   Wt Readings from Last 3 Encounters:  01/04/23 210 lb 12.8 oz (95.6 kg)   03/07/22 209 lb 1.6 oz (94.8 kg)  12/08/21 216 lb (98 kg)     Other studies reviewed: Additional studies/records reviewed today include: summarized above  ASSESSMENT AND PLAN:  ICD Intact function No programming changes made  Cardiac arrest No syncope/symptoms  NICM Recovered LVEF by his last echo No symptoms or exam findings of volume OL CorVue looks great VP <1%   Discussed need  for new EP MD< will transition to Dr. Quentin Ore  Disposition: F/u with remotes as usual, in clinic with Dr. Quentin Ore in a year, sooner if needed  Current medicines are reviewed at length with the patient today.  The patient did not have any concerns regarding medicines.  Venetia Night, PA-C 01/04/2023 10:13 AM     [facility] [place] [address] [phone] (office)  [phone] (fax)

## 2023-01-04 ENCOUNTER — Encounter: Payer: Self-pay | Admitting: Physician Assistant

## 2023-01-04 ENCOUNTER — Ambulatory Visit: Payer: Medicare Other | Attending: Physician Assistant | Admitting: Physician Assistant

## 2023-01-04 VITALS — BP 114/84 | HR 64 | Ht 72.0 in | Wt 210.8 lb

## 2023-01-04 DIAGNOSIS — I428 Other cardiomyopathies: Secondary | ICD-10-CM | POA: Insufficient documentation

## 2023-01-04 DIAGNOSIS — I469 Cardiac arrest, cause unspecified: Secondary | ICD-10-CM | POA: Diagnosis present

## 2023-01-04 DIAGNOSIS — Z95810 Presence of automatic (implantable) cardiac defibrillator: Secondary | ICD-10-CM | POA: Insufficient documentation

## 2023-01-04 LAB — CUP PACEART INCLINIC DEVICE CHECK
Battery Remaining Longevity: 69 mo
Brady Statistic RV Percent Paced: 0.03 %
Date Time Interrogation Session: 20240228122406
HighPow Impedance: 88.875
Implantable Lead Connection Status: 753985
Implantable Lead Implant Date: 20180828
Implantable Lead Location: 753860
Implantable Pulse Generator Implant Date: 20190915
Lead Channel Impedance Value: 437.5 Ohm
Lead Channel Pacing Threshold Amplitude: 1 V
Lead Channel Pacing Threshold Amplitude: 1 V
Lead Channel Pacing Threshold Pulse Width: 0.5 ms
Lead Channel Pacing Threshold Pulse Width: 0.5 ms
Lead Channel Sensing Intrinsic Amplitude: 11.9 mV
Lead Channel Setting Pacing Amplitude: 2.5 V
Lead Channel Setting Pacing Pulse Width: 0.5 ms
Lead Channel Setting Sensing Sensitivity: 0.5 mV
Pulse Gen Serial Number: 9851932

## 2023-01-04 NOTE — Patient Instructions (Signed)
Medication Instructions:   Your physician recommends that you continue on your current medications as directed. Please refer to the Current Medication list given to you today.   *If you need a refill on your cardiac medications before your next appointment, please call your pharmacy*   Lab Work: [place]     If you have labs (blood work) drawn today and your tests are completely normal, you will receive your results only by: [place] (if you have MyChart) OR A paper copy in the mail If you have any lab test that is abnormal or we need to change your treatment, we will call you to review the results.   Testing/Procedures: NONE ORDERED  TODAY     Follow-Up: At [facility], you and your health needs are our priority.  As part of our continuing mission to provide you with exceptional heart care, we have created designated Provider Care Teams.  These Care Teams include your primary Cardiologist (physician) and Advanced Practice Providers (APPs -  Physician Assistants and Nurse Practitioners) who all work together to provide you with the care you need, when you need it.  We recommend signing up for the patient portal called "MyChart".  Sign up information is provided on this After Visit Summary.  MyChart is used to connect with patients for Virtual Visits (Telemedicine).  Patients are able to view lab/test results, encounter notes, upcoming appointments, etc.  Non-urgent messages can be sent to your provider as well.   To learn more about what you can do with MyChart, go to [email or web address].    Your next appointment:   NEXT AVAILABLE  WITH DR BERRY  1 year(s)  Provider:   You may see Dr. Quentin Ore   or one of the following Advanced Practice Providers on your designated Care Team:     Other Instructions

## 2023-01-10 ENCOUNTER — Ambulatory Visit (INDEPENDENT_AMBULATORY_CARE_PROVIDER_SITE_OTHER): Payer: Medicare Other

## 2023-01-10 DIAGNOSIS — I428 Other cardiomyopathies: Secondary | ICD-10-CM | POA: Diagnosis not present

## 2023-01-10 LAB — CUP PACEART REMOTE DEVICE CHECK
Battery Remaining Longevity: 67 mo
Battery Remaining Percentage: 64 %
Battery Voltage: 2.95 V
Brady Statistic RV Percent Paced: 1 %
Date Time Interrogation Session: 20240305020016
HighPow Impedance: 83 Ohm
HighPow Impedance: 83 Ohm
Implantable Lead Connection Status: 753985
Implantable Lead Implant Date: 20180828
Implantable Lead Location: 753860
Implantable Pulse Generator Implant Date: 20190915
Lead Channel Impedance Value: 380 Ohm
Lead Channel Pacing Threshold Amplitude: 1 V
Lead Channel Pacing Threshold Pulse Width: 0.5 ms
Lead Channel Sensing Intrinsic Amplitude: 10.4 mV
Lead Channel Setting Pacing Amplitude: 2.5 V
Lead Channel Setting Pacing Pulse Width: 0.5 ms
Lead Channel Setting Sensing Sensitivity: 0.5 mV
Pulse Gen Serial Number: 9851932

## 2023-01-23 ENCOUNTER — Ambulatory Visit: Payer: Medicare Other | Attending: Cardiology

## 2023-01-23 DIAGNOSIS — Z95810 Presence of automatic (implantable) cardiac defibrillator: Secondary | ICD-10-CM

## 2023-01-23 DIAGNOSIS — I428 Other cardiomyopathies: Secondary | ICD-10-CM

## 2023-01-27 NOTE — Progress Notes (Signed)
EPIC Encounter for ICM Monitoring  Patient Name: MUBASHIR GERE is a 72 y.o. male Date: 01/27/2023 Primary Care Physican: Maximiano Coss, NP Primary Cardiologist: Gwenlyn Found Electrophysiologist: Quentin Ore 03/23/2022 Weight: 202-205 lbs        07/20/2022 Weight: 200 lbs                                         Attempted call to patient and unable to reach.  Left detailed message per DPR regarding transmission. Transmission reviewed.    Corvue Thoracic impedance suggesting normal fluid levels.         Prescribed: Spironolactone 25 mg take 1 tablet daily       Labs: 03/07/2022 Creatinine 1.14, BUN 17, Potassium 4.3, Sodium 137, GFR 65.95 A complete set of results can be found in Results Review.   Recommendations:   Left voice mail with ICM number and encouraged to call if experiencing any fluid symptoms.   Follow-up plan: ICM clinic phone appointment on 02/27/2023.  91 day device clinic remote transmission 04/11/2023.      EP/Cardiology Office Visits:  Pt aware to call office for overdue appointment with Dr Quentin Ore.  Recall 02/20/2021 with Dr Quentin Ore.   Recall 09/19/2022 with Dr Gwenlyn Found.   Copy of ICM check sent to Dr. Quentin Ore.     3 month ICM trend: 01/23/2023.    12-14 Month ICM trend:     Rosalene Billings, RN 01/27/2023 2:17 PM

## 2023-01-31 ENCOUNTER — Other Ambulatory Visit: Payer: Self-pay | Admitting: Family Medicine

## 2023-01-31 DIAGNOSIS — K219 Gastro-esophageal reflux disease without esophagitis: Secondary | ICD-10-CM

## 2023-02-07 ENCOUNTER — Ambulatory Visit (INDEPENDENT_AMBULATORY_CARE_PROVIDER_SITE_OTHER): Payer: Medicare Other | Admitting: Dermatology

## 2023-02-07 ENCOUNTER — Encounter: Payer: Self-pay | Admitting: Dermatology

## 2023-02-07 DIAGNOSIS — X32XXXA Exposure to sunlight, initial encounter: Secondary | ICD-10-CM

## 2023-02-07 DIAGNOSIS — L578 Other skin changes due to chronic exposure to nonionizing radiation: Secondary | ICD-10-CM

## 2023-02-07 DIAGNOSIS — L814 Other melanin hyperpigmentation: Secondary | ICD-10-CM

## 2023-02-07 DIAGNOSIS — W908XXA Exposure to other nonionizing radiation, initial encounter: Secondary | ICD-10-CM | POA: Diagnosis not present

## 2023-02-07 DIAGNOSIS — L2489 Irritant contact dermatitis due to other agents: Secondary | ICD-10-CM

## 2023-02-07 DIAGNOSIS — L570 Actinic keratosis: Secondary | ICD-10-CM

## 2023-02-07 MED ORDER — TACROLIMUS 0.1 % EX OINT: TOPICAL_OINTMENT | Freq: Two times a day (BID) | CUTANEOUS | 0 refills | Status: AC

## 2023-02-07 NOTE — Progress Notes (Signed)
New Patient Visit  Subjective  Jeffery Thomas is a 72 y.o. male who presents for the following: Rash (Has had a rash for about a year. Shaving makes it worse. Has tried vanicream and petroleum jelly which helps a little bit. Heat exposure makes it worse. Steroid cream was prescribed a long time ago. ).  Pt states that ~20 yrs ago he had a very severe / recurring "rash" that affected a large portion of his body.  He was being [facility] by [facility] at that time since he was active in the [employer].  Biopsies were performed however pt cannot recall what the specific diagnosis was.  Everything cleared after a few months long course of oral and topical steroids.  Pt is not sure if the lesions he has today could be related.  He will bring copies of his records at his next visit.     Objective  Well appearing patient in no apparent distress; mood and affect are within normal limits.  All skin waist up examined.  A dermatoscope was used during the exam.  The following people were also present during my examination: , my medical assistant (male)   Right Buccal Cheek Redness and irritation in jaw line areas of recent sahving  Left Buccal Cheek, forehead, forearms, nose (18), Mid Forehead Erythematous thin papules/macules with gritty scale.     Assessment & Plan  Irritant contact dermatitis due to other agents Right Buccal Cheek  -Most likely Secondary to irritating AK's when shaving.  Flares should decrease once the treated AK's resolve in a few weeks.  - Apply Tacrolimus to affected areas on face BID as needed for flares. -Continue vanicream products -Explained to pt that it's not likely that the irritation he's experiencing today is related to the full body rash he experience 20 yrs ago. Pt will bring old record with him at his next visit.  AK (actinic keratosis) (19) Mid Forehead; Left Buccal Cheek, forehead, forearms, nose (18)  ACTINIC DAMAGE WITH PRECANCEROUS ACTINIC  KERATOSES Counseling for Topical Chemotherapy Management: Patient exhibits: - Severe, confluent actinic changes with pre-cancerous actinic keratoses that is secondary to cumulative UV radiation exposure over time - Condition that is severe; chronic, not at goal. - diffuse scaly erythematous macules and papules with underlying dyspigmentation  - Recommend daily broad spectrum sunscreen SPF 30+ to sun-exposed areas, reapply every 2 hours as needed.  - Staying in the shade or wearing long sleeves, sun glasses (UVA+UVB protection) and wide brim hats (4-inch brim around the entire circumference of the hat) are also recommended. - Call for new or changing lesions.   Destruction of lesion - Left Buccal Cheek, forehead, forearms, nose, Mid Forehead Complexity: simple   Destruction method: cryotherapy   Informed consent: discussed and consent obtained   Timeout:  patient name, date of birth, surgical site, and procedure verified Lesion destroyed using liquid nitrogen: Yes   Region frozen until ice ball extended beyond lesion: Yes   Outcome: patient tolerated procedure well with no complications   Post-procedure details: wound care instructions given    LENTIGINES Exam: Tan macules with moth-eaten borders in sun-exposed skin  Counseling:  - Recommend daily broad spectrum sunscreen SPF 30+ to sun-exposed areas, reapply every 2 hours as needed.  - Staying in the shade or wearing long sleeves, sun glasses (UVA+UVB protection) and wide brim hats (4-inch brim around the entire circumference of the hat) are also recommended. - Call for new or changing lesions.   Return in about  6 months (around 08/09/2023) for AK follow up.  I, Zigmund Gottron, CMA, am acting as scribe for Ellard Artis, MD.   Documentation: I have reviewed the above documentation for accuracy and completeness, and I agree with the above  [place], DO

## 2023-02-07 NOTE — Patient Instructions (Signed)
Actinic keratoses are precancerous spots that appear secondary to cumulative UV radiation exposure/sun exposure over time. They are chronic with expected duration over 1 year. A portion of actinic keratoses will progress to squamous cell carcinoma of the skin. It is not possible to reliably predict which spots will progress to skin cancer and so treatment is recommended to prevent development of skin cancer.  Recommend daily broad spectrum sunscreen SPF 30+ to sun-exposed areas, reapply every 2 hours as needed.  Recommend staying in the shade or wearing long sleeves, sun glasses (UVA+UVB protection) and wide brim hats (4-inch brim around the entire circumference of the hat). Call for new or changing lesions.  Prior to procedure, discussed risks of blister formation, small wound, skin dyspigmentation, or rare scar following cryotherapy. Recommend Vaseline ointment to treated areas while healing.   Due to recent changes in healthcare laws, you may see results of your pathology and/or laboratory studies on MyChart before the doctors have had a chance to review them. We understand that in some cases there may be results that are confusing or concerning to you. Please understand that not all results are received at the same time and often the doctors may need to interpret multiple results in order to provide you with the best plan of care or course of treatment. Therefore, we ask that you please give [place] 2 business days to thoroughly review all your results before contacting the office for clarification. Should we see a critical lab result, you will be contacted sooner.   If You Need Anything After Your Visit  If you have any questions or concerns for your doctor, please call our main line at [phone] If no one answers, please leave a voicemail as directed and we will return your call as soon as possible. Messages left after 4 pm will be answered the following business day.   You may also send [place] a  message via [place]. We typically respond to MyChart messages within 1-2 business days.  For prescription refills, please ask your pharmacy to contact our office. Our fax number is [phone].  If you have an urgent issue when the clinic is closed that cannot wait until the next business day, you can page your doctor at the number below.    Please note that while we do our best to be available for urgent issues outside of office hours, we are not available 24/7.   If you have an urgent issue and are unable to reach [place], you may choose to seek medical care at your doctor's office, retail clinic, urgent care center, or emergency room.  If you have a medical emergency, please immediately call 911 or go to the emergency department. In the event of inclement weather, please call our main line at [phone] for an update on the status of any delays or closures.  Dermatology Medication Tips: Please keep the boxes that topical medications come in in order to help keep track of the instructions about where and how to use these. Pharmacies typically print the medication instructions only on the boxes and not directly on the medication tubes.   If your medication is too expensive, please contact our office at [phone] or send [place] a message through [place].   We are unable to tell what your co-pay for medications will be in advance as this is different depending on your insurance coverage. However, we may be able to find a substitute medication at lower cost or fill out paperwork to get insurance to  cover a needed medication.   If a prior authorization is required to get your medication covered by your insurance company, please allow [place] 1-2 business days to complete this process.  Drug prices often vary depending on where the prescription is filled and some pharmacies may offer cheaper prices.  The website www.goodrx.com contains coupons for medications through different pharmacies. The prices here  do not account for what the cost may be with help from insurance (it may be cheaper with your insurance), but the website can give you the price if you did not use any insurance.  - You can print the associated coupon and take it with your prescription to the pharmacy.  - You may also stop by our office during regular business hours and pick up a GoodRx coupon card.  - If you need your prescription sent electronically to a different pharmacy, notify our office through [facility] or by phone at [phone]

## 2023-02-08 ENCOUNTER — Other Ambulatory Visit: Payer: Self-pay | Admitting: Cardiovascular Disease

## 2023-02-15 NOTE — Progress Notes (Signed)
Remote ICD transmission.   

## 2023-02-27 ENCOUNTER — Ambulatory Visit: Payer: Medicare Other | Attending: Cardiology

## 2023-02-27 DIAGNOSIS — I428 Other cardiomyopathies: Secondary | ICD-10-CM | POA: Diagnosis not present

## 2023-02-27 DIAGNOSIS — Z95810 Presence of automatic (implantable) cardiac defibrillator: Secondary | ICD-10-CM

## 2023-03-03 NOTE — Progress Notes (Signed)
EPIC Encounter for ICM Monitoring  Patient Name: Jeffery Thomas is a 73 y.o. male Date: 03/03/2023 Primary Care Physican: Janeece Agee, NP Primary Cardiologist: Allyson Sabal Electrophysiologist: Lalla Brothers 01/04/2023 office Weight: 210 lbs                                         Transmission reviewed.    Corvue Thoracic impedance suggesting normal fluid levels with exception of possible fluid accumulation from 4/15-4/21.         Prescribed: Spironolactone 25 mg take 1 tablet daily       Labs: 03/07/2022 Creatinine 1.14, BUN 17, Potassium 4.3, Sodium 137, GFR 65.95 A complete set of results can be found in Results Review.   Recommendations:   No changes.   Follow-up plan: ICM clinic phone appointment on 04/04/2023.  91 day device clinic remote transmission 04/11/2023.      EP/Cardiology Office Visits:  Recall 12/30/2023 with Dr Lalla Brothers.   05/09/2023 with Dr Allyson Sabal.   Copy of ICM check sent to Dr. Lalla Brothers.      3 month ICM trend: 02/27/2023.    12-14 Month ICM trend:     Karie Soda, RN 03/03/2023 3:22 PM

## 2023-04-04 ENCOUNTER — Ambulatory Visit: Payer: Medicare Other | Attending: Cardiology

## 2023-04-04 DIAGNOSIS — Z95810 Presence of automatic (implantable) cardiac defibrillator: Secondary | ICD-10-CM | POA: Diagnosis not present

## 2023-04-04 DIAGNOSIS — I428 Other cardiomyopathies: Secondary | ICD-10-CM

## 2023-04-07 ENCOUNTER — Telehealth: Payer: Self-pay

## 2023-04-07 NOTE — Progress Notes (Signed)
EPIC Encounter for ICM Monitoring  Patient Name: Jeffery Thomas is a 72 y.o. male Date: 04/07/2023 Primary Care Physican: Janeece Agee, NP Primary Cardiologist: Allyson Sabal Electrophysiologist: Lalla Brothers 01/04/2023 office Weight: 210 lbs                                         Attempted call to patient and unable to reach.  Left detailed message per DPR regarding transmission. Transmission reviewed.    Corvue Thoracic impedance suggesting normal fluid levels with exception of possible fluid accumulation from 5/14-5/22.         Prescribed: Spironolactone 25 mg take 1 tablet daily       Labs: 03/07/2022 Creatinine 1.14, BUN 17, Potassium 4.3, Sodium 137, GFR 65.95 A complete set of results can be found in Results Review.   Recommendations:   Left voice mail with ICM number and encouraged to call if experiencing any fluid symptoms.   Follow-up plan: ICM clinic phone appointment on 05/08/2023.  91 day device clinic remote transmission 04/11/2023.      EP/Cardiology Office Visits:  Recall 12/30/2023 with Dr Lalla Brothers.   05/09/2023 with Dr Allyson Sabal.   Copy of ICM check sent to Dr. Lalla Brothers.    3 month ICM trend: 04/04/2023.    12-14 Month ICM trend:     Karie Soda, RN 04/07/2023 2:35 PM

## 2023-04-07 NOTE — Telephone Encounter (Signed)
Remote ICM transmission received.  Attempted call to patient regarding ICM remote transmission and left detailed message per DPR.  Left ICM phone number and advised to return call for any fluid symptoms or questions. Next ICM remote transmission scheduled 05/08/2023.    

## 2023-04-11 ENCOUNTER — Ambulatory Visit (INDEPENDENT_AMBULATORY_CARE_PROVIDER_SITE_OTHER): Payer: Medicare Other

## 2023-04-11 DIAGNOSIS — I4901 Ventricular fibrillation: Secondary | ICD-10-CM | POA: Diagnosis not present

## 2023-04-11 LAB — CUP PACEART REMOTE DEVICE CHECK
Battery Remaining Longevity: 65 mo
Battery Remaining Percentage: 62 %
Battery Voltage: 2.96 V
Brady Statistic RV Percent Paced: 1 %
Date Time Interrogation Session: 20240604020016
HighPow Impedance: 84 Ohm
HighPow Impedance: 84 Ohm
Implantable Lead Connection Status: 753985
Implantable Lead Implant Date: 20180828
Implantable Lead Location: 753860
Implantable Pulse Generator Implant Date: 20190915
Lead Channel Impedance Value: 380 Ohm
Lead Channel Pacing Threshold Amplitude: 1 V
Lead Channel Pacing Threshold Pulse Width: 0.5 ms
Lead Channel Sensing Intrinsic Amplitude: 11.9 mV
Lead Channel Setting Pacing Amplitude: 2.5 V
Lead Channel Setting Pacing Pulse Width: 0.5 ms
Lead Channel Setting Sensing Sensitivity: 0.5 mV
Pulse Gen Serial Number: 9851932

## 2023-05-04 NOTE — Progress Notes (Signed)
Remote ICD transmission.   

## 2023-05-08 ENCOUNTER — Ambulatory Visit: Payer: Medicare Other | Attending: Cardiology

## 2023-05-08 DIAGNOSIS — I428 Other cardiomyopathies: Secondary | ICD-10-CM | POA: Diagnosis not present

## 2023-05-09 ENCOUNTER — Ambulatory Visit: Payer: Medicare Other | Admitting: Cardiovascular Disease

## 2023-05-12 NOTE — Progress Notes (Signed)
EPIC Encounter for ICM Monitoring  Patient Name: Jeffery Thomas is a 72 y.o. male Date: 05/12/2023 Primary Care Physican: Janeece Agee, NP Primary Cardiologist: Allyson Sabal Electrophysiologist: Lalla Brothers 01/04/2023 office Weight: 210 lbs                                         Transmission reviewed.    Corvue Thoracic impedance suggesting normal fluid levels within the last month.         Prescribed: Spironolactone 25 mg take 1 tablet daily       Labs: 03/07/2022 Creatinine 1.14, BUN 17, Potassium 4.3, Sodium 137, GFR 65.95 A complete set of results can be found in Results Review.   Recommendations:   No changes.   Follow-up plan: ICM clinic phone appointment on 06/12/2023.  91 day device clinic remote transmission 07/11/2023.      EP/Cardiology Office Visits:  Recall 12/30/2023 with Dr Lalla Brothers.  08/23/2023 with Dr Allyson Sabal.   Copy of ICM check sent to Dr. Lalla Brothers.    3 month ICM trend: 05/08/2023.    12-14 Month ICM trend:     Karie Soda, RN 05/12/2023 1:52 PM

## 2023-06-12 ENCOUNTER — Ambulatory Visit (INDEPENDENT_AMBULATORY_CARE_PROVIDER_SITE_OTHER): Payer: Medicare Other | Admitting: Family Medicine

## 2023-06-12 ENCOUNTER — Encounter: Payer: Self-pay | Admitting: Family Medicine

## 2023-06-12 ENCOUNTER — Ambulatory Visit: Payer: Medicare Other | Attending: Cardiology

## 2023-06-12 VITALS — BP 130/70 | HR 63 | Ht 72.0 in | Wt 216.0 lb

## 2023-06-12 DIAGNOSIS — R7303 Prediabetes: Secondary | ICD-10-CM | POA: Diagnosis not present

## 2023-06-12 DIAGNOSIS — Z7689 Persons encountering health services in other specified circumstances: Secondary | ICD-10-CM | POA: Diagnosis not present

## 2023-06-12 DIAGNOSIS — Z95810 Presence of automatic (implantable) cardiac defibrillator: Secondary | ICD-10-CM

## 2023-06-12 DIAGNOSIS — K219 Gastro-esophageal reflux disease without esophagitis: Secondary | ICD-10-CM | POA: Diagnosis not present

## 2023-06-12 DIAGNOSIS — I428 Other cardiomyopathies: Secondary | ICD-10-CM

## 2023-06-12 LAB — HEMOGLOBIN A1C: Hgb A1c MFr Bld: 6.7 % — ABNORMAL HIGH (ref 4.6–6.5)

## 2023-06-12 MED ORDER — OMEPRAZOLE 40 MG PO CPDR
40.0000 mg | DELAYED_RELEASE_CAPSULE | Freq: Every day | ORAL | 3 refills | Status: DC
Start: 2023-06-12 — End: 2024-03-15

## 2023-06-12 NOTE — Progress Notes (Signed)
New Patient Office Visit  Subjective    Patient ID: Jeffery Thomas, male    DOB: 09-17-1951  Age: 72 y.o. MRN: [id number]  CC:  Chief Complaint  Patient presents with   Establish Care    New pt transfer or care from Janeece Agee. Needs refills on acid reflux meds.    HPI Jeffery Thomas presents to establish care with new provider.   Patients previous primary care provider was Janeece Agee, NP. Last visit was 03/07/2022.  Specialist: [facility] Dermatologist with Dr. Langston Reusing  [facility] with Dr. Nanetta Batty   GERD: Chronic. Patient is taking Omeprazole 40mg  daily. Effective.  Prediabetes: Patient was taking Metformin, but has not took medication in over a year. He reports he was told he didn't need medication. Last A1c was 5.9 on 03/07/2022. Based on last note with Kateri Plummer, NP, he was suppose to continue Metformin.   Outpatient Encounter Medications as of 06/12/2023  Medication Sig   atorvastatin (LIPITOR) 20 MG tablet TAKE 1 TABLET DAILY   carvedilol (COREG) 12.5 MG tablet Take 1 tablet (12.5 mg total) by mouth 2 (two) times daily with a meal.   losartan (COZAAR) 100 MG tablet TAKE 1 TABLET DAILY   spironolactone (ALDACTONE) 25 MG tablet TAKE 1 TABLET DAILY   tacrolimus (PROTOPIC) 0.1 % ointment Apply topically 2 (two) times daily. As needed for irritation on face   [DISCONTINUED] omeprazole (PRILOSEC) 40 MG capsule Take 1 capsule (40 mg total) by mouth daily.   omeprazole (PRILOSEC) 40 MG capsule Take 1 capsule (40 mg total) by mouth daily.   [DISCONTINUED] meloxicam (MOBIC) 15 MG tablet TAKE 1 TABLET DAILY AS NEEDED. (Patient not taking: Reported on 06/12/2023)   [DISCONTINUED] metFORMIN (GLUCOPHAGE XR) 500 MG 24 hr tablet Take 1 tablet (500 mg total) by mouth at bedtime.   No facility-administered encounter medications on file as of 06/12/2023.    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   premature generator change 07-22-2018--  followed by dr allred  ([facility] [place])   Inguinal hernia    left   Left hydrocele    Left inguinal hernia    Left inguinal hernia 05/07/2019   Myocardial infarction [facility])    NICM (nonischemic cardiomyopathy) (HCC)    06-26-2017  per cardiac cath , ef <20% (echo 35-40%)  ;  last echo 10-09-2014 ef 60-65%   OSA (obstructive sleep apnea)    03-26-2019 per pt had sleep study after heart attack, was told no recommendation , but did stop smoking and alcohol   Pre-diabetes    denies   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   sudden cardiac arrest w/ VF and STEMI,  s/p  ICD 07-04-2017    Past Surgical History:  Procedure Laterality Date   CATARACT EXTRACTION W/ INTRAOCULAR LENS IMPLANT Left 2017  COLONOSCOPY     HYDROCELE EXCISION Left 04/02/2019   Procedure: HYDROCELECTOMY ADULT;  Surgeon: Jerilee Field, MD;  Location: [facility];  Service: Urology;  Laterality: Left;   ICD GENERATOR CHANGEOUT N/A 07/22/2018   Procedure: ICD GENERATOR CHANGEOUT;  Surgeon: Marinus Maw, MD;  Location: [facility] INVASIVE CV LAB;  Service: Cardiovascular;  Laterality: N/A;   ICD IMPLANT N/A 07/04/2017   SJM Fortify Assura VR ICD implanted by Dr Johney Frame for secondary prevention after VF arrest   INGUINAL HERNIA REPAIR Left 05/07/2019   Procedure: OPEN REPAIR LEFT INGUINAL HERNIA WITH MESH;   Surgeon: Claud Kelp, MD;  Location: [facility] OR;  Service: General;  Laterality: Left;   LEFT HEART CATH AND CORONARY ANGIOGRAPHY N/A 06/26/2017   Procedure: LEFT HEART CATH AND CORONARY ANGIOGRAPHY;  Surgeon: Runell Gess, MD;  Location: [facility] INVASIVE CV LAB;  Service: Cardiovascular;  Laterality: N/A;   MENISCUS REPAIR Left 1983   left knee   TOTAL HIP ARTHROPLASTY Right 09/18/2019   Procedure: TOTAL HIP ARTHROPLASTY ANTERIOR APPROACH;  Surgeon: Ollen Gross, MD;  Location: [facility] ORS;  Service: Orthopedics;  Laterality: Right;   TOTAL KNEE ARTHROPLASTY Left 2009  approx.   TOTAL KNEE REVISION Left 04/18/2018   Procedure: LEFT TOTAL KNEE REVISION;  Surgeon: Ollen Gross, MD;  Location: [facility] ORS;  Service: Orthopedics;  Laterality: Left;  Adductor Block    Family History  Problem Relation Age of Onset   Hypertension Mother    Hyperlipidemia Father    Hypertension Father    Diabetes Father    Hypertension Brother    Cancer Brother        Pancreatic   Stroke Paternal Grandmother     Social History   Socioeconomic History   Marital status: Married    Spouse name: Not on file   Number of children: 1   Years of education: Not on file   Highest education level: Some college, no degree  Occupational History   Not on file  Tobacco Use   Smoking status: Former    Types: Cigars    Quit date: 06/26/2017    Years since quitting: 5.9   Smokeless tobacco: Never  Vaping Use   Vaping status: Never Used  Substance and Sexual Activity   Alcohol use: Not Currently    Alcohol/week: 6.0 standard drinks of alcohol    Types: 6 Shots of liquor per week    Comment: Quit: 2020   Drug use: Never   Sexual activity: Yes  Other Topics Concern   Not on file  Social History Narrative   Not on file   Social Determinants of Health   Financial Resource Strain: Low Risk  (06/29/2021)   Overall Financial Resource Strain (CARDIA)    Difficulty of Paying Living Expenses: Not hard at all  Food  Insecurity: No Food Insecurity (06/29/2021)   Hunger Vital Sign    Worried About Running Out of Food in the Last Year: Never true    Ran Out of Food in the Last Year: Never true  Transportation Needs: No Transportation Needs (06/29/2021)   PRAPARE - Administrator, Civil Service (Medical): No    Lack of Transportation (Non-Medical): No  Physical Activity: Sufficiently Active (06/12/2023)   Exercise Vital Sign    Days of Exercise per Week: 6 days    Minutes of Exercise per Session: 120 min  Stress: No Stress Concern Present (06/29/2021)   [employer] of Occupational Health - Occupational Stress Questionnaire  Feeling of Stress : Not at all  Social Connections: Moderately Integrated (06/29/2021)   Social Connection and Isolation Panel [NHANES]    Frequency of Communication with Friends and Family: Twice a week    Frequency of Social Gatherings with Friends and Family: More than three times a week    Attends Religious Services: Never    Database administrator or Organizations: Yes    Attends Engineer, structural: More than 4 times per year    Marital Status: Married  Catering manager Violence: Not At Risk (06/29/2021)   Humiliation, Afraid, Rape, and Kick questionnaire    Fear of Current or Ex-Partner: No    Emotionally Abused: No    Physically Abused: No    Sexually Abused: No    ROS See HPI above    Objective    BP 130/70   Pulse 63   Ht 6' (1.829 m)   Wt 216 lb (98 kg)   SpO2 97%   BMI 29.29 kg/m   Physical Exam Vitals reviewed.  Constitutional:      General: He is not in acute distress.    Appearance: Normal appearance. He is not ill-appearing, toxic-appearing or diaphoretic.  HENT:     Head: Normocephalic and atraumatic.  Eyes:     General:        Right eye: No discharge.        Left eye: No discharge.     Conjunctiva/sclera: Conjunctivae normal.  Cardiovascular:     Rate and Rhythm: Normal rate and regular rhythm.     Heart sounds:  Normal heart sounds. No murmur heard.    No friction rub. No gallop.  Pulmonary:     Effort: Pulmonary effort is normal. No respiratory distress.     Breath sounds: Normal breath sounds.  Musculoskeletal:        General: Normal range of motion.     Right lower leg: No edema.     Left lower leg: No edema.  Skin:    General: Skin is warm and dry.  Neurological:     General: No focal deficit present.     Mental Status: He is alert and oriented to person, place, and time. Mental status is at baseline.  Psychiatric:        Mood and Affect: Mood normal.        Behavior: Behavior normal.        Thought Content: Thought content normal.        Judgment: Judgment normal.      Assessment & Plan:  Gastroesophageal reflux disease, unspecified whether esophagitis present Assessment & Plan: Continue Omeprazole 40mg  daily since it is effective.   Orders: -     Omeprazole; Take 1 capsule (40 mg total) by mouth daily.  Dispense: 90 capsule; Refill: 3  Prediabetes Assessment & Plan: Not on any medications. Previously taking Metformin. Ordered A1c.   Orders: -     Hemoglobin A1c  Encounter to establish care   1.Review health maintenance:  -PNA vaccine-[facility] in [place]  -Needs AWV visit  -Covid vaccine-2 initial covid vaccine with 2 boosters;  -Shingles vaccine-recommend to get vaccine at local pharmacy  -Influenza vaccine-recommend to get vaccine at local pharmacy  Return in about 1 year (around 06/11/2024) for chronic management; AWV visit in office .   Zandra Abts, NP

## 2023-06-12 NOTE — Patient Instructions (Signed)
-  It was a pleasure to meet you today and look forward to taking care of you. -Refilled Omeprazole for GERD.  -Ordered A1c for prediabetes. Office will call with lab results and you may the results in MyChart.  -Follow up in 1 year for chronic management and a AWV visit in office for first time.

## 2023-06-12 NOTE — Assessment & Plan Note (Signed)
Not on any medications. Previously taking Metformin. Ordered A1c.

## 2023-06-12 NOTE — Assessment & Plan Note (Signed)
Continue Omeprazole 40mg  daily since it is effective.

## 2023-06-13 ENCOUNTER — Other Ambulatory Visit: Payer: Self-pay

## 2023-06-13 ENCOUNTER — Telehealth: Payer: Self-pay

## 2023-06-13 ENCOUNTER — Other Ambulatory Visit: Payer: Self-pay | Admitting: Family Medicine

## 2023-06-13 DIAGNOSIS — R7303 Prediabetes: Secondary | ICD-10-CM

## 2023-06-13 MED ORDER — METFORMIN HCL ER 500 MG PO TB24
500.0000 mg | ORAL_TABLET | Freq: Every day | ORAL | 3 refills | Status: DC
Start: 2023-06-13 — End: 2023-06-13

## 2023-06-13 MED ORDER — METFORMIN HCL ER 500 MG PO TB24
500.0000 mg | ORAL_TABLET | Freq: Every day | ORAL | Status: DC
Start: 2023-06-13 — End: 2023-06-13

## 2023-06-13 MED ORDER — METFORMIN HCL ER 500 MG PO TB24
500.0000 mg | ORAL_TABLET | Freq: Every day | ORAL | 3 refills | Status: DC
Start: 2023-06-13 — End: 2023-10-27

## 2023-06-13 MED ORDER — METFORMIN HCL ER 500 MG PO TB24: 500.0000 mg | ORAL_TABLET | Freq: Every day | ORAL | 2 refills | Status: DC

## 2023-06-13 NOTE — Progress Notes (Signed)
Patient has agreed to taking Metformin XR 500mg , 1 tablet once a day.

## 2023-06-13 NOTE — Progress Notes (Signed)
EPIC Encounter for ICM Monitoring  Patient Name: Jeffery Thomas is a 72 y.o. male Date: 06/13/2023 Primary Care Physican: Alveria Apley, NP Primary Cardiologist: Allyson Sabal Electrophysiologist: Lalla Brothers 01/04/2023 office Weight: 210 lbs     06/12/2023 Weight: 220 lbs                                     Spoke with patient and heart failure questions reviewed.  Transmission results reviewed.  Pt asymptomatic for fluid accumulation.  Reports feeling well at this time and voices no complaints.    Diet:  Consuming more salt than realized in diet such as chips, cheese etc..   Corvue Thoracic impedance suggesting possible fluid accumulation starting 7/27 but trending back toward baseline.         Prescribed: Spironolactone 25 mg take 1 tablet daily       Labs: 03/07/2022 Creatinine 1.14, BUN 17, Potassium 4.3, Sodium 137, GFR 65.95 A complete set of results can be found in Results Review.   Recommendations:  Recommendation to limit salt intake to 2000 mg daily and fluid intake to 64 oz daily.  Encouraged to call if experiencing any fluid symptoms.    Follow-up plan: ICM clinic phone appointment on 06/19/2023 to recheck fluid levels.  91 day device clinic remote transmission 07/11/2023.      EP/Cardiology Office Visits:  Recall 12/30/2023 with Dr Lalla Brothers.  08/23/2023 with Dr Allyson Sabal.   Copy of ICM check sent to Dr. Lalla Brothers.  Copy to Dr Allyson Sabal as Lorain Childes.    3 month ICM trend: 06/12/2023.    12-14 Month ICM trend:     Karie Soda, RN 06/13/2023 12:08 PM

## 2023-06-13 NOTE — Telephone Encounter (Signed)
-----   Message from Zandra Abts sent at 06/12/2023  4:32 PM EDT ----- Your A1c is 6.7, considered diabetes and need to managed. Would you like to restart Metformin for diabetes management? If so, I recommend Metformin 500mg  XR, 1 tablet daily with a follow up visit in 3 months.

## 2023-06-13 NOTE — Telephone Encounter (Signed)
Rx has been sent in. 

## 2023-06-17 ENCOUNTER — Telehealth: Payer: Self-pay | Admitting: Student

## 2023-06-17 NOTE — Telephone Encounter (Signed)
  Patient's wife called Answering Service reporting that patient has COVID and a fever.  Called and spoke with patient's wife.  She states patient started not feeling well last night with bodyaches and chills.  He woke up this morning and had a mild fever.  He has tested positive for COVID twice on home tests. They called and spoke with PCP who advised taking Tylenol and calling us.  I explained that normally we defer the treatment of COVID to the PCP.  However, discussed that as long as patient is not having any trouble breathing and fevers are able to be controlled with Tylenol, okay to stay home and focus on conservative management.  Wife states that patient is not having any shortness of breath or chest pain.  Given underlying cardiac disease, recommended Tylenol over NSAIDs for management of fever.  Advised following up with PCP early next week or at an urgent care/ED over the weekend if unable to control fever or he develops any significant shortness of breath.  Wife voiced understanding and thanked me for calling.  Corrin Parker, PA-C 06/17/2023 8:43 AM

## 2023-06-19 ENCOUNTER — Ambulatory Visit: Payer: Medicare Other

## 2023-06-19 ENCOUNTER — Telehealth: Payer: Self-pay | Admitting: Family Medicine

## 2023-06-19 DIAGNOSIS — I428 Other cardiomyopathies: Secondary | ICD-10-CM

## 2023-06-19 DIAGNOSIS — Z95810 Presence of automatic (implantable) cardiac defibrillator: Secondary | ICD-10-CM

## 2023-06-19 NOTE — Telephone Encounter (Signed)
Patient Name First: Roseanne Reno Last: Cavan Gender: Male DOB: Aug 06, 1951 Age: 72 Y 26 D Return Phone Number: [phone] (Primary) Address: City/ State/ Zip: [place]  [address] Client [facility] [place] Night - C Client Site [facility] [place] - Night Contact Type Call Who Is Calling Patient / Member / Family / Caregiver Call Type Triage / Clinical Caller Name Nidin Marcy Relationship To Patient Spouse Return Phone Number [phone] (Primary) Chief Complaint Fever (non-urgent symptom) (greater than THREE MONTHS old) Reason for Call Symptomatic / Request for Health Information Initial Comment Caller states her husband has a fever, she is not sure how high it is, also he has body aches. GOTO Facility Not Listed Calling cardiologist Translation No Nurse Assessment Nurse: Alvester Morin, RN, Marcelino Duster Date/Time (Eastern Time): 06/17/2023 6:47:22 AM Confirm and document reason for call. If symptomatic, describe symptoms. ---Husband has a fever, she is not sure how high it is, also he has body aches. Didn't feel well yesterday but no fever at that time. Now with fever. No known exposures. She gave him some Tylenol. Slight cough. Does the patient have any new or worsening symptoms? ---Yes Will a triage be completed? ---Yes Related visit to physician within the last 2 weeks? ---No Does the PT have any chronic conditions? (i.e. diabetes, asthma, this includes High risk factors for pregnancy, etc.) ---Yes List chronic conditions. ---Has defib placed Is this a behavioral health or substance abuse call? ---No Guidelines Guideline Title Affirmed Question Affirmed Notes Nurse Date/Time (Eastern Time) Fever [1] Fever AND [2] no signs of serious infection or localizing symptoms (all other Alvester Morin, RN, Marcelino Duster 06/17/2023 6:50:46 AM PLEASE NOTE: All timestamps contained within this report are represented as [place] Standard  Time. CONFIDENTIALTY NOTICE: This fax transmission is intended only for the addressee. It contains information that is legally privileged, confidential or otherwise protected from use or disclosure. If you are not the intended recipient, you are strictly prohibited from reviewing, disclosing, copying using or disseminating any of this information or taking any action in reliance on or regarding this information. If you have received this fax in error, please notify us immediately by telephone so that we can arrange for its return to [place]. Phone: [phone], Toll-Free: [phone], Fax: [phone] Page: 2 of 3 Call Id: [id number] Guidelines Guideline Title Affirmed Question Affirmed Notes Nurse Date/Time Lamount Cohen Time) triage questions negative) COVID-19 - Diagnosed or Suspected [1] HIGH RISK patient (e.g., weak immune system, age > 64 years, obesity with BMI 30 or higher, pregnant, chronic lung disease or other chronic medical condition) AND [2] COVID symptoms (e.g., cough, fever) (Exceptions: Already seen by PCP and no new or worsening symptoms.) Alvester Morin, RN, Marcelino Duster 06/17/2023 6:57:24 AM Disp. Time Lamount Cohen Time) Disposition Final User 06/17/2023 6:57:08 AM Home Care Alvester Morin, RN, Marcelino Duster 06/17/2023 6:58:06 AM Call PCP within 24 Hours Yes Alvester Morin, RN, Marcelino Duster Final Disposition 06/17/2023 6:58:06 AM Call PCP within 24 Hours Yes Alvester Morin, RN, Lavon Paganini Disagree/Comply Comply Caller Understands Yes PreDisposition Call Doctor Care Advice Given Per Guideline HOME CARE: * You should be able to treat this at home. CARE ADVICE given per Fever (Adult) guideline. CALL PCP WITHIN 24 HOURS: * You need to discuss this with your doctor (or NP/PA) within the next 24 hours. * IF OFFICE WILL BE CLOSED: I'll page the on-call provider now. EXCEPTION: from 9 pm to 9 am. Since this isn't urgent, we'll hold the page until morning. CALL BACK IF: * You become worse CARE ADVICE given per COVID-19 -  DIAGNOSED OR SUSPECTED (Adult) guideline. PLEASE NOTE: All timestamps contained within this report are represented as [place] Standard Time. CONFIDENTIALTY NOTICE: This fax transmission is intended only for the addressee. It contains information that is legally privileged, confidential or otherwise protected from use or disclosure. If you are not the intended recipient, you are strictly prohibited from reviewing, disclosing, copying using or disseminating any of this information or taking any action in reliance on or regarding this information. If you have received this fax in error, please notify us immediately by telephone so that we can arrange for its return to [place]. Phone: [phone], Toll-Free: [phone], Fax: [phone] Page: 3 of 3 Call Id: [id number] Comments User: Len Childs, RN Date/Time Lamount Cohen Time): 06/17/2023 7:00:56 AM At end of triage for fever, spouse advised she did do an at home COVID test last night and it was positive but barely positive and due to possible test being expired, she was going to go purchase a thermometer and new COVID test to retest this morning. Advised caller if he is positive on new test, or running temp over 100, contact us or cardiology to see if they have an on call for possible anti viral/treatment due to office here not being able to call in anti viral outside of office hours. Referrals GO TO FACILITY OTHER - [facility]  Will call Pt this morning for update.

## 2023-06-19 NOTE — Telephone Encounter (Signed)
Spoke with pts wife and pt did test postive for COVID 06/16/2023 Pt reports he is feeling and does not need appt.

## 2023-06-21 ENCOUNTER — Ambulatory Visit (INDEPENDENT_AMBULATORY_CARE_PROVIDER_SITE_OTHER): Payer: Medicare Other | Admitting: *Deleted

## 2023-06-21 DIAGNOSIS — Z0000 Encounter for general adult medical examination without abnormal findings: Secondary | ICD-10-CM

## 2023-06-21 NOTE — Progress Notes (Signed)
Subjective:   Jeffery Thomas is a 72 y.o. male who presents for an Initial Medicare Annual Wellness Visit.  Visit Complete: Virtual  I connected with  Jeffery Thomas on 06/21/23 by a audio enabled telemedicine application and verified that I am speaking with the correct person using two identifiers.  Patient Location: Home  Provider Location: Home Office  I discussed the limitations of evaluation and management by telemedicine. The patient expressed understanding and agreed to proceed.  Patient Medicare AWV questionnaire was completed by the patient on 06-18-2023; I have confirmed that all information answered by patient is correct and no changes since this date.  Vital Signs: Unable to obtain new vitals due to this being a telehealth visit.   Review of Systems     Cardiac Risk Factors include: advanced age (>65men, >24 women);male gender;family history of premature cardiovascular disease;hypertension     Objective:    There were no vitals filed for this visit. There is no height or weight on file to calculate BMI.     06/21/2023    2:56 PM 06/29/2021    9:57 AM 06/04/2020    9:05 AM 09/18/2019    1:00 PM 09/12/2019   10:11 AM 05/02/2019   10:32 AM 04/02/2019    5:53 AM  Advanced Directives  Does Patient Have a Medical Advance Directive? Yes Yes Yes Yes Yes Yes Yes  Type of Estate agent of [employer] Power of [place];Living will Living will Healthcare Power of [place];Living will Healthcare Power of [place];Living will Healthcare Power of [place];Living will Healthcare Power of [place];Living will  Does patient want to make changes to medical advance directive?    No - Patient declined No - Patient declined No - Patient declined   Copy of Healthcare Power of Attorney in Chart? No - copy requested No - copy requested   No - copy requested No - copy requested No - copy requested    Current Medications (verified) Outpatient Encounter  Medications as of 06/21/2023  Medication Sig   atorvastatin (LIPITOR) 20 MG tablet TAKE 1 TABLET DAILY   carvedilol (COREG) 12.5 MG tablet Take 1 tablet (12.5 mg total) by mouth 2 (two) times daily with a meal.   losartan (COZAAR) 100 MG tablet TAKE 1 TABLET DAILY   metFORMIN (GLUCOPHAGE-XR) 500 MG 24 hr tablet Take 1 tablet (500 mg total) by mouth daily with breakfast.   omeprazole (PRILOSEC) 40 MG capsule Take 1 capsule (40 mg total) by mouth daily.   spironolactone (ALDACTONE) 25 MG tablet TAKE 1 TABLET DAILY   tacrolimus (PROTOPIC) 0.1 % ointment Apply topically 2 (two) times daily. As needed for irritation on face   No facility-administered encounter medications on file as of 06/21/2023.    Allergies (verified) Patient has no known allergies.   History: Past Medical History:  Diagnosis Date   AICD (automatic cardioverter/defibrillator) present    Chronic systolic CHF (congestive heart failure) (HCC)    Gastroesophageal reflux disease    History of alcohol abuse    quit 08/ 20/ 2018   History of cardiac arrest    History of encephalopathy 07/05/2017   anoxic-ischemic    History of ST elevation myocardial infarction (STEMI) 06/26/2017   sudden cardiac arrest /  per cardiac cath 06-26-2017 normal coronaries, ef <20% , severe LVSD; dx takatsuki syndrome   History of sudden cardiac arrest 06/26/2017   VF arrest / STEMI    Hypertension    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  premature generator change 07-22-2018--  followed by dr allred  ([facility] [place])   Inguinal hernia    left   Left hydrocele    Left inguinal hernia    Left inguinal hernia 05/07/2019   Myocardial infarction [facility])    NICM (nonischemic cardiomyopathy) (HCC)    06-26-2017  per cardiac cath , ef <20% (echo 35-40%)  ;  last echo 10-09-2014 ef 60-65%   OSA (obstructive sleep apnea)    03-26-2019 per pt had sleep study after heart attack, was told no recommendation , but did stop  smoking and alcohol   Pre-diabetes    denies   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   sudden cardiac arrest w/ VF and STEMI,  s/p  ICD 07-04-2017   Past Surgical History:  Procedure Laterality Date   CATARACT EXTRACTION W/ INTRAOCULAR LENS IMPLANT Left 2017   COLONOSCOPY     HYDROCELE EXCISION Left 04/02/2019   Procedure: HYDROCELECTOMY ADULT;  Surgeon: Jerilee Field, MD;  Location: [facility];  Service: Urology;  Laterality: Left;   ICD GENERATOR CHANGEOUT N/A 07/22/2018   Procedure: ICD GENERATOR CHANGEOUT;  Surgeon: Marinus Maw, MD;  Location: [facility] INVASIVE CV LAB;  Service: Cardiovascular;  Laterality: N/A;   ICD IMPLANT N/A 07/04/2017   SJM Fortify Assura VR ICD implanted by Dr Johney Frame for secondary prevention after VF arrest   INGUINAL HERNIA REPAIR Left 05/07/2019   Procedure: OPEN REPAIR LEFT INGUINAL HERNIA WITH MESH;  Surgeon: Claud Kelp, MD;  Location[facility] OR;  Service: General;  Laterality: Left;   LEFT HEART CATH AND CORONARY ANGIOGRAPHY N/A 06/26/2017   Procedure: LEFT HEART CATH AND CORONARY ANGIOGRAPHY;  Surgeon: Runell Gess, MD;  Location: [facility] INVASIVE CV LAB;  Service: Cardiovascular;  Laterality: N/A;   MENISCUS REPAIR Left 1983   left knee   TOTAL HIP ARTHROPLASTY Right 09/18/2019   Procedure: TOTAL HIP ARTHROPLASTY ANTERIOR APPROACH;  Surgeon: Ollen Gross, MD;  Location: [facility] ORS;  Service: Orthopedics;  Laterality: Right;   TOTAL KNEE ARTHROPLASTY Left 2009  approx.   TOTAL KNEE REVISION Left 04/18/2018   Procedure: LEFT TOTAL KNEE REVISION;  Surgeon: Ollen Gross, MD;  Location: [facility] ORS;  Service: Orthopedics;  Laterality: Left;  Adductor Block   Family History  Problem Relation Age of Onset   Hypertension Mother    Hyperlipidemia Father    Hypertension Father    Diabetes Father    Hypertension Brother    Cancer Brother        Pancreatic   Stroke Paternal Grandmother    Social History   Socioeconomic History   Marital status: Married     Spouse name: Not on file   Number of children: 1   Years of education: Not on file   Highest education level: Some college, no degree  Occupational History   Not on file  Tobacco Use   Smoking status: Former    Types: Cigars    Quit date: 06/26/2017    Years since quitting: 5.9   Smokeless tobacco: Never  Vaping Use   Vaping status: Never Used  Substance and Sexual Activity   Alcohol use: Not Currently    Alcohol/week: 6.0 standard drinks of alcohol    Types: 6 Shots of liquor per week    Comment: Quit: 2020   Drug use: Never   Sexual activity: Yes  Other Topics Concern   Not on file  Social History Narrative   Not on file   Social Determinants of Health  Financial Resource Strain: Low Risk  (06/21/2023)   Overall Financial Resource Strain (CARDIA)    Difficulty of Paying Living Expenses: Not hard at all  Food Insecurity: No Food Insecurity (06/21/2023)   Hunger Vital Sign    Worried About Running Out of Food in the Last Year: Never true    Ran Out of Food in the Last Year: Never true  Transportation Needs: No Transportation Needs (06/29/2021)   PRAPARE - Administrator, Civil Service (Medical): No    Lack of Transportation (Non-Medical): No  Physical Activity: Sufficiently Active (06/21/2023)   Exercise Vital Sign    Days of Exercise per Week: 6 days    Minutes of Exercise per Session: 70 min  Stress: No Stress Concern Present (06/21/2023)   [employer] of Occupational Health - Occupational Stress Questionnaire    Feeling of Stress : Not at all  Social Connections: Moderately Integrated (06/21/2023)   Social Connection and Isolation Panel [NHANES]    Frequency of Communication with Friends and Family: Twice a week    Frequency of Social Gatherings with Friends and Family: Once a week    Attends Religious Services: Never    Database administrator or Organizations: No    Attends Engineer, structural: 1 to 4 times per year    Marital Status:  Married    Tobacco Counseling Counseling given: Not Answered   Clinical Intake:  Pre-visit preparation completed: Yes  Pain : No/denies pain     Diabetes: No  How often do you need to have someone help you when you read instructions, pamphlets, or other written materials from your doctor or pharmacy?: 1 - Never  Interpreter Needed?: No  Information entered by :: Remi Haggard LPN   Activities of Daily Living    06/21/2023    2:57 PM 06/18/2023   12:29 PM  In your present state of health, do you have any difficulty performing the following activities:  Hearing? 0 0  Vision? 0 0  Difficulty concentrating or making decisions? 0 0  Walking or climbing stairs? 0 0  Dressing or bathing? 0 0  Doing errands, shopping? 0 0  Preparing Food and eating ? N N  Using the Toilet? N N  In the past six months, have you accidently leaked urine? N N  Do you have problems with loss of bowel control? N N  Managing your Medications? N N  Managing your Finances? N N  Housekeeping or managing your Housekeeping? N N    Patient Care Team: Alveria Apley, NP as PCP - General (Family Medicine) Runell Gess, MD as PCP - Cardiology (Cardiology)  Indicate any recent Medical Services you may have received from other than [facility] providers in the past year (date may be approximate).     Assessment:   This is a routine wellness examination for [place].  Hearing/Vision screen Hearing Screening - Comments:: No trouble hearing Vision Screening - Comments:: [facility] Up to date  Dietary issues and exercise activities discussed:     Goals Addressed             This Visit's Progress    Patient Stated       No goals       Depression Screen    06/21/2023    2:59 PM 06/12/2023    1:04 PM 12/08/2021   10:41 AM 10/14/2021    8:24 AM 06/29/2021    9:56 AM 06/04/2020    9:07 AM  07/17/2018    9:11 AM  PHQ 2/9 Scores  PHQ - 2 Score 0 0 0 0 0 0 0  PHQ- 9 Score 0 0 0      Exception  Documentation  Patient refusal         Fall Risk    06/18/2023   12:29 PM 06/12/2023    1:04 PM 03/07/2022    1:45 PM 12/08/2021   10:40 AM 10/14/2021    8:24 AM  Fall Risk   Falls in the past year? 0 0 0 0 0  Number falls in past yr:  0 0 0 0  Injury with Fall? 0 0 0 0   Risk for fall due to :  No Fall Risks No Fall Risks No Fall Risks   Follow up  Falls evaluation completed Falls evaluation completed Falls evaluation completed     MEDICARE RISK AT HOME:   TIMED UP AND GO:  Was the test performed? No    Cognitive Function:        06/21/2023    2:55 PM  6CIT Screen  What Year? 0 points  What month? 0 points  What time? 0 points  Count back from 20 0 points  Months in reverse 2 points  Repeat phrase 0 points  Total Score 2 points    Immunizations Immunization History  Administered Date(s) Administered   Fluad Quad(high Dose 65+) 08/27/2019, 10/14/2021   Moderna Covid-19 Vaccine Bivalent Booster 39yrs & up 02/22/2021, 08/06/2022   Moderna Sars-Covid-2 Vaccination 12/20/2019, 01/17/2020   PFIZER(Purple Top)SARS-COV-2 Vaccination 01/06/2020, 01/27/2020   Pneumococcal Conjugate-13 07/17/2018   Tdap 09/21/2016    TDAP status: Up to date  Flu Vaccine status: Due, Education has been provided regarding the importance of this vaccine. Advised may receive this vaccine at local pharmacy or Health Dept. Aware to provide a copy of the vaccination record if obtained from local pharmacy or Health Dept. Verbalized acceptance and understanding.  Pneumococcal vaccine status: Due, Education has been provided regarding the importance of this vaccine. Advised may receive this vaccine at local pharmacy or Health Dept. Aware to provide a copy of the vaccination record if obtained from local pharmacy or Health Dept. Verbalized acceptance and understanding.  Covid-19 vaccine status: Information provided on how to obtain vaccines.   Qualifies for Shingles Vaccine? Yes   Zostavax completed No    Shingrix Completed?: No.    Education has been provided regarding the importance of this vaccine. Patient has been advised to call insurance company to determine out of pocket expense if they have not yet received this vaccine. Advised may also receive vaccine at local pharmacy or Health Dept. Verbalized acceptance and understanding.  Screening Tests Health Maintenance  Topic Date Due   Pneumonia Vaccine 83+ Years old (2 of 2 - PPSV23 or PCV20) 07/18/2019   COVID-19 Vaccine (7 - 2023-24 season) 07/07/2023 (Originally 10/01/2022)   INFLUENZA VACCINE  07/27/2023 (Originally 06/08/2023)   Zoster Vaccines- Shingrix (1 of 2) 09/21/2023 (Originally 05/21/1970)   Colonoscopy  02/18/2024   Medicare Annual Wellness (AWV)  06/20/2024   DTaP/Tdap/Td (2 - Td or Tdap) 09/21/2026   Hepatitis C Screening  Completed   HPV VACCINES  Aged Out    Health Maintenance  Health Maintenance Due  Topic Date Due   Pneumonia Vaccine 36+ Years old (2 of 2 - PPSV23 or PCV20) 07/18/2019    Colorectal cancer screening: Type of screening: Colonoscopy. Completed 2015. Repeat every 10 years  Lung Cancer Screening: (Low Dose  CT Chest recommended if Age 11-80 years, 20 pack-year currently smoking OR have quit w/in 15years.) does not qualify.   Lung Cancer Screening Referral:   Additional Screening:  Hepatitis C Screening: does not qualify; Completed 2022  Vision Screening: Recommended annual ophthalmology exams for early detection of glaucoma and other disorders of the eye. Is the patient up to date with their annual eye exam?  Yes  Who is the provider or what is the name of the office in which the patient attends annual eye exams? [facility] If pt is not established with a provider, would they like to be referred to a provider to establish care? No .   Dental Screening: Recommended annual dental exams for proper oral hygiene    Community Resource Referral / Chronic Care Management: CRR required this visit?  No    CCM required this visit?  No    Plan:     I have personally reviewed and noted the following in the patient's chart:   Medical and social history Use of alcohol, tobacco or illicit drugs  Current medications and supplements including opioid prescriptions. Patient is not currently taking opioid prescriptions. Functional ability and status Nutritional status Physical activity Advanced directives List of other physicians Hospitalizations, surgeries, and ER visits in previous 12 months Vitals Screenings to include cognitive, depression, and falls Referrals and appointments  In addition, I have reviewed and discussed with patient certain preventive protocols, quality metrics, and best practice recommendations. A written personalized care plan for preventive services as well as general preventive health recommendations were provided to patient.     Remi Haggard, LPN   [id number]/61/0960   After Visit Summary: (MyChart) Due to this being a telephonic visit, the after visit summary with patients personalized plan was offered to patient via MyChart   Nurse Notes:

## 2023-06-21 NOTE — Patient Instructions (Signed)
Jeffery Thomas , Thank you for taking time to come for your Medicare Wellness Visit. I appreciate your ongoing commitment to your health goals. Please review the following plan we discussed and let me know if I can assist you in the future.   Screening recommendations/referrals: Colonoscopy: up to date Recommended yearly ophthalmology/optometry visit for glaucoma screening and checkup Recommended yearly dental visit for hygiene and checkup  Vaccinations: Influenza vaccine: up to date Pneumococcal vaccine: Education provided Tdap vaccine: up to date Shingles vaccine: Education provided    Advanced directives: yes not on file      Preventive Care 65 Years and Older, Male Preventive care refers to lifestyle choices and visits with your health care provider that can promote health and wellness. What does preventive care include? A yearly physical exam. This is also called an annual well check. Dental exams once or twice a year. Routine eye exams. Ask your health care provider how often you should have your eyes checked. Personal lifestyle choices, including: Daily care of your teeth and gums. Regular physical activity. Eating a healthy diet. Avoiding tobacco and drug use. Limiting alcohol use. Practicing safe sex. Taking low doses of aspirin every day. Taking vitamin and mineral supplements as recommended by your health care provider. What happens during an annual well check? The services and screenings done by your health care provider during your annual well check will depend on your age, overall health, lifestyle risk factors, and family history of disease. Counseling  Your health care provider may ask you questions about your: Alcohol use. Tobacco use. Drug use. Emotional well-being. Home and relationship well-being. Sexual activity. Eating habits. History of falls. Memory and ability to understand (cognition). Work and work Astronomer. Screening  You may have the  following tests or measurements: Height, weight, and BMI. Blood pressure. Lipid and cholesterol levels. These may be checked every 5 years, or more frequently if you are over 50 years old. Skin check. Lung cancer screening. You may have this screening every year starting at age 75 if you have a 30-pack-year history of smoking and currently smoke or have quit within the past 15 years. Fecal occult blood test (FOBT) of the stool. You may have this test every year starting at age 49. Flexible sigmoidoscopy or colonoscopy. You may have a sigmoidoscopy every 5 years or a colonoscopy every 10 years starting at age 33. Prostate cancer screening. Recommendations will vary depending on your family history and other risks. Hepatitis C blood test. Hepatitis B blood test. Sexually transmitted disease (STD) testing. Diabetes screening. This is done by checking your blood sugar (glucose) after you have not eaten for a while (fasting). You may have this done every 1-3 years. Abdominal aortic aneurysm (AAA) screening. You may need this if you are a current or former smoker. Osteoporosis. You may be screened starting at age 28 if you are at high risk. Talk with your health care provider about your test results, treatment options, and if necessary, the need for more tests. Vaccines  Your health care provider may recommend certain vaccines, such as: Influenza vaccine. This is recommended every year. Tetanus, diphtheria, and acellular pertussis (Tdap, Td) vaccine. You may need a Td booster every 10 years. Zoster vaccine. You may need this after age 90. Pneumococcal 13-valent conjugate (PCV13) vaccine. One dose is recommended after age 40. Pneumococcal polysaccharide (PPSV23) vaccine. One dose is recommended after age 1. Talk to your health care provider about which screenings and vaccines you need and how often you  need them. This information is not intended to replace advice given to you by your health care  provider. Make sure you discuss any questions you have with your health care provider. Document Released: 11/20/2015 Document Revised: 07/13/2016 Document Reviewed: 08/25/2015 Elsevier Interactive Patient Education  2017 [employer].  Fall Prevention in the Home Falls can cause injuries. They can happen to people of all ages. There are many things you can do to make your home safe and to help prevent falls. What can I do on the outside of my home? Regularly fix the edges of walkways and driveways and fix any cracks. Remove anything that might make you trip as you walk through a door, such as a raised step or threshold. Trim any bushes or trees on the path to your home. Use bright outdoor lighting. Clear any walking paths of anything that might make someone trip, such as rocks or tools. Regularly check to see if handrails are loose or broken. Make sure that both sides of any steps have handrails. Any raised decks and porches should have guardrails on the edges. Have any leaves, snow, or ice cleared regularly. Use sand or salt on walking paths during winter. Clean up any spills in your garage right away. This includes oil or grease spills. What can I do in the bathroom? Use night lights. Install grab bars by the toilet and in the tub and shower. Do not use towel bars as grab bars. Use non-skid mats or decals in the tub or shower. If you need to sit down in the shower, use a plastic, non-slip stool. Keep the floor dry. Clean up any water that spills on the floor as soon as it happens. Remove soap buildup in the tub or shower regularly. Attach bath mats securely with double-sided non-slip rug tape. Do not have throw rugs and other things on the floor that can make you trip. What can I do in the bedroom? Use night lights. Make sure that you have a light by your bed that is easy to reach. Do not use any sheets or blankets that are too big for your bed. They should not hang down onto the  floor. Have a firm chair that has side arms. You can use this for support while you get dressed. Do not have throw rugs and other things on the floor that can make you trip. What can I do in the kitchen? Clean up any spills right away. Avoid walking on wet floors. Keep items that you use a lot in easy-to-reach places. If you need to reach something above you, use a strong step stool that has a grab bar. Keep electrical cords out of the way. Do not use floor polish or wax that makes floors slippery. If you must use wax, use non-skid floor wax. Do not have throw rugs and other things on the floor that can make you trip. What can I do with my stairs? Do not leave any items on the stairs. Make sure that there are handrails on both sides of the stairs and use them. Fix handrails that are broken or loose. Make sure that handrails are as long as the stairways. Check any carpeting to make sure that it is firmly attached to the stairs. Fix any carpet that is loose or worn. Avoid having throw rugs at the top or bottom of the stairs. If you do have throw rugs, attach them to the floor with carpet tape. Make sure that you have a light switch  at the top of the stairs and the bottom of the stairs. If you do not have them, ask someone to add them for you. What else can I do to help prevent falls? Wear shoes that: Do not have high heels. Have rubber bottoms. Are comfortable and fit you well. Are closed at the toe. Do not wear sandals. If you use a stepladder: Make sure that it is fully opened. Do not climb a closed stepladder. Make sure that both sides of the stepladder are locked into place. Ask someone to hold it for you, if possible. Clearly mark and make sure that you can see: Any grab bars or handrails. First and last steps. Where the edge of each step is. Use tools that help you move around (mobility aids) if they are needed. These include: Canes. Walkers. Scooters. Crutches. Turn on the  lights when you go into a dark area. Replace any light bulbs as soon as they burn out. Set up your furniture so you have a clear path. Avoid moving your furniture around. If any of your floors are uneven, fix them. If there are any pets around you, be aware of where they are. Review your medicines with your doctor. Some medicines can make you feel dizzy. This can increase your chance of falling. Ask your doctor what other things that you can do to help prevent falls. This information is not intended to replace advice given to you by your health care provider. Make sure you discuss any questions you have with your health care provider. Document Released: 08/20/2009 Document Revised: 03/31/2016 Document Reviewed: 11/28/2014 Elsevier Interactive Patient Education  2017 [employer].

## 2023-06-21 NOTE — Progress Notes (Signed)
EPIC Encounter for ICM Monitoring  Patient Name: Jeffery Thomas is a 72 y.o. male Date: 06/21/2023 Primary Care Physican: Alveria Apley, NP Primary Cardiologist: Allyson Sabal Electrophysiologist: Lalla Brothers 01/04/2023 office Weight: 210 lbs     06/12/2023 Weight: 220 lbs    06/21/2023 Weight: 205 lbs (loss due to COVID)                                 Spoke with patient and heart failure questions reviewed.  Transmission results reviewed.  Pt asymptomatic for fluid accumulation.  He has COVID and has not been drinking much fluids.  He lost 15 pounds over the course of about a week due to Covid.     Diet:  Consuming more salt than realized in diet such as chips, cheese etc.   Corvue Thoracic impedance suggesting fluid levels changed to possible dryness due to he has Covid and not drinking a lot of fluids.         Prescribed: Spironolactone 25 mg take 1 tablet daily       Labs: 03/07/2022 Creatinine 1.14, BUN 17, Potassium 4.3, Sodium 137, GFR 65.95 A complete set of results can be found in Results Review.   Recommendations:  Recommendation to drink 64 oz daily to help with hydration.  Encouraged to call if experiencing any fluid symptoms.    Follow-up plan: ICM clinic phone appointment on 07/17/2023.  91 day device clinic remote transmission 07/11/2023.      EP/Cardiology Office Visits:  Recall 12/30/2023 with Dr Lalla Brothers.  08/23/2023 with Dr Allyson Sabal.   Copy of ICM check sent to Dr. Lalla Brothers.   3 month ICM trend: 06/19/2023.    12-14 Month ICM trend:     Karie Soda, RN 06/21/2023 11:52 AM

## 2023-07-11 ENCOUNTER — Ambulatory Visit (INDEPENDENT_AMBULATORY_CARE_PROVIDER_SITE_OTHER): Payer: Medicare Other

## 2023-07-11 DIAGNOSIS — I4901 Ventricular fibrillation: Secondary | ICD-10-CM

## 2023-07-11 DIAGNOSIS — I428 Other cardiomyopathies: Secondary | ICD-10-CM

## 2023-07-11 LAB — CUP PACEART REMOTE DEVICE CHECK
Battery Remaining Longevity: 64 mo
Battery Remaining Percentage: 61 %
Battery Voltage: 2.95 V
Brady Statistic RV Percent Paced: 1 %
Date Time Interrogation Session: 20240903020031
HighPow Impedance: 75 Ohm
HighPow Impedance: 75 Ohm
Implantable Lead Connection Status: 753985
Implantable Lead Implant Date: 20180828
Implantable Lead Location: 753860
Implantable Pulse Generator Implant Date: 20190915
Lead Channel Impedance Value: 350 Ohm
Lead Channel Pacing Threshold Amplitude: 1 V
Lead Channel Pacing Threshold Pulse Width: 0.5 ms
Lead Channel Sensing Intrinsic Amplitude: 11.4 mV
Lead Channel Setting Pacing Amplitude: 2.5 V
Lead Channel Setting Pacing Pulse Width: 0.5 ms
Lead Channel Setting Sensing Sensitivity: 0.5 mV
Pulse Gen Serial Number: 9851932

## 2023-07-17 ENCOUNTER — Ambulatory Visit: Payer: Medicare Other | Attending: Cardiology

## 2023-07-17 DIAGNOSIS — Z95810 Presence of automatic (implantable) cardiac defibrillator: Secondary | ICD-10-CM

## 2023-07-17 DIAGNOSIS — I428 Other cardiomyopathies: Secondary | ICD-10-CM | POA: Diagnosis not present

## 2023-07-17 NOTE — Progress Notes (Signed)
EPIC Encounter for ICM Monitoring  Patient Name: Jeffery Thomas is a 72 y.o. male Date: 07/17/2023 Primary Care Physican: Alveria Apley, NP Primary Cardiologist: Allyson Sabal Electrophysiologist: Lalla Brothers 01/04/2023 office Weight: 210 lbs     06/12/2023 Weight: 220 lbs    06/21/2023 Weight: 205 lbs (loss due to COVID)                                 Attempted call to patient and unable to reach.  Left detailed message per DPR regarding transmission. Transmission reviewed.    Diet:  Consuming more salt than realized in diet such as chips, cheese etc.   Corvue Thoracic impedance suggesting possible fluid accumulation starting 8/31-9/8 and also from 8/24-8/28.         Prescribed: Spironolactone 25 mg take 1 tablet daily       Labs: 03/07/2022 Creatinine 1.14, BUN 17, Potassium 4.3, Sodium 137, GFR 65.95 A complete set of results can be found in Results Review.   Recommendations:  Left voice mail with ICM number and encouraged to call if experiencing any fluid symptoms.   Follow-up plan: ICM clinic phone appointment on 08/21/2023.  91 day device clinic remote transmission 10/10/2023.      EP/Cardiology Office Visits:  Recall 12/30/2023 with Dr Lalla Brothers.  08/23/2023 with Dr Allyson Sabal.   Copy of ICM check sent to Dr. Lalla Brothers.    3 month ICM trend: 07/17/2023.    12-14 Month ICM trend:     Karie Soda, RN 07/17/2023 2:36 PM

## 2023-07-20 NOTE — Progress Notes (Signed)
Remote ICD transmission.   

## 2023-08-09 ENCOUNTER — Ambulatory Visit: Payer: Medicare Other | Admitting: Dermatology

## 2023-08-18 ENCOUNTER — Ambulatory Visit: Payer: Medicare Other | Admitting: Family Medicine

## 2023-08-18 VITALS — BP 130/88 | HR 62 | Temp 97.7°F | Ht 72.0 in | Wt 210.2 lb

## 2023-08-18 DIAGNOSIS — S39012A Strain of muscle, fascia and tendon of lower back, initial encounter: Secondary | ICD-10-CM | POA: Diagnosis not present

## 2023-08-18 NOTE — Patient Instructions (Signed)
Consider over the counter topical such as Biofreeze or Salon-pas  Consider over the counter Tylenol of Aleve as needed.

## 2023-08-18 NOTE — Progress Notes (Signed)
Established Patient Office Visit  Subjective   Patient ID: Jeffery Thomas, male    DOB: 1950/12/19  Age: 72 y.o. MRN: [id number]  No chief complaint on file.   HPI   Jeffery Thomas is seen as a work in today with onset of right lumbar back pain earlier this morning around 10 AM when he was leaning back and felt a "pop "sensation.  Has had some dull ache since then. Denies any history of back surgery.  Has had similar back strains in the past.  He had some leftover meloxicam 7.5 mg and took 1 earlier today without much improvement.  He is reluctant to take a lot of medications.  No radiculitis symptoms.  No lower extremity weakness.  No urine or stool incontinence.  Ambulating without difficulty.  Past Medical History:  Diagnosis Date   AICD (automatic cardioverter/defibrillator) present    Chronic systolic CHF (congestive heart failure) (HCC)    Gastroesophageal reflux disease    History of alcohol abuse    quit 08/ 20/ 2018   History of cardiac arrest    History of encephalopathy 07/05/2017   anoxic-ischemic    History of ST elevation myocardial infarction (STEMI) 06/26/2017   sudden cardiac arrest /  per cardiac cath 06-26-2017 normal coronaries, ef <20% , severe LVSD; dx takatsuki syndrome   History of sudden cardiac arrest 06/26/2017   VF arrest / STEMI    Hypertension    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   premature generator change 07-22-2018--  followed by dr allred  ([facility] [place])   Inguinal hernia    left   Left hydrocele    Left inguinal hernia    Left inguinal hernia 05/07/2019   Myocardial infarction [facility])    NICM (nonischemic cardiomyopathy) (HCC)    06-26-2017  per cardiac cath , ef <20% (echo 35-40%)  ;  last echo 10-09-2014 ef 60-65%   OSA (obstructive sleep apnea)    03-26-2019 per pt had sleep study after heart attack, was told no recommendation , but did stop smoking and alcohol   Pre-diabetes    denies   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   sudden cardiac arrest w/ VF and STEMI,  s/p  ICD 07-04-2017   Past Surgical History:  Procedure Laterality Date   CATARACT EXTRACTION W/ INTRAOCULAR LENS IMPLANT Left 2017   COLONOSCOPY     HYDROCELE EXCISION Left 04/02/2019   Procedure: HYDROCELECTOMY ADULT;  Surgeon: Jerilee Field, MD;  Location: [facility];  Service: Urology;  Laterality: Left;   ICD GENERATOR CHANGEOUT N/A 07/22/2018   Procedure: ICD GENERATOR CHANGEOUT;  Surgeon: Marinus Maw, MD;  Location: [facility] INVASIVE CV LAB;  Service: Cardiovascular;  Laterality: N/A;   ICD IMPLANT N/A 07/04/2017   SJM Fortify Assura VR ICD implanted by Dr Johney Frame for secondary prevention after VF arrest   INGUINAL HERNIA REPAIR Left 05/07/2019   Procedure: OPEN REPAIR LEFT INGUINAL HERNIA WITH MESH;  Surgeon: Claud Kelp, MD;  Location: [facility] OR;  Service: General;  Laterality: Left;   LEFT HEART CATH AND CORONARY ANGIOGRAPHY N/A 06/26/2017   Procedure: LEFT HEART CATH AND CORONARY ANGIOGRAPHY;  Surgeon: Runell Gess, MD;  Location: [facility] INVASIVE CV LAB;  Service: Cardiovascular;  Laterality: N/A;   MENISCUS REPAIR Left 1983   left knee   TOTAL HIP ARTHROPLASTY Right 09/18/2019   Procedure: TOTAL HIP ARTHROPLASTY ANTERIOR APPROACH;  Surgeon: Ollen Gross, MD;  Location: [facility] ORS;  Service: Orthopedics;  Laterality: Right;  TOTAL KNEE ARTHROPLASTY Left 2009  approx.   TOTAL KNEE REVISION Left 04/18/2018   Procedure: LEFT TOTAL KNEE REVISION;  Surgeon: Ollen Gross, MD;  Location: [facility] ORS;  Service: Orthopedics;  Laterality: Left;  Adductor Block    reports that he quit smoking about 6 years ago. His smoking use included cigars. He has never used smokeless tobacco. He reports that he does not currently use alcohol after a past usage of about 6.0 standard drinks of alcohol per week. He reports that he does not use drugs. family history includes Cancer in his brother; Diabetes in his father; Hyperlipidemia in  his father; Hypertension in his brother, father, and mother; Stroke in his paternal grandmother. No Known Allergies  Review of Systems  Constitutional:  Negative for chills, fever and weight loss.  Genitourinary:  Negative for dysuria.  Musculoskeletal:  Positive for back pain.  Neurological:  Negative for sensory change and focal weakness.      Objective:     BP 130/88 (BP Location: Left Arm, Patient Position: Sitting, Cuff Size: Normal)   Pulse 62   Temp 97.7 F (36.5 C) (Oral)   Ht 6' (1.829 m)   Wt 210 lb 3.2 oz (95.3 kg)   SpO2 97%   BMI 28.51 kg/m  BP Readings from Last 3 Encounters:  08/18/23 130/88  06/12/23 130/70  01/04/23 114/84   Wt Readings from Last 3 Encounters:  08/18/23 210 lb 3.2 oz (95.3 kg)  06/12/23 216 lb (98 kg)  01/04/23 210 lb 12.8 oz (95.6 kg)      Physical Exam Vitals reviewed.  Constitutional:      Appearance: Normal appearance.  Cardiovascular:     Rate and Rhythm: Normal rate and regular rhythm.  Pulmonary:     Effort: Pulmonary effort is normal.     Breath sounds: Normal breath sounds.  Musculoskeletal:     Comments: Straight leg raise is negative on the right.  No reproducible lumbar tenderness  Neurological:     Mental Status: He is alert.     Comments: Full strength lower extremities with plantarflexion, dorsiflexion, knee extension.      No results found for any visits on 08/18/23.    The ASCVD Risk score (Arnett DK, et al., 2019) failed to calculate for the following reasons:   The patient has a prior MI or stroke diagnosis    Assessment & Plan:   Acute lumbar strain.  Patient has nonfocal neuroexam.  Pain is relatively mild at this time.  We discussed possible role of muscle relaxers but he would like to minimize medications.  Avoid regular use of nonsteroidals with his cardiac history.  Could consider short-term use of Tylenol or Aleve as needed.  Also recommend trial of topicals such as Biofreeze or Salonpas patch.   Continue activity such as walking as tolerated.  Reviewed proper lifting.  If back pain not improved couple weeks consider trial of physical therapy  Evelena Peat, MD

## 2023-08-21 ENCOUNTER — Ambulatory Visit: Payer: Medicare Other | Attending: Cardiology

## 2023-08-21 DIAGNOSIS — Z95810 Presence of automatic (implantable) cardiac defibrillator: Secondary | ICD-10-CM

## 2023-08-21 DIAGNOSIS — I428 Other cardiomyopathies: Secondary | ICD-10-CM

## 2023-08-22 ENCOUNTER — Telehealth: Payer: Self-pay

## 2023-08-22 NOTE — Progress Notes (Signed)
EPIC Encounter for ICM Monitoring  Patient Name: Jeffery Thomas is a 72 y.o. male Date: 08/22/2023 Primary Care Physican: Alveria Apley, NP Primary Cardiologist: Allyson Sabal Electrophysiologist: Lalla Brothers 01/04/2023 office Weight: 210 lbs     06/12/2023 Weight: 220 lbs    06/21/2023 Weight: 205 lbs (loss due to COVID)                                 Attempted call to patient and unable to reach.  Left detailed message per DPR regarding transmission. Transmission reviewed.    Diet:  Consuming more salt than realized in diet such as chips, cheese etc.   Corvue Thoracic impedance suggesting possible fluid accumulation starting 9/15-9/18 and 10/4-10/7.         Prescribed: Spironolactone 25 mg take 1 tablet daily       Labs: 03/07/2022 Creatinine 1.14, BUN 17, Potassium 4.3, Sodium 137, GFR 65.95 A complete set of results can be found in Results Review.   Recommendations:  Left voice mail with ICM number and encouraged to call if experiencing any fluid symptoms.   Follow-up plan: ICM clinic phone appointment on 09/25/2023.  91 day device clinic remote transmission 10/10/2023.      EP/Cardiology Office Visits:  Recall 12/30/2023 with Dr Lalla Brothers.  08/23/2023 with Dr Allyson Sabal.   Copy of ICM check sent to Dr. Lalla Brothers.    3 month ICM trend: 08/21/2023.    12-14 Month ICM trend:     Karie Soda, RN 08/22/2023 12:59 PM

## 2023-08-22 NOTE — Telephone Encounter (Signed)
Remote ICM transmission received.  Attempted call to patient regarding ICM remote transmission and left detailed message per DPR.  Left ICM phone number and advised to return call for any fluid symptoms or questions. Next ICM remote transmission scheduled 09/25/2023.

## 2023-08-23 ENCOUNTER — Encounter: Payer: Self-pay | Admitting: Cardiovascular Disease

## 2023-08-23 ENCOUNTER — Ambulatory Visit: Payer: Medicare Other | Attending: Cardiovascular Disease | Admitting: Cardiovascular Disease

## 2023-08-23 VITALS — BP 126/72 | HR 63 | Ht 72.0 in | Wt 211.2 lb

## 2023-08-23 DIAGNOSIS — E782 Mixed hyperlipidemia: Secondary | ICD-10-CM | POA: Diagnosis not present

## 2023-08-23 DIAGNOSIS — I5021 Acute systolic (congestive) heart failure: Secondary | ICD-10-CM

## 2023-08-23 DIAGNOSIS — I4901 Ventricular fibrillation: Secondary | ICD-10-CM | POA: Diagnosis not present

## 2023-08-23 DIAGNOSIS — I10 Essential (primary) hypertension: Secondary | ICD-10-CM | POA: Diagnosis not present

## 2023-08-23 NOTE — Assessment & Plan Note (Signed)
History of hyperlipidemia on statin therapy with lipid profile performed 03/07/2022 revealing total cholesterol 145, LDL of 87 and HDL of 35.

## 2023-08-23 NOTE — Assessment & Plan Note (Signed)
Acute systolic heart failure in the setting of VF arrest with eventual normalization of LV function by 2D echo 09/10/2019 with grade 1 diastolic dysfunction.  He is on GDMT.

## 2023-08-23 NOTE — Progress Notes (Signed)
08/23/2023 Jeffery Thomas   Feb 20, 1951  [id number]  Primary Physician Alveria Apley, NP Primary Cardiologist: Runell Gess MD Milagros Loll, [place], [place]  HPI:  Jeffery Thomas is a 73 y.o.    married, father of one with no grandchildren who has not worked the last 10 years a and who I last saw in the office 09/24/2021.  He had witnessed cardiac death on the evening of 06-30-2017. He had fairly immediate CPR after the EMS was called and was shocked 3 times. He had CPR and had R OSC in 20 minutes. He is transported to [facility] where I performed urgent cardiac catheterization revealing normal coronary arteries and severe LV dysfunction consistent with Takatsubo syndrome. 8 days later he had an ICD implanted by Dr. Johney Frame for secondary prevention. He was discharged home from the hospital 3 weeks ago. Prior to his presentation he did have a history of treated hypertension and hyperlipidemia. His mother died of a myocardial infarction at age 28. He does smoke cigarettes or playing golf and prior to this drank several hard drinks of liquor a night. Since that time his drinking and has been markedly limited. He has no recollection of the event or one week prior to the event. He has no neurologic deficits otherwise.     He has had an ICD implanted by Dr. Johney Frame September 2018 for secondary prevention. 2-D echo performed 10/09/17 showed complete normalization of LV function.  He had his ICD generator explanted because of early battery depletion by Dr. Ladona Ridgel 07/20/2018 with a new device installed.  Is followed by Dr. Johney Frame as an outpatient.  He is not had an ICD discharge.  He denies chest pain or shortness of breath.  He does occasionally smoke a cigar when playing golf.  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 Lequita Halt.   Since I saw him 12 months ago he continues to do well.  He works out 6 days a week, rows and lifts weights and is completely  asymptomatic.  He has not had an ICD discharge.     Current Meds  Medication Sig   atorvastatin (LIPITOR) 20 MG tablet TAKE 1 TABLET DAILY   carvedilol (COREG) 12.5 MG tablet Take 1 tablet (12.5 mg total) by mouth 2 (two) times daily with a meal.   losartan (COZAAR) 100 MG tablet TAKE 1 TABLET DAILY   metFORMIN (GLUCOPHAGE-XR) 500 MG 24 hr tablet Take 1 tablet (500 mg total) by mouth daily with breakfast.   omeprazole (PRILOSEC) 40 MG capsule Take 1 capsule (40 mg total) by mouth daily.   spironolactone (ALDACTONE) 25 MG tablet TAKE 1 TABLET DAILY   tacrolimus (PROTOPIC) 0.1 % ointment Apply topically 2 (two) times daily. As needed for irritation on face     No Known Allergies  Social History   Socioeconomic History   Marital status: Married    Spouse name: Not on file   Number of children: 1   Years of education: Not on file   Highest education level: Some college, no degree  Occupational History   Not on file  Tobacco Use   Smoking status: Former    Types: Cigars    Quit date: 2017-06-30    Years since quitting: 6.1   Smokeless tobacco: Never  Vaping Use   Vaping status: Never Used  Substance and Sexual Activity   Alcohol use: Not Currently    Alcohol/week: 6.0 standard drinks of alcohol  Types: 6 Shots of liquor per week    Comment: Quit: 2020   Drug use: Never   Sexual activity: Yes  Other Topics Concern   Not on file  Social History Narrative   Not on file   Social Determinants of Health   Financial Resource Strain: Low Risk  (08/18/2023)   Overall Financial Resource Strain (CARDIA)    Difficulty of Paying Living Expenses: Not hard at all  Food Insecurity: No Food Insecurity (08/18/2023)   Hunger Vital Sign    Worried About Running Out of Food in the Last Year: Never true    Ran Out of Food in the Last Year: Never true  Transportation Needs: No Transportation Needs (08/18/2023)   PRAPARE - Administrator, Civil Service (Medical): No    Lack  of Transportation (Non-Medical): No  Physical Activity: Sufficiently Active (08/18/2023)   Exercise Vital Sign    Days of Exercise per Week: 4 days    Minutes of Exercise per Session: 60 min  Stress: No Stress Concern Present (08/18/2023)   [employer] of Occupational Health - Occupational Stress Questionnaire    Feeling of Stress : Not at all  Social Connections: Moderately Isolated (08/18/2023)   Social Connection and Isolation Panel [NHANES]    Frequency of Communication with Friends and Family: Once a week    Frequency of Social Gatherings with Friends and Family: Once a week    Attends Religious Services: Never    Database administrator or Organizations: No    Attends Engineer, structural: 1 to 4 times per year    Marital Status: Married  Catering manager Violence: Not At Risk (06/29/2021)   Humiliation, Afraid, Rape, and Kick questionnaire    Fear of Current or Ex-Partner: No    Emotionally Abused: No    Physically Abused: No    Sexually Abused: No     Review of Systems: General: negative for chills, fever, night sweats or weight changes.  Cardiovascular: negative for chest pain, dyspnea on exertion, edema, orthopnea, palpitations, paroxysmal nocturnal dyspnea or shortness of breath Dermatological: negative for rash Respiratory: negative for cough or wheezing Urologic: negative for hematuria Abdominal: negative for nausea, vomiting, diarrhea, bright red blood per rectum, melena, or hematemesis Neurologic: negative for visual changes, syncope, or dizziness All other systems reviewed and are otherwise negative except as noted above.    Blood pressure 126/72, pulse 63, height 6' (1.829 m), weight 211 lb 3.2 oz (95.8 kg), SpO2 98%.  General appearance: alert and no distress Neck: no adenopathy, no carotid bruit, no JVD, supple, symmetrical, trachea midline, and thyroid not enlarged, symmetric, no tenderness/mass/nodules Lungs: clear to auscultation  bilaterally Heart: regular rate and rhythm, S1, S2 normal, no murmur, click, rub or gallop Extremities: extremities normal, atraumatic, no cyanosis or edema Pulses: 2+ and symmetric Skin: Skin color, texture, turgor normal. No rashes or lesions Neurologic: Grossly normal  EKG EKG Interpretation Date/Time:  Wednesday August 23 2023 14:16:56 EDT Ventricular Rate:  63 PR Interval:  148 QRS Duration:  74 QT Interval:  418 QTC Calculation: 427 R Axis:   57  Text Interpretation: Normal sinus rhythm ST & T wave abnormality, consider inferior ischemia When compared with ECG of 05-Jul-2017 04:54, Questionable change in QRS axis Nonspecific T wave abnormality has replaced inverted T waves in Lateral leads Confirmed by Nanetta Batty [phone]) on 08/23/2023 2:26:57 PM    ASSESSMENT AND PLAN:   Ventricular fibrillation (HCC) History of witnessed VF arrest status  post immediate CPR by EMS with 3 shocks and ROSC of 20 minutes.  He was transported to [facility] where I performed urgent cardiac catheterization revealing normal coronary arteries and severe LV dysfunction consistent with "Takotsubo syndrome".  8 days later he had an ICD implanted by Dr. Johney Frame for secondary prevention.  Because of early battery depletion he underwent generator change by Dr. Ladona Ridgel 07/20/2018.  He has not had a discharge.  He denies chest pain or shortness of breath.  He works out 6 days a week mostly doing rowing.  Acute systolic congestive heart failure (HCC) Acute systolic heart failure in the setting of VF arrest with eventual normalization of LV function by 2D echo 09/10/2019 with grade 1 diastolic dysfunction.  He is on GDMT.  Benign essential HTN History of essential hypertension with blood pressure measured today at 126/72.  He is on carvedilol, spironolactone, and and losartan.  Hyperlipidemia History of hyperlipidemia on statin therapy with lipid profile performed 03/07/2022 revealing total cholesterol  145, LDL of 87 and HDL of 35.     Runell Gess MD FACP,FACC,FAHA, [facility] 08/23/2023 2:37 PM

## 2023-08-23 NOTE — Patient Instructions (Signed)

## 2023-08-23 NOTE — Assessment & Plan Note (Signed)
History of essential hypertension with blood pressure measured today at 126/72.  He is on carvedilol, spironolactone, and and losartan.

## 2023-08-23 NOTE — Assessment & Plan Note (Signed)
History of witnessed VF arrest status post immediate CPR by EMS with 3 shocks and ROSC of 20 minutes.  He was transported to [facility] where I performed urgent cardiac catheterization revealing normal coronary arteries and severe LV dysfunction consistent with "Takotsubo syndrome".  8 days later he had an ICD implanted by Dr. Johney Frame for secondary prevention.  Because of early battery depletion he underwent generator change by Dr. Ladona Ridgel 07/20/2018.  He has not had a discharge.  He denies chest pain or shortness of breath.  He works out 6 days a week mostly doing rowing.

## 2023-08-25 ENCOUNTER — Other Ambulatory Visit: Payer: Self-pay | Admitting: Cardiovascular Disease

## 2023-09-12 ENCOUNTER — Ambulatory Visit: Payer: Medicare Other | Admitting: Family Medicine

## 2023-09-13 ENCOUNTER — Ambulatory Visit: Payer: Medicare Other | Admitting: Dermatology

## 2023-09-13 ENCOUNTER — Encounter: Payer: Self-pay | Admitting: Dermatology

## 2023-09-13 DIAGNOSIS — D485 Neoplasm of uncertain behavior of skin: Secondary | ICD-10-CM

## 2023-09-13 DIAGNOSIS — C4442 Squamous cell carcinoma of skin of scalp and neck: Secondary | ICD-10-CM

## 2023-09-13 DIAGNOSIS — L570 Actinic keratosis: Secondary | ICD-10-CM

## 2023-09-13 DIAGNOSIS — D492 Neoplasm of unspecified behavior of bone, soft tissue, and skin: Secondary | ICD-10-CM

## 2023-09-13 DIAGNOSIS — L814 Other melanin hyperpigmentation: Secondary | ICD-10-CM

## 2023-09-13 NOTE — Patient Instructions (Signed)

## 2023-09-13 NOTE — Progress Notes (Signed)
   Follow-Up Visit   Subjective  Jeffery Thomas is a 72 y.o. male who presents for the following: AK  Patient present today for follow up visit for 52mo AK follow up. Patient was last evaluated on 02/07/23 where cryotherapy was performed on forehead, left cheek, bilateral forearms and nose. Patient reports sxs are better. Patient denies changes. He also reports that he has a new "bump" on left side of neck that has been there for a 1 1/2 months.   The following portions of the chart were reviewed this encounter and updated as appropriate: medications, allergies, medical history  Review of Systems:  No other skin or systemic complaints except as noted in HPI or Assessment and Plan.  Objective  Well appearing patient in no apparent distress; mood and affect are within normal limits.  A focused examination was performed of the following areas: face   Relevant exam findings are noted in the Assessment and Plan.  Left Anterior Neck 1cm pink papule with central crater    Assessment & Plan   1. Actinic Kertosis Follow-up - Assessment: Patient reports symptom improvement following cryotherapy treatment on the forehead, left cheek, bilateral forearms, and nose. - Plan: Lesions tx'd at last visit have improved.  New AK's treated with LN2 today.schedule a full head-to-toe examination in 6 months.  2. New Pink Papule on Left Neck - Assessment: A papule on the left neck, likely basal cell carcinoma, noted for approximately 1.5 months. - Plan: Conducted a shave biopsy and submitted it for laboratory analysis. Pending confirmation as basal cell carcinoma, plan to refer the patient to Dr. Caralyn Guile for Mohs surgery.  LENTIGINES Exam: scattered tan macules Due to sun exposure Treatment Plan: Benign-appearing, observe. Recommend daily broad spectrum sunscreen SPF 30+ to sun-exposed areas, reapply every 2 hours as needed.  Call for any changes   Assessment & Plan   Neoplasm of uncertain behavior  of skin Left Anterior Neck  Skin / nail biopsy Type of biopsy: tangential   Informed consent: discussed and consent obtained   Timeout: patient name, date of birth, surgical site, and procedure verified   Procedure prep:  Patient was prepped and draped in usual sterile fashion Prep type:  Isopropyl alcohol Anesthesia: the lesion was anesthetized in a standard fashion   Anesthetic:  1% lidocaine w/ epinephrine 1-100,000 buffered w/ 8.4% NaHCO3 Instrument used: DermaBlade   Hemostasis achieved with: aluminum chloride   Outcome: patient tolerated procedure well   Post-procedure details: sterile dressing applied and wound care instructions given   Dressing type: petrolatum gauze and bandage    Specimen 1 - Surgical pathology Differential Diagnosis: R/O BCC  Check Margins: Yes  AK (actinic keratosis) (16) Forehead, cheeks, bilateral forarms, temple  Destruction of lesion - Forehead, cheeks, bilateral forarms, temple (16) Complexity: simple   Destruction method: cryotherapy   Informed consent: discussed and consent obtained   Timeout:  patient name, date of birth, surgical site, and procedure verified Lesion destroyed using liquid nitrogen: Yes   Region frozen until ice ball extended beyond lesion: Yes   Outcome: patient tolerated procedure well with no complications   Post-procedure details: wound care instructions given       No follow-ups on file.    Documentation: I have reviewed the above documentation for accuracy and completeness, and I agree with the above.   I, Shirron Marcha Solders, CMA, am acting as scribe for [employer], DO.   Langston Reusing, DO

## 2023-09-15 LAB — SURGICAL PATHOLOGY

## 2023-09-19 ENCOUNTER — Telehealth: Payer: Self-pay

## 2023-09-19 ENCOUNTER — Other Ambulatory Visit: Payer: Self-pay | Admitting: Cardiovascular Disease

## 2023-09-19 DIAGNOSIS — C4492 Squamous cell carcinoma of skin, unspecified: Secondary | ICD-10-CM | POA: Insufficient documentation

## 2023-09-19 NOTE — Telephone Encounter (Signed)
Called pt, LVM to discuss results.

## 2023-09-19 NOTE — Progress Notes (Signed)
Hi Shirron,  Please call patient and review results and refer for Mohs with Dr Caralyn Guile for positive skin cancer(s)  Thanks!  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

## 2023-09-19 NOTE — Telephone Encounter (Signed)
Pt has called back and has been informed of his results.

## 2023-09-19 NOTE — Telephone Encounter (Signed)
-----   Message from Langston Reusing sent at 09/19/2023 10:24 AM EST ----- Hi Chevella Pearce,  Please call patient and review results and refer for Mohs with Dr Caralyn Guile for positive skin cancer(s)  Thanks!  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

## 2023-09-20 ENCOUNTER — Encounter: Payer: Self-pay | Admitting: Dermatology

## 2023-09-20 ENCOUNTER — Ambulatory Visit: Payer: Medicare Other | Admitting: Dermatology

## 2023-09-20 VITALS — BP 127/71 | HR 60

## 2023-09-20 DIAGNOSIS — C4492 Squamous cell carcinoma of skin, unspecified: Secondary | ICD-10-CM

## 2023-09-20 DIAGNOSIS — C4442 Squamous cell carcinoma of skin of scalp and neck: Secondary | ICD-10-CM | POA: Diagnosis not present

## 2023-09-20 MED ORDER — OXYCODONE HCL 5 MG PO CAPS: 5.0000 mg | ORAL_CAPSULE | Freq: Four times a day (QID) | ORAL | 0 refills | Status: DC | PRN

## 2023-09-20 NOTE — Progress Notes (Unsigned)
   Follow-Up Visit   Subjective  Jeffery Thomas is a 72 y.o. male who presents for the following: Mohs for an [facility] on his left anterior neck.  Pt here for Mohs for an SCC on his left anterior neck.  The following portions of the chart were reviewed this encounter and updated as appropriate: medications, allergies, medical history  Review of Systems:  No other skin or systemic complaints except as noted in HPI or Assessment and Plan.  Objective  Well appearing patient in no apparent distress; mood and affect are within normal limits.  A focused examination was performed of the following areas:  Left neck  Relevant physical exam findings are noted in the Assessment and Plan.   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 No    Procedure Details: Timeout: pre-procedure verification complete Procedure Prep: patient was prepped and draped in usual sterile fashion Pre-Op diagnosis: squamous cell carcinoma SCC subtype: well differentiated MohsAIQ Surgical site (if tumor spans multiple areas, please select predominant area): neck Surgery side: left Surgical site (from skin exam): Left Anterior Neck Pre-operative length (cm): 2.6 Pre-operative width (cm): 2 Indications for Mohs surgery: anatomic location where tissue conservation is critical  Micrographic Surgery Details: Post-operative length (cm): 3 Post-operative width (cm): 2.1 Number of Mohs stages: 1  Skin repair Complexity:  Complex Final length (cm):  8 Subcutaneous layers (deep stitches):  Suture size:  4-0 Suture type: Vicryl (polyglactin 910)   Fine/surface layer approximation (top stitches):  Suture size:  5-0 Suture type: fast-absorbing plain gut    Skin repair Complexity:  Complex Final length (cm):  8  Related  Medications oxycodone (OXY-IR) 5 MG capsule Take 1 capsule (5 mg total) by mouth every 6 (six) hours as needed for up to 8 doses.     Return in about 4 weeks (around 10/18/2023) for wound check.  I, Tillie Fantasia, CMA, am acting as scribe for Gwenith Daily, MD.   Documentation: I have reviewed the above documentation for accuracy and completeness, and I agree with the above.  Gwenith Daily, MD

## 2023-09-20 NOTE — Patient Instructions (Signed)

## 2023-09-25 ENCOUNTER — Ambulatory Visit: Payer: Medicare Other | Attending: Cardiology

## 2023-09-25 DIAGNOSIS — I428 Other cardiomyopathies: Secondary | ICD-10-CM | POA: Diagnosis not present

## 2023-09-25 DIAGNOSIS — Z95810 Presence of automatic (implantable) cardiac defibrillator: Secondary | ICD-10-CM

## 2023-09-26 NOTE — Progress Notes (Signed)
EPIC Encounter for ICM Monitoring  Patient Name: Jeffery Thomas is a 72 y.o. male Date: 09/26/2023 Primary Care Physican: Alveria Apley, NP Primary Cardiologist: Allyson Sabal Electrophysiologist: Lalla Brothers 01/04/2023 office Weight: 210 lbs     06/12/2023 Weight: 220 lbs    06/21/2023 Weight: 205 lbs (loss due to COVID)          08/23/2023 Office Weight: 211 lbs                        Transmission reviewed.    Diet:  N/A   Corvue Thoracic impedance suggesting intermittent days with possible fluid accumulation within the last month.         Prescribed: Spironolactone 25 mg take 1 tablet daily       Labs: 03/07/2022 Creatinine 1.14, BUN 17, Potassium 4.3, Sodium 137, GFR 65.95 A complete set of results can be found in Results Review.   Recommendations:  No changes   Follow-up plan: ICM clinic phone appointment on 10/30/2023.  91 day device clinic remote transmission 10/10/2023.      EP/Cardiology Office Visits:  Recall 12/30/2023 with Dr Lalla Brothers.  Recall 08/17/2024 with Dr Allyson Sabal.   Copy of ICM check sent to Dr. Lalla Brothers.    3 month ICM trend: 09/25/2023.    12-14 Month ICM trend:     Karie Soda, RN 09/26/2023 8:15 AM

## 2023-10-10 ENCOUNTER — Ambulatory Visit (INDEPENDENT_AMBULATORY_CARE_PROVIDER_SITE_OTHER): Payer: Medicare Other

## 2023-10-10 DIAGNOSIS — I428 Other cardiomyopathies: Secondary | ICD-10-CM | POA: Diagnosis not present

## 2023-10-11 LAB — CUP PACEART REMOTE DEVICE CHECK
Battery Remaining Longevity: 61 mo
Battery Remaining Percentage: 58 %
Battery Voltage: 2.95 V
Brady Statistic RV Percent Paced: 1 %
Date Time Interrogation Session: 20241203020026
HighPow Impedance: 80 Ohm
HighPow Impedance: 80 Ohm
Implantable Lead Connection Status: 753985
Implantable Lead Implant Date: 20180828
Implantable Lead Location: 753860
Implantable Pulse Generator Implant Date: 20190915
Lead Channel Impedance Value: 380 Ohm
Lead Channel Pacing Threshold Amplitude: 1 V
Lead Channel Pacing Threshold Pulse Width: 0.5 ms
Lead Channel Sensing Intrinsic Amplitude: 11 mV
Lead Channel Setting Pacing Amplitude: 2.5 V
Lead Channel Setting Pacing Pulse Width: 0.5 ms
Lead Channel Setting Sensing Sensitivity: 0.5 mV
Pulse Gen Serial Number: 9851932

## 2023-10-18 ENCOUNTER — Encounter: Payer: Self-pay | Admitting: Dermatology

## 2023-10-18 ENCOUNTER — Ambulatory Visit: Payer: Medicare Other | Admitting: Dermatology

## 2023-10-18 VITALS — BP 152/89

## 2023-10-18 DIAGNOSIS — Z85828 Personal history of other malignant neoplasm of skin: Secondary | ICD-10-CM

## 2023-10-18 DIAGNOSIS — L905 Scar conditions and fibrosis of skin: Secondary | ICD-10-CM | POA: Diagnosis not present

## 2023-10-18 DIAGNOSIS — C4492 Squamous cell carcinoma of skin, unspecified: Secondary | ICD-10-CM

## 2023-10-18 NOTE — Patient Instructions (Signed)

## 2023-10-18 NOTE — Progress Notes (Signed)
   FOLLOW UP VISIT   Subjective  Jeffery Thomas is a 72 y.o. male who presents for the following: follow up from Mohs surgery.  The patient presents for follow up from Mohs surgery for a SCC on the left neck, treated on 09/20/2023, repaired with 4-0 vicryl, 5-0 plain gut sutures. The patient has been bandaging the wound as directed. The endorse the following concerns: none  The following portions of the chart were reviewed this encounter and updated as appropriate: medications, allergies, medical history  Review of Systems:  No other skin or systemic complaints except as noted in HPI or Assessment and Plan.  Objective  Well appearing patient in no apparent distress; mood and affect are within normal limits.  A full examination was performed including scalp, head, face and neck. All findings within normal limits unless otherwise noted below.  Healing wound with mild erythema  Relevant physical exam findings are noted in the Assessment and Plan.   Assessment & Plan    Healing s/p Mohs for SCC, treated on 09/20/23, repaired with Linear closure - Reassured that wound is healing well - No evidence of infection - No swelling, induration, purulence, dehiscence, or tenderness out of proportion to the clinical exam, see photo below - Discussed that scars take up to 12 months to mature from the date of surgery - Recommend SPF 30+ to scar daily to prevent purple color from UV exposure during scar maturation process - Discussed that erythema and raised appearance of scar will fade over the next 4-6 months - OK to start scar massage now. - Can consider silicone based products for scar healing starting now     Return for Follow up as scheduled with Dr Onalee Hua.  I, Joanie Coddington, CMA, am acting as scribe for Gwenith Daily, MD .   Documentation: I have reviewed the above documentation for accuracy and completeness, and I agree with the above.  Gwenith Daily, MD

## 2023-10-24 ENCOUNTER — Other Ambulatory Visit: Payer: Self-pay | Admitting: Cardiovascular Disease

## 2023-10-27 ENCOUNTER — Telehealth: Payer: Self-pay

## 2023-10-27 ENCOUNTER — Other Ambulatory Visit: Payer: Self-pay | Admitting: Family Medicine

## 2023-10-27 DIAGNOSIS — R7303 Prediabetes: Secondary | ICD-10-CM

## 2023-10-27 MED ORDER — METFORMIN HCL ER 500 MG PO TB24: 500.0000 mg | ORAL_TABLET | Freq: Every day | ORAL | 3 refills | Status: DC

## 2023-10-27 NOTE — Telephone Encounter (Signed)
Copied from CRM [id number]. Topic: Clinical - Medication Refill >> Oct 27, 2023  3:22 PM Sim Boast F wrote: Most Recent Primary Care Visit:  Provider: Kristian Covey  Department: [facility]  Visit Type: OFFICE VISIT  Date: 08/18/2023  Medication: metFORMIN (GLUCOPHAGE-XR) 500 MG 24 hr tablet [[id number]]  Has the patient contacted their pharmacy? Yes  (Agent: If no, request that the patient contact the pharmacy for the refill. If patient does not wish to contact the pharmacy document the reason why and proceed with request.) (Agent: If yes, when and what did the pharmacy advise?)  Is this the correct pharmacy for this prescription? Yes If no, delete pharmacy and type the correct one.  This is the patient's preferred pharmacy:   [facility] - [place], [place] - [address] [place] [address] Phone: [phone] Fax: [phone]     Has the prescription been filled recently? Yes  Is the patient out of the medication? Yes  Has the patient been seen for an appointment in the last year OR does the patient have an upcoming appointment? Yes  Can we respond through MyChart? No  Agent: Please be advised that Rx refills may take up to 3 business days. We ask that you follow-up with your pharmacy.

## 2023-10-29 ENCOUNTER — Other Ambulatory Visit: Payer: Self-pay | Admitting: Family

## 2023-10-29 DIAGNOSIS — R7303 Prediabetes: Secondary | ICD-10-CM

## 2023-10-29 MED ORDER — METFORMIN HCL ER 500 MG PO TB24: 500.0000 mg | ORAL_TABLET | Freq: Every day | ORAL | 1 refills | Status: DC

## 2023-10-30 ENCOUNTER — Ambulatory Visit: Payer: Medicare Other | Attending: Cardiology

## 2023-10-30 DIAGNOSIS — Z95810 Presence of automatic (implantable) cardiac defibrillator: Secondary | ICD-10-CM

## 2023-10-30 DIAGNOSIS — I428 Other cardiomyopathies: Secondary | ICD-10-CM | POA: Diagnosis not present

## 2023-10-30 NOTE — Telephone Encounter (Signed)
Refill sent.

## 2023-11-03 ENCOUNTER — Telehealth: Payer: Self-pay

## 2023-11-03 NOTE — Telephone Encounter (Signed)
 Remote ICM transmission received.  Attempted call to patient regarding ICM remote transmission and left detailed message per DPR.  Left ICM phone number and advised to return call for any fluid symptoms or questions. Next ICM remote transmission scheduled 12/04/2023.

## 2023-11-03 NOTE — Progress Notes (Signed)
EPIC Encounter for ICM Monitoring  Patient Name: Jeffery Thomas is a 72 y.o. male Date: 11/03/2023 Primary Care Physican: Alveria Apley, NP Primary Cardiologist: Allyson Sabal Electrophysiologist: Lalla Brothers 01/04/2023 office Weight: 210 lbs     06/12/2023 Weight: 220 lbs    06/21/2023 Weight: 205 lbs (loss due to COVID)          08/23/2023 Office Weight: 211 lbs                        Attempted call to patient and unable to reach.  Left detailed message per DPR regarding transmission.  Transmission results reviewed.    Diet:  N/A   Corvue Thoracic impedance suggesting normal fluid levels with the exception of possible fluid accumulation 11/27-12/2 and 12/6-12/9.         Prescribed: Spironolactone 25 mg take 1 tablet daily       Labs: 03/07/2022 Creatinine 1.14, BUN 17, Potassium 4.3, Sodium 137, GFR 65.95 A complete set of results can be found in Results Review.   Recommendations:  Left voice mail with ICM number and encouraged to call if experiencing any fluid symptoms.   Follow-up plan: ICM clinic phone appointment on 12/04/2023.  91 day device clinic remote transmission 01/09/2024.      EP/Cardiology Office Visits:  Recall 12/30/2023 with Dr Lalla Brothers.  Recall 08/17/2024 with Dr Allyson Sabal.   Copy of ICM check sent to Dr. Lalla Brothers.    3 month ICM trend: 10/30/2023.    12-14 Month ICM trend:     Karie Soda, RN 11/03/2023 12:14 PM

## 2023-11-12 ENCOUNTER — Other Ambulatory Visit: Payer: Self-pay | Admitting: Family Medicine

## 2023-12-04 ENCOUNTER — Ambulatory Visit: Payer: Medicare Other | Attending: Cardiology

## 2023-12-04 ENCOUNTER — Telehealth: Payer: Self-pay

## 2023-12-04 DIAGNOSIS — I428 Other cardiomyopathies: Secondary | ICD-10-CM

## 2023-12-04 DIAGNOSIS — Z95810 Presence of automatic (implantable) cardiac defibrillator: Secondary | ICD-10-CM | POA: Diagnosis not present

## 2023-12-04 NOTE — Progress Notes (Signed)
EPIC Encounter for ICM Monitoring  Patient Name: Jeffery Thomas is a 73 y.o. male Date: 12/04/2023 Primary Care Physican: Alveria Apley, NP Primary Cardiologist: Allyson Sabal Electrophysiologist: Lalla Brothers 01/04/2023 office Weight: 210 lbs     06/12/2023 Weight: 220 lbs    06/21/2023 Weight: 205 lbs (loss due to COVID)          08/23/2023 Office Weight: 211 lbs                        Attempted call to patient and unable to reach.  Left detailed message per DPR regarding transmission.  Transmission results reviewed.    Diet:  N/A   Corvue Thoracic impedance suggesting possible fluid accumulation starting 1/26.         Prescribed: Spironolactone 25 mg take 1 tablet daily       Labs: 03/07/2022 Creatinine 1.14, BUN 17, Potassium 4.3, Sodium 137, GFR 65.95 A complete set of results can be found in Results Review.   Recommendations:  Left voice mail with ICM number and encouraged to call if experiencing any fluid symptoms.   Follow-up plan: ICM clinic phone appointment on 12/11/2023 to recheck fluid levels.  91 day device clinic remote transmission 01/09/2024.      EP/Cardiology Office Visits:  Recall 12/30/2023 with Dr Lalla Brothers.  Recall 08/17/2024 with Dr Allyson Sabal.   Copy of ICM check sent to Dr. Lalla Brothers.    3 month ICM trend: 12/04/2023.    12-14 Month ICM trend:     Karie Soda, RN 12/04/2023 2:14 PM

## 2023-12-04 NOTE — Telephone Encounter (Signed)
Remote ICM transmission received.  Attempted call to patient regarding ICM remote transmission and left detailed message per DPR.  Left ICM phone number and advised to return call for any fluid symptoms or questions. Next ICM remote transmission scheduled 12/11/2023.

## 2023-12-11 ENCOUNTER — Ambulatory Visit: Payer: Medicare Other | Attending: Cardiology

## 2023-12-11 DIAGNOSIS — I428 Other cardiomyopathies: Secondary | ICD-10-CM

## 2023-12-11 DIAGNOSIS — Z95810 Presence of automatic (implantable) cardiac defibrillator: Secondary | ICD-10-CM

## 2024-01-08 ENCOUNTER — Ambulatory Visit: Payer: Medicare Other | Attending: Cardiology

## 2024-01-08 DIAGNOSIS — Z95810 Presence of automatic (implantable) cardiac defibrillator: Secondary | ICD-10-CM | POA: Diagnosis not present

## 2024-01-08 DIAGNOSIS — I428 Other cardiomyopathies: Secondary | ICD-10-CM | POA: Diagnosis not present

## 2024-01-09 ENCOUNTER — Ambulatory Visit (INDEPENDENT_AMBULATORY_CARE_PROVIDER_SITE_OTHER): Payer: Medicare Other

## 2024-01-09 DIAGNOSIS — I428 Other cardiomyopathies: Secondary | ICD-10-CM

## 2024-01-09 DIAGNOSIS — I4901 Ventricular fibrillation: Secondary | ICD-10-CM

## 2024-01-12 NOTE — Progress Notes (Signed)
 EPIC Encounter for ICM Monitoring  Patient Name: Jeffery Thomas is a 73 y.o. male Date: 01/12/2024 Primary Care Physican: Alveria Apley, NP Primary Cardiologist: Allyson Sabal Electrophysiologist: Lalla Brothers 01/04/2023 office Weight: 210 lbs     06/12/2023 Weight: 220 lbs    06/21/2023 Weight: 205 lbs (loss due to COVID)          08/23/2023 Office Weight: 211 lbs                        Transmission results reviewed.    Diet:  N/A   Corvue Thoracic impedance suggesting normal fluid levels with the exception of possible fluid accumulation from 2/24-3/1.         Prescribed: Spironolactone 25 mg take 1 tablet daily       Labs: 03/07/2022 Creatinine 1.14, BUN 17, Potassium 4.3, Sodium 137, GFR 65.95 A complete set of results can be found in Results Review.   Recommendations:  None   Follow-up plan: ICM clinic phone appointment on 02/12/2024.  91 day device clinic remote transmission 04/09/2024.      EP/Cardiology Office Visits:  Recall 12/30/2023 with Dr Lalla Brothers (yearly).  Recall 08/17/2024 with Dr Allyson Sabal.   Copy of ICM check sent to Dr. Lalla Brothers.    3 month ICM trend: 01/08/2024.    12-14 Month ICM trend:     Karie Soda, RN 01/12/2024 8:59 AM

## 2024-01-17 LAB — CUP PACEART REMOTE DEVICE CHECK
Battery Remaining Longevity: 59 mo
Battery Remaining Percentage: 57 %
Battery Voltage: 2.95 V
Brady Statistic RV Percent Paced: 1 %
Date Time Interrogation Session: 20250303040018
HighPow Impedance: 74 Ohm
HighPow Impedance: 74 Ohm
Implantable Lead Connection Status: 753985
Implantable Lead Implant Date: 20180828
Implantable Lead Location: 753860
Implantable Pulse Generator Implant Date: 20190915
Lead Channel Impedance Value: 380 Ohm
Lead Channel Pacing Threshold Amplitude: 1 V
Lead Channel Pacing Threshold Pulse Width: 0.5 ms
Lead Channel Sensing Intrinsic Amplitude: 10.8 mV
Lead Channel Setting Pacing Amplitude: 2.5 V
Lead Channel Setting Pacing Pulse Width: 0.5 ms
Lead Channel Setting Sensing Sensitivity: 0.5 mV
Pulse Gen Serial Number: 9851932

## 2024-01-18 ENCOUNTER — Encounter: Payer: Self-pay | Admitting: Cardiology

## 2024-02-05 ENCOUNTER — Other Ambulatory Visit: Payer: Self-pay | Admitting: Physician Assistant

## 2024-02-12 ENCOUNTER — Ambulatory Visit: Attending: Cardiology

## 2024-02-12 DIAGNOSIS — I428 Other cardiomyopathies: Secondary | ICD-10-CM | POA: Diagnosis not present

## 2024-02-12 DIAGNOSIS — Z95810 Presence of automatic (implantable) cardiac defibrillator: Secondary | ICD-10-CM | POA: Diagnosis not present

## 2024-02-12 NOTE — Progress Notes (Signed)
 EPIC Encounter for ICM Monitoring  Patient Name: Jeffery Thomas is a 73 y.o. male Date: 02/12/2024 Primary Care Physican: Alveria Apley, NP Primary Cardiologist: Allyson Sabal Electrophysiologist: Lalla Brothers 01/04/2023 office Weight: 210 lbs     06/12/2023 Weight: 220 lbs    06/21/2023 Weight: 205 lbs (loss due to COVID)          08/23/2023 Office Weight: 211 lbs                        Attempted call to patient and unable to reach.  Left detailed message per DPR regarding transmission.  Transmission results reviewed.    Diet:  N/A   Corvue Thoracic impedance suggesting possible fluid accumulation starting 4/5.  Also possible fluid accumulation from 3/11-3/20.         Prescribed: Spironolactone 25 mg take 1 tablet daily       Labs: 03/07/2022 Creatinine 1.14, BUN 17, Potassium 4.3, Sodium 137, GFR 65.95 A complete set of results can be found in Results Review.   Recommendations:  Left voice mail with ICM number and encouraged to call if experiencing any fluid symptoms.  Also left message with EP scheduler number to call for yearly EP appt.   Follow-up plan: ICM clinic phone appointment on 02/19/2024 to recheck fluid levels.  91 day device clinic remote transmission 04/09/2024.      EP/Cardiology Office Visits:  Recall 12/30/2023 with Dr Lalla Brothers (yearly).  Recall 08/17/2024 with Dr Allyson Sabal.   Copy of ICM check sent to Dr. Lalla Brothers.  Will send to Dr Allyson Sabal for review if patient is reached.   3 month ICM trend: 02/12/2024.    12-14 Month ICM trend:     Karie Soda, RN 02/12/2024 4:49 PM

## 2024-02-13 NOTE — Progress Notes (Signed)
 Remote ICD transmission.

## 2024-02-13 NOTE — Addendum Note (Signed)
 Addended by: Geralyn Flash D on: 02/13/2024 03:29 PM   Modules accepted: Orders

## 2024-02-19 ENCOUNTER — Ambulatory Visit: Attending: Cardiology

## 2024-02-19 DIAGNOSIS — Z95810 Presence of automatic (implantable) cardiac defibrillator: Secondary | ICD-10-CM

## 2024-02-19 DIAGNOSIS — I428 Other cardiomyopathies: Secondary | ICD-10-CM

## 2024-02-21 NOTE — Progress Notes (Signed)
 EPIC Encounter for ICM Monitoring  Patient Name: Jeffery Thomas is a 73 y.o. male Date: 02/21/2024 Primary Care Physican: Francenia Ingle, NP Primary Cardiologist: Katheryne Pane Electrophysiologist: Marven Slimmer 01/04/2023 office Weight: 210 lbs     06/12/2023 Weight: 220 lbs    06/21/2023 Weight: 205 lbs (loss due to COVID)          08/23/2023 Office Weight: 211 lbs                        Transmission results reviewed.    Diet:  N/A   Corvue Thoracic impedance suggesting fluid levels returned to normal 4/11.       Prescribed: Spironolactone 25 mg take 1 tablet daily       Labs: 03/07/2022 Creatinine 1.14, BUN 17, Potassium 4.3, Sodium 137, GFR 65.95 A complete set of results can be found in Results Review.   Recommendations: None   Follow-up plan: ICM clinic phone appointment on 03/18/2024.  91 day device clinic remote transmission 04/09/2024.      EP/Cardiology Office Visits:  Recall 12/30/2023 with Dr Marven Slimmer (yearly).  Recall 08/17/2024 with Dr Katheryne Pane.   Copy of ICM check sent to Dr. Marven Slimmer.   3 month ICM trend: 02/19/2024.    12-14 Month ICM trend:     Almyra Jain, RN 02/21/2024 7:46 AM

## 2024-03-12 ENCOUNTER — Ambulatory Visit: Payer: Medicare Other | Admitting: Dermatology

## 2024-03-12 DIAGNOSIS — D229 Melanocytic nevi, unspecified: Secondary | ICD-10-CM

## 2024-03-12 DIAGNOSIS — L570 Actinic keratosis: Secondary | ICD-10-CM | POA: Diagnosis not present

## 2024-03-12 DIAGNOSIS — L814 Other melanin hyperpigmentation: Secondary | ICD-10-CM | POA: Diagnosis not present

## 2024-03-12 DIAGNOSIS — Z85828 Personal history of other malignant neoplasm of skin: Secondary | ICD-10-CM

## 2024-03-12 DIAGNOSIS — L821 Other seborrheic keratosis: Secondary | ICD-10-CM | POA: Diagnosis not present

## 2024-03-12 DIAGNOSIS — D1801 Hemangioma of skin and subcutaneous tissue: Secondary | ICD-10-CM

## 2024-03-12 DIAGNOSIS — W908XXA Exposure to other nonionizing radiation, initial encounter: Secondary | ICD-10-CM

## 2024-03-12 DIAGNOSIS — Z1283 Encounter for screening for malignant neoplasm of skin: Secondary | ICD-10-CM

## 2024-03-12 DIAGNOSIS — L578 Other skin changes due to chronic exposure to nonionizing radiation: Secondary | ICD-10-CM

## 2024-03-12 NOTE — Progress Notes (Signed)
 Total Body Skin Exam (TBSE) Visit   Subjective  Jeffery Thomas is a 73 y.o. male who presents for the following: Skin Cancer Screening and Full Body Skin Exam  Patient presents today for follow up visit for TBSE. Patient was last evaluated on 09/13/2023 . Patient denies medication changes. Patient reports he  does have spots, moles and lesions of concern to be evaluated. Patient reports throughout his lifetime he  has had moderate sun exposure. Currently, patient reports if he  has excessive sun exposure, he  does apply sunscreen and/or wears protective coverings. Patient reports he  has hx of bx scc.  Patient denies  family history of skin cancers. The patient has spots, moles and lesions to be evaluated, some may be new or changing and the patient has concerns that these could be cancer.  The following portions of the chart were reviewed this encounter and updated as appropriate: medications, allergies, medical history  Review of Systems:  No other skin or systemic complaints except as noted in HPI or Assessment and Plan.  Objective  Well appearing patient in no apparent distress; mood and affect are within normal limits.  A full examination was performed including scalp, head, eyes, ears, nose, lips, neck, chest, axillae, abdomen, back, buttocks, bilateral upper extremities, bilateral lower extremities, hands, feet, fingers, toes, fingernails, and toenails. All findings within normal limits unless otherwise noted below.   Relevant physical exam findings are noted in the Assessment and Plan.    Assessment & Plan   LENTIGINES, SEBORRHEIC KERATOSES, HEMANGIOMAS - Benign normal skin lesions - Benign-appearing - Call for any changes  MELANOCYTIC NEVI - Tan-brown and/or pink-flesh-colored symmetric macules and papules - Benign appearing on exam today - Observation - Call clinic for new or changing moles - Recommend daily use of broad spectrum spf 30+ sunscreen to sun-exposed  areas.   ACTINIC DAMAGE - Chronic condition, secondary to cumulative UV/sun exposure - diffuse scaly erythematous macules with underlying dyspigmentation - Recommend daily broad spectrum sunscreen SPF 30+ to sun-exposed areas, reapply every 2 hours as needed.  - Staying in the shade or wearing long sleeves, sun glasses (UVA+UVB protection) and wide brim hats (4-inch brim around the entire circumference of the hat) are also recommended for sun protection.  - Call for new or changing lesions.  SKIN CANCER SCREENING PERFORMED TODAY.  - We will see patient back in the December to do Efudex Treatment. - We will give patient Aquaphor to place on spots that were frozen today in office        ACTINIC KERATOSIS (6) Left Anterior Mandible, Left Forearm - Anterior, Left Parotid Area, Right Anterior Mandible, Right Forearm - Anterior, Right Posterior Mandible Destruction of lesion - Left Anterior Mandible, Left Forearm - Anterior, Left Parotid Area, Right Anterior Mandible, Right Forearm - Anterior, Right Posterior Mandible Complexity: simple   Destruction method: cryotherapy   Informed consent: discussed and consent obtained   Timeout:  patient name, date of birth, surgical site, and procedure verified Lesion destroyed using liquid nitrogen: Yes   Region frozen until ice ball extended beyond lesion: Yes   Outcome: patient tolerated procedure well with no complications   Post-procedure details: wound care instructions given   Return for follow up in the winter.  Exie Holler, CMA, am acting as scribe for [employer], DO.   Documentation: I have reviewed the above documentation for accuracy and completeness, and I agree with the above.  Louana Roup, DO

## 2024-03-12 NOTE — Patient Instructions (Signed)

## 2024-03-14 ENCOUNTER — Other Ambulatory Visit: Payer: Self-pay | Admitting: Family Medicine

## 2024-03-14 DIAGNOSIS — K219 Gastro-esophageal reflux disease without esophagitis: Secondary | ICD-10-CM

## 2024-03-15 ENCOUNTER — Encounter: Payer: Self-pay | Admitting: Dermatology

## 2024-03-18 ENCOUNTER — Ambulatory Visit: Attending: Cardiology

## 2024-03-18 DIAGNOSIS — Z95810 Presence of automatic (implantable) cardiac defibrillator: Secondary | ICD-10-CM | POA: Diagnosis not present

## 2024-03-18 DIAGNOSIS — I428 Other cardiomyopathies: Secondary | ICD-10-CM

## 2024-03-19 NOTE — Progress Notes (Signed)
 EPIC Encounter for ICM Monitoring  Patient Name: Jeffery Thomas is a 73 y.o. male Date: 03/19/2024 Primary Care Physican: Francenia Ingle, NP Primary Cardiologist: Katheryne Pane Electrophysiologist: Marven Slimmer 01/04/2023 office Weight: 210 lbs     06/12/2023 Weight: 220 lbs    06/21/2023 Weight: 205 lbs (loss due to COVID)          08/23/2023 Office Weight: 211 lbs                        Attempted call to patient and unable to reach.  Left detailed message per DPR regarding transmission.  Transmission results reviewed.     Diet:  N/A   Corvue Thoracic impedance suggesting possible fluid accumulation starting 5/3.       Prescribed: Spironolactone  25 mg take 1 tablet daily       Labs: 03/07/2022 Creatinine 1.14, BUN 17, Potassium 4.3, Sodium 137, GFR 65.95 A complete set of results can be found in Results Review.   Recommendations:  Left voice mail with ICM number and encouraged to call if experiencing any fluid symptoms.   Follow-up plan: ICM clinic phone appointment on 03/25/2024 to recheck fluid levels.  91 day device clinic remote transmission 04/09/2024.      EP/Cardiology Office Visits:  Left phone number for EP scheduler on 5/13 and msg to make overdue appt with Dr Marven Slimmer.  Recall 12/30/2023 with Dr Marven Slimmer (yearly).  Recall 08/17/2024 with Dr Katheryne Pane.   Copy of ICM check sent to Dr. Marven Slimmer.   3 month ICM trend: 03/18/2024.    12-14 Month ICM trend:     Almyra Jain, RN 03/19/2024 9:07 AM

## 2024-03-25 ENCOUNTER — Ambulatory Visit: Attending: Cardiology

## 2024-03-25 DIAGNOSIS — I428 Other cardiomyopathies: Secondary | ICD-10-CM

## 2024-03-25 DIAGNOSIS — Z95810 Presence of automatic (implantable) cardiac defibrillator: Secondary | ICD-10-CM

## 2024-03-26 NOTE — Progress Notes (Signed)
 EPIC Encounter for ICM Monitoring  Patient Name: Jeffery Thomas is a 74 y.o. male Date: 03/26/2024 Primary Care Physican: Francenia Ingle, NP Primary Cardiologist: Katheryne Pane Electrophysiologist: Marven Slimmer 01/04/2023 office Weight: 210 lbs     06/12/2023 Weight: 220 lbs    06/21/2023 Weight: 205 lbs (loss due to COVID)          08/23/2023 Office Weight: 211 lbs                        Transmission results reviewed.     Diet:  N/A   Corvue Thoracic impedance suggesting possible fluid accumulation starting 5/3 and returned to normal 5/15.       Prescribed: Spironolactone  25 mg take 1 tablet daily       Labs: 03/07/2022 Creatinine 1.14, BUN 17, Potassium 4.3, Sodium 137, GFR 65.95 A complete set of results can be found in Results Review.   Recommendations:  No changes   Follow-up plan: ICM clinic phone appointment on 05/27/2024.  91 day device clinic remote transmission 04/09/2024.      EP/Cardiology Office Visits:  5/13 Left EP scheduler phone number and msg to call to schedule overdue appt with Dr Marven Slimmer.  Recall 12/30/2023 with Dr Marven Slimmer (yearly).  Recall 08/17/2024 with Dr Katheryne Pane.   Copy of ICM check sent to Dr. Marven Slimmer.   3 month ICM trend: 03/25/2024.    12-14 Month ICM trend:     Almyra Jain, RN 03/26/2024 5:22 PM

## 2024-04-06 ENCOUNTER — Other Ambulatory Visit: Payer: Self-pay | Admitting: Family

## 2024-04-06 DIAGNOSIS — R7303 Prediabetes: Secondary | ICD-10-CM

## 2024-04-09 ENCOUNTER — Ambulatory Visit (INDEPENDENT_AMBULATORY_CARE_PROVIDER_SITE_OTHER): Payer: Medicare Other

## 2024-04-09 DIAGNOSIS — I428 Other cardiomyopathies: Secondary | ICD-10-CM

## 2024-04-10 LAB — CUP PACEART REMOTE DEVICE CHECK
Battery Remaining Longevity: 57 mo
Battery Remaining Percentage: 54 %
Battery Voltage: 2.95 V
Brady Statistic RV Percent Paced: 1 %
Date Time Interrogation Session: 20250603230131
HighPow Impedance: 74 Ohm
HighPow Impedance: 74 Ohm
Implantable Lead Connection Status: 753985
Implantable Lead Implant Date: 20180828
Implantable Lead Location: 753860
Implantable Pulse Generator Implant Date: 20190915
Lead Channel Impedance Value: 380 Ohm
Lead Channel Pacing Threshold Amplitude: 1 V
Lead Channel Pacing Threshold Pulse Width: 0.5 ms
Lead Channel Sensing Intrinsic Amplitude: 11.3 mV
Lead Channel Setting Pacing Amplitude: 2.5 V
Lead Channel Setting Pacing Pulse Width: 0.5 ms
Lead Channel Setting Sensing Sensitivity: 0.5 mV
Pulse Gen Serial Number: 9851932

## 2024-04-13 ENCOUNTER — Ambulatory Visit: Payer: Self-pay | Admitting: Cardiology

## 2024-05-27 ENCOUNTER — Ambulatory Visit: Attending: Cardiology

## 2024-05-27 DIAGNOSIS — I428 Other cardiomyopathies: Secondary | ICD-10-CM | POA: Diagnosis not present

## 2024-05-27 DIAGNOSIS — Z95810 Presence of automatic (implantable) cardiac defibrillator: Secondary | ICD-10-CM

## 2024-05-29 NOTE — Progress Notes (Signed)
 EPIC Encounter for ICM Monitoring  Patient Name: Jeffery Thomas is a 73 y.o. male Date: 05/29/2024 Primary Care Physican: Billy Philippe JONELLE, NP Primary Cardiologist: Court Electrophysiologist: Cindie 01/04/2023 office Weight: 210 lbs     06/12/2023 Weight: 220 lbs    06/21/2023 Weight: 205 lbs (loss due to COVID)          08/23/2023 Office Weight: 211 lbs                        Transmission results reviewed.     Diet:  N/A   Corvue Thoracic impedance suggesting possible fluid accumulation starting 5/3 and returned to normal 5/15.       Prescribed: Spironolactone  25 mg take 1 tablet daily       Labs: 03/07/2022 Creatinine 1.14, BUN 17, Potassium 4.3, Sodium 137, GFR 65.95 A complete set of results can be found in Results Review.   Recommendations:  No changes   Follow-up plan: ICM clinic phone appointment on 07/01/2024.  91 day device clinic remote transmission 07/09/2024.      EP/Cardiology Office Visits:  Message sent 7/23 to EP scheduler to contact patient for overdue yearly appt with Dr Cindie.   Recall 12/30/2023 with Dr Cindie (yearly).  Recall 08/17/2024 with Dr Court.   Copy of ICM check sent to Dr. Cindie.   3 month ICM trend: 05/27/2024.    12-14 Month ICM trend:     Jeffery GORMAN Garner, RN 05/29/2024 4:42 PM

## 2024-06-04 NOTE — Progress Notes (Signed)
 Remote ICD transmission.

## 2024-06-04 NOTE — Addendum Note (Signed)
 Addended by: VICCI SELLER A on: 06/04/2024 04:14 PM   Modules accepted: Orders

## 2024-06-11 ENCOUNTER — Ambulatory Visit: Payer: Medicare Other | Admitting: Family Medicine

## 2024-06-27 ENCOUNTER — Encounter: Payer: Self-pay | Admitting: Emergency Medicine

## 2024-07-01 ENCOUNTER — Ambulatory Visit: Attending: Cardiology

## 2024-07-01 DIAGNOSIS — Z95810 Presence of automatic (implantable) cardiac defibrillator: Secondary | ICD-10-CM | POA: Diagnosis not present

## 2024-07-01 DIAGNOSIS — I428 Other cardiomyopathies: Secondary | ICD-10-CM | POA: Diagnosis not present

## 2024-07-03 ENCOUNTER — Telehealth: Payer: Self-pay

## 2024-07-03 NOTE — Progress Notes (Signed)
 EPIC Encounter for ICM Monitoring  Patient Name: Jeffery Thomas is a 73 y.o. male Date: 07/03/2024 Primary Care Physican: Billy Philippe JONELLE, NP Primary Cardiologist: Court Electrophysiologist: Cindie 01/04/2023 office Weight: 210 lbs     06/12/2023 Weight: 220 lbs    06/21/2023 Weight: 205 lbs (loss due to COVID)          08/23/2023 Office Weight: 211 lbs                        Attempted call to patient and unable to reach.  Left detailed message per DPR regarding transmission.  Transmission results reviewed.    Diet:  N/A   Corvue Thoracic impedance suggesting possible fluid accumulation starting 8/23.       Prescribed: Spironolactone  25 mg take 1 tablet daily       Labs: 03/07/2022 Creatinine 1.14, BUN 17, Potassium 4.3, Sodium 137, GFR 65.95 A complete set of results can be found in Results Review.   Recommendations:  Left voice mail with ICM number and encouraged to call if experiencing any fluid symptoms.   Follow-up plan: ICM clinic phone appointment on 07/09/2024 to recheck fluid levels.  91 day device clinic remote transmission 07/09/2024.      EP/Cardiology Office Visits:  EP scheduler unable to reach patient to schedule overdue yearly appt with Dr Cindie.   Recall 12/30/2023 with Dr Cindie (yearly).  Recall 08/17/2024 with Dr Court.   Copy of ICM check sent to Dr. Cindie.   3 month ICM trend: 07/01/2024.    12-14 Month ICM trend:     [name], RN 07/03/2024 7:58 AM

## 2024-07-03 NOTE — Telephone Encounter (Signed)
 Remote ICM transmission received.  Attempted call to patient regarding ICM remote transmission and left detailed message per DPR.  Left ICM phone number and advised to return call for any fluid symptoms or questions. Next ICM remote transmission scheduled 07/09/2024.  Advised to call the office to schedule overdue yearly office visit with Dr Cindie.

## 2024-07-04 ENCOUNTER — Other Ambulatory Visit: Payer: Self-pay | Admitting: Family Medicine

## 2024-07-04 DIAGNOSIS — R7303 Prediabetes: Secondary | ICD-10-CM

## 2024-07-09 ENCOUNTER — Ambulatory Visit (INDEPENDENT_AMBULATORY_CARE_PROVIDER_SITE_OTHER)

## 2024-07-09 ENCOUNTER — Ambulatory Visit: Payer: Medicare Other | Attending: Cardiology

## 2024-07-09 DIAGNOSIS — I4901 Ventricular fibrillation: Secondary | ICD-10-CM

## 2024-07-09 DIAGNOSIS — I428 Other cardiomyopathies: Secondary | ICD-10-CM

## 2024-07-09 DIAGNOSIS — Z95810 Presence of automatic (implantable) cardiac defibrillator: Secondary | ICD-10-CM

## 2024-07-10 NOTE — Progress Notes (Signed)
 EPIC Encounter for ICM Monitoring  Patient Name: Jeffery Thomas is a 73 y.o. male Date: 07/10/2024 Primary Care Physican: Billy Philippe JONELLE, NP Primary Cardiologist: Court Electrophysiologist: Cindie 01/04/2023 office Weight: 210 lbs     06/12/2023 Weight: 220 lbs    06/21/2023 Weight: 205 lbs (loss due to COVID)          08/23/2023 Office Weight: 211 lbs                        Transmission results reviewed.    Diet:  N/A   Corvue Thoracic impedance suggesting possible fluid accumulation starting 8/23 and returned to normal 8/27.       Prescribed: Spironolactone  25 mg take 1 tablet daily       Labs: 03/07/2022 Creatinine 1.14, BUN 17, Potassium 4.3, Sodium 137, GFR 65.95 A complete set of results can be found in Results Review.   Recommendations:  No changes.    Follow-up plan: ICM clinic phone appointment on 08/05/2024.  91 day device clinic remote transmission 07/09/2024.      EP/Cardiology Office Visits:  EP scheduler unable to reach patient to schedule overdue yearly appt with Dr Cindie.   Recall 12/30/2023 with Dr Cindie (yearly).  Recall 08/17/2024 with Dr Court.   Copy of ICM check sent to Dr. Cindie.    3 month ICM trend: 07/09/2024.    12-14 Month ICM trend:     [name], RN 07/10/2024 12:47 PM

## 2024-07-11 LAB — CUP PACEART REMOTE DEVICE CHECK
Battery Remaining Longevity: 56 mo
Battery Remaining Percentage: 53 %
Battery Voltage: 2.95 V
Brady Statistic RV Percent Paced: 1 %
Date Time Interrogation Session: 20250901040016
HighPow Impedance: 84 Ohm
HighPow Impedance: 84 Ohm
Implantable Lead Connection Status: 753985
Implantable Lead Implant Date: 20180828
Implantable Lead Location: 753860
Implantable Pulse Generator Implant Date: 20190915
Lead Channel Impedance Value: 410 Ohm
Lead Channel Pacing Threshold Amplitude: 1 V
Lead Channel Pacing Threshold Pulse Width: 0.5 ms
Lead Channel Sensing Intrinsic Amplitude: 11.9 mV
Lead Channel Setting Pacing Amplitude: 2.5 V
Lead Channel Setting Pacing Pulse Width: 0.5 ms
Lead Channel Setting Sensing Sensitivity: 0.5 mV
Pulse Gen Serial Number: 9851932

## 2024-07-13 ENCOUNTER — Ambulatory Visit: Payer: Self-pay | Admitting: Cardiology

## 2024-07-16 NOTE — Progress Notes (Signed)
Remote ICD Transmission.

## 2024-07-31 NOTE — Progress Notes (Signed)
 This encounter was created in error - please disregard.

## 2024-08-01 ENCOUNTER — Ambulatory Visit: Admitting: Pulmonary Disease

## 2024-08-01 ENCOUNTER — Other Ambulatory Visit: Payer: Self-pay | Admitting: Cardiovascular Disease

## 2024-08-01 DIAGNOSIS — I469 Cardiac arrest, cause unspecified: Secondary | ICD-10-CM

## 2024-08-01 DIAGNOSIS — I428 Other cardiomyopathies: Secondary | ICD-10-CM

## 2024-08-01 DIAGNOSIS — Z95810 Presence of automatic (implantable) cardiac defibrillator: Secondary | ICD-10-CM

## 2024-08-01 DIAGNOSIS — I10 Essential (primary) hypertension: Secondary | ICD-10-CM

## 2024-08-01 DIAGNOSIS — I4901 Ventricular fibrillation: Secondary | ICD-10-CM

## 2024-08-05 ENCOUNTER — Ambulatory Visit: Attending: Cardiology

## 2024-08-05 DIAGNOSIS — I428 Other cardiomyopathies: Secondary | ICD-10-CM | POA: Diagnosis not present

## 2024-08-05 DIAGNOSIS — Z95810 Presence of automatic (implantable) cardiac defibrillator: Secondary | ICD-10-CM | POA: Diagnosis not present

## 2024-08-07 NOTE — Progress Notes (Signed)
 EPIC Encounter for ICM Monitoring  Patient Name: Jeffery Thomas is a 73 y.o. male Date: 08/07/2024 Primary Care Physican: Billy Philippe JONELLE, NP Primary Cardiologist: Court Electrophysiologist: Cindie 01/04/2023 office Weight: 210 lbs     06/12/2023 Weight: 220 lbs    06/21/2023 Weight: 205 lbs (loss due to COVID)          08/23/2023 Office Weight: 211 lbs                        Transmission results reviewed.    Diet:  N/A   Since 07/01/2024 ICM Remote Transmission: Corvue Thoracic impedance suggesting possible fluid accumulation pattern alternating with possible dryness.       Prescribed: Spironolactone  25 mg take 1 tablet daily       Labs: 03/07/2022 Creatinine 1.14, BUN 17, Potassium 4.3, Sodium 137, GFR 65.95 A complete set of results can be found in Results Review.   Recommendations:  No changes.    Follow-up plan: ICM clinic phone appointment on 09/16/2024.  91 day device clinic remote transmission 10/08/2024.      EP/Cardiology Office Visits:  08/15/2024 with Charlies Arthur, PA.  Recall 08/17/2024 with Dr Court.   Copy of ICM check sent to Dr. Cindie.    Remote monitoring is medically necessary for Heart Failure Management.    90 day Daily Thoracic Impedance ICM trend: 05/07/2024 through 08/05/2024.    12-14 Month Thoracic Impedance ICM trend:     [name], RN 08/07/2024 4:06 PM

## 2024-08-12 NOTE — Progress Notes (Unsigned)
 Cardiology Office Note Date:  08/15/2024  Patient ID:  Jeffery Thomas, Jeffery Thomas 10-23-1951, MRN [id number] PCP:  Jerrell Cleatus Ned, MD  Cardiologist:  Dr. Court Electrophysiologist: Dr. Kelsie >>> Dr. Cindie (reading remotes)    Chief Complaint:   annual visit  History of Present Illness: Jeffery Thomas is a 73 y.o. male with history of HTN, HLD, GERD, cardiac arrest  2017-07-09.    He was in his usual state of health until after dinner this evening.  He reported chest pain to his wife and went back to the bedroom.  She heard him fall, found him down and unresponsive.  EMS was summoned, AED applied and he was shocked.  He was then found to be in PEA and got epinephrine .  He went into vfib again and was shocked, then pulseless VT and was shocked again. Finally attained perfusing rhythm with sinus tachy after about 20 minutes of CPR and treatment.  He underwent emergent cath with no CAD, LVEF 20%, was cooled. 06/28/17 LVEF by echo 35-40%.   ICD implanted Discharged 07/05/2017   Last saw Dr. Kelsie 2020, no symptoms, no shocks, EF had recovered, no changes were made.   He has seen cardiology a few times, most recently Dr. Court 09/24/21, remained active, no particular symptoms, on good tx, no changes were made.  Followed by [facility] clinic RN  I saw him 01/04/23 He feels great Exercised daily on his rowing machine, has excellent exertional capacity He has quit drinking all together No CP, palpitations or cardiac awareness No SOB, DOE No near syncope or syncope. No shocks Stable device findings No changes made  He saw Dr. Court 08/23/23 Doing well, exercising regularly  TODAY  He is doing very well  Denies any symptoms or concerns No near syncope, syncope, shocks No device concerns   Device information Abbott single chamber ICD implanted 07/04/2017 Premature battery depletion > gen change 07/22/2018 (Dr. 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   Gastroesophageal reflux disease    History of alcohol abuse    quit 08/ 20/ 2018   History of cardiac arrest    History of encephalopathy 07/05/2017   anoxic-ischemic    History of ST elevation myocardial infarction (STEMI) 07-09-17   sudden cardiac arrest /  per cardiac cath 09-Jul-2017 normal coronaries, ef <20% , severe LVSD; dx takatsuki syndrome   History of sudden cardiac arrest 07/09/2017   VF arrest / STEMI    Hypertension    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   premature generator change 07-22-2018--  followed by dr allred  ([facility])   Inguinal hernia    left   Left hydrocele    Left inguinal hernia    Left inguinal hernia 05/07/2019   Myocardial infarction [facility])    NICM (nonischemic cardiomyopathy) (HCC)    Jul 09, 2017  per cardiac cath , ef <20% (echo 35-40%)  ;  last echo 10-09-2014 ef 60-65%   OSA (obstructive sleep apnea)    03-26-2019 per pt had sleep study after heart attack, was told no recommendation , but did stop smoking and alcohol   Pre-diabetes    denies   ST elevation myocardial infarction (STEMI) [facility])    Sudden cardiac death (HCC) 2017-07-09   Sudden cardiac death/Takatsubo  syndrome     Jeffery Thomas  syndrome 06/26/2017   sudden cardiac arrest w/ VF and STEMI,  s/p  ICD 07-04-2017   Ventricular fibrillation (HCC) 06/26/2017   Ventricular fibrillation      Past Surgical History:  Procedure Laterality Date   CATARACT EXTRACTION W/ INTRAOCULAR LENS IMPLANT Left 2017   COLONOSCOPY     HYDROCELE EXCISION Left 04/02/2019   Procedure: HYDROCELECTOMY ADULT;  Surgeon: Nieves Cough, MD;  Location: [facility];  Service: Urology;  Laterality: Left;   ICD GENERATOR CHANGEOUT N/A 07/22/2018   Procedure: ICD GENERATOR CHANGEOUT;  Surgeon: Waddell Danelle ORN, MD;  Location: [facility] INVASIVE CV LAB;  Service: Cardiovascular;  Laterality: N/A;   ICD IMPLANT N/A 07/04/2017   SJM Fortify Assura VR ICD implanted by Dr Kelsie for secondary prevention after VF arrest   INGUINAL HERNIA REPAIR Left 05/07/2019   Procedure: OPEN REPAIR LEFT INGUINAL HERNIA WITH MESH;  Surgeon: Gail Favorite, MD;  Location: [facility] OR;  Service: General;  Laterality: Left;   LEFT HEART CATH AND CORONARY ANGIOGRAPHY N/A 06/26/2017   Procedure: LEFT HEART CATH AND CORONARY ANGIOGRAPHY;  Surgeon: Court Dorn PARAS, MD;  Location: [facility] INVASIVE CV LAB;  Service: Cardiovascular;  Laterality: N/A;   MENISCUS REPAIR Left 1983   left knee   TOTAL HIP ARTHROPLASTY Right 09/18/2019   Procedure: TOTAL HIP ARTHROPLASTY ANTERIOR APPROACH;  Surgeon: Melodi Lerner, MD;  Location: [facility] ORS;  Service: Orthopedics;  Laterality: Right;   TOTAL KNEE ARTHROPLASTY Left 2009  approx.   TOTAL KNEE REVISION Left 04/18/2018   Procedure: LEFT TOTAL KNEE REVISION;  Surgeon: Melodi Lerner, MD;  Location: [facility] ORS;  Service: Orthopedics;  Laterality: Left;  Adductor Block    Current Outpatient Medications  Medication Sig Dispense Refill   atorvastatin  (LIPITOR) 20 MG tablet TAKE 1 TABLET DAILY 90 tablet 3   carvedilol  (COREG ) 12.5 MG tablet TAKE 1 TABLET TWICE A DAY WITH A MEAL (CALL [phone] TO SCHEDULE AN OCTOBER APPOINTMENT FOR FUTURE REFILLS) 180 tablet 0   losartan  (COZAAR ) 100 MG tablet TAKE 1 TABLET DAILY 90 tablet 3   metFORMIN  (GLUCOPHAGE -XR) 500 MG 24 hr tablet TAKE 1 TABLET DAILY WITH BREAKFAST 30 tablet 11   omeprazole  (PRILOSEC) 40 MG capsule TAKE 1 CAPSULE DAILY 90 capsule 3   spironolactone  (ALDACTONE ) 25 MG tablet TAKE 1 TABLET DAILY 90 tablet 3   tacrolimus  (PROTOPIC ) 0.1 % ointment Apply topically 2 (two) times daily. As needed for irritation on face 100 g 0   No current facility-administered medications for this visit.    Allergies:   Patient has no known allergies.   Social  History:  The patient  reports that he quit smoking about 7 years ago. His smoking use included cigars. He has never used smokeless tobacco. He reports that he does not currently use alcohol after a past usage of about 6.0 standard drinks of alcohol per week. He reports that he does not use drugs.   Family History:  The patient's family history includes Cancer in his brother; Diabetes in his father; Hyperlipidemia in his father; Hypertension in his brother, father, and mother; Stroke in his paternal grandmother.  ROS:  Please see the history of present illness.    All other systems are reviewed and otherwise negative.   PHYSICAL EXAM:  VS:  BP (!) 158/82   Pulse (!) 57   Ht 5' 11 (1.803 m)   Wt 209 lb 9.6 oz (95.1 kg)   SpO2 96%   BMI 29.23 kg/m  BMI: Body mass  index is 29.23 kg/m. Well nourished, well developed, in no acute distress HEENT: normocephalic, atraumatic Neck: no JVD, carotid bruits or masses Cardiac:   RRR; no significant murmurs, no rubs, or gallops Lungs:  CTA b/l, no wheezing, rhonchi or rales Abd: soft, nontender MS: no deformity or  atrophy Ext: no edema Skin: warm and dry, no rash Neuro:  No gross deficits appreciated Psych: euthymic mood, full affect  ICD site is stable, no tethering or discomfort   EKG:  Done today and reviewed by myself shows  SB 57bpm, T inv, unchanged from last   Device interrogation done today and reviewed by myself:  Battery and lead measurements are good No VT   09/10/2019: TTE 1. Left ventricular ejection fraction, by visual estimation, is 60 to  65%. The left ventricle has normal function. There is no left ventricular  hypertrophy.   2. Left ventricular diastolic parameters are consistent with Grade I  diastolic dysfunction (impaired relaxation).   3. Global right ventricle has normal systolic function.The right  ventricular size is normal. No increase in right ventricular wall  thickness.   4. Left atrial size was normal.    5. Right atrial size was normal.   6. The mitral valve is normal in structure. No evidence of mitral valve  regurgitation. No evidence of mitral stenosis.   7. The tricuspid valve is normal in structure. Tricuspid valve  regurgitation is trivial.   8. The aortic valve is normal in structure. Aortic valve regurgitation is  not visualized. No evidence of aortic valve sclerosis or stenosis.   9. The pulmonic valve was normal in structure. Pulmonic valve  regurgitation is not visualized.  10. Normal pulmonary artery systolic pressure.  11. A pacer wire is visualized.  12. The inferior vena cava is normal in size with greater than 50%  respiratory variability, suggesting right atrial pressure of 3 mmHg.    06/26/17: [facility] IMPRESSION: Mr. Robarts has normal coronary arteries and severe LV dysfunction in a configuration consistent with Takatsubo  Syndrome. His EF is approximately 20%. He was hemodynamically stable with no complex. He has had no arrhythmias. Potassium was 3.1 and he will be repleted. He will be systemically cooled . Pulmonary critical care medicine is aware. The sheath was removed and pressure held on the groin to achieve hemostasis. The patient left the lab in stable condition.   2-D echocardiogram 06/28/2017: Study Conclusions - Left ventricle: The cavity size was normal. Wall thickness was   increased in a pattern of mild LVH. Systolic function was   moderately reduced. The estimated ejection fraction was in the   range of 35% to 40%. Diffuse hypokinesis. Doppler parameters are   consistent with abnormal left ventricular relaxation (grade 1   diastolic dysfunction). - Aortic valve: There was no stenosis. - Mitral valve: There was no significant regurgitation. - Right ventricle: The cavity size was normal. Systolic function   was mildly reduced. - Pulmonary arteries: No complete TR doppler jet so unable to   estimate PA systolic pressure. - Systemic veins: The IVC measured  2.2 cm with < 50% respirophasic   variation, suggesting RA pressure 15 mmHg.  Impressions: - Normal LV size with mild LV hypertrophy. EF 35-40%, diffuse   hypokinesis. Normal RV size with mildly decreased systolic   function. No significant valvular abnormalities.  Recent Labs: 08/13/2024: BUN 16; Creatinine, Ser 1.16; Potassium 4.1; Sodium 138  08/13/2024: Cholesterol 138; HDL 36.00; LDL Cholesterol 79; Total CHOL/HDL Ratio 4; Triglycerides 113.0; VLDL  22.6   Estimated Creatinine Clearance: 68.7 mL/min (by C-G formula based on SCr of 1.16 mg/dL).   Wt Readings from Last 3 Encounters:  08/15/24 209 lb 9.6 oz (95.1 kg)  08/13/24 207 lb (93.9 kg)  08/23/23 211 lb 3.2 oz (95.8 kg)     Other studies reviewed: Additional studies/records reviewed today include: summarized above  ASSESSMENT AND PLAN:  ICD Intact function No programming changes made  Cardiac arrest No syncope/symptoms NICM Recovered LVEF by his last echo 2020 No symptoms or exam findings of volume OL CorVue wobbles, currently down, though no symptoms or exam findings of volume, stable weight at home VP <1% C/w Dr. Brenna Due for a visit   Discussed need for new EP MD, will plan to transition to Dr. Almetta   Disposition: F/u with remotes as usual, in clinic with Dr. Marcey in a year, sooner if needed  Current medicines are reviewed at length with the patient today.  The patient did not have any concerns regarding medicines.  Bonney Charlies Arthur, PA-C 08/15/2024 8:32 AM     [facility] [address] [place] [address] [phone] (office)  [phone] (fax)

## 2024-08-13 ENCOUNTER — Encounter: Payer: Self-pay | Admitting: Student in an Organized Health Care Education/Training Program

## 2024-08-13 ENCOUNTER — Ambulatory Visit (INDEPENDENT_AMBULATORY_CARE_PROVIDER_SITE_OTHER): Admitting: Student in an Organized Health Care Education/Training Program

## 2024-08-13 VITALS — BP 136/85 | HR 85 | Ht 71.0 in | Wt 207.0 lb

## 2024-08-13 DIAGNOSIS — R7303 Prediabetes: Secondary | ICD-10-CM

## 2024-08-13 DIAGNOSIS — I5022 Chronic systolic (congestive) heart failure: Secondary | ICD-10-CM | POA: Diagnosis not present

## 2024-08-13 DIAGNOSIS — E782 Mixed hyperlipidemia: Secondary | ICD-10-CM | POA: Diagnosis not present

## 2024-08-13 DIAGNOSIS — I10 Essential (primary) hypertension: Secondary | ICD-10-CM | POA: Diagnosis not present

## 2024-08-13 DIAGNOSIS — K219 Gastro-esophageal reflux disease without esophagitis: Secondary | ICD-10-CM

## 2024-08-13 DIAGNOSIS — Z1211 Encounter for screening for malignant neoplasm of colon: Secondary | ICD-10-CM

## 2024-08-13 NOTE — Assessment & Plan Note (Signed)
 Chronic and stable hyperlipidemia.  No history of an ischemic event.  Using atorvastatin  20 mg daily.  Will check LDL today, goal less than 899.

## 2024-08-13 NOTE — Assessment & Plan Note (Signed)
 Chronic and stable.  Blood pressure is pretty well-controlled today.  Plan to continue with spironolactone  and losartan .  Will check BMP and urine microalbumin today.

## 2024-08-13 NOTE — Assessment & Plan Note (Signed)
 Last A1c 5.9% in 2023.  Currently using metformin  500 mg daily.  Will recheck an A1c today.

## 2024-08-13 NOTE — Progress Notes (Signed)
 Established Patient Office Visit  Subjective   Patient ID: SAADIQ POCHE, male    DOB: 1951-09-19  Age: 73 y.o. MRN: [id number]  Chief Complaint  Patient presents with   Transitions Of Care    HPI  Discussed the use of AI scribe software for clinical note transcription with the patient, who gave verbal consent to proceed.  History of Present Illness TIMTOHY BROSKI is a 73 year old male who presents for routine follow-up and colon cancer screening.  He has not been seen in over a year due to a misunderstanding about his provider assignment. He reports that his records indicate he is due for a colonoscopy in 09/04/24, having had two previous colonoscopies with normal results, the last being in 09/04/14.  He has a significant cardiac history, having experienced a ventricular fibrillation arrest in 2017/09/04, which led to a coma and subsequent recovery. He describes his recovery as miraculous and reports feeling great with no current heart-related symptoms. He is on carvedilol , losartan , spironolactone , atorvastatin , and Cardon for his heart condition, taking Cardon twice daily.  He has prediabetes and is on metformin . No current symptoms related to diabetes are reported.  He experiences gastroesophageal reflux disease (GERD) and takes omeprazole , which he finds effective in managing his symptoms. Skipping doses results in a quick return of symptoms.  He reports frequent urination but feels he empties his bladder fully with a normal stream. He does not wake up at night to urinate.  He is retired, having worked in [employer] and for [employer]. He leads an active lifestyle, working out and walking his dog regularly. He reports no limitations in physical activities and denies chest pain or shortness of breath.  He has had two knee replacements, with the first one breaking and requiring a second surgery. He also had a hernia repair three to four years ago.  He receives his medications through  the [facility] and [facility] and reports no issues with medication access. He recently received flu, shingles, and COVID vaccines in September.      Objective:     BP 136/85   Pulse 85   Ht 5' 11 (1.803 m)   Wt 207 lb (93.9 kg)   SpO2 95%   BMI 28.87 kg/m    Physical Exam  Gen: Well-appearing man. Heart: Regular, no murmur, ICD in place in the left upper chest with no skin breakdown Lungs: Unlabored, clear throughout. 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  Now he has chronic heart failure with previously reduced EF, last echo in 09/05/2019 showed that his EF had recovered on medical therapy.  Doing very well since then, excellent functional status.  NYHA class I symptoms.  Well compensated on exam today with no hypervolemia.  ICD is in place for secondary prevention of sudden cardiac death.  Tolerating goal-directed medical therapy well with carvedilol , losartan , spironolactone .  Because he is doing so well I do not think there is a need to add an SGLT2 inhibitor at this time.  Will check labs today and continue current medications.      Relevant Orders   Basic metabolic panel with GFR     Medium  Prediabetes (Chronic)   Last A1c 5.9% in 2023.  Currently using metformin  500 mg daily.  Will recheck an A1c today.      Relevant Orders   Hemoglobin A1c   Benign essential HTN (Chronic)   Chronic and stable.  Blood pressure is pretty well-controlled today.  Plan to continue with spironolactone  and losartan .  Will check BMP and urine microalbumin today.      Relevant Orders   Basic metabolic panel with GFR   Microalbumin / creatinine urine ratio   Hyperlipidemia (Chronic)   Chronic and  stable hyperlipidemia.  No history of an ischemic event.  Using atorvastatin  20 mg daily.  Will check LDL today, goal less than 899.      Relevant Orders   Lipid panel     Low   Gastroesophageal reflux disease (Chronic)   Chronic and stable.  Finds good benefit with using omeprazole  40 mg daily which we will continue.      Other Visit Diagnoses       Screening for colon cancer       Relevant Orders   Fecal occult blood, imunochemical       Return in about 6 months (around 02/11/2025).    Cleatus Debby Specking, MD

## 2024-08-13 NOTE — Patient Instructions (Signed)
  VISIT SUMMARY: You had a routine follow-up appointment today, which included a discussion about your colon cancer screening and overall health. We reviewed your medical history, including your heart condition, prediabetes, and GERD. You are feeling well and leading an active lifestyle. We also discussed your medications and recent vaccinations.  YOUR PLAN: -CARDIAC ARRHYTHMIA, STATUS POST VENTRICULAR FIBRILLATION ARREST WITH ICD: This condition refers to an irregular heartbeat that required an implantable cardioverter-defibrillator (ICD) after a serious heart event in 2018. You are currently asymptomatic, but due to your heart condition, you have an increased risk with anesthesia for a colonoscopy. We will monitor your ICD function and provide you with a stool-based colon cancer screening kit instead.  -ADULT WELLNESS VISIT: You are in good overall health and lead an active lifestyle. We performed a physical examination and ordered blood work to check your kidney function, cholesterol, and A1c levels.  -HYPERTENSION: This condition means you have high blood pressure. Your blood pressure readings today were slightly elevated, likely due to the 'white coat effect.' We will continue to monitor your blood pressure and reassess it at your next visit.  -HYPERLIPIDEMIA: This condition means you have high cholesterol levels. You are currently on atorvastatin , and we have ordered blood work to check your cholesterol levels.  -PREDIABETES: This condition means your blood sugar levels are higher than normal but not high enough to be classified as diabetes. You are on metformin , and we have ordered blood work to check your A1c levels.  -GASTROESOPHAGEAL REFLUX DISEASE (GERD): This condition means stomach acid frequently flows back into the tube connecting your mouth and stomach, causing irritation. You are on omeprazole , which effectively manages your symptoms. Continue taking omeprazole  as  prescribed.  INSTRUCTIONS: Please complete the blood work as ordered to check your kidney function, cholesterol, and A1c levels. Use the stool-based colon cancer screening kit provided. We will monitor your blood pressure and reassess it at your next visit. Continue taking all your medications as prescribed and maintain your active lifestyle.

## 2024-08-13 NOTE — Assessment & Plan Note (Signed)
 Chronic issue, sudden cardiac death in September 15, 2017 due to Takotsubo cardiomyopathy.  Now he has chronic heart failure with previously reduced EF, last echo in 2019/09/16 showed that his EF had recovered on medical therapy.  Doing very well since then, excellent functional status.  NYHA class I symptoms.  Well compensated on exam today with no hypervolemia.  ICD is in place for secondary prevention of sudden cardiac death.  Tolerating goal-directed medical therapy well with carvedilol , losartan , spironolactone .  Because he is doing so well I do not think there is a need to add an SGLT2 inhibitor at this time.  Will check labs today and continue current medications.

## 2024-08-13 NOTE — Assessment & Plan Note (Signed)
 Chronic and stable.  Finds good benefit with using omeprazole  40 mg daily which we will continue.

## 2024-08-14 LAB — MICROALBUMIN / CREATININE URINE RATIO
Creatinine,U: 66.3 mg/dL
Microalb Creat Ratio: UNDETERMINED mg/g (ref 0.0–30.0)
Microalb, Ur: <0.7 mg/dL

## 2024-08-14 LAB — BASIC METABOLIC PANEL WITH GFR
BUN: 16 mg/dL (ref 6–23)
CO2: 30 meq/L (ref 19–32)
Calcium: 9.8 mg/dL (ref 8.4–10.5)
Chloride: 100 meq/L (ref 96–112)
Creatinine, Ser: 1.16 mg/dL (ref 0.40–1.50)
GFR: 63.49 mL/min (ref >60.00)
Glucose, Bld: 118 mg/dL — ABNORMAL HIGH (ref 70–99)
Potassium: 4.1 meq/L (ref 3.5–5.1)
Sodium: 138 meq/L (ref 135–145)

## 2024-08-14 LAB — LIPID PANEL
Cholesterol: 138 mg/dL (ref 0–200)
HDL: 36 mg/dL — ABNORMAL LOW (ref >39.00)
LDL Cholesterol: 79 mg/dL (ref 0–99)
NonHDL: 101.5
Total CHOL/HDL Ratio: 4
Triglycerides: 113 mg/dL (ref 0.0–149.0)
VLDL: 22.6 mg/dL (ref 0.0–40.0)

## 2024-08-14 LAB — HEMOGLOBIN A1C: Hgb A1c MFr Bld: 6.3 % (ref 4.6–6.5)

## 2024-08-15 ENCOUNTER — Other Ambulatory Visit (HOSPITAL_COMMUNITY): Payer: Self-pay

## 2024-08-15 ENCOUNTER — Ambulatory Visit: Attending: Physician Assistant | Admitting: Physician Assistant

## 2024-08-15 ENCOUNTER — Encounter: Payer: Self-pay | Admitting: Physician Assistant

## 2024-08-15 ENCOUNTER — Ambulatory Visit: Payer: Self-pay | Admitting: Student in an Organized Health Care Education/Training Program

## 2024-08-15 VITALS — BP 158/82 | HR 57 | Ht 71.0 in | Wt 209.6 lb

## 2024-08-15 DIAGNOSIS — I428 Other cardiomyopathies: Secondary | ICD-10-CM | POA: Insufficient documentation

## 2024-08-15 DIAGNOSIS — Z95810 Presence of automatic (implantable) cardiac defibrillator: Secondary | ICD-10-CM | POA: Diagnosis not present

## 2024-08-15 DIAGNOSIS — I469 Cardiac arrest, cause unspecified: Secondary | ICD-10-CM | POA: Diagnosis not present

## 2024-08-15 DIAGNOSIS — I5022 Chronic systolic (congestive) heart failure: Secondary | ICD-10-CM | POA: Insufficient documentation

## 2024-08-15 LAB — CUP PACEART INCLINIC DEVICE CHECK
Battery Remaining Longevity: 56 mo
Brady Statistic RV Percent Paced: 0.01 %
Date Time Interrogation Session: 20251009083558
HighPow Impedance: 86.625
Implantable Lead Connection Status: 753985
Implantable Lead Implant Date: 20180828
Implantable Lead Location: 753860
Implantable Pulse Generator Implant Date: 20190915
Lead Channel Impedance Value: 400 Ohm
Lead Channel Pacing Threshold Amplitude: 0.75 V
Lead Channel Pacing Threshold Amplitude: 0.75 V
Lead Channel Pacing Threshold Pulse Width: 0.5 ms
Lead Channel Pacing Threshold Pulse Width: 0.5 ms
Lead Channel Sensing Intrinsic Amplitude: 11.9 mV
Lead Channel Setting Pacing Amplitude: 2.5 V
Lead Channel Setting Pacing Pulse Width: 0.5 ms
Lead Channel Setting Sensing Sensitivity: 0.5 mV
Pulse Gen Serial Number: 9851932

## 2024-08-15 NOTE — Patient Instructions (Addendum)
 Medication Instructions:  Your physician recommends that you continue on your current medications as directed. Please refer to the Current Medication list given to you today.  *If you need a refill on your cardiac medications before your next appointment, please call your pharmacy*  Lab Work: None ordered If you have labs (blood work) drawn today and your tests are completely normal, you will receive your results only by: MyChart Message (if you have MyChart) OR A paper copy in the mail If you have any lab test that is abnormal or we need to change your treatment, we will call you to review the results.  Follow-Up: At [facility], you and your health needs are our priority.  As part of our continuing mission to provide you with exceptional heart care, our providers are all part of one team.  This team includes your primary Cardiologist (physician) and Advanced Practice Providers or APPs (Physician Assistants and Nurse Practitioners) who all work together to provide you with the care you need, when you need it.  Your next appointment:   1 year(s)  Provider:   Donnice Primus, MD   Schedule 1 yr follow up with Dr Court per recall.

## 2024-08-19 ENCOUNTER — Ambulatory Visit: Payer: Self-pay | Admitting: Cardiology

## 2024-08-19 ENCOUNTER — Other Ambulatory Visit: Payer: Self-pay | Admitting: Cardiovascular Disease

## 2024-08-21 ENCOUNTER — Ambulatory Visit: Attending: Cardiovascular Disease | Admitting: Cardiovascular Disease

## 2024-08-21 ENCOUNTER — Encounter: Payer: Self-pay | Admitting: Cardiovascular Disease

## 2024-08-21 VITALS — BP 132/79 | HR 62 | Ht 71.0 in | Wt 211.3 lb

## 2024-08-21 DIAGNOSIS — I5181 Takotsubo syndrome: Secondary | ICD-10-CM | POA: Insufficient documentation

## 2024-08-21 DIAGNOSIS — I10 Essential (primary) hypertension: Secondary | ICD-10-CM | POA: Diagnosis present

## 2024-08-21 DIAGNOSIS — E782 Mixed hyperlipidemia: Secondary | ICD-10-CM | POA: Diagnosis present

## 2024-08-21 NOTE — Patient Instructions (Signed)

## 2024-08-21 NOTE — Assessment & Plan Note (Signed)
 History of hyperlipidemia on atorvastatin  20 mg a day with lipid profile performed 08/13/2024 revealing total cholesterol 138, LDL 79 and HDL 36, appropriate for primary prevention.

## 2024-08-21 NOTE — Assessment & Plan Note (Signed)
 Patient had witnessed sudden cardiac death successfully resuscitated with a ROSC of 20 minutes.  Acute Performed by myself 06/26/2017 revealed normal coronary arteries.  He is placed on GDMT and subsequently had normalization of LV function.  He did have an ICD implanted for secondary prevention and had generator change because of early battery depletion by Dr. Waddell 07/20/2018.

## 2024-08-21 NOTE — Progress Notes (Signed)
 08/21/2024 Jasiri Hanawalt Coates   [birth year]  [id number]  Primary Physician Jerrell Cleatus Ned, MD Primary Cardiologist: Dorn JINNY Lesches MD FACP, [place], [place], [place]  HPI:  Jeffery Thomas is a 73 y.o.   married, father of one with no grandchildren who has not worked the last 10 years a and who I last saw in the office 08/23/2023.  He had witnessed cardiac death on the evening of 06-Jul-2017. He had fairly immediate CPR after the EMS was called and was shocked 3 times. He had CPR and had R OSC in 20 minutes. He is transported to [facility] where I performed urgent cardiac catheterization revealing normal coronary arteries and severe LV dysfunction consistent with Takatsubo syndrome. 8 days later he had an ICD implanted by Dr. Kelsie for secondary prevention. He was discharged home from the hospital 3 weeks ago. Prior to his presentation he did have a history of treated hypertension and hyperlipidemia. His mother died of a myocardial infarction at age 45. He does smoke cigarettes or playing golf and prior to this drank several hard drinks of liquor a night. Since that time his drinking and has been markedly limited. He has no recollection of the event or one week prior to the event. He has no neurologic deficits otherwise.     He has had an ICD implanted by Dr. Kelsie September 2018 for secondary prevention. 2-D echo performed 10/09/17 showed complete normalization of LV function.  He had his ICD generator explanted because of early battery depletion by Dr. Waddell 07/20/2018 with a new device installed.  Is followed by Dr. Kelsie as an outpatient.  He is not had an ICD discharge.  He denies chest pain or shortness of breath.  He does occasionally smoke a cigar when playing golf.  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 Melodi.   Since I saw him 12 months ago he continues to do well.  He works out 6 days a week, rows and lifts weights and is completely  asymptomatic.  He has not had an ICD discharge.   Current Meds  Medication Sig   atorvastatin  (LIPITOR) 20 MG tablet TAKE 1 TABLET DAILY   carvedilol  (COREG ) 12.5 MG tablet TAKE 1 TABLET TWICE A DAY WITH A MEAL (CALL [phone] TO SCHEDULE AN OCTOBER APPOINTMENT FOR FUTURE REFILLS)   losartan  (COZAAR ) 100 MG tablet TAKE 1 TABLET DAILY   metFORMIN  (GLUCOPHAGE -XR) 500 MG 24 hr tablet TAKE 1 TABLET DAILY WITH BREAKFAST   omeprazole  (PRILOSEC) 40 MG capsule TAKE 1 CAPSULE DAILY   spironolactone  (ALDACTONE ) 25 MG tablet TAKE 1 TABLET DAILY   tacrolimus  (PROTOPIC ) 0.1 % ointment Apply topically 2 (two) times daily. As needed for irritation on face     No Known Allergies  Social History   Socioeconomic History   Marital status: Married    Spouse name: Not on file   Number of children: 1   Years of education: Not on file   Highest education level: 12th grade  Occupational History   Not on file  Tobacco Use   Smoking status: Former    Types: Cigars    Quit date: July 06, 2017    Years since quitting: 7.1   Smokeless tobacco: Never  Vaping Use   Vaping status: Never Used  Substance and Sexual Activity   Alcohol use: Not Currently    Alcohol/week: 6.0 standard drinks of alcohol    Types: 6 Shots of liquor per week  Comment: Quit: 2020   Drug use: Never   Sexual activity: Yes  Other Topics Concern   Not on file  Social History Narrative   Not on file   Social Drivers of Health   Financial Resource Strain: Low Risk  (08/11/2024)   Overall Financial Resource Strain (CARDIA)    Difficulty of Paying Living Expenses: Not hard at all  Food Insecurity: No Food Insecurity (08/11/2024)   Hunger Vital Sign    Worried About Running Out of Food in the Last Year: Never true    Ran Out of Food in the Last Year: Never true  Transportation Needs: No Transportation Needs (08/11/2024)   PRAPARE - Administrator, Civil Service (Medical): No    Lack of Transportation (Non-Medical):  No  Physical Activity: Sufficiently Active (08/11/2024)   Exercise Vital Sign    Days of Exercise per Week: 6 days    Minutes of Exercise per Session: 90 min  Stress: No Stress Concern Present (08/11/2024)   [employer] of Occupational Health - Occupational Stress Questionnaire    Feeling of Stress: Not at all  Social Connections: Moderately Isolated (08/11/2024)   Social Connection and Isolation Panel    Frequency of Communication with Friends and Family: Twice a week    Frequency of Social Gatherings with Friends and Family: Once a week    Attends Religious Services: Never    Database administrator or Organizations: No    Attends Engineer, structural: Not on file    Marital Status: Married  Catering manager Violence: Not At Risk (06/29/2021)   Humiliation, Afraid, Rape, and Kick questionnaire    Fear of Current or Ex-Partner: No    Emotionally Abused: No    Physically Abused: No    Sexually Abused: No     Review of Systems: General: negative for chills, fever, night sweats or weight changes.  Cardiovascular: negative for chest pain, dyspnea on exertion, edema, orthopnea, palpitations, paroxysmal nocturnal dyspnea or shortness of breath Dermatological: negative for rash Respiratory: negative for cough or wheezing Urologic: negative for hematuria Abdominal: negative for nausea, vomiting, diarrhea, bright red blood per rectum, melena, or hematemesis Neurologic: negative for visual changes, syncope, or dizziness All other systems reviewed and are otherwise negative except as noted above.    Blood pressure 132/79, pulse 62, height 5' 11 (1.803 m), weight 211 lb 4.8 oz (95.8 kg), SpO2 94%.  General appearance: alert and no distress Neck: no adenopathy, no carotid bruit, no JVD, supple, symmetrical, trachea midline, and thyroid  not enlarged, symmetric, no tenderness/mass/nodules Lungs: clear to auscultation bilaterally Heart: regular rate and rhythm, S1, S2 normal, no  murmur, click, rub or gallop Extremities: extremities normal, atraumatic, no cyanosis or edema Pulses: 2+ and symmetric Skin: Skin color, texture, turgor normal. No rashes or lesions Neurologic: Grossly normal  EKG not performed today      ASSESSMENT AND PLAN:   Benign essential HTN History of essential hypertension blood pressure measured today at 132/79.  He is on carvedilol  and losartan  as well as spironolactone .  Hyperlipidemia History of hyperlipidemia on atorvastatin  20 mg a day with lipid profile performed 08/13/2024 revealing total cholesterol 138, LDL 79 and HDL 36, appropriate for primary prevention.  Takotsubo syndrome Patient had witnessed sudden cardiac death successfully resuscitated with a ROSC of 20 minutes.  Acute Performed by myself 06/26/2017 revealed normal coronary arteries.  He is placed on GDMT and subsequently had normalization of LV function.  He did have an ICD  implanted for secondary prevention and had generator change because of early battery depletion by Dr. Waddell 07/20/2018.     Dorn DOROTHA Lesches MD FACP,FACC,FAHA, [facility] 08/21/2024 11:28 AM

## 2024-08-21 NOTE — Assessment & Plan Note (Signed)
 History of essential hypertension blood pressure measured today at 132/79.  He is on carvedilol  and losartan  as well as spironolactone .

## 2024-09-13 ENCOUNTER — Other Ambulatory Visit: Payer: Self-pay | Admitting: Cardiovascular Disease

## 2024-09-16 ENCOUNTER — Ambulatory Visit: Attending: Cardiology

## 2024-09-16 DIAGNOSIS — Z95810 Presence of automatic (implantable) cardiac defibrillator: Secondary | ICD-10-CM

## 2024-09-16 DIAGNOSIS — I5022 Chronic systolic (congestive) heart failure: Secondary | ICD-10-CM | POA: Diagnosis not present

## 2024-09-18 ENCOUNTER — Telehealth: Payer: Self-pay

## 2024-09-18 NOTE — Progress Notes (Signed)
 EPIC Encounter for ICM Monitoring  Patient Name: Jeffery Thomas is a 73 y.o. male Date: 09/18/2024 Primary Care Physican: Jerrell Cleatus Ned, MD Primary Cardiologist: Court Electrophysiologist: Cindie 01/04/2023 office Weight: 210 lbs     06/12/2023 Weight: 220 lbs    06/21/2023 Weight: 205 lbs (loss due to COVID)          08/23/2023 Office Weight: 211 lbs                        Attempted call to patient and unable to reach.  Left detailed message per DPR regarding transmission.  Transmission results reviewed.    Diet:  N/A   Since 08/05/2024 ICM Remote Transmission: [facility] Thoracic impedance suggesting normal fluid levels with the exception of possible fluid accumulation from 09/01/2024-09/13/2024.    Prescribed: Spironolactone  25 mg take 1 tablet daily       Labs: 03/07/2022 Creatinine 1.14, BUN 17, Potassium 4.3, Sodium 137, GFR 65.95 A complete set of results can be found in Results Review.   Recommendations:  Left voice mail with ICM number and encouraged to call if experiencing any fluid symptoms.   Follow-up plan: ICM clinic phone appointment on 10/21/2024.  91 day device clinic remote transmission 10/08/2024.      EP/Cardiology Office Visits:  08/15/2024 with Charlies Arthur, PA.  Recall 08/17/2024 with Dr Court.   Copy of ICM check sent to Dr. Cindie.    Remote monitoring is medically necessary for Heart Failure Management.    Daily Thoracic Impedance ICM trend: 06/18/2024 through 09/16/2024.    12-14 Month Thoracic Impedance ICM trend:     [name], RN 09/18/2024 4:24 PM

## 2024-09-18 NOTE — Telephone Encounter (Signed)
 Remote ICM transmission received.  Attempted call to patient regarding ICM remote transmission.  Left detailed message per DPR with ICM phone number to return call for any questions, concerns or fluid symptoms.

## 2024-10-08 ENCOUNTER — Ambulatory Visit: Payer: Medicare Other

## 2024-10-08 DIAGNOSIS — I428 Other cardiomyopathies: Secondary | ICD-10-CM | POA: Diagnosis not present

## 2024-10-09 ENCOUNTER — Encounter: Payer: Self-pay | Admitting: Dermatology

## 2024-10-09 ENCOUNTER — Ambulatory Visit: Admitting: Dermatology

## 2024-10-09 VITALS — BP 169/90

## 2024-10-09 DIAGNOSIS — Z8589 Personal history of malignant neoplasm of other organs and systems: Secondary | ICD-10-CM

## 2024-10-09 DIAGNOSIS — W908XXA Exposure to other nonionizing radiation, initial encounter: Secondary | ICD-10-CM | POA: Diagnosis not present

## 2024-10-09 DIAGNOSIS — Z85828 Personal history of other malignant neoplasm of skin: Secondary | ICD-10-CM | POA: Diagnosis not present

## 2024-10-09 DIAGNOSIS — L578 Other skin changes due to chronic exposure to nonionizing radiation: Secondary | ICD-10-CM

## 2024-10-09 DIAGNOSIS — L570 Actinic keratosis: Secondary | ICD-10-CM

## 2024-10-09 LAB — CUP PACEART REMOTE DEVICE CHECK
Battery Remaining Longevity: 53 mo
Battery Remaining Percentage: 51 %
Battery Voltage: 2.95 V
Brady Statistic RV Percent Paced: 1 %
Date Time Interrogation Session: 20251202020015
HighPow Impedance: 82 Ohm
HighPow Impedance: 82 Ohm
Implantable Lead Connection Status: 753985
Implantable Lead Implant Date: 20180828
Implantable Lead Location: 753860
Implantable Pulse Generator Implant Date: 20190915
Lead Channel Impedance Value: 390 Ohm
Lead Channel Pacing Threshold Amplitude: 0.75 V
Lead Channel Pacing Threshold Pulse Width: 0.5 ms
Lead Channel Sensing Intrinsic Amplitude: 11.9 mV
Lead Channel Setting Pacing Amplitude: 2.5 V
Lead Channel Setting Pacing Pulse Width: 0.5 ms
Lead Channel Setting Sensing Sensitivity: 0.5 mV
Pulse Gen Serial Number: 9851932

## 2024-10-09 NOTE — Progress Notes (Signed)
   Follow-Up Visit   Subjective  Jeffery Thomas is a 73 y.o. male established patient who presents for FOLLOW UP on the diagnoses listed below:  Patient was last evaluated on 03/12/24 for TBSE.   AK: Cryotherapy with liquid nitrogen was performed at [facility] on the Left Anterior Mandible, Left Forearm - Anterior, Left Parotid Area, Right Anterior Mandible, Right Forearm - Anterior, Right Posterior Mandible. Pt is here today for recheck.   Spot Check: Pt las a lesion on the lower lip that he stated presented 39mo ago. He has been using chap sticks and Aquaphor but it does not help.    The following portions of the chart were reviewed this encounter and updated as appropriate: medications, allergies, medical history  Review of Systems:  No other skin or systemic complaints except as noted in HPI or Assessment and Plan.  Objective  Well appearing patient in no apparent distress; mood and affect are within normal limits.   A focused examination was performed of the following areas:upper body   Relevant exam findings are noted in the Assessment and Plan.         Assessment & Plan   ACTINIC KERATOSIS Exam: Erythematous thin papules/macules with gritty scale at the face, lip, forearms  Treatment Plan: - Cryotherapy performed with liquid nitrogen    HISTORY OF SQUAMOUS CELL CARCINOMA OF THE SKIN - Well healed scar on L neck  - No evidence of recurrence today - No lymphadenopathy - Recommend regular full body skin exams - Recommend daily broad spectrum sunscreen SPF 30+ to sun-exposed areas, reapply every 2 hours as needed.  - Call if any new or changing lesions are noted between office visits   ACTINIC DAMAGE - chronic, secondary to cumulative UV radiation exposure/sun exposure over time - diffuse scaly erythematous macules with underlying dyspigmentation - Recommend daily broad spectrum sunscreen SPF 30+ to sun-exposed areas, reapply every 2 hours as needed.  - Recommend  staying in the shade or wearing long sleeves, sun glasses (UVA+UVB protection) and wide brim hats (4-inch brim around the entire circumference of the hat). - Call for new or changing lesions.    AK (ACTINIC KERATOSIS) (11) Mid Forehead, R temple, forehead, nose (6), Mid Lower Vermilion Lip, Right Forearm - Anterior (4) Related Procedures Destruction of lesion Complexity: simple   Destruction method: cryotherapy   Informed consent: discussed and consent obtained   Timeout:  patient name, date of birth, surgical site, and procedure verified Lesion destroyed using liquid nitrogen: Yes   Region frozen until ice ball extended beyond lesion: Yes   Outcome: patient tolerated procedure well with no complications   Post-procedure details: wound care instructions given     No follow-ups on file.   Documentation: I have reviewed the above documentation for accuracy and completeness, and I agree with the above.  I, Shirron Maranda, CMA II, am acting as scribe for:  Delon Lenis, DO

## 2024-10-09 NOTE — Patient Instructions (Addendum)

## 2024-10-11 NOTE — Progress Notes (Signed)
 Remote ICD Transmission

## 2024-10-17 ENCOUNTER — Ambulatory Visit (INDEPENDENT_AMBULATORY_CARE_PROVIDER_SITE_OTHER)

## 2024-10-17 VITALS — BP 120/80 | Ht 71.0 in | Wt 205.0 lb

## 2024-10-17 DIAGNOSIS — Z0000 Encounter for general adult medical examination without abnormal findings: Secondary | ICD-10-CM | POA: Diagnosis not present

## 2024-10-17 NOTE — Progress Notes (Signed)
 I connected with  Jeffery Thomas on 10/17/2024 by a audio enabled telemedicine application and verified that I am speaking with the correct person using two identifiers.  Patient Location: Home  Provider Location: Home Office  Persons Participating in Visit: Patient.  I discussed the limitations of evaluation and management by telemedicine. The patient expressed understanding and agreed to proceed.  Vital Signs: Because this visit was a virtual/telehealth visit, some criteria may be missing or patient reported. Any vitals not documented were not able to be obtained and vitals that have been documented are patient reported.   Because this visit was a virtual/telehealth visit,  certain criteria was not obtained, such a blood pressure, CBG if applicable, and timed get up and go. Any medications not marked as taking were not mentioned during the medication reconciliation part of the visit. Any vitals not documented were not able to be obtained due to this being a telehealth visit or patient was unable to self-report a recent blood pressure reading due to a lack of equipment at home via telehealth. Vitals that have been documented are verbally provided by the patient.  Chief Complaint  Patient presents with   Medicare Wellness     Subjective:   Jeffery Thomas is a 73 y.o. male who presents for a Medicare Annual Wellness Visit.  Visit info / Clinical Intake: Medicare Wellness Visit Type:: Subsequent Annual Wellness Visit Persons participating in visit and providing information:: patient Medicare Wellness Visit Mode:: Telephone If telephone:: video error Since this visit was completed virtually, some vitals may be partially provided or unavailable. Missing vitals are due to the limitations of the virtual format.: Documented vitals are patient reported If Telephone or Video please confirm:: I connected with patient using audio/video enable telemedicine. I verified patient identity with two  identifiers, discussed telehealth limitations, and patient agreed to proceed. Patient Location:: home Provider Location:: home office Interpreter Needed?: No Pre-visit prep was completed: yes AWV questionnaire completed by patient prior to visit?: yes Date:: 10/14/24 Living arrangements:: (Patient-Rptd) lives with spouse/significant other Patient's Overall Health Status Rating: (Patient-Rptd) excellent Typical amount of pain: (Patient-Rptd) none Does pain affect daily life?: (Patient-Rptd) no Are you currently prescribed opioids?: no  Dietary Habits and Nutritional Risks How many meals a day?: (Patient-Rptd) 3 Eats fruit and vegetables daily?: (Patient-Rptd) yes Most meals are obtained by: (Patient-Rptd) preparing own meals In the last 2 weeks, have you had any of the following?: none Diabetic:: no  Functional Status Activities of Daily Living (to include ambulation/medication): (Patient-Rptd) Independent Ambulation: (Patient-Rptd) Independent Medication Administration: (Patient-Rptd) Independent Home Management (perform basic housework or laundry): (Patient-Rptd) Independent Manage your own finances?: (Patient-Rptd) yes Primary transportation is: (Patient-Rptd) driving Concerns about vision?: no *vision screening is required for WTM* Concerns about hearing?: no  Fall Screening Falls in the past year?: 0 Number of falls in past year: 0 Was there an injury with Fall?: 0 Fall Risk Category Calculator: 0 Patient Fall Risk Level: Low Fall Risk  Fall Risk Patient at Risk for Falls Due to: No Fall Risks Fall risk Follow up: Falls evaluation completed  Home and Transportation Safety: All rugs have non-skid backing?: (!) (Patient-Rptd) no All stairs or steps have railings?: (Patient-Rptd) yes Grab bars in the bathtub or shower?: (!) (Patient-Rptd) no Have non-skid surface in bathtub or shower?: (Patient-Rptd) yes Good home lighting?: (Patient-Rptd) yes Regular seat belt use?:  (Patient-Rptd) yes Hospital stays in the last year:: (Patient-Rptd) no  Cognitive Assessment Difficulty concentrating, remembering, or making decisions? : (Patient-Rptd)  no Will 6CIT or Mini Cog be Completed: yes What year is it?: 0 points What month is it?: 0 points Give patient an address phrase to remember (5 components): remember apple, table, penny About what time is it?: 0 points Count backwards from 20 to 1: 0 points Say the months of the year in reverse: 0 points Repeat the address phrase from earlier: 0 points 6 CIT Score: 0 points  Advance Directives (For Healthcare) Does Patient Have a Medical Advance Directive?: Yes Does patient want to make changes to medical advance directive?: No - Patient declined Type of Advance Directive: Healthcare Power of [place]; Living will Copy of Healthcare Power of Attorney in Chart?: No - copy requested Copy of Living Will in Chart?: No - copy requested  Reviewed/Updated  Reviewed/Updated: Reviewed All (Medical, Surgical, Family, Medications, Allergies, Care Teams, Patient Goals)    Allergies (verified) Patient has no known allergies.   Current Medications (verified) Outpatient Encounter Medications as of 10/17/2024  Medication Sig   atorvastatin  (LIPITOR) 20 MG tablet TAKE 1 TABLET DAILY   carvedilol  (COREG ) 12.5 MG tablet TAKE 1 TABLET TWICE A DAY WITH A MEAL (CALL [phone] TO SCHEDULE AN OCTOBER APPOINTMENT FOR FUTURE REFILLS)   losartan  (COZAAR ) 100 MG tablet TAKE 1 TABLET DAILY   metFORMIN  (GLUCOPHAGE -XR) 500 MG 24 hr tablet TAKE 1 TABLET DAILY WITH BREAKFAST   omeprazole  (PRILOSEC) 40 MG capsule TAKE 1 CAPSULE DAILY   spironolactone  (ALDACTONE ) 25 MG tablet TAKE 1 TABLET DAILY   tacrolimus  (PROTOPIC ) 0.1 % ointment Apply topically 2 (two) times daily. As needed for irritation on face   No facility-administered encounter medications on file as of 10/17/2024.    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   Gastroesophageal reflux disease    History of alcohol abuse    quit 08/ 20/ 2018   History of cardiac arrest    History of encephalopathy 07/05/2017   anoxic-ischemic    History of ST elevation myocardial infarction (STEMI) 06-27-2017   sudden cardiac arrest /  per cardiac cath 06-27-17 normal coronaries, ef <20% , severe LVSD; dx takatsuki syndrome   History of sudden cardiac arrest 06-27-17   VF arrest / STEMI    Hypertension    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   premature generator change 07-22-2018--  followed by dr allred  ([facility], [place])   Inguinal hernia    left   Left hydrocele    Left inguinal hernia    Left inguinal hernia 05/07/2019   Myocardial infarction [facility])    NICM (nonischemic cardiomyopathy) (HCC)    2017/06/27  per cardiac cath , ef <20% (echo 35-40%)  ;  last echo 10-09-2014 ef 60-65%   OSA (obstructive sleep apnea)    03-26-2019 per pt had sleep study after heart attack, was told no recommendation , but did stop smoking and alcohol   Pre-diabetes    denies   ST elevation myocardial infarction (STEMI) (HCC)    Sudden cardiac death (HCC) 2017/06/27   Sudden cardiac death/Takatsubo  syndrome     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   sudden cardiac arrest w/ VF and STEMI,  s/p  ICD 07-04-2017   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   Ventricular fibrillation     Past Surgical History:  Procedure Laterality Date   CATARACT EXTRACTION W/ INTRAOCULAR LENS IMPLANT Left 2017  COLONOSCOPY     HYDROCELE EXCISION Left 04/02/2019   Procedure: HYDROCELECTOMY ADULT;  Surgeon: Nieves Cough, MD;  [facility]: [facility];  Service: Urology;  Laterality: Left;   ICD GENERATOR CHANGEOUT N/A 07/22/2018   Procedure: ICD GENERATOR CHANGEOUT;  Surgeon: Waddell Danelle ORN, MD;   Location: [facility] INVASIVE CV LAB;  Service: Cardiovascular;  Laterality: N/A;   ICD IMPLANT N/A 07/04/2017   SJM Fortify Assura VR ICD implanted by Dr Kelsie for secondary prevention after VF arrest   INGUINAL HERNIA REPAIR Left 05/07/2019   Procedure: OPEN REPAIR LEFT INGUINAL HERNIA WITH MESH;  Surgeon: Gail Favorite, MD;  Location: [facility] OR;  Service: General;  Laterality: Left;   LEFT HEART CATH AND CORONARY ANGIOGRAPHY N/A 06/26/2017   Procedure: LEFT HEART CATH AND CORONARY ANGIOGRAPHY;  Surgeon: Court Dorn PARAS, MD;  Location: [facility] INVASIVE CV LAB;  Service: Cardiovascular;  Laterality: N/A;   MENISCUS REPAIR Left 1983   left knee   TOTAL HIP ARTHROPLASTY Thomas 09/18/2019   Procedure: TOTAL HIP ARTHROPLASTY ANTERIOR APPROACH;  Surgeon: Melodi Lerner, MD;  Location: [facility] ORS;  Service: Orthopedics;  Laterality: Thomas;   TOTAL KNEE ARTHROPLASTY Left 2009  approx.   TOTAL KNEE REVISION Left 04/18/2018   Procedure: LEFT TOTAL KNEE REVISION;  Surgeon: Melodi Lerner, MD;  Location: [facility] ORS;  Service: Orthopedics;  Laterality: Left;  Adductor Block   Family History  Problem Relation Age of Onset   Hypertension Mother    Hyperlipidemia Father    Hypertension Father    Diabetes Father    Hypertension Brother    Cancer Brother        Pancreatic   Stroke Paternal Grandmother    Social History   Occupational History   Not on file  Tobacco Use   Smoking status: Former    Types: Cigars    Quit date: 06/26/2017    Years since quitting: 7.3   Smokeless tobacco: Never  Vaping Use   Vaping status: Never Used  Substance and Sexual Activity   Alcohol use: Not Currently    Alcohol/week: 6.0 standard drinks of alcohol    Types: 6 Shots of liquor per week    Comment: Quit: 2020   Drug use: Never   Sexual activity: Yes   Tobacco Counseling Counseling given: Not Answered  SDOH Screenings   Food Insecurity: No Food Insecurity (10/14/2024)  Housing: Low Risk (10/14/2024)  Transportation Needs: No  Transportation Needs (10/14/2024)  Utilities: Not At Risk (10/17/2024)  Alcohol Screen: Low Risk (06/21/2023)  Depression (PHQ2-9): Low Risk (10/17/2024)  Financial Resource Strain: Low Risk (10/14/2024)  Physical Activity: Sufficiently Active (10/14/2024)  Social Connections: Socially Isolated (10/14/2024)  Stress: No Stress Concern Present (10/14/2024)  Tobacco Use: Medium Risk (10/17/2024)  Health Literacy: Adequate Health Literacy (10/17/2024)   See flowsheets for full screening details  Depression Screen PHQ 2 & 9 Depression Scale- Over the past 2 weeks, how often have you been bothered by any of the following problems? Jeffery interest or pleasure in doing things: 0 Feeling down, depressed, or hopeless (PHQ Adolescent also includes...irritable): 0 PHQ-2 Total Score: 0 Trouble falling or staying asleep, or sleeping too much: 0 Feeling tired or having Jeffery energy: 0 Poor appetite or overeating (PHQ Adolescent also includes...weight loss): 0 Feeling bad about yourself - or that you are a failure or have let yourself or your family down: 0 Trouble concentrating on things, such as reading the newspaper or watching television (PHQ Adolescent also includes...like school work): 0 Moving  or speaking so slowly that other people could have noticed. Or the opposite - being so fidgety or restless that you have been moving around a lot more than usual: 0 Thoughts that you would be better off dead, or of hurting yourself in some way: 0 PHQ-9 Total Score: 0 If you checked off any problems, how difficult have these problems made it for you to do your work, take care of things at home, or get along with other people?: Not difficult at all  Depression Treatment Depression Interventions/Treatment : EYV7-0 Score <4 Follow-up Not Indicated     Goals Addressed             This Visit's Progress    Patient Stated   On track    Eat healthier & continue walking             Objective:     Today's Vitals   10/17/24 1021  BP: 120/80  Weight: 205 lb (93 kg)  Height: 5' 11 (1.803 m)   Body mass index is 28.59 kg/m.  Hearing/Vision screen Hearing Screening - Comments:: No difficulties  Vision Screening - Comments:: Patient sees Dr Jeremiah at [facility] eye care Immunizations and Health Maintenance Health Maintenance  Topic Date Due   Pneumococcal Vaccine: 50+ Years (2 of 2 - PPSV23, PCV20, or PCV21) 09/11/2018   Colonoscopy  02/18/2024   Medicare Annual Wellness (AWV)  06/20/2024   Zoster Vaccines- Shingrix (2 of 2) 09/23/2024   COVID-19 Vaccine (8 - Mixed Product risk 2025-26 season) 01/26/2025   DTaP/Tdap/Td (2 - Td or Tdap) 09/21/2026   Influenza Vaccine  Completed   Hepatitis C Screening  Completed   Meningococcal B Vaccine  Aged Out        Assessment/Plan:  This is a routine wellness examination for Jeffery Thomas.  Patient Care Team: Jerrell Cleatus Ned, MD as PCP - General (Internal Medicine) Court Dorn PARAS, MD as PCP - Cardiology (Cardiology) Cindie Ole DASEN, MD as PCP - Electrophysiology (Cardiology)  I have personally reviewed and noted the following in the patients chart:   Medical and social history Use of alcohol, tobacco or illicit drugs  Current medications and supplements including opioid prescriptions. Functional ability and status Nutritional status Physical activity Advanced directives List of other physicians Hospitalizations, surgeries, and ER visits in previous 12 months Vitals Screenings to include cognitive, depression, and falls Referrals and appointments  No orders of the defined types were placed in this encounter.  In addition, I have reviewed and discussed with patient certain preventive protocols, quality metrics, and best practice recommendations. A written personalized care plan for preventive services as well as general preventive health recommendations were provided to patient.   Jeffery Thomas, [place]   10/17/2024    No follow-ups on file.  After Visit Summary: (MyChart) Due to this being a telephonic visit, the after visit summary with patients personalized plan was offered to patient via MyChart   No voiced or noted concerns at this time  Health Maintenance: will discuss at the next visit

## 2024-10-17 NOTE — Patient Instructions (Signed)
 [name],  Thank you for taking the time for your Medicare Wellness Visit. I appreciate your continued commitment to your health goals. Please review the care plan we discussed, and feel free to reach out if I can assist you further.  Please note that Annual Wellness Visits do not include a physical exam. Some assessments may be limited, especially if the visit was conducted virtually. If needed, we may recommend an in-person follow-up with your provider.  Ongoing Care Seeing your primary care provider every 3 to 6 months helps us  monitor your health and provide consistent, personalized care.   Referrals If a referral was made during today's visit and you haven't received any updates within two weeks, please contact the referred provider directly to check on the status.  Recommended Screenings:  Health Maintenance  Topic Date Due   Pneumococcal Vaccine for age over 59 (2 of 2 - PPSV23, PCV20, or PCV21) 09/11/2018   Colon Cancer Screening  02/18/2024   Medicare Annual Wellness Visit  06/20/2024   Zoster (Shingles) Vaccine (2 of 2) 09/23/2024   COVID-19 Vaccine (8 - Mixed Product risk 2025-26 season) 01/26/2025   DTaP/Tdap/Td vaccine (2 - Td or Tdap) 09/21/2026   Flu Shot  Completed   Hepatitis C Screening  Completed   Meningitis B Vaccine  Aged Out       10/14/2024   11:16 AM  Advanced Directives  Does Patient Have a Medical Advance Directive? Yes  Type of Estate Agent of [place];Living will  Does patient want to make changes to medical advance directive? No - Patient declined  Copy of Healthcare Power of Attorney in Chart? No - copy requested    Vision: Annual vision screenings are recommended for early detection of glaucoma, cataracts, and diabetic retinopathy. These exams can also reveal signs of chronic conditions such as diabetes and high blood pressure.  Dental: Annual dental screenings help detect early signs of oral cancer, gum disease, and other  conditions linked to overall health, including heart disease and diabetes.  Please see the attached documents for additional preventive care recommendations.

## 2024-10-18 ENCOUNTER — Other Ambulatory Visit: Payer: Self-pay | Admitting: Cardiovascular Disease

## 2024-10-21 ENCOUNTER — Ambulatory Visit: Attending: Cardiology

## 2024-10-21 DIAGNOSIS — Z95810 Presence of automatic (implantable) cardiac defibrillator: Secondary | ICD-10-CM

## 2024-10-21 DIAGNOSIS — I5022 Chronic systolic (congestive) heart failure: Secondary | ICD-10-CM

## 2024-10-22 ENCOUNTER — Telehealth: Payer: Self-pay

## 2024-10-22 NOTE — Progress Notes (Cosign Needed)
 EPIC Encounter for ICM Monitoring  Patient Name: Jeffery Thomas is a 73 y.o. male Date: 10/22/2024 Primary Care Physican: Jerrell Cleatus Ned, MD Primary Cardiologist: Court Electrophysiologist: Kennyth    08/21/2024 Office Weight: 211.4 lbs                        Attempted call to patient and unable to reach.   Transmission results reviewed.    Diet:  N/A   Since 09/16/2024 ICM Remote Transmission: [facility] Thoracic impedance suggesting normal fluid levels with the exception of possible fluid accumulation starting 10/14/2024 but trending back toward baseline.    Prescribed: Spironolactone  25 mg take 1 tablet daily       Labs: 08/13/2024 Creatinine 1.16, BUN 16, Potassium 4.1, Sodium 138, GFR 63.49 A complete set of results can be found in Results Review.   Recommendations:   Unable to reach.     Follow-up plan: ICM clinic phone appointment 11/25/2024.  91 day device clinic remote transmission 01/08/2024.      EP/Cardiology Office Visits:  Recall 08/10/2025 with Dr Almetta.  Recall 08/16/2025 with Dr Court.   Copy of ICM check sent to Dr. Kennyth.      Remote monitoring is medically necessary for Heart Failure Management.    Daily Thoracic Impedance ICM trend: 07/23/2024 through 10/21/2024.    12-14 Month Thoracic Impedance ICM trend:     [name], RN 10/22/2024 8:34 AM

## 2024-10-22 NOTE — Telephone Encounter (Signed)
 Remote ICM transmission received.  Attempted call to patient regarding ICM remote transmission and no answer.

## 2024-10-25 ENCOUNTER — Ambulatory Visit: Payer: Self-pay | Admitting: Cardiology

## 2024-10-30 ENCOUNTER — Other Ambulatory Visit: Payer: Self-pay | Admitting: Cardiovascular Disease

## 2024-11-18 ENCOUNTER — Other Ambulatory Visit: Payer: Self-pay | Admitting: Cardiovascular Disease

## 2024-11-25 ENCOUNTER — Ambulatory Visit

## 2024-11-25 DIAGNOSIS — Z95810 Presence of automatic (implantable) cardiac defibrillator: Secondary | ICD-10-CM | POA: Diagnosis not present

## 2024-11-25 DIAGNOSIS — I5022 Chronic systolic (congestive) heart failure: Secondary | ICD-10-CM | POA: Diagnosis not present

## 2024-11-25 NOTE — Progress Notes (Signed)
 EPIC Encounter for ICM Monitoring  Patient Name: Jeffery Thomas is a 74 y.o. male Date: 11/25/2024 Primary Care Physican: Jerrell Cleatus Ned, MD Primary Cardiologist: Court Electrophysiologist: Kennyth    08/21/2024 Office Weight: 211.4 lbs                        Transmission results reviewed.    Diet:  N/A   Since 10/21/2024 ICM Remote Transmission: [facility] Thoracic impedance suggesting intermittent days with possible fluid accumulation.     Prescribed: Spironolactone  25 mg take 1 tablet daily       Labs: 08/13/2024 Creatinine 1.16, BUN 16, Potassium 4.1, Sodium 138, GFR 63.49 A complete set of results can be found in Results Review.   Recommendations:  No changes.     Follow-up plan: ICM clinic phone appointment 12/26/2024.  91 day device clinic remote transmission 01/08/2024.      EP/Cardiology Office Visits:  Recall 08/10/2025 with Dr Almetta.  Recall 08/16/2025 with Dr Court.   Copy of ICM check sent to Dr. Kennyth.      Remote monitoring is medically necessary for Heart Failure Management.    Daily Thoracic Impedance ICM trend: 08/27/2024 through 11/25/2024.    12-14 Month Thoracic Impedance ICM trend:     [name], RN 11/25/2024 3:28 PM

## 2024-12-05 NOTE — Progress Notes (Signed)
 31 day ICM Remote transmission canceled due to [facility] clinic is on hold until further notice.  91 day remote monitoring will continue per protocol.

## 2024-12-26 ENCOUNTER — Ambulatory Visit

## 2025-01-07 ENCOUNTER — Ambulatory Visit: Payer: Medicare Other

## 2025-02-12 ENCOUNTER — Ambulatory Visit: Admitting: Student in an Organized Health Care Education/Training Program
